# Patient Record
Sex: Female | Born: 1945 | Race: White | Hispanic: No | Marital: Single | Smoking: Former smoker
Health system: Southern US, Community
[De-identification: ages and names within clinical notes are randomized; demographics above are authoritative.]

## PROBLEM LIST (undated history)

## (undated) DIAGNOSIS — K5730 Diverticulosis of large intestine without perforation or abscess without bleeding: Secondary | ICD-10-CM

## (undated) DIAGNOSIS — R42 Dizziness and giddiness: Secondary | ICD-10-CM

## (undated) DIAGNOSIS — L820 Inflamed seborrheic keratosis: Secondary | ICD-10-CM

## (undated) DIAGNOSIS — I10 Essential (primary) hypertension: Secondary | ICD-10-CM

## (undated) DIAGNOSIS — Z86010 Personal history of colonic polyps: Secondary | ICD-10-CM

## (undated) DIAGNOSIS — H3530 Unspecified macular degeneration: Secondary | ICD-10-CM

## (undated) DIAGNOSIS — F329 Major depressive disorder, single episode, unspecified: Secondary | ICD-10-CM

## (undated) DIAGNOSIS — H9190 Unspecified hearing loss, unspecified ear: Secondary | ICD-10-CM

## (undated) DIAGNOSIS — R197 Diarrhea, unspecified: Secondary | ICD-10-CM

## (undated) DIAGNOSIS — J45909 Unspecified asthma, uncomplicated: Secondary | ICD-10-CM

## (undated) DIAGNOSIS — E785 Hyperlipidemia, unspecified: Secondary | ICD-10-CM

## (undated) DIAGNOSIS — E119 Type 2 diabetes mellitus without complications: Secondary | ICD-10-CM

## (undated) DIAGNOSIS — J984 Other disorders of lung: Secondary | ICD-10-CM

## (undated) DIAGNOSIS — M5137 Other intervertebral disc degeneration, lumbosacral region: Secondary | ICD-10-CM

## (undated) DIAGNOSIS — D759 Disease of blood and blood-forming organs, unspecified: Secondary | ICD-10-CM

## (undated) HISTORY — DX: Unspecified hearing loss, unspecified ear: H91.90

## (undated) HISTORY — DX: Dizziness and giddiness: R42

## (undated) HISTORY — DX: Essential (primary) hypertension: I10

## (undated) HISTORY — DX: Personal history of colonic polyps: Z86.010

## (undated) HISTORY — DX: Type 2 diabetes mellitus without complications: E11.9

## (undated) HISTORY — DX: Unspecified asthma, uncomplicated: J45.909

## (undated) HISTORY — DX: Unspecified macular degeneration: H35.30

## (undated) HISTORY — DX: Hyperlipidemia, unspecified: E78.5

## (undated) HISTORY — DX: Diarrhea, unspecified: R19.7

## (undated) HISTORY — DX: Other intervertebral disc degeneration, lumbosacral region: M51.37

## (undated) HISTORY — DX: Major depressive disorder, single episode, unspecified: F32.9

## (undated) HISTORY — DX: Inflamed seborrheic keratosis: L82.0

## (undated) HISTORY — DX: Diverticulosis of large intestine without perforation or abscess without bleeding: K57.30

---
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
## 1980-11-06 HISTORY — PX: CHOLECYSTECTOMY: SHX55

## 1993-11-06 HISTORY — PX: KNEE ARTHROSCOPY: SUR90

## 1994-11-06 HISTORY — PX: SPINE SURGERY: SHX786

## 1996-11-06 HISTORY — PX: JOINT REPLACEMENT: SHX530

## 1998-04-20 ENCOUNTER — Encounter: Admission: RE | Admit: 1998-04-20 | Discharge: 1998-07-19 | Payer: Self-pay | Admitting: Anesthesiology

## 1998-06-22 ENCOUNTER — Ambulatory Visit (HOSPITAL_COMMUNITY): Admission: RE | Admit: 1998-06-22 | Discharge: 1998-06-22 | Payer: Self-pay | Admitting: *Deleted

## 1999-04-15 ENCOUNTER — Other Ambulatory Visit: Admission: RE | Admit: 1999-04-15 | Discharge: 1999-04-15 | Payer: Self-pay | Admitting: *Deleted

## 2000-06-21 ENCOUNTER — Encounter: Admission: RE | Admit: 2000-06-21 | Discharge: 2000-08-16 | Payer: Self-pay | Admitting: Podiatry

## 2000-07-19 ENCOUNTER — Other Ambulatory Visit: Admission: RE | Admit: 2000-07-19 | Discharge: 2000-07-19 | Payer: Self-pay | Admitting: *Deleted

## 2001-01-11 ENCOUNTER — Other Ambulatory Visit: Admission: RE | Admit: 2001-01-11 | Discharge: 2001-01-11 | Payer: Self-pay | Admitting: *Deleted

## 2001-04-18 ENCOUNTER — Other Ambulatory Visit: Admission: RE | Admit: 2001-04-18 | Discharge: 2001-04-18 | Payer: Self-pay | Admitting: Internal Medicine

## 2001-04-18 ENCOUNTER — Encounter (INDEPENDENT_AMBULATORY_CARE_PROVIDER_SITE_OTHER): Payer: Self-pay | Admitting: Specialist

## 2001-07-25 ENCOUNTER — Other Ambulatory Visit: Admission: RE | Admit: 2001-07-25 | Discharge: 2001-07-25 | Payer: Self-pay | Admitting: *Deleted

## 2002-07-22 ENCOUNTER — Other Ambulatory Visit: Admission: RE | Admit: 2002-07-22 | Discharge: 2002-07-22 | Payer: Self-pay | Admitting: *Deleted

## 2002-07-26 ENCOUNTER — Emergency Department (HOSPITAL_COMMUNITY): Admission: EM | Admit: 2002-07-26 | Discharge: 2002-07-26 | Payer: Self-pay

## 2002-07-26 ENCOUNTER — Encounter: Payer: Self-pay | Admitting: Emergency Medicine

## 2002-09-02 ENCOUNTER — Encounter: Payer: Self-pay | Admitting: Neurosurgery

## 2002-09-02 ENCOUNTER — Ambulatory Visit (HOSPITAL_COMMUNITY): Admission: RE | Admit: 2002-09-02 | Discharge: 2002-09-02 | Payer: Self-pay | Admitting: Neurosurgery

## 2003-08-07 ENCOUNTER — Other Ambulatory Visit: Admission: RE | Admit: 2003-08-07 | Discharge: 2003-08-07 | Payer: Self-pay | Admitting: *Deleted

## 2003-11-07 ENCOUNTER — Encounter: Payer: Self-pay | Admitting: Family Medicine

## 2004-04-12 ENCOUNTER — Encounter: Payer: Self-pay | Admitting: Internal Medicine

## 2004-04-25 ENCOUNTER — Encounter: Admission: RE | Admit: 2004-04-25 | Discharge: 2004-04-25 | Payer: Self-pay | Admitting: Family Medicine

## 2005-04-05 ENCOUNTER — Ambulatory Visit: Payer: Self-pay | Admitting: Family Medicine

## 2005-04-25 ENCOUNTER — Ambulatory Visit: Payer: Self-pay | Admitting: Family Medicine

## 2005-05-16 ENCOUNTER — Encounter: Admission: RE | Admit: 2005-05-16 | Discharge: 2005-08-14 | Payer: Self-pay | Admitting: Family Medicine

## 2005-05-22 ENCOUNTER — Ambulatory Visit: Payer: Self-pay | Admitting: Family Medicine

## 2005-07-12 ENCOUNTER — Ambulatory Visit: Payer: Self-pay | Admitting: Family Medicine

## 2005-07-21 ENCOUNTER — Ambulatory Visit: Payer: Self-pay | Admitting: Family Medicine

## 2005-07-27 ENCOUNTER — Ambulatory Visit: Payer: Self-pay | Admitting: Family Medicine

## 2006-04-16 ENCOUNTER — Ambulatory Visit: Payer: Self-pay | Admitting: Family Medicine

## 2006-05-04 ENCOUNTER — Ambulatory Visit: Payer: Self-pay | Admitting: Family Medicine

## 2006-05-16 ENCOUNTER — Ambulatory Visit: Payer: Self-pay | Admitting: Family Medicine

## 2006-08-30 ENCOUNTER — Ambulatory Visit: Payer: Self-pay | Admitting: Family Medicine

## 2006-10-02 ENCOUNTER — Ambulatory Visit: Payer: Self-pay | Admitting: Family Medicine

## 2006-10-18 ENCOUNTER — Ambulatory Visit: Payer: Self-pay | Admitting: Family Medicine

## 2006-12-25 ENCOUNTER — Ambulatory Visit: Payer: Self-pay | Admitting: Family Medicine

## 2007-01-10 ENCOUNTER — Emergency Department (HOSPITAL_COMMUNITY): Admission: EM | Admit: 2007-01-10 | Discharge: 2007-01-10 | Payer: Self-pay | Admitting: Emergency Medicine

## 2007-01-11 ENCOUNTER — Ambulatory Visit: Payer: Self-pay | Admitting: Family Medicine

## 2007-01-11 LAB — CONVERTED CEMR LAB
BUN: 14 mg/dL (ref 6–23)
CO2: 34 meq/L — ABNORMAL HIGH (ref 19–32)
GFR calc Af Amer: 82 mL/min
GFR calc non Af Amer: 68 mL/min
Glucose, Bld: 137 mg/dL — ABNORMAL HIGH (ref 70–99)
Potassium: 5 meq/L (ref 3.5–5.1)
Sodium: 137 meq/L (ref 135–145)

## 2007-01-14 ENCOUNTER — Ambulatory Visit: Payer: Self-pay | Admitting: Cardiology

## 2007-01-18 ENCOUNTER — Ambulatory Visit (HOSPITAL_COMMUNITY): Admission: RE | Admit: 2007-01-18 | Discharge: 2007-01-18 | Payer: Self-pay | Admitting: Family Medicine

## 2007-01-19 ENCOUNTER — Emergency Department (HOSPITAL_COMMUNITY): Admission: EM | Admit: 2007-01-19 | Discharge: 2007-01-19 | Payer: Self-pay | Admitting: *Deleted

## 2007-01-22 ENCOUNTER — Ambulatory Visit: Payer: Self-pay | Admitting: Thoracic Surgery

## 2007-03-14 ENCOUNTER — Encounter: Payer: Self-pay | Admitting: Family Medicine

## 2007-03-14 DIAGNOSIS — I10 Essential (primary) hypertension: Secondary | ICD-10-CM

## 2007-03-14 DIAGNOSIS — J4489 Other specified chronic obstructive pulmonary disease: Secondary | ICD-10-CM | POA: Insufficient documentation

## 2007-03-14 DIAGNOSIS — F325 Major depressive disorder, single episode, in full remission: Secondary | ICD-10-CM

## 2007-03-14 DIAGNOSIS — F329 Major depressive disorder, single episode, unspecified: Secondary | ICD-10-CM

## 2007-03-14 DIAGNOSIS — E1159 Type 2 diabetes mellitus with other circulatory complications: Secondary | ICD-10-CM

## 2007-03-14 DIAGNOSIS — E119 Type 2 diabetes mellitus without complications: Secondary | ICD-10-CM | POA: Insufficient documentation

## 2007-03-14 DIAGNOSIS — F321 Major depressive disorder, single episode, moderate: Secondary | ICD-10-CM | POA: Insufficient documentation

## 2007-03-14 DIAGNOSIS — J45909 Unspecified asthma, uncomplicated: Secondary | ICD-10-CM

## 2007-03-14 DIAGNOSIS — I152 Hypertension secondary to endocrine disorders: Secondary | ICD-10-CM | POA: Insufficient documentation

## 2007-03-14 DIAGNOSIS — F3289 Other specified depressive episodes: Secondary | ICD-10-CM

## 2007-03-14 DIAGNOSIS — J4520 Mild intermittent asthma, uncomplicated: Secondary | ICD-10-CM | POA: Insufficient documentation

## 2007-03-14 DIAGNOSIS — H3530 Unspecified macular degeneration: Secondary | ICD-10-CM

## 2007-03-14 DIAGNOSIS — J449 Chronic obstructive pulmonary disease, unspecified: Secondary | ICD-10-CM | POA: Insufficient documentation

## 2007-03-14 HISTORY — DX: Essential (primary) hypertension: I10

## 2007-03-14 HISTORY — DX: Type 2 diabetes mellitus without complications: E11.9

## 2007-03-14 HISTORY — DX: Major depressive disorder, single episode, unspecified: F32.9

## 2007-03-14 HISTORY — DX: Other specified depressive episodes: F32.89

## 2007-03-14 HISTORY — DX: Unspecified asthma, uncomplicated: J45.909

## 2007-03-14 HISTORY — DX: Unspecified macular degeneration: H35.30

## 2007-03-27 ENCOUNTER — Ambulatory Visit: Payer: Self-pay | Admitting: Family Medicine

## 2007-03-27 LAB — CONVERTED CEMR LAB
Cholesterol: 188 mg/dL (ref 0–200)
Creatinine, Ser: 0.7 mg/dL (ref 0.4–1.2)
Creatinine,U: 181.6 mg/dL
Eosinophils Relative: 2 % (ref 0.0–5.0)
Glucose, Bld: 143 mg/dL — ABNORMAL HIGH (ref 70–99)
HCT: 43 % (ref 36.0–46.0)
HDL: 56.2 mg/dL (ref >39.0)
LDL Cholesterol: 107 mg/dL — ABNORMAL HIGH (ref 0–99)
Lymphocytes Relative: 26.9 % (ref 12.0–46.0)
Neutro Abs: 4.1 10*3/uL (ref 1.4–7.7)
Neutrophils Relative %: 62.1 % (ref 43.0–77.0)
Platelets: 206 10*3/uL (ref 150–400)
Sodium: 140 meq/L (ref 135–145)
TSH: 2.7 microintl units/mL (ref 0.35–5.50)
Triglycerides: 124 mg/dL (ref 0–149)
VLDL: 25 mg/dL (ref 0–40)
WBC: 6.5 10*3/uL (ref 4.5–10.5)

## 2007-03-28 ENCOUNTER — Ambulatory Visit: Payer: Self-pay | Admitting: Family Medicine

## 2007-04-03 ENCOUNTER — Encounter: Admission: RE | Admit: 2007-04-03 | Discharge: 2007-04-03 | Payer: Self-pay | Admitting: Thoracic Surgery

## 2007-04-03 ENCOUNTER — Ambulatory Visit: Payer: Self-pay | Admitting: Thoracic Surgery

## 2007-05-03 ENCOUNTER — Ambulatory Visit: Payer: Self-pay | Admitting: Infectious Diseases

## 2007-05-03 ENCOUNTER — Inpatient Hospital Stay (HOSPITAL_COMMUNITY): Admission: RE | Admit: 2007-05-03 | Discharge: 2007-05-08 | Payer: Self-pay | Admitting: Orthopedic Surgery

## 2007-05-09 ENCOUNTER — Telehealth: Payer: Self-pay | Admitting: Family Medicine

## 2007-05-31 ENCOUNTER — Ambulatory Visit: Payer: Self-pay | Admitting: Internal Medicine

## 2007-05-31 ENCOUNTER — Telehealth: Payer: Self-pay | Admitting: Internal Medicine

## 2007-05-31 DIAGNOSIS — R197 Diarrhea, unspecified: Secondary | ICD-10-CM | POA: Insufficient documentation

## 2007-05-31 HISTORY — DX: Diarrhea, unspecified: R19.7

## 2007-05-31 LAB — CONVERTED CEMR LAB
ALT: 13 units/L (ref 0–35)
Albumin: 3.8 g/dL (ref 3.5–5.2)
Alkaline Phosphatase: 133 units/L — ABNORMAL HIGH (ref 39–117)
BUN: 13 mg/dL (ref 6–23)
CO2: 27 meq/L (ref 19–32)
Chloride: 96 meq/L (ref 96–112)
Creatinine, Ser: 0.95 mg/dL (ref 0.40–1.20)
HCT: 39.9 % (ref 36.0–46.0)
Hemoglobin: 13.1 g/dL (ref 12.0–15.0)
MCHC: 32.7 g/dL (ref 30.0–36.0)
MCV: 86.7 fL (ref 78.0–100.0)
Platelets: 210 10*3/uL (ref 150–400)
Potassium: 4.2 meq/L (ref 3.5–5.3)
RBC: 4.61 M/uL (ref 3.87–5.11)
Total Bilirubin: 0.6 mg/dL (ref 0.3–1.2)
Total Protein: 6.1 g/dL (ref 6.0–8.3)

## 2007-06-02 ENCOUNTER — Inpatient Hospital Stay (HOSPITAL_COMMUNITY): Admission: AD | Admit: 2007-06-02 | Discharge: 2007-06-06 | Payer: Self-pay | Admitting: Internal Medicine

## 2007-06-02 ENCOUNTER — Ambulatory Visit: Payer: Self-pay | Admitting: Infectious Disease

## 2007-06-04 ENCOUNTER — Ambulatory Visit: Payer: Self-pay | Admitting: Internal Medicine

## 2007-06-18 ENCOUNTER — Ambulatory Visit: Payer: Self-pay | Admitting: Family Medicine

## 2007-07-22 ENCOUNTER — Inpatient Hospital Stay (HOSPITAL_COMMUNITY): Admission: RE | Admit: 2007-07-22 | Discharge: 2007-07-25 | Payer: Self-pay | Admitting: Orthopedic Surgery

## 2007-07-22 ENCOUNTER — Ambulatory Visit: Payer: Self-pay | Admitting: Internal Medicine

## 2007-07-25 ENCOUNTER — Ambulatory Visit: Payer: Self-pay | Admitting: Vascular Surgery

## 2007-07-25 ENCOUNTER — Encounter (INDEPENDENT_AMBULATORY_CARE_PROVIDER_SITE_OTHER): Payer: Self-pay | Admitting: Orthopedic Surgery

## 2007-10-08 ENCOUNTER — Telehealth: Payer: Self-pay | Admitting: Family Medicine

## 2007-10-23 ENCOUNTER — Ambulatory Visit: Payer: Self-pay | Admitting: Family Medicine

## 2007-10-30 ENCOUNTER — Encounter: Admission: RE | Admit: 2007-10-30 | Discharge: 2007-10-30 | Payer: Self-pay | Admitting: Thoracic Surgery

## 2007-10-30 ENCOUNTER — Ambulatory Visit: Payer: Self-pay | Admitting: Thoracic Surgery

## 2007-11-05 ENCOUNTER — Encounter: Payer: Self-pay | Admitting: Family Medicine

## 2007-12-11 ENCOUNTER — Telehealth: Payer: Self-pay | Admitting: Family Medicine

## 2007-12-12 ENCOUNTER — Telehealth: Payer: Self-pay | Admitting: Family Medicine

## 2008-03-12 ENCOUNTER — Ambulatory Visit: Payer: Self-pay | Admitting: Family Medicine

## 2008-03-12 LAB — CONVERTED CEMR LAB
AST: 18 units/L (ref 0–37)
Basophils Relative: 0.5 % (ref 0.0–1.0)
Bilirubin Urine: NEGATIVE
Bilirubin, Direct: 0.1 mg/dL (ref 0.0–0.3)
CO2: 31 meq/L (ref 19–32)
Calcium: 9.9 mg/dL (ref 8.4–10.5)
Chloride: 102 meq/L (ref 96–112)
Eosinophils Absolute: 0.2 10*3/uL (ref 0.0–0.7)
Eosinophils Relative: 3.2 % (ref 0.0–5.0)
GFR calc Af Amer: 82 mL/min
LDL Cholesterol: 84 mg/dL (ref 0–99)
Lymphocytes Relative: 30.9 % (ref 12.0–46.0)
MCHC: 33 g/dL (ref 30.0–36.0)
Microalb Creat Ratio: 17.3 mg/g (ref 0.0–30.0)
Microalb, Ur: 2.7 mg/dL — ABNORMAL HIGH (ref 0.0–1.9)
Monocytes Absolute: 0.6 10*3/uL (ref 0.1–1.0)
Monocytes Relative: 9.2 % (ref 3.0–12.0)
Neutrophils Relative %: 56.2 % (ref 43.0–77.0)
Potassium: 4.4 meq/L (ref 3.5–5.1)
Protein, U semiquant: NEGATIVE
RDW: 13.9 % (ref 11.5–14.6)
Sodium: 141 meq/L (ref 135–145)
Total Bilirubin: 0.9 mg/dL (ref 0.3–1.2)
Total CHOL/HDL Ratio: 3
Total Protein: 6.7 g/dL (ref 6.0–8.3)

## 2008-03-18 ENCOUNTER — Ambulatory Visit: Payer: Self-pay | Admitting: Family Medicine

## 2008-03-23 ENCOUNTER — Telehealth: Payer: Self-pay | Admitting: *Deleted

## 2008-03-26 ENCOUNTER — Telehealth: Payer: Self-pay | Admitting: Family Medicine

## 2008-03-27 ENCOUNTER — Telehealth (INDEPENDENT_AMBULATORY_CARE_PROVIDER_SITE_OTHER): Payer: Self-pay | Admitting: *Deleted

## 2008-03-27 ENCOUNTER — Telehealth: Payer: Self-pay | Admitting: *Deleted

## 2008-04-09 ENCOUNTER — Telehealth: Payer: Self-pay | Admitting: *Deleted

## 2008-04-29 ENCOUNTER — Encounter: Admission: RE | Admit: 2008-04-29 | Discharge: 2008-04-29 | Payer: Self-pay | Admitting: Thoracic Surgery

## 2008-04-30 ENCOUNTER — Ambulatory Visit: Payer: Self-pay | Admitting: Thoracic Surgery

## 2008-05-12 ENCOUNTER — Telehealth: Payer: Self-pay | Admitting: Family Medicine

## 2008-06-19 IMAGING — CT NM PET TUM IMG SKULL BASE T - THIGH
6 series · 25 of 25 positions shown · IV contrast (OM)
Comparison: Chest CT 01/14/2007

CLINICAL DATA: Left upper lobe nodule

PELVIS CT WITHOUT CONTRAST
TECHNIQUE: Multidetector CT imaging of the pelvis was performed following the
standard protocol without IV contrast.  Oral contrast was administered.

[Series 1: pet ac · axial · 3.3mm · 4.69mm/px · z∈[-914,-44]mm · 5 of 267 slices shown]
[im 1/267]
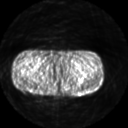
[im 67/267]
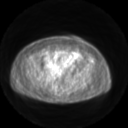
[im 134/267]
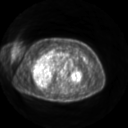
[im 200/267]
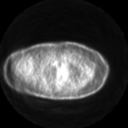
[im 267/267]
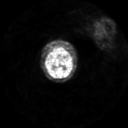

[Series 2: ct images · axial · 3.8mm · 0.98mm/px · z∈[-914,-44]mm · 6 of 266 slices shown]
[im 1/266]
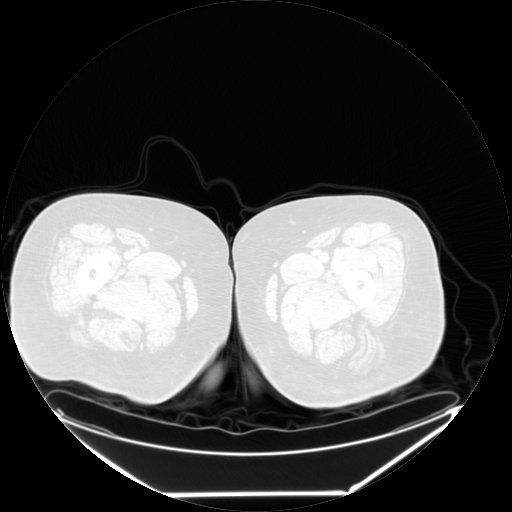
[im 54/266]
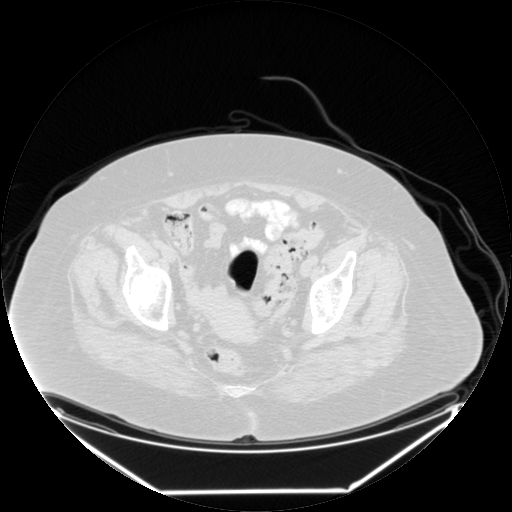
[im 107/266]
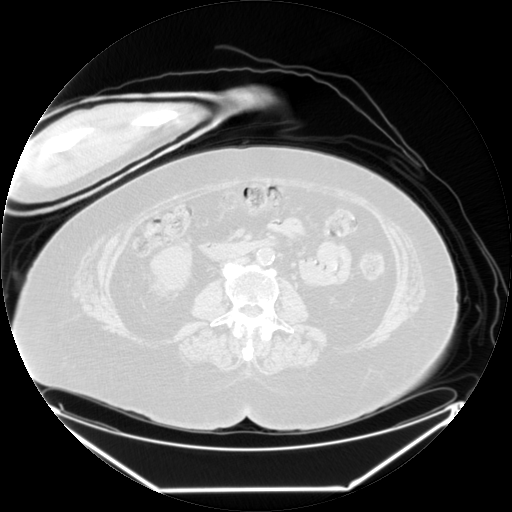
[im 160/266]
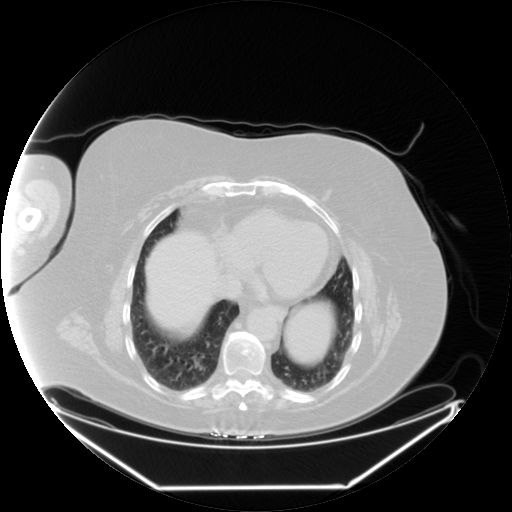
[im 213/266]
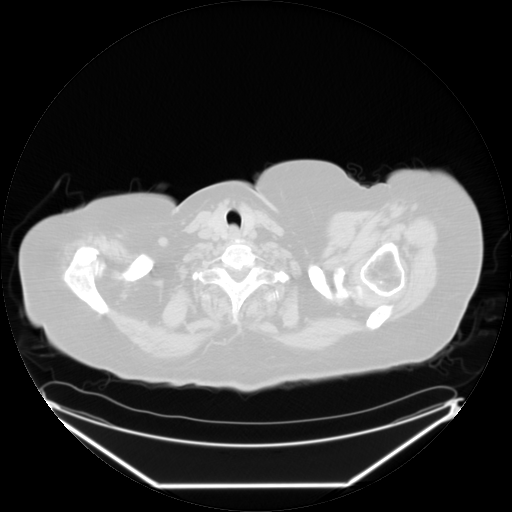
[im 266/266  brain]
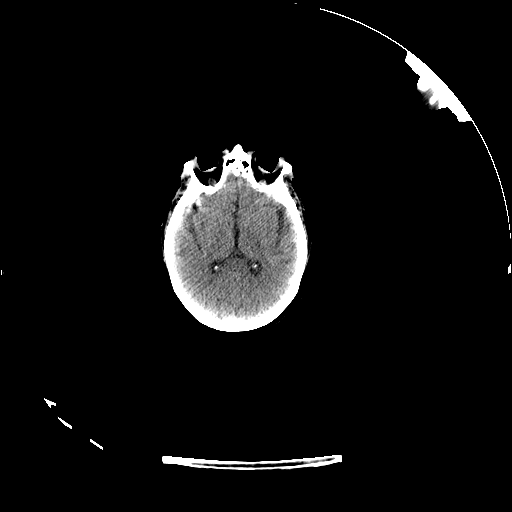

[Series 2: pet nac · axial · 3.3mm · 4.69mm/px · z∈[-914,-44]mm · 6 of 267 slices shown]
[im 1/267]
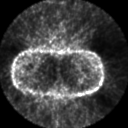
[im 54/267]
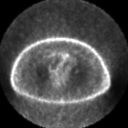
[im 107/267]
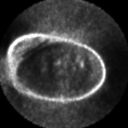
[im 160/267]
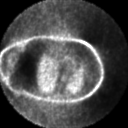
[im 213/267]
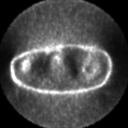
[im 267/267]
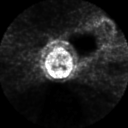

[Series 123: mip · coronal · 3.3mm · 4.69mm/px · 1 of 30 slices shown]
[im 1/30]
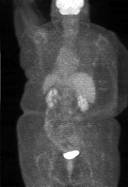

[Series 151: reformatted · axial · 3.3mm · 3.91mm/px · z∈[-904,-54]mm · 6 of 259 slices shown (1 of 2)]
[im 1/259]
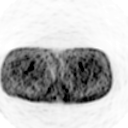
[im 52/259]
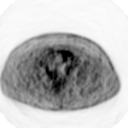
[im 104/259]
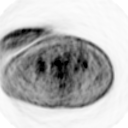
[im 155/259]
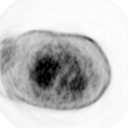
[im 207/259]
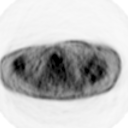
[im 259/259]
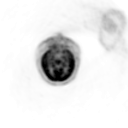

[Series 153: reformatted · coronal · 4.7mm · 6.98mm/px · 1 of 65 slices shown (2 of 2)]
[im 1/65]
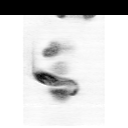

[25 of 25 positions shown; findings below may reference images not displayed]

FINDINGS: Again seen is the 1.2 cm posterior inferior left upper lobe nodule,
along the major fissure. This shows no significant abnormal FDG accumulation,
with a maximum SUV measuring 0.8, equivalent to background level. No abnormal
metabolic activity seen within the neck, chest, abdomen, or pelvis.

IMPRESSION

No significant increased metabolic activity with in the left upper lobe nodule.
Although this is encouraging, given its small size, I would recommend followup
with repeat chest CT in 6-12 months to assure stability in size.

## 2008-07-20 ENCOUNTER — Ambulatory Visit: Payer: Self-pay | Admitting: Family Medicine

## 2008-07-20 LAB — CONVERTED CEMR LAB
GFR calc Af Amer: 93 mL/min
Hgb A1c MFr Bld: 7.4 % — ABNORMAL HIGH (ref 4.6–6.0)

## 2008-07-24 ENCOUNTER — Ambulatory Visit: Payer: Self-pay | Admitting: Family Medicine

## 2008-08-10 ENCOUNTER — Telehealth (INDEPENDENT_AMBULATORY_CARE_PROVIDER_SITE_OTHER): Payer: Self-pay | Admitting: *Deleted

## 2008-09-01 ENCOUNTER — Telehealth: Payer: Self-pay | Admitting: Family Medicine

## 2008-09-21 ENCOUNTER — Telehealth: Payer: Self-pay | Admitting: Family Medicine

## 2008-09-29 ENCOUNTER — Telehealth: Payer: Self-pay | Admitting: *Deleted

## 2008-10-16 ENCOUNTER — Ambulatory Visit: Payer: Self-pay | Admitting: Family Medicine

## 2008-10-16 LAB — CONVERTED CEMR LAB
CO2: 30 meq/L (ref 19–32)
Calcium: 9.6 mg/dL (ref 8.4–10.5)
Chloride: 102 meq/L (ref 96–112)
Creatinine, Ser: 0.8 mg/dL (ref 0.4–1.2)
GFR calc Af Amer: 93 mL/min
GFR calc non Af Amer: 77 mL/min
Hgb A1c MFr Bld: 6.8 % — ABNORMAL HIGH (ref 4.6–6.0)

## 2008-10-21 ENCOUNTER — Ambulatory Visit: Payer: Self-pay | Admitting: Thoracic Surgery

## 2008-10-21 ENCOUNTER — Encounter: Admission: RE | Admit: 2008-10-21 | Discharge: 2008-10-21 | Payer: Self-pay | Admitting: Thoracic Surgery

## 2008-10-23 ENCOUNTER — Ambulatory Visit: Payer: Self-pay | Admitting: Family Medicine

## 2008-11-16 ENCOUNTER — Encounter: Payer: Self-pay | Admitting: Family Medicine

## 2009-01-11 DIAGNOSIS — K5730 Diverticulosis of large intestine without perforation or abscess without bleeding: Secondary | ICD-10-CM

## 2009-01-11 DIAGNOSIS — Z860100 Personal history of colon polyps, unspecified: Secondary | ICD-10-CM | POA: Insufficient documentation

## 2009-01-11 HISTORY — DX: Personal history of colon polyps, unspecified: Z86.0100

## 2009-01-11 HISTORY — DX: Diverticulosis of large intestine without perforation or abscess without bleeding: K57.30

## 2009-01-11 HISTORY — DX: Personal history of colonic polyps: Z86.010

## 2009-01-13 ENCOUNTER — Ambulatory Visit: Payer: Self-pay | Admitting: Internal Medicine

## 2009-02-19 ENCOUNTER — Telehealth: Payer: Self-pay | Admitting: Family Medicine

## 2009-02-26 ENCOUNTER — Ambulatory Visit: Payer: Self-pay | Admitting: Internal Medicine

## 2009-02-26 ENCOUNTER — Encounter: Payer: Self-pay | Admitting: Internal Medicine

## 2009-03-01 ENCOUNTER — Encounter: Payer: Self-pay | Admitting: Internal Medicine

## 2009-03-02 ENCOUNTER — Ambulatory Visit: Payer: Self-pay | Admitting: Family Medicine

## 2009-03-02 LAB — CONVERTED CEMR LAB
AST: 15 units/L (ref 0–37)
Alkaline Phosphatase: 105 units/L (ref 39–117)
Basophils Relative: 1.4 % (ref 0.0–3.0)
Bilirubin Urine: NEGATIVE
Calcium: 9.6 mg/dL (ref 8.4–10.5)
Cholesterol: 168 mg/dL (ref 0–200)
Creatinine, Ser: 0.8 mg/dL (ref 0.4–1.2)
Eosinophils Absolute: 0.2 10*3/uL (ref 0.0–0.7)
GFR calc non Af Amer: 77.06 mL/min (ref >60)
Glucose, Bld: 137 mg/dL — ABNORMAL HIGH (ref 70–99)
HCT: 41.8 % (ref 36.0–46.0)
Hemoglobin: 14.2 g/dL (ref 12.0–15.0)
Hgb A1c MFr Bld: 6.9 % — ABNORMAL HIGH (ref 4.6–6.5)
Lymphocytes Relative: 31.8 % (ref 12.0–46.0)
Lymphs Abs: 1.9 10*3/uL (ref 0.7–4.0)
MCHC: 34 g/dL (ref 30.0–36.0)
MCV: 90.3 fL (ref 78.0–100.0)
Neutro Abs: 3.1 10*3/uL (ref 1.4–7.7)
Potassium: 4.2 meq/L (ref 3.5–5.1)
RBC: 4.63 M/uL (ref 3.87–5.11)
RDW: 13 % (ref 11.5–14.6)
Sodium: 145 meq/L (ref 135–145)
Specific Gravity, Urine: 1.02 (ref 1.000–1.030)
Total Bilirubin: 0.9 mg/dL (ref 0.3–1.2)
Total Protein, Urine: NEGATIVE mg/dL
Total Protein: 6.5 g/dL (ref 6.0–8.3)
Triglycerides: 111 mg/dL (ref 0.0–149.0)
Urobilinogen, UA: 0.2 (ref 0.0–1.0)
WBC: 5.9 10*3/uL (ref 4.5–10.5)

## 2009-03-04 ENCOUNTER — Telehealth: Payer: Self-pay | Admitting: Internal Medicine

## 2009-03-08 ENCOUNTER — Encounter: Payer: Self-pay | Admitting: Family Medicine

## 2009-03-09 ENCOUNTER — Ambulatory Visit: Payer: Self-pay | Admitting: Family Medicine

## 2009-03-09 LAB — CONVERTED CEMR LAB: Microalb Creat Ratio: 11.2 mg/g (ref 0.0–30.0)

## 2009-03-15 ENCOUNTER — Telehealth: Payer: Self-pay | Admitting: Family Medicine

## 2009-05-25 ENCOUNTER — Ambulatory Visit: Payer: Self-pay | Admitting: Family Medicine

## 2009-05-25 DIAGNOSIS — M51379 Other intervertebral disc degeneration, lumbosacral region without mention of lumbar back pain or lower extremity pain: Secondary | ICD-10-CM

## 2009-05-25 DIAGNOSIS — M5137 Other intervertebral disc degeneration, lumbosacral region: Secondary | ICD-10-CM | POA: Insufficient documentation

## 2009-05-25 HISTORY — DX: Other intervertebral disc degeneration, lumbosacral region without mention of lumbar back pain or lower extremity pain: M51.379

## 2009-05-25 HISTORY — DX: Other intervertebral disc degeneration, lumbosacral region: M51.37

## 2009-06-30 ENCOUNTER — Ambulatory Visit: Payer: Self-pay | Admitting: Thoracic Surgery

## 2009-06-30 ENCOUNTER — Encounter: Admission: RE | Admit: 2009-06-30 | Discharge: 2009-06-30 | Payer: Self-pay | Admitting: Thoracic Surgery

## 2009-08-11 ENCOUNTER — Ambulatory Visit: Payer: Self-pay | Admitting: Family Medicine

## 2009-09-14 ENCOUNTER — Telehealth: Payer: Self-pay | Admitting: Family Medicine

## 2009-10-26 ENCOUNTER — Encounter: Payer: Self-pay | Admitting: *Deleted

## 2009-11-17 LAB — HM MAMMOGRAPHY: HM Mammogram: NORMAL

## 2009-11-25 ENCOUNTER — Encounter: Payer: Self-pay | Admitting: Family Medicine

## 2009-11-25 ENCOUNTER — Telehealth: Payer: Self-pay | Admitting: *Deleted

## 2009-11-30 ENCOUNTER — Telehealth: Payer: Self-pay | Admitting: Family Medicine

## 2009-12-02 ENCOUNTER — Telehealth: Payer: Self-pay | Admitting: Family Medicine

## 2009-12-03 ENCOUNTER — Ambulatory Visit: Payer: Self-pay | Admitting: Family Medicine

## 2009-12-04 ENCOUNTER — Encounter: Payer: Self-pay | Admitting: Family Medicine

## 2010-01-13 ENCOUNTER — Ambulatory Visit: Payer: Self-pay | Admitting: Family Medicine

## 2010-01-13 DIAGNOSIS — H9190 Unspecified hearing loss, unspecified ear: Secondary | ICD-10-CM

## 2010-01-13 HISTORY — DX: Unspecified hearing loss, unspecified ear: H91.90

## 2010-01-14 ENCOUNTER — Telehealth: Payer: Self-pay | Admitting: Family Medicine

## 2010-03-07 ENCOUNTER — Ambulatory Visit: Payer: Self-pay | Admitting: Family Medicine

## 2010-03-07 LAB — CONVERTED CEMR LAB
ALT: 17 units/L (ref 0–35)
Albumin: 3.7 g/dL (ref 3.5–5.2)
Basophils Absolute: 0 10*3/uL (ref 0.0–0.1)
Bilirubin, Direct: 0.2 mg/dL (ref 0.0–0.3)
Calcium: 9.6 mg/dL (ref 8.4–10.5)
Cholesterol: 174 mg/dL (ref 0–200)
Eosinophils Absolute: 0.1 10*3/uL (ref 0.0–0.7)
GFR calc non Af Amer: 67.05 mL/min (ref >60)
HCT: 38.3 % (ref 36.0–46.0)
HDL: 53.1 mg/dL (ref >39.00)
Hemoglobin: 13.4 g/dL (ref 12.0–15.0)
Ketones, ur: NEGATIVE mg/dL
LDL Cholesterol: 93 mg/dL (ref 0–99)
Lymphs Abs: 1.7 10*3/uL (ref 0.7–4.0)
Monocytes Absolute: 0.4 10*3/uL (ref 0.1–1.0)
Monocytes Relative: 8.5 % (ref 3.0–12.0)
Neutro Abs: 2.8 10*3/uL (ref 1.4–7.7)
Neutrophils Relative %: 54.8 % (ref 43.0–77.0)
Nitrite: NEGATIVE
Platelets: 140 10*3/uL — ABNORMAL LOW (ref 150.0–400.0)
RDW: 15.2 % — ABNORMAL HIGH (ref 11.5–14.6)
TSH: 2.28 microintl units/mL (ref 0.35–5.50)
Total Bilirubin: 0.9 mg/dL (ref 0.3–1.2)
Total Protein: 5.5 g/dL — ABNORMAL LOW (ref 6.0–8.3)
Triglycerides: 142 mg/dL (ref 0.0–149.0)
Urobilinogen, UA: 0.2 (ref 0.0–1.0)
VLDL: 28.4 mg/dL (ref 0.0–40.0)
pH: 5 (ref 5.0–8.0)

## 2010-03-14 ENCOUNTER — Ambulatory Visit: Payer: Self-pay | Admitting: Family Medicine

## 2010-03-17 LAB — HM DIABETES FOOT EXAM

## 2010-03-25 ENCOUNTER — Telehealth: Payer: Self-pay | Admitting: Family Medicine

## 2010-03-26 ENCOUNTER — Emergency Department (HOSPITAL_COMMUNITY): Admission: EM | Admit: 2010-03-26 | Discharge: 2010-03-26 | Payer: Self-pay | Admitting: Family Medicine

## 2010-04-06 ENCOUNTER — Ambulatory Visit: Payer: Self-pay | Admitting: Thoracic Surgery

## 2010-04-06 ENCOUNTER — Encounter: Admission: RE | Admit: 2010-04-06 | Discharge: 2010-04-06 | Payer: Self-pay | Admitting: Thoracic Surgery

## 2010-05-06 ENCOUNTER — Encounter: Admission: RE | Admit: 2010-05-06 | Discharge: 2010-05-06 | Payer: Self-pay | Admitting: Otolaryngology

## 2010-06-14 ENCOUNTER — Ambulatory Visit: Payer: Self-pay | Admitting: Family Medicine

## 2010-06-14 LAB — CONVERTED CEMR LAB
CO2: 29 meq/L (ref 19–32)
Calcium: 9.9 mg/dL (ref 8.4–10.5)
Creatinine, Ser: 0.9 mg/dL (ref 0.4–1.2)
GFR calc non Af Amer: 69.66 mL/min (ref >60)

## 2010-06-21 ENCOUNTER — Ambulatory Visit: Payer: Self-pay | Admitting: Family Medicine

## 2010-06-21 DIAGNOSIS — E785 Hyperlipidemia, unspecified: Secondary | ICD-10-CM

## 2010-06-21 DIAGNOSIS — E1169 Type 2 diabetes mellitus with other specified complication: Secondary | ICD-10-CM | POA: Insufficient documentation

## 2010-06-21 HISTORY — DX: Hyperlipidemia, unspecified: E78.5

## 2010-09-16 ENCOUNTER — Telehealth: Payer: Self-pay | Admitting: Family Medicine

## 2010-10-10 ENCOUNTER — Telehealth: Payer: Self-pay | Admitting: Family Medicine

## 2010-11-03 ENCOUNTER — Telehealth: Payer: Self-pay | Admitting: Family Medicine

## 2010-11-06 HISTORY — PX: OTHER SURGICAL HISTORY: SHX169

## 2010-11-10 ENCOUNTER — Ambulatory Visit
Admission: RE | Admit: 2010-11-10 | Discharge: 2010-11-10 | Payer: Self-pay | Source: Home / Self Care | Attending: Family Medicine | Admitting: Family Medicine

## 2010-11-10 DIAGNOSIS — R42 Dizziness and giddiness: Secondary | ICD-10-CM | POA: Insufficient documentation

## 2010-11-10 HISTORY — DX: Dizziness and giddiness: R42

## 2010-11-17 LAB — HM DIABETES EYE EXAM

## 2010-12-02 ENCOUNTER — Ambulatory Visit
Admission: RE | Admit: 2010-12-02 | Discharge: 2010-12-02 | Payer: Self-pay | Source: Home / Self Care | Attending: Family Medicine | Admitting: Family Medicine

## 2010-12-02 ENCOUNTER — Other Ambulatory Visit: Payer: Self-pay | Admitting: Family Medicine

## 2010-12-02 DIAGNOSIS — L820 Inflamed seborrheic keratosis: Secondary | ICD-10-CM

## 2010-12-02 HISTORY — DX: Inflamed seborrheic keratosis: L82.0

## 2010-12-05 ENCOUNTER — Encounter: Payer: Self-pay | Admitting: Family Medicine

## 2010-12-06 NOTE — Miscellaneous (Signed)
Summary: mammogram  Clinical Lists Changes  Observations: Added new observation of MAMMOGRAM: normal (11/22/2009 15:10)      Preventive Care Screening  Mammogram:    Date:  11/22/2009    Results:  normal

## 2010-12-06 NOTE — Progress Notes (Signed)
Summary: note for jury duty  Phone Note Call from Patient   Caller: Patient Call For: [name] MD Summary of Call: Pt would like Dr. Tawanna Cooler to write a letter for this pt dismissing her from jury duty......Marland KitchenMarland KitchenShe states she cannot sit for long periods due to her legs and back.  Please let her know if we can do this, and if she can pick it up? [phone] Initial call taken by: Lynann Beaver CMA,  November 25, 2009 10:22 AM  Follow-up for Phone Call        ok Follow-up by: [name] MD,  November 25, 2009 10:29 AM

## 2010-12-06 NOTE — Letter (Signed)
Summary: Generic Letter  [place] [facility]  [address] [place], [place] [address]   Phone: [phone]  Fax: [phone]    11/25/2009  [facility] [address] [place], [place]  [address]  To Whom it May Concern,  Due to a medical condition, our patient has difficulties sitting for long periods.  If possible we would like for Ms Bove to be excused from jury duty.  If you have any questions or concerns please feel free to call our office.          Sincerely,   Kelle Darting, MD

## 2010-12-06 NOTE — Assessment & Plan Note (Signed)
Summary: ear trouble/njr   Vital Signs:  Patient profile:   65 year old female Temp:     98.7 degrees F oral BP sitting:   134 / 86  (left arm) Cuff size:   large  Vitals Entered By: Alfred Levins, CMA (January 13, 2010 4:13 PM) CC: rt ear clogged   CC:  rt ear clogged.  History of Present Illness: Lynn [name] is a 65 year old female, single, retired Engineer, civil (consulting), who comes in today for evaluation of hearing loss in her right ear for two weeks.  He states that two weeks ago she began having some decreased hearing in her right ear.  She thought it might be related to allergy.  Therefore, she tried Zyrtec, didn't help.  She's also had a roaring sensation in her right ear.  It comes and goes, but no vertigo.  No history of ear trauma, noise trauma, etc. pain.  Neurologic review of systems negative  Allergies: 1)  ! * Generic Lisinopril 2)  * Generic Ambien  Past History:  Past medical, surgical, family and social histories (including risk factors) reviewed, and no changes noted (except as noted below).  Past Medical History: Reviewed history from 01/11/2009 and no changes required. 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 Cholecystectomy Tonsillectomy CERVICAL SPINE FUSION-RUPTURED DISC Right knee arthroscopy Right knee replacement cervical laser x 2  Family History: Reviewed history from 01/11/2009 and no changes required. Family History of CAD Female 1st degree relative <50 Family History Diabetes 1st degree relative:  Grandmother Family History High cholesterol Family History Hypertension No FH of Colon Cancer: Maternal Aunt, Maternal cousin  Social History: Reviewed history from 10/23/2007 and no changes required. Occupation: Charity fundraiser Single Former Smoker Alcohol use-yes Drug use-no Regular exercise-yes  Review of Systems      See HPI  Physical Exam  General:  Well-developed,well-nourished,in no acute distress; alert,appropriate and cooperative throughout examination Head:  Normocephalic and atraumatic without obvious abnormalities. No apparent alopecia or balding. Eyes:  No corneal or conjunctival inflammation noted. EOMI. Perrla. Funduscopic exam benign, without hemorrhages, exudates or papilledema. Vision grossly normal. Ears:  External ear exam shows no significant lesions or deformities.  Otoscopic examination reveals clear canals, tympanic membranes are intact bilaterally without bulging, retraction, inflammation or discharge. Hearing is grossly normal bilaterally. Nose:  External nasal examination shows no deformity or inflammation. Nasal mucosa are pink and moist without lesions or exudates. Mouth:  Oral mucosa and oropharynx without lesions or exudates.  Teeth in good repair.   Impression & Recommendations:  Problem # 1:  UNSPECIFIED HEARING LOSS (ICD-389.9) Assessment New  Complete Medication List: 1)  Citalopram Hydrobromide 20 Mg Tabs (Citalopram hydrobromide) .... Take 1 tablet by mouth once a day 2)  Lipitor 40 Mg Tabs (Atorvastatin calcium) .... Take 1  tablet by mouth every morning 3)  Lisinopril-hydrochlorothiazide 20-25 Mg Tabs (Lisinopril-hydrochlorothiazide) .... Take 1  tablet by mouth once a day 4)  Bayer Low Strength 81 Mg Tbec (Aspirin) .... One tablet daily 5)  Grapeseed Extract 500-50 Mg Caps (Nutritional supplements) .... Once daily 6)  Onetouch Ultra Test Strp (Glucose blood) .... Two times a day 7)  Glucophage 500 Mg Tabs (Metformin hcl) .Marland KitchenMarland KitchenMarland Kitchen  One tab once daily 8)   Vitamin D 1000 Unit Tabs (Cholecalciferol) .... 2 by mouth once daily 9)  Docusate Sodium 100 Mg Caps (Docusate sodium) .... As needed 10)  Senna 187 Mg Tabs (Senna) .... 1/2 as needed 11)  Ocuvite Preservision Tabs (Multiple vitamins-minerals) .... Once daily 12)  Ambien 5 Mg Tabs (Zolpidem tartrate) .... Take one tab at bedtime 13)  Prednisone 20 Mg Tabs (Prednisone) .... Uad 14)  Vicodin Es 7.5-750 Mg Tabs (Hydrocodone-acetaminophen) .... Take 1 tablet by mouth three times a day 15)  Flexeril 10 Mg Tabs (Cyclobenzaprine hcl) .... Take 1 tablet by mouth three times a day 16)  Metronidazole 500 Mg Tabs (Metronidazole) .... Take 1 tablet by mouth three times a day  Patient Instructions: 1)  call Dr. Narda Bonds, ENT, for consult tomorrow

## 2010-12-06 NOTE — Progress Notes (Signed)
Summary: diarrhea  Phone Note Call from Patient   Summary of Call: Pt was on Clindamycin in December and has had diarrhea since December after a dental procedure.  Immodium has not helped, and wants Lomotil. [facility] [phone]  Has diarrhea after every meal.  No fever or illness. Initial call taken by: Lynann Beaver CMA,  November 30, 2009 1:25 PM  Follow-up for Phone Call        Fleet Contras please call [name] she could have Clostridium difficile colitis from the antibiotics.   Follow-up by: [name] MD,  November 30, 2009 1:37 PM     Appended Document: Orders Update    Clinical Lists Changes  Orders: Added new Test order of T- * Misc. Laboratory test [phone]) - Signed Added new Test order of T- * Misc. Laboratory test [phone]) - Signed

## 2010-12-06 NOTE — Progress Notes (Signed)
Summary: refill Valium  Phone Note Call from Patient   Caller: Patient Call For: [name] MD Summary of Call: Pt had a steroid injection in shoulder last week and BS are elevated up to 158       Taking Metformin 500mg  in the am and 250 mg.  Running around 149  Has torn rotator cuff, bursitits, and getting a MRI.  Also has a bone spur that [place] causing the tear.  Surgery in January.  Since Friday, she has been a little manic as usual and is using 2 mg of Valium to calm down.  Is asking for more Valium 2 mg. Val[facility].....[facility] Initial call taken by: [facility] CMA AAMA,  October 10, 2010 8:49 AM  Follow-up for Phone Call        Valium 2 mg, dispense 60 tabs directions one p.o. b.i.d. p.r.n. refills x 2 Follow-up by: [name] MD,  October 10, 2010 10:37 AM    New/Updated Medications: VALIUM 2 MG TABS (DIAZEPAM) one by mouth two times a day as needed Prescriptions: VALIUM 2 MG TABS (DIAZEPAM) one by mouth two times a day as needed  #60 x 2   Entered by:   Lynann Beaver CMA AAMA   Authorized by:   [name] MD   Signed by:   Lynann Beaver CMA AAMA on 10/10/2010   Method used:   Telephoned to ...       [id number] ([facility]       [address]       [place], [place]  [address]       Ph: [phone] or [phone]       Fax: [phone]   RxID:   [id number]

## 2010-12-06 NOTE — Progress Notes (Signed)
Summary: triam cream rx  Phone Note Call from Patient   Summary of Call: patient is calling for a rx for triam cream  Initial call taken by: Kern Reap CMA Duncan Dull),  Mar 25, 2010 1:45 PM    New/Updated Medications: TRIAMCINOLONE ACETONIDE 0.1 % CREA (TRIAMCINOLONE ACETONIDE) apply to area two times a day Prescriptions: TRIAMCINOLONE ACETONIDE 0.1 % CREA (TRIAMCINOLONE ACETONIDE) apply to area two times a day  #1 pound jar x 3   Entered by:   Kern Reap CMA (AAMA)   Authorized by:   [name] MD   Signed by:   Kern Reap CMA (AAMA) on 03/25/2010   Method used:   Electronically to        [facility]* (mail-order)             ,          Ph: [phone]       Fax: [phone]   RxID:   [id number]

## 2010-12-06 NOTE — Assessment & Plan Note (Signed)
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  Around December, the 14th she went to see her dentist for an infected tooth.  They started her on Cleocin t.i.d. for two weeks.  After 3 or 4 days of the medication.  She began having diarrhea.  She continued the medicine.  She went back to see her dentist however, he was gone, and she saw another dentist.  He re-prescribe more Cleocin.  She neglected to tell him that she was having diarrhea from the first prescription.  Three days after starting his second round of Cleocin.  She began having diarrhea again.  She then stopped the medication.  The diarrhea seemed to abate the last 48 hours.  His come back.  She has gas, but no fever, vomiting, or abdominal pain.  Allergies: 1)  ! * Generic Lisinopril 2)  * Generic Ambien  Past History:  Past medical, surgical, family and social histories (including risk factors) reviewed, and no changes noted (except as noted below).  Past Medical History: Reviewed history from 01/11/2009 and no changes required. 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 Cholecystectomy Tonsillectomy CERVICAL SPINE FUSION-RUPTURED DISC Right knee arthroscopy Right knee  replacement cervical laser x 2  Family History: Reviewed history from 01/11/2009 and no changes required. Family History of CAD Female 1st degree relative <50 Family History Diabetes 1st degree relative: Grandmother Family History High cholesterol Family History Hypertension No FH of Colon Cancer: Maternal Aunt, Maternal cousin  Social History: Reviewed history from 10/23/2007 and no changes required. Occupation: Charity fundraiser Single Former Smoker Alcohol use-yes Drug use-no Regular exercise-yes  Review of Systems      See HPI  Physical Exam  General:  Well-developed,well-nourished,in no acute distress; alert,appropriate and cooperative throughout examination Abdomen:  Bowel sounds positive,abdomen soft and non-tender without masses, organomegaly or hernias noted.   Impression & Recommendations:  Problem # 1:  DIARRHEA (ICD-787.91) Assessment Deteriorated  Orders: Prescription Created Electronically [phone])  Complete Medication List: 1)  Citalopram Hydrobromide 20 Mg Tabs (Citalopram hydrobromide) .... Take 1 tablet by mouth once a day 2)  Lipitor 40 Mg Tabs (Atorvastatin calcium) .... Take 1  tablet by mouth every morning 3)  Lisinopril-hydrochlorothiazide 20-25 Mg Tabs (Lisinopril-hydrochlorothiazide) .... Take 1  tablet by mouth once a day 4)  Bayer Low Strength 81 Mg Tbec (Aspirin) .... One tablet daily 5)  Grapeseed Extract 500-50 Mg Caps (Nutritional supplements) .... Once daily 6)  Onetouch Ultra Test Strp (Glucose blood) .... Two times a day 7)  Glucophage 500 Mg Tabs (Metformin hcl) .... One tab once daily 8)  Vitamin D 1000 Unit Tabs (Cholecalciferol) .... 2 by mouth once daily 9)  Docusate Sodium 100 Mg Caps (Docusate sodium) .... As needed 10)  Senna 187 Mg Tabs (Senna) .... 1/2 as needed 11)  Ocuvite Preservision Tabs (  Multiple vitamins-minerals) .... Once daily 12)  Ambien 5 Mg Tabs (Zolpidem tartrate) .... Take one tab at bedtime 13)  Prednisone 20 Mg Tabs  (Prednisone) .... Uad 14)  Vicodin Es 7.5-750 Mg Tabs (Hydrocodone-acetaminophen) .... Take 1 tablet by mouth three times a day 15)  Flexeril 10 Mg Tabs (Cyclobenzaprine hcl) .... Take 1 tablet by mouth three times a day 16)  Metronidazole 500 Mg Tabs (Metronidazole) .... Take 1 tablet by mouth three times a day  Patient Instructions: 1)  avoid all caffeine, and fatty foods. 2)  Begin Flagyl 500 mg 3 times a day.  I will call you when I get  her culture report back Prescriptions: METRONIDAZOLE 500 MG TABS (METRONIDAZOLE) Take 1 tablet by mouth three times a day  #50 x 1   Entered and Authorized by:   [name] MD   Signed by:   [name] MD on 12/03/2009   Method used:   Electronically to        [id number]       [address]       [place], [place]  [address]       Ph: [phone] or [phone]       Fax: [phone]   RxID:   [id number]

## 2010-12-06 NOTE — Progress Notes (Signed)
Summary: 90 day rx  Phone Note Refill Request Message from:  Patient  Refills Requested: Medication #1:  AMBIEN 5 MG TABS take one tab at bedtime pt needs 90 day supply call into [facility] [phone]  Initial call taken by: Heron Sabins,  September 16, 2010 10:26 AM    Prescriptions: AMBIEN 5 MG TABS (ZOLPIDEM TARTRATE) take one tab at bedtime  #90 x 3   Entered by:   Kern Reap CMA (AAMA)   Authorized by:   [name] MD   Signed by:   Kern Reap CMA (AAMA) on 09/16/2010   Method used:   Printed then faxed to ...       [facility] [phone]* (retail)       [address] [place], [place]  [address]       Ph: [phone]       Fax: [phone]   RxID:   [id number]

## 2010-12-06 NOTE — Assessment & Plan Note (Signed)
Summary: 3 month rov/njr   Vital Signs:  Patient profile:   65 year old female Weight:      208 pounds Temp:     98 degrees F BP sitting:   116 / 74  (left arm) Cuff size:   regular  Vitals Entered By: Kern Reap CMA Duncan Dull) (June 21, 2010 9:12 AM) CC: follow-up visit   CC:  follow-up visit.  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  She takes Glucophage 500 mg q.a.m. fasting blood sugar 130 hemoglobin A1c6 .8%.  [facility] and does water aerobics 3 times per week and walks the days.  She does not swim.  Her weight has dropped from 235, down to 208.  She was on simvastatin 80 nightly.  We discussed therapeutic alternatives.  Will go to Lipitor 40.  The simvastatin did not get her lipids to go.  She recently fell, had a contusion to her right lower extremity and left arm.  No fractures.  Allergies: 1)  ! * Generic Lisinopril 2)  * Generic Ambien  Past History:  Past medical, surgical, family and social histories (including risk factors) reviewed for relevance to current acute and chronic problems.  Past Medical History: Reviewed history from 01/11/2009 and no changes required. 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 Cholecystectomy Tonsillectomy CERVICAL SPINE FUSION-RUPTURED DISC Right knee arthroscopy Right knee replacement cervical laser x 2  Family History: Reviewed history from 01/11/2009  and no changes required. Family History of CAD Female 1st degree relative <50 Family History Diabetes 1st degree relative: Grandmother Family History High cholesterol Family History Hypertension No FH of Colon Cancer: Maternal Aunt, Maternal cousin  Social History: Reviewed history from 10/23/2007 and no changes required. Occupation: Charity fundraiser Single Former Smoker Alcohol use-yes Drug use-no Regular exercise-yes  Review of Systems      See HPI  Physical Exam  General:  Well-developed,well-nourished,in no acute distress; alert,appropriate and cooperative throughout examination   Problems:  Medical Problems Added: 1)  Dx of Hyperlipidemia  (ICD-272.4)  Impression & Recommendations:  Problem # 1:  DIABETES MELLITUS, TYPE II (ICD-250.00) Assessment Improved  Her updated medication list for this problem includes:    Lisinopril-hydrochlorothiazide 20-25 Mg Tabs (Lisinopril-hydrochlorothiazide) .Marland Kitchen... Take 1  tablet by mouth once a day    Bayer Low Strength 81 Mg Tbec (Aspirin) ..... One tablet daily    Glucophage 500 Mg Tabs (Metformin hcl) ..... One tab once daily  Problem # 2:  HYPERLIPIDEMIA (ICD-272.4) Assessment: Deteriorated  The following medications were removed from the medication list:    Simvastatin 80 Mg Tabs (Simvastatin) .Marland Kitchen... 1 tab @ bedtime Her updated medication list for this problem includes:    Lipitor 40 Mg Tabs (Atorvastatin calcium) .Marland Kitchen... 1 tab @ bedtime  Complete Medication List: 1)  Citalopram Hydrobromide 20 Mg Tabs (Citalopram hydrobromide) .... Take 1 tablet by mouth once a day 2)  Lisinopril-hydrochlorothiazide 20-25 Mg Tabs (Lisinopril-hydrochlorothiazide) .... Take 1  tablet by mouth once a day 3)  Bayer Low Strength 81 Mg Tbec (Aspirin) .... One tablet daily 4)  Onetouch Ultra Test Strp (Glucose blood) .... Two times a day 5)  Glucophage 500 Mg Tabs (Metformin hcl) .... One tab once daily 6)  Vitamin D 1000 Unit Tabs (Cholecalciferol) .... 2 by  mouth once daily 7)  Docusate Sodium 100 Mg Caps (Docusate sodium) .... As needed 8)  Senna 187 Mg Tabs (Senna) .... 1/2 as needed 9)  Ocuvite Preservision Tabs (Multiple vitamins-minerals) .... Once daily 10)  Ambien 5 Mg Tabs (Zolpidem tartrate) .... Take one tab at bedtime 11)  Triamcinolone Acetonide 0.1 % Crea (Triamcinolone acetonide) .... Apply to area two times a day 12)  Lipitor 40 Mg Tabs (Atorvastatin calcium) .Marland Kitchen.. 1 tab @ bedtime 13)  Vicodin Es 7.5-750 Mg Tabs (Hydrocodone-acetaminophen) .... Take 1 tablet by mouth three times a day as needed pain  Other Orders: Tdap => 83yrs IM (51025) Admin 1st Vaccine (85277)  Patient Instructions: 1)  continue your diabetic treatment program. 2)  Stop the simvastatin 80 mg and change to Lipitor 40 3)  Please schedule a follow-up appointment in 6 months.Marland Kitchen..250.00 4)  BMP prior to visit, ICD-9: 5)  HbgA1C prior to visit, ICD-9: Prescriptions: VICODIN ES 7.5-750 MG TABS (HYDROCODONE-ACETAMINOPHEN) Take 1 tablet by mouth three times a day as needed pain  #50 x 1   Entered and Authorized by:   [name] MD   Signed by:   [name] MD on 06/21/2010   Method used:   Print then Give to Patient   RxID:   [id number] LIPITOR 40 MG TABS (ATORVASTATIN CALCIUM) 1 tab @ bedtime  #100 x 3   Entered and Authorized by:   [name] MD   Signed by:   [name] MD on 06/21/2010   Method used:   Print then Give to Patient   RxID:   [id number]    Immunizations Administered:  Tetanus Vaccine:    Vaccine Type: Tdap    Site: right deltoid    Mfr: Gl[employer]oSmithK[employer]    Dose: 0.5 ml    Route: IM    Given by: Kern Reap CMA (AAMA)    Exp. Date: 08/25/2012    Lot #: [id number]    Physician counseled: yes

## 2010-12-06 NOTE — Progress Notes (Signed)
Summary: ? about ENT appt?  Phone Note Call from Patient   Caller: Patient Call For: [name] MD Summary of Call: Pt made appt with Dr. Ezzard Standing on 01/25/2010, and wants to know if Dr. Tawanna Cooler thinks this is soon enough.? [phone] Initial call taken by: Lynann Beaver CMA,  January 14, 2010 11:29 AM  Follow-up for Phone Call        Fleet Contras, please call and see if you can get her in sooner Follow-up by: [name] MD,  January 14, 2010 12:13 PM  Additional Follow-up for Phone Call Additional follow up Details #1::        dr Ezzard Standing will be out of town.  patient is aware. Additional Follow-up by: Kern Reap CMA Duncan Dull),  January 17, 2010 2:18 PM

## 2010-12-06 NOTE — Assessment & Plan Note (Signed)
Summary: CPX // RS   Vital Signs:  Patient profile:   65 year old female Temp:     98.6 degrees F oral BP sitting:   120 / 80  (left arm) Cuff size:   regular  Vitals Entered By: Kern Reap CMA Duncan Dull) (Mar 14, 2010 2:02 PM) CC: cpx   CC:  cpx.  History of Present Illness: Lynn [name] is a 65 year old single female, retired Engineer, civil (consulting), who comes in today for evaluation of multiple issues.  She takes Celexa 20 mg nightly for mild depression.  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  That caused hearing loss for same with the steroids, and the antiviral medication.  Hearing is back to normal.  She takes lisinopril 20 -25 q. a.m. for hypertension, BP 2020 over 80.  She takes Ambien 5 mg nightly for sleep.  We will switch her from Lipitor 40 to Zocor 80 for hyperlipidemia.  Tetanus 2004, seasonal flu 2010, Pneumovax 2006, shingles 2009.  She gets routine eye care.  Dental care.  Mammography.  She does not do BSE monthly.  Paps by GYN because she said dysplasia.  Colonoscopy normal  Allergies: 1)  ! * Generic Lisinopril 2)  * Generic Ambien  Past History:  Past medical, surgical, family and social histories (including risk factors) reviewed, and no changes noted (except as noted below).  Past Medical History: Reviewed history from 01/11/2009 and no changes required. 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 Cholecystectomy Tonsillectomy CERVICAL SPINE FUSION-RUPTURED DISC Right knee arthroscopy Right knee replacement cervical laser x 2  Family History: Reviewed history from 01/11/2009 and no changes required. Family History of CAD Female 1st degree relative <50 Family History Diabetes 1st degree relative: Grandmother Family History High cholesterol Family History Hypertension No FH of Colon Cancer: Maternal Aunt, Maternal cousin  Social History: Reviewed history from 10/23/2007 and no changes required. Occupation: Charity fundraiser Single Former Smoker Alcohol use-yes Drug use-no Regular exercise-yes  Review of Systems      See HPI  Physical Exam  General:  Well-developed,well-nourished,in no acute distress; alert,appropriate and cooperative throughout examination Head:  Normocephalic and atraumatic without obvious abnormalities. No apparent alopecia or balding. Eyes:  No corneal or conjunctival inflammation noted. EOMI. Perrla. Funduscopic exam benign, without hemorrhages, exudates or papilledema. Vision grossly normal. Ears:  External ear exam shows no significant lesions or deformities.  Otoscopic examination reveals clear canals, tympanic membranes are intact bilaterally without bulging, retraction, inflammation or discharge. Hearing is grossly normal bilaterally. Nose:  External nasal examination shows no deformity or inflammation. Nasal mucosa are pink and moist without lesions or exudates. Mouth:  Oral mucosa and oropharynx without lesions or exudates.  Teeth in good repair. Neck:  No deformities, masses, or tenderness noted. Chest Wall:  No deformities, masses, or tenderness noted. Breasts:  No mass, nodules, thickening, tenderness, bulging, retraction, inflamation, nipple discharge or skin changes noted.   Lungs:  Normal respiratory effort, chest expands symmetrically. Lungs are clear to auscultation, no crackles or wheezes. Heart:  Normal rate and regular rhythm. S1 and S2  normal without gallop,  murmur, click, rub or other extra sounds. Msk:  No deformity or scoliosis noted of thoracic or lumbar spine.   Pulses:  R and L carotid,radial,femoral,dorsalis pedis and posterior tibial pulses are full and equal bilaterally Extremities:  No clubbing, cyanosis, edema, or deformity noted with normal full range of motion of all joints.   Neurologic:  No cranial nerve deficits noted. Station and gait are normal. Plantar reflexes are down-going bilaterally. DTRs are symmetrical throughout. Sensory, motor and coordinative functions appear intact. Skin:  Intact without suspicious lesions or rashes Cervical Nodes:  No lymphadenopathy noted Axillary Nodes:  No palpable lymphadenopathy Inguinal Nodes:  No significant adenopathy Psych:  Cognition and judgment appear intact. Alert and cooperative with normal attention span and concentration. No apparent delusions, illusions, hallucinations  Diabetes Management Exam:    Foot Exam (with socks and/or shoes not present):       Sensory-Pinprick/Light touch:          Left medial foot (L-4): normal          Left dorsal foot (L-5): normal          Left lateral foot (S-1): normal          Right medial foot (L-4): normal          Right dorsal foot (L-5): normal          Right lateral foot (S-1): normal       Sensory-Monofilament:          Left foot: normal          Right foot: normal       Inspection:          Left foot: normal          Right foot: normal       Nails:          Left foot: normal          Right foot: normal    Eye Exam:       Eye Exam done elsewhere          Date: 02/18/2010          Results: normal          Done by: opth   Impression & Recommendations:  Problem # 1:  HYPERTENSION (ICD-401.9) Assessment Improved  Her updated medication list for this problem includes:    Lisinopril-hydrochlorothiazide 20-25 Mg Tabs (Lisinopril-hydrochlorothiazide) .Marland Kitchen... Take 1  tablet by mouth once a  day  Orders: Prescription Created Electronically [phone]) EKG w/ Interpretation (93000)  Problem # 2:  DIABETES MELLITUS, TYPE II (ICD-250.00) Assessment: Deteriorated  Her updated medication list for this problem includes:    Lisinopril-hydrochlorothiazide 20-25 Mg Tabs (Lisinopril-hydrochlorothiazide) .Marland Kitchen... Take 1  tablet by mouth once a day    Bayer Low Strength 81 Mg Tbec (Aspirin) ..... One tablet daily    Glucophage 500 Mg Tabs (Metformin hcl) ..... One tab once daily  Orders: Prescription Created Electronically [phone]) EKG w/ Interpretation (93000)  Problem # 3:  DEPRESSION (ICD-311) Assessment: Improved  Her updated medication list for this problem includes:    Citalopram Hydrobromide 20 Mg Tabs (Citalopram hydrobromide) .Marland Kitchen... Take 1 tablet by mouth once a day  Orders: Prescription Created Electronically [phone])  Problem # 4:  COPD (ICD-496) Assessment: Unchanged  Orders: Prescription Created Electronically [phone]) EKG w/ Interpretation (93000)  Complete Medication List: 1)  Citalopram Hydrobromide 20 Mg Tabs (Citalopram hydrobromide) .... Take 1 tablet by mouth once a day 2)  Lisinopril-hydrochlorothiazide 20-25 Mg Tabs (  Lisinopril-hydrochlorothiazide) .... Take 1  tablet by mouth once a day 3)  Bayer Low Strength 81 Mg Tbec (Aspirin) .... One tablet daily 4)  Onetouch Ultra Test Strp (Glucose blood) .... Two times a day 5)  Glucophage 500 Mg Tabs (Metformin hcl) .... One tab once daily 6)  Vitamin D 1000 Unit Tabs (Cholecalciferol) .... 2 by mouth once daily 7)  Docusate Sodium 100 Mg Caps (Docusate sodium) .... As needed 8)  Senna 187 Mg Tabs (Senna) .... 1/2 as needed 9)  Ocuvite Preservision Tabs (Multiple vitamins-minerals) .... Once daily 10)  Ambien 5 Mg Tabs (Zolpidem tartrate) .... Take one tab at bedtime 11)  Simvastatin 80 Mg Tabs (Simvastatin) .Marland Kitchen.. 1 tab @ bedtime  Patient Instructions: 1)  Please schedule a follow-up appointment in 3 months. 2)   Schedule your mammogram. 3)  Schedule a colonoscopy/sigmoidoscopy to help detect colon cancer. 4)  Take calcium +Vitamin D daily. 5)  Take an Aspirin every day. 6)  Check your blood sugars regularly. If your readings are usually above : or below 70 you should contact our office. 7)  It is important that your Diabetic A1c level is checked every 3 months. 8)  See your eye doctor yearly to check for diabetic eye damage. 9)  Check your feet each night for sore areas, calluses or signs of infection. 10)  Check your Blood Pressure regularly. If it is above: you should make an appointment. 11)  BMP prior to visit, ICD-9:.....250.00 12)  HbgA1C prior to visit, ICD-9: Prescriptions: AMBIEN 5 MG TABS (ZOLPIDEM TARTRATE) take one tab at bedtime  #100 x 3   Entered and Authorized by:   [name] MD   Signed by:   [name] MD on 03/14/2010   Method used:   Print then Give to Patient   RxID:   [id number] GLUCOPHAGE 500 MG TABS (METFORMIN HCL) one tab once daily  #100 x 3   Entered and Authorized by:   [name] MD   Signed by:   [name] MD on 03/14/2010   Method used:   Electronically to        [facility]* (mail-order)             ,          Ph: [phone]       Fax: [phone]   RxID:   [id number] ONETOUCH ULTRA TEST   STRP (GLUCOSE BLOOD) two times a day  #100 x 3   Entered and Authorized by:   [name] MD   Signed by:   [name] MD on 03/14/2010   Method used:   Electronically to        [facility]* (mail-order)             ,          Ph: [phone]       Fax: [phone]   RxID:   [id number] LISINOPRIL-HYDROCHLOROTHIAZIDE 20-25 MG TABS (LISINOPRIL-HYDROCHLOROTHIAZIDE) Take 1  tablet by mouth once a day  #100 x 3   Entered and Authorized by:   [name] MD   Signed by:   [name] MD on 03/14/2010   Method used:   Electronically to        [facility]* (mail-order)             ,          Ph: [phone]  Fax: [phone]   RxID:   [id number] SIMVASTATIN 80 MG TABS (SIMVASTATIN) 1 tab @ bedtime  #100 x 3   Entered and Authorized by:   [name] MD   Signed by:   [name] MD on 03/14/2010   Method used:   Electronically to        [facility]* (mail-order)             ,          Ph: [phone]       Fax: [phone]   RxID:   [id number] CITALOPRAM HYDROBROMIDE 20 MG TABS (CITALOPRAM HYDROBROMIDE) Take 1 tablet by mouth once a day  #100 x 3   Entered and Authorized by:   [name] MD   Signed by:   [name] MD on 03/14/2010   Method used:   Electronically to        [facility]* (mail-order)             ,          Ph: [phone]       Fax: [phone]   RxID:   [id number]

## 2010-12-06 NOTE — Progress Notes (Signed)
Summary: FYI------  Phone Note Call from Patient Call back at Home Phone [phone]   Caller: Patient-live call Summary of Call: pt has no diarrhea now. She is regular now. Initial call taken by: Warnell Forester,  December 02, 2009 1:08 PM  Follow-up for Phone Call        Provider Notified Follow-up by: [name] MD,  December 02, 2009 1:41 PM

## 2010-12-08 NOTE — Assessment & Plan Note (Signed)
Summary: ?inner ear inf/disscuss/cjr/pt rsc/cjr   Vital Signs:  Patient profile:   65 year old female Temp:     98.4 degrees F oral BP sitting:   110 / 80  (left arm) Cuff size:   regular  Vitals Entered By: Kern Reap CMA (AAMA) (November 10, 2010 12:00 PM) CC: vertigo   CC:  vertigo.  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  She states over the holiday.  She had the sudden onset of vertigo had to go home lie down.  Despite that had some nausea and vomiting.  Neurologic review of systems negative.  The vertigo finally abated after a couple hours.  This is her second episode.  The first episode only lasted for a few minutes and went away.  Allergies: 1)  ! * Generic Lisinopril 2)  * Generic Ambien  Past History:  Past medical, surgical, family and social histories (including risk factors) reviewed for relevance to current acute and chronic problems.  Past Medical History: Reviewed history from 01/11/2009 and no changes required. 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 Cholecystectomy Tonsillectomy CERVICAL SPINE FUSION-RUPTURED DISC Right knee arthroscopy Right knee replacement cervical laser x 2  Family History: Reviewed history from 01/11/2009 and no changes required. Family History of CAD Female 1st degree relative <50 Family History Diabetes 1st degree relative: Grandmother Family History High cholesterol Family History Hypertension No  FH of Colon Cancer: Maternal Aunt, Maternal cousin  Social History: Reviewed history from 10/23/2007 and no changes required. Occupation: Charity fundraiser Single Former Smoker Alcohol use-yes Drug use-no Regular exercise-yes  Review of Systems      See HPI  Physical Exam  General:  Well-developed,well-nourished,in no acute distress; alert,appropriate and cooperative throughout examination Head:  Normocephalic and atraumatic without obvious abnormalities. No apparent alopecia or balding. Eyes:  No corneal or conjunctival inflammation noted. EOMI. Perrla. Funduscopic exam benign, without hemorrhages, exudates or papilledema. Vision grossly normal. Ears:  External ear exam shows no significant lesions or deformities.  Otoscopic examination reveals clear canals, tympanic membranes are intact bilaterally without bulging, retraction, inflammation or discharge. Hearing is grossly normal bilaterally. Nose:  External nasal examination shows no deformity or inflammation. Nasal mucosa are pink and moist without lesions or exudates. Mouth:  Oral mucosa and oropharynx without lesions or exudates.  Teeth in good repair. Neurologic:  No cranial nerve deficits noted. Station and gait are normal. Plantar reflexes are down-going bilaterally. DTRs are symmetrical throughout. Sensory, motor and coordinative functions appear intact.   Problems:  Medical Problems Added: 1)  Dx of Vertigo  (ICD-780.4)  Impression & Recommendations:  Problem # 1:  VERTIGO (ICD-780.4) Assessment New  Complete Medication List: 1)  Citalopram Hydrobromide 20 Mg Tabs (Citalopram hydrobromide) .... Take 1 tablet by mouth once a day 2)  Lisinopril-hydrochlorothiazide 20-25 Mg Tabs (Lisinopril-hydrochlorothiazide) .... Take 1  tablet by mouth once a day 3)  Bayer Low Strength 81 Mg Tbec (Aspirin) .... One tablet daily 4)  Onetouch Ultra Test Strp (Glucose blood) .... Two times a day 5)  Glucophage 500 Mg Tabs (Metformin hcl) .... One tab  once daily  6)  Vitamin D 1000 Unit Tabs (Cholecalciferol) .... 2 by mouth once daily 7)  Docusate Sodium 100 Mg Caps (Docusate sodium) .... As needed 8)  Senna 187 Mg Tabs (Senna) .... 1/2 as needed 9)  Ocuvite Preservision Tabs (Multiple vitamins-minerals) .... Once daily 10)  Ambien 5 Mg Tabs (Zolpidem tartrate) .... Take one tab at bedtime 11)  Triamcinolone Acetonide 0.1 % Crea (Triamcinolone acetonide) .... Apply to area two times a day 12)  Lipitor 40 Mg Tabs (Atorvastatin calcium) .Marland Kitchen.. 1 tab @ bedtime 13)  Vicodin Es 7.5-750 Mg Tabs (Hydrocodone-acetaminophen) .... Take 1 tablet by mouth three times a day as needed pain 14)  Valium 2 Mg Tabs (Diazepam) .... One by mouth two times a day as needed  Patient Instructions: 1)  Please schedule a follow-up appointment in 3 months. 2)  BMP prior to visit, ICD-9: 3)  HbgA1C prior to visit, ICD-9:......250.00   Orders Added: 1)  Est. Patient Level III [81191]

## 2010-12-08 NOTE — Progress Notes (Signed)
Summary: Pts symptoms went away. Still coming in for ov  Phone Note Call from Patient   Caller: Patient Reason for Call: Talk to Nurse, Talk to Doctor Summary of Call: patient is calling because she is lightheaded and vominting. Initial call taken by: Kern Reap CMA Duncan Dull),  November 03, 2010 9:04 AM  Follow-up for Phone Call        Pt called and said that symptoms have gone away, but pt is still going to come in for her ov with Dr. Tawanna Cooler on 11/10/10.  Follow-up by: Lucy Antigua,  November 03, 2010 10:58 AM

## 2010-12-14 NOTE — Assessment & Plan Note (Signed)
Summary: check forehead for ?skin cancer in hair line/cjr   Vital Signs:  Patient profile:   65 year old female BP sitting:   120 / 74  (left arm) Cuff size:   regular  Vitals Entered By: Kern Reap CMA Duncan Dull) (December 02, 2010 11:53 AM)  Procedure Note Last Tetanus: Tdap (06/21/2010)  Mole Biopsy/Removal: Indication: inflamed lesion Consent signed: yes  Procedure # 1: elliptical incision with 2 mm margin    Size (in cm): 1.0 x 1.0    Region: lateral    Location: frontal-right    Instrument used: #15 blade    Anesthesia: 1% lidocaine w/epinephrine    Closure: cautery  Cleaned and prepped with: alcohol Wound dressing: neosporin and pressure dressing  CC: lession on side of head   CC:  lession on side of head.  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 * Generic Lisinopril 2)  * Generic Ambien   Complete Medication List: 1)  Citalopram Hydrobromide 20 Mg Tabs (Citalopram hydrobromide) .... Take 1 tablet by mouth once a day 2)  Lisinopril-hydrochlorothiazide 20-25 Mg Tabs (Lisinopril-hydrochlorothiazide) .... Take 1  tablet by mouth once a day 3)  Bayer Low Strength 81 Mg Tbec (Aspirin) .... One tablet daily 4)  Onetouch Ultra Test Strp (Glucose blood) .... Two times a day 5)  Glucophage 500 Mg Tabs (Metformin hcl) .... One tab once daily 6)  Vitamin D 1000 Unit Tabs (Cholecalciferol) .... 2 by mouth once daily 7)  Docusate Sodium 100 Mg Caps (Docusate sodium) .... As needed 8)  Senna 187 Mg Tabs (Senna) .... 1/2 as needed 9)  Ocuvite Preservision Tabs (Multiple vitamins-minerals) .... Once daily 10)  Ambien 5 Mg Tabs (Zolpidem tartrate) .... Take one tab at bedtime 11)  Triamcinolone Acetonide 0.1 % Crea (Triamcinolone acetonide) .... Apply to area two times a day 12)  Lipitor 40 Mg Tabs (Atorvastatin  calcium) .Marland Kitchen.. 1 tab @ bedtime 13)  Vicodin Es 7.5-750 Mg Tabs (Hydrocodone-acetaminophen) .... Take 1 tablet by mouth three times a day as needed pain 14)  Valium 2 Mg Tabs (Diazepam) .... One by mouth two times a day as needed  Other Orders: Shave Skin Lesion 0.6-1.0cm face/ears/eyelids/nose/lips/mm (11311)   Orders Added: 1)  Shave Skin Lesion 0.6-1.0cm face/ears/eyelids/nose/lips/mm [16109]

## 2010-12-14 NOTE — Miscellaneous (Signed)
Summary: eye exam  Clinical Lists Changes  Observations: Added new observation of EYES COMMENT: 12/2011 (12/05/2010 17:28) Added new observation of EYE EXAM BY: dunn (11/22/2010 17:28) Added new observation of DMEYEEXMRES: normal (11/22/2010 17:28) Added new observation of DIAB EYE EX: normal (11/22/2010 17:28)      Diabetes Management History:      She says that she is exercising.    Diabetes Management Exam:    Eye Exam:       Eye Exam done elsewhere          Date: 11/22/2010          Results: normal          Done by: Lionel December Exam  macular degen, OU Kern Reap CMA Duncan Dull)  December 05, 2010 5:29 PM

## 2011-02-20 ENCOUNTER — Ambulatory Visit (INDEPENDENT_AMBULATORY_CARE_PROVIDER_SITE_OTHER): Payer: Medicare Other | Admitting: Family Medicine

## 2011-02-20 ENCOUNTER — Encounter: Payer: Self-pay | Admitting: Family Medicine

## 2011-02-20 DIAGNOSIS — J45909 Unspecified asthma, uncomplicated: Secondary | ICD-10-CM

## 2011-02-20 DIAGNOSIS — E119 Type 2 diabetes mellitus without complications: Secondary | ICD-10-CM

## 2011-02-20 LAB — HEPATIC FUNCTION PANEL
Alkaline Phosphatase: 102 U/L (ref 39–117)
Bilirubin, Direct: 0.1 mg/dL (ref 0.0–0.3)
Total Bilirubin: 0.6 mg/dL (ref 0.3–1.2)

## 2011-02-20 LAB — BASIC METABOLIC PANEL
CO2: 31 mEq/L (ref 19–32)
Calcium: 10.4 mg/dL (ref 8.4–10.5)
Creatinine, Ser: 1 mg/dL (ref 0.4–1.2)
GFR: 58.52 mL/min — ABNORMAL LOW (ref >60.00)
Sodium: 140 mEq/L (ref 135–145)

## 2011-02-20 LAB — LIPID PANEL
HDL: 66.7 mg/dL (ref >39.00)
LDL Cholesterol: 93 mg/dL (ref 0–99)
Total CHOL/HDL Ratio: 3
VLDL: 24.4 mg/dL (ref 0.0–40.0)

## 2011-02-20 LAB — CBC WITH DIFFERENTIAL/PLATELET
Basophils Absolute: 0 10*3/uL (ref 0.0–0.1)
Basophils Relative: 0.4 % (ref 0.0–3.0)
Eosinophils Absolute: 0.2 10*3/uL (ref 0.0–0.7)
Lymphocytes Relative: 25.7 % (ref 12.0–46.0)
MCHC: 33.8 g/dL (ref 30.0–36.0)
Neutrophils Relative %: 63.6 % (ref 43.0–77.0)
RBC: 4.83 Mil/uL (ref 3.87–5.11)
RDW: 13.8 % (ref 11.5–14.6)

## 2011-02-20 LAB — POCT URINALYSIS DIPSTICK
Leukocytes, UA: NEGATIVE
Nitrite, UA: NEGATIVE
Protein, UA: NEGATIVE
pH, UA: 5

## 2011-02-20 LAB — HEMOGLOBIN A1C: Hgb A1c MFr Bld: 6.7 % — ABNORMAL HIGH (ref 4.6–6.5)

## 2011-02-20 MED ORDER — PREDNISONE 20 MG PO TABS: ORAL_TABLET | ORAL | Status: DC

## 2011-02-20 NOTE — Patient Instructions (Signed)
Begin prednisone as directed.  I will call you the report of your A1c

## 2011-02-20 NOTE — Progress Notes (Signed)
  Subjective:    Patient ID: Lynn [name], female    DOB: 1946/05/08, 65 y.o.   MRN: [id number]  HPI Lynn [name] is a 65 year old single female, who comes in today for evaluation of a cough.  She states about 6 days ago.  Her allergies flared up.  She's been taking plain Zyrtec nightly at bedtime for about 6 weeks.  Now the cough is getting worse.  She feels like she might be wheezing.  She's had a history of asthma in the past.  Her last blood sugar was 121, fasting she's due for an A1c   Review of Systems General metabolic and pulmonary review of systems otherwise negative    Objective:   Physical Exam    Well-developed well-nourished, female, in no acute distress.  HEENT negative.  Neck supple.  No adenopathy.  Lungs are clear except for late expiratory wheezing bilaterally    Assessment & Plan:  ,Allergic rhinitis with asthma,,,,,,,,,,,,, prednisone burst and taper.  Diabetes type II,,,,,,,,,, check A1c

## 2011-03-04 ENCOUNTER — Emergency Department (HOSPITAL_COMMUNITY)
Admission: EM | Admit: 2011-03-04 | Discharge: 2011-03-05 | Disposition: A | Discharge status: Home / Self Care | Payer: Medicare Other | Attending: Emergency Medicine | Admitting: Emergency Medicine

## 2011-03-04 DIAGNOSIS — E119 Type 2 diabetes mellitus without complications: Secondary | ICD-10-CM | POA: Insufficient documentation

## 2011-03-04 DIAGNOSIS — R42 Dizziness and giddiness: Secondary | ICD-10-CM | POA: Insufficient documentation

## 2011-03-04 DIAGNOSIS — I10 Essential (primary) hypertension: Secondary | ICD-10-CM | POA: Insufficient documentation

## 2011-03-04 DIAGNOSIS — E7800 Pure hypercholesterolemia, unspecified: Secondary | ICD-10-CM | POA: Insufficient documentation

## 2011-03-04 DIAGNOSIS — R112 Nausea with vomiting, unspecified: Secondary | ICD-10-CM | POA: Insufficient documentation

## 2011-03-05 LAB — COMPREHENSIVE METABOLIC PANEL
ALT: 20 U/L (ref 0–35)
Calcium: 9.9 mg/dL (ref 8.4–10.5)
Glucose, Bld: 172 mg/dL — ABNORMAL HIGH (ref 70–99)
Sodium: 138 mEq/L (ref 135–145)
Total Protein: 6.5 g/dL (ref 6.0–8.3)

## 2011-03-05 LAB — CBC
MCH: 29.7 pg (ref 26.0–34.0)
Platelets: 195 10*3/uL (ref 150–400)
RBC: 5.01 MIL/uL (ref 3.87–5.11)
WBC: 11.4 10*3/uL — ABNORMAL HIGH (ref 4.0–10.5)

## 2011-03-05 LAB — DIFFERENTIAL
Basophils Absolute: 0 10*3/uL (ref 0.0–0.1)
Basophils Relative: 0 % (ref 0–1)
Eosinophils Absolute: 0.1 10*3/uL (ref 0.0–0.7)
Neutrophils Relative %: 65 % (ref 43–77)

## 2011-03-05 LAB — URINALYSIS, ROUTINE W REFLEX MICROSCOPIC
Nitrite: NEGATIVE
Specific Gravity, Urine: 1.021 (ref 1.005–1.030)
Urobilinogen, UA: 0.2 mg/dL (ref 0.0–1.0)

## 2011-03-05 LAB — GLUCOSE, CAPILLARY: Glucose-Capillary: 172 mg/dL — ABNORMAL HIGH (ref 70–99)

## 2011-03-09 ENCOUNTER — Other Ambulatory Visit: Payer: Self-pay

## 2011-03-09 ENCOUNTER — Other Ambulatory Visit: Payer: Self-pay | Admitting: Family Medicine

## 2011-03-09 DIAGNOSIS — Z0000 Encounter for general adult medical examination without abnormal findings: Secondary | ICD-10-CM

## 2011-03-16 ENCOUNTER — Ambulatory Visit: Payer: Self-pay | Admitting: Family Medicine

## 2011-03-16 ENCOUNTER — Encounter: Payer: Self-pay | Admitting: Family Medicine

## 2011-03-16 ENCOUNTER — Ambulatory Visit (INDEPENDENT_AMBULATORY_CARE_PROVIDER_SITE_OTHER): Payer: Medicare Other | Admitting: Family Medicine

## 2011-03-16 VITALS — BP 110/78

## 2011-03-16 DIAGNOSIS — L259 Unspecified contact dermatitis, unspecified cause: Secondary | ICD-10-CM

## 2011-03-16 DIAGNOSIS — L309 Dermatitis, unspecified: Secondary | ICD-10-CM

## 2011-03-16 DIAGNOSIS — E119 Type 2 diabetes mellitus without complications: Secondary | ICD-10-CM

## 2011-03-16 DIAGNOSIS — R42 Dizziness and giddiness: Secondary | ICD-10-CM

## 2011-03-16 DIAGNOSIS — E785 Hyperlipidemia, unspecified: Secondary | ICD-10-CM

## 2011-03-16 DIAGNOSIS — I10 Essential (primary) hypertension: Secondary | ICD-10-CM

## 2011-03-16 DIAGNOSIS — F3289 Other specified depressive episodes: Secondary | ICD-10-CM

## 2011-03-16 DIAGNOSIS — F329 Major depressive disorder, single episode, unspecified: Secondary | ICD-10-CM

## 2011-03-16 DIAGNOSIS — M25569 Pain in unspecified knee: Secondary | ICD-10-CM

## 2011-03-16 MED ORDER — HYDROCODONE-ACETAMINOPHEN 7.5-750 MG PO TABS: 1.0000 | ORAL_TABLET | Freq: Four times a day (QID) | ORAL | Status: DC | PRN

## 2011-03-16 MED ORDER — ZOLPIDEM TARTRATE 5 MG PO TABS: 5.0000 mg | ORAL_TABLET | Freq: Every evening | ORAL | Status: DC | PRN

## 2011-03-16 MED ORDER — METFORMIN HCL 500 MG PO TABS: 500.0000 mg | ORAL_TABLET | Freq: Every day | ORAL | Status: DC

## 2011-03-16 MED ORDER — CITALOPRAM HYDROBROMIDE 20 MG PO TABS: 20.0000 mg | ORAL_TABLET | Freq: Every day | ORAL | Status: DC

## 2011-03-16 MED ORDER — LISINOPRIL-HYDROCHLOROTHIAZIDE 20-25 MG PO TABS: 1.0000 | ORAL_TABLET | Freq: Every day | ORAL | Status: DC

## 2011-03-16 MED ORDER — TRIAMCINOLONE ACETONIDE 0.1 % EX CREA: TOPICAL_CREAM | Freq: Two times a day (BID) | CUTANEOUS | Status: DC

## 2011-03-16 MED ORDER — ATORVASTATIN CALCIUM 40 MG PO TABS: 40.0000 mg | ORAL_TABLET | Freq: Every day | ORAL | Status: DC

## 2011-03-16 NOTE — Progress Notes (Signed)
Addended by: Kern Reap on: 03/16/2011 04:01 PM   Modules accepted: Orders

## 2011-03-16 NOTE — Patient Instructions (Signed)
Continue your current medications.  Re- consult with Dr. Ezzard Standing about the vertigo.  Follow-up in 6 months with BMP and A1c1 week prior.  Remember to walk everyday  You might want to keep her head elevated and do not lie flat at night.  That may, be what's triggering the vertigo

## 2011-03-16 NOTE — Progress Notes (Signed)
  Subjective:    Patient ID: Lynn [name], female    DOB: [birth year], 65 y.o.   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  She has history of hyperlipidemia, for which he takes Lipitor 40 mg nightly  She has a history of mild depression, for which he takes Celexa 20 nightly  She is a history of hypertension, for which he takes Zestoretic 20 to 25 daily.  BP 120/76.  She has a history of diabetes, for which he takes metformin 500 mg daily.  She is a history of sleep dysfunction for which she takes Ambien p.r.n.  She has a history of a right total knee replacement.  However, she still has pain in that me and takes an occasional Vicodin.  She had another episode about 10 days ago of the profound vertigo with nausea and vomiting.  After IV fluids and Zofran in the emergency room the symptoms abated.  This is her second episode.  She's been to see Dr. Ezzard Standing about this in the past.  They did an MRI which showed no evidence of tumor, et Karie Soda.  Routine eye care,, dental care, BSE monthly, annual mammography, colonoscopy, normal, tetanus, 2004, shingles 2009, Pneumovax, x 2,      Review of Systems  Constitutional: Negative.   HENT: Negative.   Eyes: Negative.   Respiratory: Negative.   Cardiovascular: Negative.   Gastrointestinal: Negative.   Genitourinary: Negative.   Musculoskeletal: Negative.   Neurological: Positive for dizziness.  Hematological: Negative.   Psychiatric/Behavioral: Negative.        Objective:   Physical Exam  Constitutional: She appears well-developed and well-nourished.  HENT:  Head: Normocephalic and atraumatic.  Right Ear: External ear normal.  Left Ear: External ear normal.  Nose: Nose normal.  Mouth/Throat: Oropharynx is clear and moist.  Eyes: EOM are normal. Pupils are equal, round, and reactive to light.  Neck: Normal range of motion. Neck supple. No  thyromegaly present.  Cardiovascular: Normal rate, regular rhythm, normal heart sounds and intact distal pulses.  Exam reveals no gallop and no friction rub.   No murmur heard. Pulmonary/Chest: Effort normal and breath sounds normal.  Abdominal: Soft. Bowel sounds are normal. She exhibits no distension and no mass. There is no tenderness. There is no rebound.  Genitourinary:        Bilateral breast exam normal  Musculoskeletal: Normal range of motion.  Lymphadenopathy:    She has no cervical adenopathy.  Neurological: She is alert. She has normal reflexes. No cranial nerve deficit. She exhibits normal muscle tone. Coordination normal.  Skin: Skin is warm and dry.  Psychiatric: She has a normal mood and affect. Her behavior is normal. Judgment and thought content normal.          Assessment & Plan:  Hyperlipidemia continue Lipitor 40 mg daily, and the 81 mg, baby aspirin.  Depression continue Celexa 20 nightly  Degenerative joint disease, status post right total knee replacement.  Vicodin p.r.n.  Hypertension.  Continue Prinzide one daily.  Diabetes continue metformin 500 daily.  Sleep dysfunction.  Continue Ambien 5 nightly p.r.n.  Vertigo.  Follow-up with Dr. Ezzard Standing

## 2011-03-20 ENCOUNTER — Other Ambulatory Visit: Payer: Self-pay | Admitting: *Deleted

## 2011-03-20 ENCOUNTER — Other Ambulatory Visit: Payer: Self-pay | Admitting: Family Medicine

## 2011-03-20 DIAGNOSIS — M25569 Pain in unspecified knee: Secondary | ICD-10-CM

## 2011-03-20 DIAGNOSIS — F329 Major depressive disorder, single episode, unspecified: Secondary | ICD-10-CM

## 2011-03-20 DIAGNOSIS — F3289 Other specified depressive episodes: Secondary | ICD-10-CM

## 2011-03-20 MED ORDER — HYDROCODONE-ACETAMINOPHEN 7.5-750 MG PO TABS: 1.0000 | ORAL_TABLET | Freq: Four times a day (QID) | ORAL | Status: DC | PRN

## 2011-03-20 MED ORDER — ZOLPIDEM TARTRATE 5 MG PO TABS: 5.0000 mg | ORAL_TABLET | Freq: Every evening | ORAL | Status: DC | PRN
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
## 2011-03-21 NOTE — Discharge Summary (Signed)
Lynn [name], Lynn [name]            ACCOUNT NO.:  [id number]   MEDICAL RECORD NO.:  [id number]          PATIENT TYPE:  INP   LOCATION:  1609                         FACILITY:  [facility]   PHYSICIAN:  John L. Rendall, M.D.  DATE OF BIRTH:  1946/03/12   DATE OF ADMISSION:  07/22/2007  DATE OF DISCHARGE:  07/25/2007                               DISCHARGE SUMMARY   ADMISSION DIAGNOSIS:  Infected right total knee arthroplasty.   DISCHARGE DIAGNOSES:  1. Infected right total knee arthroplasty.  2. Post-hemorrhagic anemia.  3. Hypertension.  4. Hypercholesterolemia.  5. Obesity.  6. Diabetes mellitus.   PROCEDURE:  Stage 2 removal of spacer and reimplantation of a TC3 Sigma  total knee arthroplasty.   HISTORY:  Lynn [name] is a 65 year old white female who had sustained a  total knee arthroplasty, earlier.  She developed a infection in the  total knee.  She underwent a removal of all of the implants, and a  antibiotic spacer was placed.  She had marked improvement in her  infectious state.  She was then cleared by infectious disease for  revision total knee arthroplasty, was admitted at this time for revision  total knee arthroplasty.   HOSPITAL COURSE:  A 65 year old white female admitted July 22, 2007, after appropriate laboratory studies were obtained.  She was taken  to the operating room, where she underwent a revision of her right total  knee arthroplasty.  She tolerated the procedure well.  She was continued  on Ancef 1 gram IV q.6 h. for six doses.  The patient as then placed on  Ancef 1 gram IV q.6 h., until the patient was discharged.  Begun on  Arixtra 2.5 mg subcu daily, beginning 11:00 p.m. on the day of her  surgery, and then 9:00 p.m. daily.  Celebrex 400 mg at PACU and 200 mg  b.i.d. was begun also.  Hemovac was planned for 4 hours.  A Dilaudid PCA  pump at a reduced dose was started.  Consults for PT, OT and care  management were made.  She may be weightbearing as  tolerated.  Sliding  scale was ordered.  Sliding scale was discontinued on the 16th.  Accu-  Checks were continued at a.c. and h.s.  Begun on Pepcid AC 30 mg q.a.m.,  OxyContin 10 mg p.o. q.12 h.  Arixtra instructions were made.  OxyContin  could be in increased to 20 mg, if 10 mg is not effective during the  day.  She was taken then to a full dose PCA on the 16th.  On the 17th, a  chest x-ray was ordered for follow-up choking.  Apparently, she had  either an anxiety attack or possibly some aspiration postoperatively.  She was given 2 units of packed cells on the 17th.  Lasix 10 mg IV after  each unit.  On the 18th, she had a stat Doppler of the right leg, which  was negative.  She was then discharged on the 18th, with home health  care with [facility].  She was discharged in improved condition.   LABORATORY DATA:  Admitted with hemoglobin 13.2, hematocrit  39.8%, white  count 7,800, platelets 241,000.  Discharge hemoglobin 10.7, hematocrit  32.3, WBC 6.8, platelet count was 169,000.  Pro-time pre-op 13.1, INR  1.0, and PTT was 28.  Pre-op chemistries:  Sodium 137, potassium 4.1,  chloride 100, CO2 29, glucose 125, BUN 9, creatinine 0.80, GFR 60, total  protein 6.6, albumin 3.6, AST 18, ALT 50, ALP 125, and total bilirubin  0.7.  Discharge sodium 141, potassium 4.0, chloride 107, CO2 28, glucose  139, BUN 5, creatinine 0.66, GFR greater than 60.  Glycosylated  hemoglobin was 8.0.  Urinalysis benign for voided urine, blood type O+,  antibody screen negative.  Given 2 units of packed cells.  Urine culture  pre-op shows 50,000 colonies per mL, multiple bacteria morphotypes.  Bone sent during surgery for culture revealed no growth after 2 days and  no anaerobes isolated.   RADIOGRAPHIC STUDIES:  September 17.  Chest x-ray revealed no evidence  of focal infiltrate.  Coarse bronchitic changes stable from prior.   DISCHARGE INSTRUCTIONS:  No restrictions in her diet.  She though needs  to follow  a diabetic diet.  Increase activity slowly.  May shower or  bathe.  No lifting or driving for 6 weeks.  See the blue total knee  sheet and follow this.  Prescriptions for Arixtra 2.5 mg, inject 2.5  subcutaneously at 8:00 p.m., last dose September 21.  On September 22,  resume daily baby aspirin.  OxyContin 10 mg, one tablet every 12 hours.  Percocet 5/325, one or two tabs every 4 hours as needed for pain.  Celebrex 200 mg, take 1 tablet b.i.d. __________  one tablet daily.  Robaxin 500 mg, one to two tabs every 6 hours as needed for spasms.  Use  CPM was ordered.  Follow up with Dr. Priscille Kluver on Tuesday, September 30,  for follow-up.   Discharged her in improved condition.      Oris Drone Petrarca, P.A.-C.      Carlisle Beers. Rendall, M.D.  Electronically Signed    BDP/MEDQ  D:  08/12/2007  T:  08/12/2007  Job:  [id number]

## 2011-03-21 NOTE — Assessment & Plan Note (Signed)
OFFICE VISIT   Lynn [name], Lynn [name]  DOB:  04-Jul-1946                                        October 30, 2007  CHART #:  [id number]   Blood pressure was 134/80.  Pulse 100.  Respirations 18.  Sats were 97%.   Patient finally got her knee and is ambulating well.  She has had a  tough summer, had her prosthesis removed, and then developed an infected  prosthesis and required six weeks of strep and had a septic reaction to  a contaminated Rocephin bag.  Now is doing well.   Her CT scan showed no change in her left lower lobe nodule.  It is still  about 9 mm.  I do not have the final report but will check it and let  her know if there is any change.  Otherwise, I will see her back again  in six months with another CT scan.   Ines Bloomer, M.D.  Electronically Signed   DPB/MEDQ  D:  10/30/2007  T:  10/30/2007  Job:  [id number]

## 2011-03-21 NOTE — Op Note (Signed)
NAME:  RETAL, Lynn [name] NO.:  [id number]   MEDICAL RECORD NO.:  [id number]          PATIENT TYPE:  INP   LOCATION:  5001                         FACILITY:  [facility]   PHYSICIAN:  John [name]. Rendall, M.D.  DATE OF BIRTH:  1945/11/13   DATE OF PROCEDURE:  05/03/2007  DATE OF DISCHARGE:                               OPERATIVE REPORT   PREOPERATIVE DIAGNOSIS:  Loose right total knee.   POSTOPERATIVE DIAGNOSIS:  Infected loose right total knee.   PROCEDURE:  Removal of total knee in preparation for full revision, but  ultimately with insertion of methacrylate vancomycin spacers following  determination of infection in bone cyst.   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   SURGEON:  John [name]. Rendall, M.D.   ASSISTANT:  Legrand Pitts. Duffy, P.A.   ANESTHESIA:  General.   PATHOLOGY:  The patient has had several aspirations of this knee that  have been negative.  Her Gram stain and so forth were negative but x-ray  and bone scan were consistent with loosening of the total knee, both on  femur and tibia.  At the time of surgery joint fluid in the knee was  clear, but there was found to be a cyst in the proximal tibia laterally  that was full of well contained cyst capsule and pusy glue-looking fluid  that was positive for gram positive cocci in pairs and clusters   PROCEDURE:  Under general anesthesia, the right leg was prepared with  DuraPrep, draped as a sterile field, wrapped out with an Esmarch and a  sterile tourniquet is used at 350 mm.  Previous surgical scar is  excised.  The midline incision is reopened.  The deep incision is  reopened going through the line of old Tycron sutures.  The knee is  found to have a clearly fibrous capsule full of clear fluid and  relatively little foreign body debris.  The knee is flexed and  synovectomy is carried out.  The femoral component is removed after  removal of the  tibial spacer.  The tibial spacer post is osteotomized  and the spacer is then pulled out and removed.  Femoral component is  felt to be undermined laterally and lesser medially.  It is then  undermined further  with osteotome and skinny saw blades and removed.  The tibial tray is then better exposed and found to be grossly loose.  It is elevated and removed.  Upon removal of a thin layer of  methacrylate, a cyst was found in the anterolateral tibia and it had  heavy cyst wall and different appearance then the synovial fluid in the  rest of the knee.  Separate Gram stain C&S was sent on this and this  ultimately turned out to be positive for gram positive cocci in pairs  and clusters.  In the meantime, however, preparation proceeded for total  knee revision and the intramedullary stem preparation was made in the  tibia going to a size 14 and then the femur going to size a 18.  Clean-  up cuts on  the proximal tibia and distal femur were made.  The tibia was  sized to a #3, the femur was sized to a #3 and we were in preparation to  do final cuts on the femur.  The second Gram stain came back as full of  gram positive cocci.  At this point, vancomycin methacrylate spacer  blocks were made, two packs for the femur, one pack for the tibia.  Before insertion of these, the bone was further washed out with 3 liters  of saline through the pressure sprayer and further debridement of #1  Tycron was done.  The spacer blocks were then shaped in the manner of a  prosthetic femur using two packs of bone cement on the femur and one  pack for a tibial tray.  Once the cement hardened, the tourniquet was  let down at 1 hour and 35 minutes.  The wound was then closed after  routine cauterization with 2-0 Vicryl and skin clips.   OPERATIVE TIME:  Just under 2 hours.   The patient tolerated the procedure well and returned to recovery in  satisfactory condition.      John [name]. Rendall, M.D.  Electronically  Signed     JLR/MEDQ  D:  05/03/2007  T:  05/03/2007  Job:  [id number]

## 2011-03-21 NOTE — Letter (Signed)
Apr 03, 2007   Dr. Erasmo Leventhal   Re:  Lynn [name], SEBRING                  DOB:  05-22-1946   Dear Jonny Ruiz:   I saw Lynn [name] in the office today.The two left lower lobe lesions  are stable so these again point more toward a benign process.  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  From the standpoint of the chest nodules, I think they are  stable.  I still want to follow them and suggest getting another CT scan  without contrast in six months.  Her blood pressure was 147/83, pulse  83, respirations 18, and SATs were 98%.  I will see her back again in  six months with a CT scan.   Lynn [name], M.D.  Electronically Signed   DPB/MEDQ  D:  04/03/2007  T:  04/03/2007  Job:  [id number]   cc:   Tinnie Gens A. Tawanna Cooler, MD

## 2011-03-21 NOTE — Op Note (Signed)
NAME:  Lynn [name], Lynn [name] NO.:  [id number]   MEDICAL RECORD NO.:  [id number]          PATIENT TYPE:  INP   LOCATION:  0011                         FACILITY:  [facility]   PHYSICIAN:  John L. Rendall, M.D.  DATE OF BIRTH:  Mar 07, 1946   DATE OF PROCEDURE:  07/22/2007  DATE OF DISCHARGE:                               OPERATIVE REPORT   PREOPERATIVE DIAGNOSIS:  Infected right total knee.   SURGICAL PROCEDURES:  Stage 2 removal of spacer and reimplantation of  TC3 segment total knee replacement with fluted femoral and tibial stems,  both cemented in.   POSTOPERATIVE DIAGNOSIS:  Stage 2 removal of spacer and reimplantation  of TC3 segment total knee replacement with fluted femoral and tibial  stems, both cemented in.   SURGEON:  John L. Rendall, M.D.   ASSISTANTArlys John D. Petrarca, P.A.-C.   ANESTHESIA:  General plus femoral nerve block.   FINDINGS:  The methacrylate spacers had scarred in place nicely and took  some dissection to free them up.  No evidence of active infection.   PROCEDURE:  Under general anesthesia, the right knee was prepared with  DuraPrep and draped as a sterile field with sterile proximal thigh  tourniquet.  Legs wrapped out with the Esmarch and the tourniquet was  used at 350 mm.  Midline incision was excised, removing the skin scar.  Dissection was then carried down through the medial parapatellar fold  incision, elevating flaps of skin medially and laterally for about an  inch.  Care was taken then to expose the proximal tibia through  dissection anteromedially and beneath the patellar tendon.  A protective  pin was placed into the takeoff of the patellar tendon from the tibial  tubercle to help prevent avulsion when the knee flexed.  Blunt  dissection around the flare of the distal femur was then done to free  adhesions there to mobilize the quadriceps.  The scar tissue in the  gutters required excision and freeing with Cobb elevator.  Once  this was  sufficiently done, the methacrylate blocks were broken with osteotome  and mallet and removed from the femur and then the tibia.  Once these  were out of the way and the posterior aspect of the femur was freed from  adhesions by dissection with a Cobb on the back of the femoral condyle,  keeping the tip on the bone, then the femur was re-reamed with hand  reamers up to size 16.  The femur was re-cut for the TC3 segments  femoral component.  Once this was completed for a #3 femur and once this  was done, augments were required, 8 mm distally, 8 mm and 4 mm  posteriorly with 8 mm lateral and 4 medially.  Once this had been put  together, attention was turned to the tibia.  It was exposed with a  McGill posteriorly, a Holman laterally.  It was not possible to evert  the patella, but it was mobilized out of the way, avoiding a quadriceps  snip.  The patella was nothing but cortical shell, and it was felt not  appropriate to  re-implant a button at this point.  The tibia was  exposed, and central reaming was done up to a 14.  Because bone loss and  the abscess in the tibia, it was necessary to use a sleeve.  MBT sleeve  was then done 29 mm and the size 3 revision tray, the 75 x 14 fluted  stem, and after several trials, it was determined that the 12.5  polyethylene spacer was the right size.  Permanent components were then  obtained.  They were cemented in one at that time with care taken to  pack the bone in the old abscess cavity of the anterior metaphysis of  the tibia, and then the deficient lateral femoral condyle area before  inserting the stems and the permanent components.  Once the cement  hardened on the tibia, cement was prepared, and the femur was done.  Both components required 2 packs of cement, and because her original  germ had been a strep, 2 grams of Kefzol used in each 2 packs of cement.  The patient's tourniquet was then let down once the cement hardened at 2   hours to 5 minutes.  A geniculate posterior laterally was cauterized  that bled, and there was no other significant bleeding.  Care was taken  to remove all excess fluid.  The knee was then closed in layers with #1  Tycron, #1 Vicryl, 2-0 Vicryl and skin clips.  Total time approximately  2 hours 20 minutes.  The patient tolerated the procedure well and  returned to recovery in good condition.      John L. Rendall, M.D.  Electronically Signed     JLR/MEDQ  D:  07/22/2007  T:  07/22/2007  Job:  [id number]

## 2011-03-21 NOTE — Assessment & Plan Note (Signed)
OFFICE VISIT   Lynn [name], Lynn [name]  DOB:  1946-04-20                                        April 30, 2008  CHART #:  [id number]   The patient came today for followup of her CT scan and a lesion in the  anterior fissure of the left lobe.  It is still 10.5 mm, which was  unchanged and the left upper lobe nodule was 4 mm, which was unchanged.  We will see her back again with another CT scan.  Her blood pressure was  110/68, pulse 81, respirations 18, and sats were 98%.  She is doing well  overall since she has had Dr. Priscille Kluver worked on her hip.   Ines Bloomer, M.D.  Electronically Signed   DPB/MEDQ  D:  04/30/2008  T:  04/30/2008  Job:  [id number]   cc:   Jonny Ruiz L. Rendall, M.D.  Jeffrey A. Tawanna Cooler, MD

## 2011-03-21 NOTE — Letter (Signed)
June 30, 2009   Tinnie Gens A. Tawanna Cooler, MD  [address] [place], [place] [address]   Re:  Lynn [name], KINN                  DOB:  12/03/1945   Dear Tinnie Gens,   I saw the patient back today for follow up of her left upper lobe nodule  that is 9 x 11 mm and has not changed.  I am going to follow it up one  more time in 9 months with another CT scan.  This is probably as I  mentioned a benign nodule, but it is of fairly significant size.  If it  remains stable then I hope, we can refer her back to you for long-term  follow up.  Her blood pressure was 122/72, pulse 81, respirations 18,  sats were 98%.   Sincerely,   Ines Bloomer, M.D.  Electronically Signed   DPB/MEDQ  D:  06/30/2009  T:  06/30/2009  Job:  [id number]

## 2011-03-21 NOTE — Assessment & Plan Note (Signed)
OFFICE VISIT   BEADIE, MATSUNAGA  DOB:  06/25/1946                                        October 21, 2008  CHART #:  [id number]   The patient comes today and she is doing well.  She saw Dr. Priscille Kluver who  will see her again in a year.  Her CT scan today shows that the left  upper lobe nodule is stable and has been stable for approximately 18  months.  We will see her again in 9 months now with a CT scan and if it  is stable, then hopefully, we will release her back to her medical  doctor.  Her blood pressure is 131/81, pulse 85, respirations 18, and  sats are 98%.   Ines Bloomer, M.D.  Electronically Signed   DPB/MEDQ  D:  10/21/2008  T:  10/21/2008  Job:  [id number]

## 2011-03-21 NOTE — Discharge Summary (Signed)
NAME:  Lynn [name], Lynn [name] NO.:  [id number]   MEDICAL RECORD NO.:  [id number]          PATIENT TYPE:  INP   LOCATION:  5020                         FACILITY:  [facility]   PHYSICIAN:  Valerie A. Felicity Coyer, MDDATE OF BIRTH:  11/23/1945   DATE OF ADMISSION:  06/02/2007  DATE OF DISCHARGE:  06/06/2007                               DISCHARGE SUMMARY   DISCHARGE DIAGNOSES:  1. Fever of unclear etiology.  2. Right knee infection status post removal of hardware 06/27 with      antibiotic spacer  3. Dyslipidemia.  4. Diet-controlled diabetes type 2.   HISTORY OF PRESENT ILLNESS:  Ms. Siglin is a 65 year old female who was  admitted on 06/02/2007 with a 3-day history of fever.  She had a recent  removal of right total knee repair on 05/03/2007 per Dr. Brynda Greathouse  secondary to infection.  She is noted to have gram-positive cocci on  aspirate, blood culture is negative.  Antibiotic spacer was placed.  She  has been on home IV Rocephin via a right upper extremity PICC line for  presumed strep infection.  She was doing well after discharge 05/08/2007  until 05/29/2007 when she developed shaking chills and fever as well as  diarrhea.  She called the [facility] and was placed on empiric Flagyl for  questionable C diff colitis.  She visited the [facility] on 05/31/2007  for lab work.  She spiked a fever of 103 on the evening prior to  admission and called infectious disease.  She was referred for inpatient  evaluation to rule out sepsis.   PAST MEDICAL HISTORY:  1. Removal of right total knee replacement 06/27  2. Hypertension.  3. Dyslipidemia  4. Diet-controlled diabetes type 2  5. COPD  6. Macular degeneration.  7. Depression.  8. 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   COURSE OF HOSPITALIZATION:  1. Fever, rule out sepsis.  The patient was admitted and was seen in      consultation by infectious disease.  She was placed on IV      vancomycin after blood cultures were drawn.   She was noted to have      some mild hypotension which was asymptomatic on admission.  Culture      data at time of discharge is as follows:  Urine culture 06/02/2007      negative, blood culture 06/02/2007 no growth to date x2.  Chest X-      Ray performed on admission showed no active lung disease.  It did      note again a left lung nodule.  The patient was seen initially      during this admission by Dr. Cliffton Asters and followed up by Dr.      Paulette Blanch Dam.  He felt that the patient's diarrhea was most      likely due to an acute viral gastroenteritis.  He also felt that it      was possible she could have had a low pathogenicity bug such as      coag-negative staph line infection.  The patient's PICC line was  removed upon admission due to some mild erythema at the entry site      and question of line infection and was replaced with a peripheral      IV.  She has been continued on IV vancomycin and per ID      recommendations, he recommended an additional dose of vancomycin      this morning prior to discharge and then discharge from the      hospital with outpatient lab checks and a joint reimplantation as      per Dr. Mayford Knife original plan.   MEDICATIONS AT TIME OF DISCHARGE:  1. Lisinopril HCT 20/25 1 tablet p.o. daily.  2. Lipitor 40 mg p.o. daily.  3. Ocuvite 1 tablet p.o. daily.  4. Aspirin 81 mg p.o. daily  5. Percocet 5/325 1-2 tablets every 4 hours as needed.  6. Ambien 5 mg p.o. daily in the evening.   DISPOSITION:  The patient will be discharged to home.  She is instructed  to follow up with Dr. Tawanna Cooler 1-2 weeks and contact the office for an  appointment.  She is instructed to follow up with Dr. Priscille Kluver next week  as scheduled for labs check.  Tentative plans at this time are for the  knee surgery on August 18 per Dr. Priscille Kluver.  In addition the patient is  instructed follow up with Dr. Darlina Sicilian in one to two weeks and contact  the office for appointment.  She  is instructed to call Dr. Maurice March should  she develop recurrent diarrhea, worsening knee swelling, redness or  increased pain or if she develops fever over 100.      Sandford Craze, NP      Raenette Rover. Felicity Coyer, MD  Electronically Signed    MO/MEDQ  D:  06/06/2007  T:  06/06/2007  Job:  [id number]   cc:   Tinnie Gens A. Tawanna Cooler, MD  Carlisle Beers. Rendall, M.D.  Fransisco Hertz, M.D.

## 2011-03-21 NOTE — Letter (Signed)
April 06, 2010   Tinnie Gens A. Tawanna Cooler, MD  [address] [place], [place] [address]   Re:  KARRYN, KOSINSKI                  DOB:  18-Nov-1945   Dear Trey Paula:   I saw the patient back today.  She continues to have problems with  abscesses and antibiotics and she has had C. difficile, but right now  her knee is stabilized.  Her blood pressure was 113/66, pulse 94,  respirations 18, and sats were 97%.  We have been following for over 2  years for her left lung nodules and they remained stable.  Although,  this is on the preliminary report, I will wait for the final report.  Because there has been no change in over 2 years, I will refer her back  to you for long-term followup.  I would suggest she get a chest x-ray at  least every year or every other year.  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   Sincerely,   Ines Bloomer, M.D.  Electronically Signed   DPB/MEDQ  D:  04/06/2010  T:  04/06/2010  Job:  [id number]

## 2011-03-21 NOTE — Discharge Summary (Signed)
NAME:  DOLL, FRAZEE NO.:  [id number]   MEDICAL RECORD NO.:  [id number]          PATIENT TYPE:  INP   LOCATION:  5001                         FACILITY:  [facility]   PHYSICIAN:  John L. Rendall, M.D.  DATE OF BIRTH:  07-16-1946   DATE OF ADMISSION:  05/03/2007  DATE OF DISCHARGE:  05/08/2007                               DISCHARGE SUMMARY   ADDENDUM:   DISCHARGE MEDICATIONS:  This was actually left off the original  Discharge Summary:  1. Arixtra 2.5 mg subcutaneously at 8 a.m. with the last dose to be on      May 09, 2007.  2. On May 10, 2007, she is to start 1 baby aspirin a day by mouth for      DVT prophylaxis.      Legrand Pitts Duffy, P.A.      John L. Rendall, M.D.  Electronically Signed    KED/MEDQ  D:  05/08/2007  T:  05/08/2007  Job:  [id number]

## 2011-03-21 NOTE — Discharge Summary (Signed)
NAME:  Lynn [name], LOUIS NO.:  [id number]   MEDICAL RECORD NO.:  [id number]          PATIENT TYPE:  INP   LOCATION:  5001                         FACILITY:  [facility]   PHYSICIAN:  John L. Rendall, M.D.  DATE OF BIRTH:  [birth year]   DATE OF ADMISSION:  05/03/2007  DATE OF DISCHARGE:                               DISCHARGE SUMMARY   ADMISSION DIAGNOSES:  1. 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  2. Hypertension.  3. Hypercholesterolemia.  4. Macular degeneration.  5. Depression.  6. Type 2 diabetes mellitus, diet controlled.  7. Left subclavian artery blockage.  8. Possible benign positional vertigo.  9. Obesity.   DISCHARGE DIAGNOSES:  1. Loose right total knee arthroplasty secondary to infection with      streptococcus.  2. Acute blood loss anemia secondary to surgery.  3. Hypotension, now resolved.  4. Constipation, now resolved.  5. Hypertension.  6. Hypercholesterolemia.  7. Macular degeneration.  8. Depression.  9. Type 2 diabetes mellitus.  10.Left subclavian artery blockage.  11.Benign positional vertigo.  12.Obesity.   SURGICAL PROCEDURE:  On May 03, 2007, Ms. Lynn [name] underwent an excision  arthroplasty of her chronically-infected right total knee with placement  of an antibiotic spacer by Dr. Jonny Ruiz L. Rendall, assisted by Arnoldo Morale,  PA-C.   COMPLICATIONS:  None.   CONSULTS:  1. Infectious disease consult by Dr. Darlina Sicilian on May 03, 2007.  2. Physical therapy consult May 04, 2007.  3. Social work consult May 06, 2007.   HISTORY OF PRESENT ILLNESS:  This 65 year old white female patient  presented to Dr. Priscille Kluver with a history of a right knee replacement done  by him in 1998.  Her postoperative course was complicated with  arthrofibrosis requiring closed manipulation.  She continued to have  pain since surgery but it came on gradually and got progressively worse.  She had multiple workups for infection which were subsequently  negative.   At this point, the pain is constant, worse with weightbearing.  It is an  ache to sharp sensation over the medial and lateral joint line without  radiation.  It increases with standing, walking or range of motion;  decreases with rest and elevation.  The knee gives way.  It swells,  keeps her up at night, but there is no popping, catching, grinding, or  locking.  She has failed conservative treatment, and because of this she  is presenting for a revision of her right total knee arthroplasty.   HOSPITAL COURSE:  Ms. Lynn [name] tolerated her surgical procedure well, but  during the surgery a Gram stain from a cyst found underneath the tibia  showed gram-positive cocci in pairs and clusters.  Because of this, the  total revision of the knee was halted and an antibiotic spacer was  placed due to the infection.  Infectious disease was consulted  immediately after surgery and they followed her the rest of her  hospitalization.  She tolerated surgery without difficulty and was  transferred to 5000.  Due to the infection, Dr. Maurice March did switch her from  IV Ancef to vancomycin.   On  postoperative day #1, T-max is 98.5, pulse 70, BP 85/45.  Hemoglobin  10.5, hematocrit 31.6.  Leg was neurovascularly intact.  She was a bit  anxious, so she was started on some Ativan for agitation.  She was  started on therapy per protocol.   Postoperative day #2, she was feeling much better.  Still had some  episodes of blood pressure dropping.  Hemoglobin was 9.3, hematocrit  27.7.  Right knee wound was well approximated with moderate swelling.  So far, cultures had not grown anything so she was continued on  vancomycin.  She was having some difficulty with constipation that was  treated with a laxative.   Postoperative day #3, continues to do well but was having some dizziness  with standing.  Again was noted to have low blood pressure so she was  subsequently transfused with 1 unit of packed red blood  cells.  Her  hemoglobin was 9.1, hematocrit 26.9.  The anaerobic culture did show  rare gram-positive cocci in pairs.  At that point during the day, Dr.  Maurice March saw the patient and looked at the culture himself.  He felt this  was a strep organism and because of that, he felt discontinuing the  vancomycin and switching her to 2 g of IV ceftriaxone q.24h. for 4 weeks  would be the appropriate treatment.  That was started at that time.   She continued to make good progress over the next several days.  Her  blood pressure improved after the transfusion and she stopped having the  dizziness when out of bed.  A medication line was able to be placed for  her long-term IV antibiotic therapy on July 1.  It is felt now on July 2  she is ready for transfer to the skilled facility.  Her T-max is 99.2,  vital signs are stable, leg is neurovascularly intact.  She is  tolerating transfers with that leg and minimal ambulation, and it is  felt she is ready for transfer today.   DISCHARGE INSTRUCTIONS:  Diet:  She can continue a regular diet.  She is  a diabetic but uses a regular diet at home and just adjusts what she  eats.   Activity:  She can be out of bed, weightbearing as tolerated on that  right leg with the use of the walker.  She is probably not going to  tolerate much weight, so ambulation may be limited.  She does not need  any CPM or any active range of motion with that knee.  Therapy needs to  work just on her transfers, mobility, and minimal ambulation around the  house.  No total knee protocol at this time due to the antibiotic spacer  in place.   Medications:  1. Rocephin 2 g IV q.24h. for a total of 4 weeks of therapy.  This was      started on May 06, 2007.  2. Lisinopril/hydrochlorothiazide 20/25 mg one tablet p.o. q.a.m.  3. Lipitor 40 mg one tablet p.o. q.h.s.  4. Citalopram 20 mg one tablet p.o. q.h.s.  5. Ambien 5 mg p.o. q.h.s. p.r.n. insomnia.  6. Fish oil 1000 mg p.o.  q.a.m.  7. Ocuvite one tablet p.o. b.i.d.  8. Colace 100 mg p.o. b.i.d.  9. Senokot one tablet p.o. b.i.d. a.c.  10.She was on sliding scale insulin while at this facility.  You may      use your own sliding scale but she was getting for a CBG of 101-  150, 2 units of regular insulin; 151-200, 3 units; 201-250, 5      units; 251-300, 7 units; 301-350, 9 units; greater than 350, 11      units; greater than 400, STAT labs and call MD.  She was just      getting this a.c.  11.Celebrex 200 mg p.o. b.i.d.  She needs to continue this for one      more week and then drop it down to one p.o. q.a.m.  12.Protonix 40 mg p.o. q.a.m.  13.Tylenol one to two tablets p.o. q.4h. p.r.n. temperature greater      than 101.5.  14.Robaxin 500 mg one to two tablets p.o. q.6h. p.r.n. for spasms.  15.Percocet one to two tablets p.o. q.4h. p.r.n. for pain.  16.Ativan 0.5 mg to 1 mg p.o. q.8h. p.r.n. anxiety.  17.Enema of choice/laxative of choice p.r.n. constipation.  18.Please flush medication line in the right arm per indwelling IV      catheter protocol.   Wound care:  Please clean the right knee incision with Betadine daily.  It can remain without a dressing as long as there is no drainage from  it.  She may shower after no drainage from the wound in 2 days.  Please  notify Dr. Priscille Kluver of temperature greater than 101.5, chills, pain  unrelieved by pain medications, or foul-smelling drainage from the  wound.   Followup:  She needs to follow up with one of the doctors in our office,  Dr. Madelon Lips, on May 16, 2007, and you need to call [phone] for that  appointment.  She will then follow up with Dr. Priscille Kluver in our office 2  weeks after that visit.  Dr. Priscille Kluver will be out of town on July 10,  which is why she will follow up with Dr. Madelon Lips.  She needs to follow  up with infectious disease per their office.   LABORATORY DATA:  Hemoglobin and hematocrit ranged from 15 and 44.7 on  June 23, to 9.3 and  27.7 on June 29, to 9.1 and 26.9 on June 30.  White  count ranged from 9.7 on June 23 to 6.9 on June 30.  Platelet count  remained within normal limits.  Sodium dropped to a low of 134 on June  28 with a potassium of 3.4.  It then remained within normal limits.  Glucose ranged from 147 on June 23 to 138 on June 29.  All other  laboratory studies were within normal limits.      Legrand Pitts Duffy, P.A.      John L. Rendall, M.D.  Electronically Signed    KED/MEDQ  D:  05/08/2007  T:  05/08/2007  Job:  [id number]   cc:   Tinnie Gens A. Tawanna Cooler, MD

## 2011-03-21 NOTE — H&P (Signed)
NAME:  Lynn [name], Lynn NO.:  [id number]   MEDICAL RECORD NO.:  [id number]          PATIENT TYPE:  INP   LOCATION:  NA                           FACILITY:  [facility]   PHYSICIAN:  John L. Rendall, M.D.  DATE OF BIRTH:  June 12, 1946   DATE OF ADMISSION:  05/03/2007  DATE OF DISCHARGE:                              HISTORY & PHYSICAL   CHIEF COMPLAINT:  Right knee pain for the last 10 years, worse over the  last four.   HISTORY OF PRESENT ILLNESS:  A 65 year old white female patient  presented to Dr. Priscille [name] with a history of a right knee replacement done  by him in 1998.  Her postoperative course was complicated with  arthrofibrosis which required a closed manipulation.  She reports she  always had pain after the initial surgery, but it kind of came on  gradually and then got progressively worse over the last 4 years without  any history of injury.   At this point, the pain is pretty much constant, but seems to be worse  with weightbearing.  Described as an ache to sharp sensation over the  medial and lateral joint line without radiation.  Pain increases with  standing, walking or range of motion, then decreases with rest,  elevation and meds.  She is currently taking Vicodin Extra Strength,  probably 2 to 4 a day for pain.  The knee does give way.  It swells.  It  keeps her up at night, but there is no popping, catching, grinding or  locking.  She has been ambulating with a cane for the last 3 months, and  her knee did give way on her and caused her to break her right wrist.  She has had a workup for infection that was negative.   ALLERGIES:  DILAUDID CAUSED SEDATION.   CURRENT MEDICATIONS:  1. Lisinopril/HCTZ 20/25 mg 1 tablet p.o. q.a.m.  2. Aspirin 81 mg 1 tablet p.o. q.a.m., last dose June 17.  3. Fish oil 1,000 mg 1 tablet p.o. q.a.m.  4. Natures pearls 1 tablet p.o. q.a.m.  5. Lipitor 40 mg 1 tablet p.o. q.h.s.  6. Lexapro 20 mg 1 tablet p.o. q.h.s.  7.  Ocuvite 1 tablet p.o. b.i.d.  8. Colace 100 mg 1 tablet p.o. b.i.d.  9. Vicodin ES one p.o. q.4 h p.r.n. for pain.  10.Ambien 5 mg 1 tablet p.o. q.h.s.  11.Darvocet N 100 1-2 tablets p.o. q. 4 to 6 hours p.r.n. for pain.   PAST MEDICAL HISTORY:  1. Hypertension.  2. Hypercholesterolemia.  3. Macular degeneration.  4. Depression.  5. Type 2 diabetes treated only with diet and exercise.  She tried low      dose oral meds, and that dropped her sugar too low.  6. She has a left subclavian artery blockage, so she is to have no      blood pressures on her left arm.  7. Possible benign positional vertigo.   PAST SURGICAL HISTORY:  1. Tonsillectomy and adenoidectomy 1953.  2. Cholecystectomy 1985.  3. Cervical fusion 1996.  4. Right knee arthroscopy 1994.  5. Right  total knee arthroplasty by Dr. Jonny [name] L.  Rendall 1998.  6. Laser surgery to the cervix 1993.  7. Laser surgery to the cervix 2001.   The only complication she reports from surgery is she did have sepsis  after her cholecystectomy.   She has a 68 pack-year history of cigarette smoking, which she quit 8  years ago.  She does drink alcohol probably 4 times a year.  She does  not use any drugs.  She is single and lives by herself in a one-story  house.  She has no children.  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  Her medical doctor is Dr. Alonza [name] with [facility].   FAMILY HISTORY:  Mother died at age of 84 with heart disease,  hypertension, strokes and breast cancer.  Father died at the age of 67  with heart disease, heart attack and high blood pressure.  Grandparents  had a history of heart disease, hypertension, diabetes and kidney  disease.   REVIEW OF SYSTEMS:  She does get bronchitis fairly frequently, the last  being last winter.  She has the hypertension and diabetes which we  discussed previously.  She did have asthma when she smoked, but that is  resolved since she quit smoking.  All other systems  were negative and  noncontributory.   PHYSICAL EXAM:  GENERAL:  Well-developed, well-nourished, mildly  overweight white female in no acute distress.  Talks easily with  examiner.  Does walk with an antalgic gait and a limp and the use of a  cane.  Mood and affect are appropriate.  Height 5 feet 8 inches, weight  226 pounds, BMI is 33.  VITAL SIGNS:  Temperature 99.3 degrees Fahrenheit, pulse 96,  respirations 14 and BP 108/58.  HEENT:  Normocephalic, atraumatic without frontal or maxillary sinus  tenderness to palpation.  Conjunctiva pink.  Sclerae anicteric.  PERLA.  EOMs intact.  No visible external ear deformities.  Hearing grossly  intact.  Tympanic membranes pearly gray bilaterally with good light  reflex.  Nose and nasal septum midline.  Nasal mucosa pink and moist  without exudates or polyps noted.  Buccal mucosa pink and moist.  Dentition in good repair.  Pharynx without erythema or exudates.  Tongue  and uvula midline.  Tongue without fasciculations, and uvula rises  equally with phonation.  NECK:  She has a well-healed low left neck incision line.  No erythema  or ecchymosis.  She has full range of motion and nontender to palpation  along her cervical spine.  She has no palpable lymphadenopathy nor  thyromegaly.  Carotids +2 bilaterally with a bruit on the left side.  CARDIOVASCULAR:  Heart rate and rhythm regular.  S1-S2 present without  rubs, clicks or murmurs noted.  RESPIRATORY:  Respirations even and unlabored.  Breath sounds clear to  auscultation bilaterally without rales or wheezes noted.  ABDOMEN:  Rounded abdominal contour.  Bowel sounds present x4 quadrants.  Soft, nontender to palpation without hepatosplenomegaly nor CVA  tenderness.  Femoral pulses +2 bilaterally.  Nontender to palpation  along the vertebral column.  BREAST/GU/RECTAL/PELVIC:  These exams deferred at this time.  MUSCULOSKELETAL:  No obvious deformities bilateral upper extremities with full  range of motion of these extremities without pain.  Radial  pulse +2 bilaterally.  Full range of motion of the hips, ankles and toes  bilaterally.  DP and PT pulses +2.  No calf pain with palpation.  Negative Homans' sign bilaterally.  LEFT KNEE:  Has full extension and flexion to 105 degrees without  crepitus.  There is no pain with palpation along the joint line.  No  effusion.  Stable to varus and valgus stress.  Negative anterior drawer.  Right knee has a well-healed midline incision.  No erythema or  ecchymosis.  She has full extension but flexion right now only to 60  degrees with pain.  She is diffusely tender with palpation about the  joint line.  Maybe a small effusion.  Stable to varus and valgus stress.  Negative anterior drawer.  NEUROLOGIC:  Alert and oriented x3.  Cranial nerves II-XII are grossly  intact.  Strength 5/5 bilateral upper and lower extremities.  Rapid  alternating movements intact.  Deep tendon reflexes 2+ bilateral upper  and lower extremities.  Sensation intact to light touch.   RADIOLOGIC FINDINGS:  X-rays taken of her right knee on Mar 26, 2007  show tibial tray has sunk into the tibia and appears to be very loose.  There is a very little cement mantle.  There also some cysts noted under  the femoral component.   IMPRESSION:  1. 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  2. Hypertension.  3. Hypercholesterolemia.  4. Macular degeneration.  5. Depression.  6. Type 2 diabetes mellitus.  7. Left subclavian artery blockage.  8. Possible benign positional vertigo.  9. Obesity.   PLAN:  Ms. Livingston will be admitted to [facility] on May 03, 2007, where she will undergo a revision of her right total knee  arthroplasty.  She will undergo all the routine preoperative laboratory  tests and studies prior to this procedure.  If we have any medical  issues while she is hospitalized, we will consult Dr. Tawanna Cooler.      Legrand Pitts Duffy,  P.A.      John L. Rendall, M.D.  Electronically Signed    KED/MEDQ  D:  04/29/2007  T:  04/29/2007  Job:  [id number]

## 2011-03-21 NOTE — H&P (Signed)
NAME:  Lynn [name], Lynn [name] NO.:  [id number]   MEDICAL RECORD NO.:  [id number]          PATIENT TYPE:  INP   LOCATION:  NA                           FACILITY:  [facility]   PHYSICIAN:  John L. Rendall, M.D.  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   HISTORY OF PRESENT ILLNESS:  This 65 year old white female patient  presented to Dr. Priscille Kluver with a history of a right knee replacement done  by him in November of 1998.  She had had continued pain of the knee and  in June of this year was scheduled for a revision right total knee.  The  revision was done all the way to the point of just about putting in the  final components when a cyst had been found and sent for culture that  showed bacteria.  At that point an antibiotic spacer was placed and the  knee was closed.  The cyst did grow out group A strep and she was  treated with IV Rocephin for a total of 6 weeks.  She then unfortunately  got admitted with sepsis from a possible infected PICC line but that has  been treated effectively with vancomycin and since then her sed rate and  C-reactive protein have been dropping.  We feel she is stable for now  removal of the antibiotic spacer and placement of a revision total knee  arthroplasty.   The antibiotic spacer at this time has minimal movement.  She has been  in a knee immobilizer weightbearing as tolerated. The knee has a fair  amount of pain with any range of motion.   No known drug allergies.   CURRENT MEDICATIONS:  1. Lisinopril/hydrochlorothiazide 20/25 mg half a tablet p.o. q.a.m.  2. Aspirin 81 mg 1 tablet p.o. q.a.m. last dose July 11, 2007.  3. Fish oil 1000 mg 1 tablet p.o. q.a.m. last dose August 29.  4. Nature's Pearls 1 tablet p.o. q.a.m.  5. Lipitor 40 mg 1 tablet p.o. q.h.s.  6. Lexapro 20 mg 1  tablet p.o. q.h.s.  7. Ocuvite 1 tablet p.o. b.i.d.  8. Colace 100 mg 1 tablet p.o. b.i.d.  9. Percocet 5/325 mg 1 tablet p.o. t.i.d.  10.Ambien 5 mg 1 tablet p.o. q.h.s.  11.Pepcid AC 30 mg 1 tablet p.o. q.a.m.   PAST MEDICAL HISTORY:  1. Hypertension.  2. Hypercholesterolemia.  3. Macular degeneration.  4. Depression.  5. Type 2 diabetes treated with diet and exercise.  6. Left subclavian artery blockage so no blood pressures or sticks on      her left arm.  7. Benign positional vertigo.   PAST SURGICAL HISTORY:  1. Tonsillectomy and adenoidectomy, 1953.  2. Cholecystectomy, 1985.  3. Cervical fusion, 1996.  4. Right knee arthroscopy, 1994.  5. Right total knee arthroplasty by Dr. Jonny Ruiz L.  Rendall, 1998.  6. Laser surgery to the cervix, 1993.  7. Laser surgery to the cervix, 2001.  8. Excision arthroplasty and  placement antibiotic spacer of right knee      by Dr. Jonny Ruiz L. Rendall, May 03, 2007.   The only complication from surgery is sepsis after her cholecystectomy.   SOCIAL HISTORY:  She has a 68 pack-year history of cigarette smoking  which she quit 8 years ago.  She drinks alcohol maybe four times a year  and does not use any drugs.  She is single and lives by herself in a one-  story house.  She has no children.  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  Her medical doctor is Dr. Alonza Smoker with [facility].   FAMILY HISTORY:  Mother died at the age of 36 with heart disease,  hypertension, stroke and breast cancer.  Father died at the age of 81  with heart disease, heart attack and high blood pressure.  Grandparents  had a history of heart disease, hypertension, diabetes and kidney  disease.   REVIEW OF SYSTEMS:  She does not have a history of bronchitis frequently  but the last time being in the winter.  She has hypertension and  diabetes which is currently well controlled.  Her blood pressure meds  have been dropped recently cause her BP has been low  and she may be able  to hold them altogether.  She does have a history of whooping cough.  She has asthma when she smokes.  Does have a history of kidney  infections in the past, nothing recent.  She was hospitalized with  sepsis from July 27 to August 1 of this year and they felt that was due  to her PICC line and was treated with IV vancomycin.  All other systems  are negative and noncontributory.   PHYSICAL EXAM:  A well-developed, well-nourished, mildly overweight  white female in no acute distress. Talks easily with examiner.  Mood and  affect are appropriate. She is resting in a wheelchair with the right  leg in a knee immobilizer.  Height 5 feet 8 inches, weight 208 pounds,  BMI is 30.  VITAL SIGNS:  Temperature 99.3 degrees Fahrenheit, pulse 80,  respirations 14 and BP 82/60.  HEENT:  Normocephalic, atraumatic without frontal or maxillary sinus  tenderness to palpation.  Conjunctiva pink.  Sclerae anicteric.  PERLA.  EOMs intact.  No visible external ear deformities.  Hearing grossly  intact.  Nose and nasal septum midline.  Nasal mucosa pink and moist  without exudates or polyps noted.  Buccal mucosa pink and moist.  Dentition in good repair.  Pharynx without erythema or exudates.  Tongue  and uvula midline.  Tongue without fasciculations and uvula rises  equally with phonation.  NECK:  She has a well-healed low left neck incision line.  No erythema  or ecchymosis.  Full range of motion of the cervical spine without pain.  No palpable lymphadenopathy nor thyromegaly.  Carotids +2 bilaterally  with debris noted on the left side.  CARDIOVASCULAR:  Heart rate and rhythm regular.  S1 and S2 present  without rubs, clicks or murmurs noted.  RESPIRATORY:  Respirations even and unlabored.  Breath sounds clear to  auscultation bilaterally without rales or wheezes noted.  ABDOMEN:  Rounded abdominal contour.  Bowel sounds present x4 quadrants.  Soft, nontender to palpation without  hepatosplenomegaly nor CVA  tenderness.  Femoral pulses +2 bilaterally.  Nontender to palpation  along the vertebral column.  BREAST/GU/RECTAL/PELVIC:  These exams deferred at this time.  MUSCULOSKELETAL:  No obvious deformities bilateral upper  extremities  with full range of motion these extremities without pain.  Radial pulses  +2 bilaterally.  She has full range of motion of her hips, ankles and  toes bilaterally.  DP and PT pulses +2, no calf pain with palpation.  Negative Homans' sign bilaterally.  The left knee has full extension and flexion to about 100-105 degrees  without crepitus.  There is no pain with palpation along the joint line.  No effusion.  Stable to varus and valgus stress.  Negative anterior  drawer.  The right knee has a well-healed midline incision.  The knee is  slightly warm.  She is lacking maybe 5-10 degrees of full extension and  has flexion only about 20-30 degrees.  Did not check varus or valgus  stress with the spacer in place.  SKIN:  Otherwise intact without erythema or ecchymosis, nicely healed  incision.  NEUROLOGIC:  Alert and oriented x3.  Cranial nerves II-XII are grossly  intact.  Strength 5/5 bilateral upper and lower extremities.  Rapid  alternating movements intact.  Deep tendon reflexes 2+ bilateral upper  and lower extremities.  Sensation intact to light touch.   LABORATORY DATA:  Sed rate and C-reactive protein done on August 21 show  a sed rate of 28 and a C-reactive protein of 2.2 which had been  decreasing from her previous lab work.  She had new labs drawn on  September 4.   IMPRESSION:  1. 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  2. Hypertension.  3. Hypercholesterolemia.  4. Macular degeneration.  5. Depression.  6. Type 2 diabetes mellitus.  7. Left subclavian artery blockage.  8. Benign positional vertigo.  9. Obesity.  10.Mild gastroesophageal reflux disease.    PLAN:  Ms. Midkiff will be admitted to [facility] on July 22, 2007 where she will undergo removal of her antibiotic spacer and a  revision right total knee arthroplasty by Dr. Jonny Ruiz L.  Rendall.  She  will undergo all the routine laboratory tests and studies prior to this  procedure.  If we have any medical issues while she is hospitalized we  will consult [place] hospitalists.      Legrand Pitts Duffy, P.A.      John L. Rendall, M.D.  Electronically Signed    KED/MEDQ  D:  07/11/2007  T:  07/11/2007  Job:  [id number]

## 2011-03-24 NOTE — Assessment & Plan Note (Signed)
[facility]                                   ON-CALL NOTE   NAME:Lynn [name], Lynn [name]                     MRN:          [id number]  DATE:08/25/2006                            DOB:          August 11, 1946    PRIMARY:  Dr. Tawanna Cooler.   PHONE NUMBER:  [phone]   SUBJECTIVE:  Dog bite, the patient had tetanus 3 years ago but is wondering  if that is okay.   ASSESSMENT AND PLAN:  The patient informed that it was okay and that she  would likely be covered, but the wound needs to be irrigated and she may  consider going to an urgent care for irrigation and possible repair.       Kerby Nora, MD      AB/MedQ  DD:  08/26/2006  DT:  08/27/2006  Job #:  [id number]   cc:   Tinnie Gens A. Tawanna Cooler, MD

## 2011-03-27 ENCOUNTER — Telehealth: Payer: Self-pay | Admitting: *Deleted

## 2011-03-27 MED ORDER — GLUCOSE BLOOD VI STRP: 1.0000 | ORAL_STRIP | Freq: Four times a day (QID) | Status: DC

## 2011-03-27 NOTE — Telephone Encounter (Signed)
[facility] Triage Call Report Triage Record Num: [id number] Operator: Arline Asp Loftin Patient Name: Lynn [name] Call Date & Time: 03/25/2011 1:44:37PM Patient Phone: [phone] PCP: Eugenio Hoes. Todd Patient Gender: Female PCP Fax : [phone] Patient DOB: 29-Dec-1945 Practice Name: [facility] Reason for Call: Pt calling for refill of One Touch Test Strips, [facility] them [facility] at [phone]. States she "tests 2-4 times per day." Per SO, authorized refill of "One Touch Test Strips, as directed, disp #1 box of 100, NR." Evelena Peat MD on call. Pt to follow up with office for new Rx. Protocol(s) Used: Medication Question Calls, No Triage (Adults) Recommended Outcome per Protocol: Provide Information or Advice Only Reason for Outcome: Caller requesting a refill, no triage required and triager able to refill per unit policy Care Advice: ~ 03/25/2011 1:53:07PM Page 1 of 1 [email or web address]

## 2011-04-24 ENCOUNTER — Telehealth: Payer: Self-pay | Admitting: *Deleted

## 2011-04-24 DIAGNOSIS — E785 Hyperlipidemia, unspecified: Secondary | ICD-10-CM

## 2011-04-24 NOTE — Telephone Encounter (Signed)
Please fax lipitor rx to [facility] and not [facility].

## 2011-04-25 MED ORDER — ATORVASTATIN CALCIUM 40 MG PO TABS: 40.0000 mg | ORAL_TABLET | Freq: Every day | ORAL | Status: DC

## 2011-05-22 ENCOUNTER — Telehealth: Payer: Self-pay | Admitting: Family Medicine

## 2011-05-22 DIAGNOSIS — E119 Type 2 diabetes mellitus without complications: Secondary | ICD-10-CM

## 2011-05-22 DIAGNOSIS — I10 Essential (primary) hypertension: Secondary | ICD-10-CM

## 2011-05-22 DIAGNOSIS — L309 Dermatitis, unspecified: Secondary | ICD-10-CM

## 2011-05-22 DIAGNOSIS — F329 Major depressive disorder, single episode, unspecified: Secondary | ICD-10-CM

## 2011-05-22 DIAGNOSIS — F3289 Other specified depressive episodes: Secondary | ICD-10-CM

## 2011-05-22 MED ORDER — METFORMIN HCL 500 MG PO TABS: 500.0000 mg | ORAL_TABLET | Freq: Every day | ORAL | Status: DC

## 2011-05-22 MED ORDER — CITALOPRAM HYDROBROMIDE 20 MG PO TABS: 20.0000 mg | ORAL_TABLET | Freq: Every day | ORAL | Status: DC

## 2011-05-22 MED ORDER — TRIAMCINOLONE ACETONIDE 0.1 % EX CREA: TOPICAL_CREAM | Freq: Two times a day (BID) | CUTANEOUS | Status: DC

## 2011-05-22 MED ORDER — LISINOPRIL-HYDROCHLOROTHIAZIDE 20-25 MG PO TABS: 1.0000 | ORAL_TABLET | Freq: Every day | ORAL | Status: DC

## 2011-05-22 NOTE — Telephone Encounter (Signed)
Left message on machine for patient  That rx was sent to [facility].

## 2011-05-22 NOTE — Telephone Encounter (Signed)
Patients prescription for lisinopeil, Metformin and Citalopram(patient was not sure if there was more) were [facility] in error should have been sent to [facility]. Patient canceled the Prescriptions that were [facility] to [facility] but need them faxed to [facility] today.

## 2011-08-14 ENCOUNTER — Ambulatory Visit: Payer: Medicare Other | Attending: Orthopedic Surgery

## 2011-08-14 DIAGNOSIS — IMO0001 Reserved for inherently not codable concepts without codable children: Secondary | ICD-10-CM | POA: Insufficient documentation

## 2011-08-14 DIAGNOSIS — M25519 Pain in unspecified shoulder: Secondary | ICD-10-CM | POA: Insufficient documentation

## 2011-08-14 DIAGNOSIS — M25619 Stiffness of unspecified shoulder, not elsewhere classified: Secondary | ICD-10-CM | POA: Insufficient documentation

## 2011-08-17 LAB — CBC
HCT: 24.7 — ABNORMAL LOW
HCT: 28.2 — ABNORMAL LOW
HCT: 32.3 — ABNORMAL LOW
Hemoglobin: 10.7 — ABNORMAL LOW
Hemoglobin: 8.3 — ABNORMAL LOW
MCHC: 33.5
MCV: 84.3
MCV: 84.4
MCV: 85.7
Platelets: 169
Platelets: 177
RBC: 2.93 — ABNORMAL LOW
RBC: 3.42 — ABNORMAL LOW
RDW: 15 — ABNORMAL HIGH
WBC: 5.1
WBC: 6.8
WBC: 7.4
WBC: 9.3

## 2011-08-17 LAB — BASIC METABOLIC PANEL
BUN: 5 — ABNORMAL LOW
BUN: 6
CO2: 29
Calcium: 8.7
Chloride: 103
Chloride: 107
GFR calc Af Amer: >60
GFR calc non Af Amer: >60
GFR calc non Af Amer: >60
Glucose, Bld: 139 — ABNORMAL HIGH
Potassium: 4
Potassium: 4
Potassium: 4.6
Sodium: 135
Sodium: 141

## 2011-08-17 LAB — CROSSMATCH

## 2011-08-17 LAB — TISSUE CULTURE: Gram Stain: NONE SEEN

## 2011-08-17 LAB — ANAEROBIC CULTURE: Gram Stain: NONE SEEN

## 2011-08-17 LAB — HEMOGLOBIN A1C
Hgb A1c MFr Bld: 8 — ABNORMAL HIGH
Mean Plasma Glucose: 208

## 2011-08-18 LAB — CBC
HCT: 39.8
Hemoglobin: 13.2
MCHC: 33.1
Platelets: 241
RDW: 15.4 — ABNORMAL HIGH

## 2011-08-18 LAB — URINALYSIS, ROUTINE W REFLEX MICROSCOPIC
Bilirubin Urine: NEGATIVE
Hgb urine dipstick: NEGATIVE
Nitrite: NEGATIVE
Protein, ur: NEGATIVE
Specific Gravity, Urine: 1.018
Urobilinogen, UA: 0.2

## 2011-08-18 LAB — DIFFERENTIAL
Basophils Absolute: 0
Basophils Relative: 1
Eosinophils Absolute: 0.1
Neutro Abs: 5.3
Neutrophils Relative %: 67

## 2011-08-18 LAB — COMPREHENSIVE METABOLIC PANEL
ALT: 15
Alkaline Phosphatase: 125 — ABNORMAL HIGH
BUN: 9
CO2: 29
Chloride: 100
GFR calc non Af Amer: >60
Glucose, Bld: 125 — ABNORMAL HIGH
Potassium: 4.1
Total Bilirubin: 0.7

## 2011-08-18 LAB — ABO/RH: ABO/RH(D): O POS

## 2011-08-18 LAB — PROTIME-INR: INR: 1

## 2011-08-18 LAB — CROSSMATCH

## 2011-08-18 LAB — APTT: aPTT: 28

## 2011-08-18 LAB — URINE CULTURE: Colony Count: 50000

## 2011-08-21 LAB — CULTURE, BLOOD (ROUTINE X 2): Culture: NO GROWTH

## 2011-08-21 LAB — URINALYSIS, ROUTINE W REFLEX MICROSCOPIC
Bilirubin Urine: NEGATIVE
Glucose, UA: NEGATIVE
Hgb urine dipstick: NEGATIVE
Protein, ur: NEGATIVE
Urobilinogen, UA: 0.2

## 2011-08-21 LAB — BASIC METABOLIC PANEL
CO2: 27
Calcium: 8.9
Chloride: 101
Chloride: 106
GFR calc Af Amer: >60
GFR calc non Af Amer: >60
Glucose, Bld: 100 — ABNORMAL HIGH
Glucose, Bld: 142 — ABNORMAL HIGH
Potassium: 3.7
Potassium: 4.1
Sodium: 134 — ABNORMAL LOW
Sodium: 137

## 2011-08-21 LAB — URINE CULTURE
Colony Count: 30000
Special Requests: NEGATIVE

## 2011-08-21 LAB — CBC
HCT: 32.6 — ABNORMAL LOW
HCT: 33.5 — ABNORMAL LOW
Hemoglobin: 10.7 — ABNORMAL LOW
Hemoglobin: 10.9 — ABNORMAL LOW
MCHC: 32.7
MCV: 86
MCV: 86.3
RBC: 3.89
RDW: 15.1 — ABNORMAL HIGH
RDW: 15.4 — ABNORMAL HIGH
WBC: 6.3

## 2011-08-21 LAB — URINE MICROSCOPIC-ADD ON

## 2011-08-21 LAB — PROTIME-INR: INR: 1.1

## 2011-08-23 ENCOUNTER — Telehealth: Payer: Self-pay | Admitting: Family Medicine

## 2011-08-23 LAB — ABO/RH: ABO/RH(D): O POS

## 2011-08-23 LAB — APTT: aPTT: 27

## 2011-08-23 LAB — BASIC METABOLIC PANEL
BUN: 14
BUN: 16
Calcium: 8 — ABNORMAL LOW
Chloride: 99
Creatinine, Ser: 0.81
Creatinine, Ser: 0.85
GFR calc Af Amer: >60
GFR calc non Af Amer: >60
GFR calc non Af Amer: >60
Glucose, Bld: 133 — ABNORMAL HIGH
Potassium: 3.4 — ABNORMAL LOW

## 2011-08-23 LAB — CBC
HCT: 26.9 — ABNORMAL LOW
HCT: 31.6 — ABNORMAL LOW
Hemoglobin: 15
Hemoglobin: 9.1 — ABNORMAL LOW
MCHC: 33.5
MCV: 89
MCV: 89.4
MCV: 89.8
Platelets: 119 — ABNORMAL LOW
Platelets: 121 — ABNORMAL LOW
Platelets: 154
RBC: 3.01 — ABNORMAL LOW
RBC: 3.09 — ABNORMAL LOW
RBC: 5.02
RDW: 13.6
RDW: 13.9
WBC: 6.9
WBC: 8.8

## 2011-08-23 LAB — ANAEROBIC CULTURE

## 2011-08-23 LAB — CROSSMATCH
ABO/RH(D): O POS
Antibody Screen: NEGATIVE

## 2011-08-23 LAB — COMPREHENSIVE METABOLIC PANEL
BUN: 21
CO2: 26
Calcium: 10.3
Creatinine, Ser: 0.7
GFR calc non Af Amer: >60
Glucose, Bld: 147 — ABNORMAL HIGH

## 2011-08-23 LAB — DIFFERENTIAL
Eosinophils Absolute: 0.2
Lymphocytes Relative: 23
Lymphs Abs: 2.2
Neutro Abs: 6.5
Neutrophils Relative %: 67

## 2011-08-23 LAB — URINALYSIS, ROUTINE W REFLEX MICROSCOPIC
Hgb urine dipstick: NEGATIVE
Protein, ur: NEGATIVE
Urobilinogen, UA: 0.2

## 2011-08-23 LAB — PROTIME-INR
INR: 1
Prothrombin Time: 13.2

## 2011-08-23 LAB — TISSUE CULTURE

## 2011-08-23 LAB — HEMOGLOBIN A1C: Hgb A1c MFr Bld: 6.9 — ABNORMAL HIGH

## 2011-08-23 LAB — GRAM STAIN

## 2011-08-23 LAB — BODY FLUID CULTURE: Culture: NO GROWTH

## 2011-08-23 LAB — URINE CULTURE

## 2011-08-23 MED ORDER — VALACYCLOVIR HCL 1 G PO TABS: 1000.0000 mg | ORAL_TABLET | Freq: Two times a day (BID) | ORAL | Status: DC

## 2011-08-23 NOTE — Telephone Encounter (Signed)
Patient is calling because she is having some stinging around her mouth.  rx sent.  Would you like for her to come in for an office visit?  She has had the shingles vaccine and history of shingles.

## 2011-08-23 NOTE — Telephone Encounter (Signed)
Patient is concerned she is starting with shingles.  Patient has questions and would like for you to give her a call.

## 2011-08-24 NOTE — Telephone Encounter (Signed)
Left message on machine for patient

## 2011-08-24 NOTE — Telephone Encounter (Signed)
Lynn [name] please call this is probably not shingles

## 2011-09-12 ENCOUNTER — Other Ambulatory Visit (INDEPENDENT_AMBULATORY_CARE_PROVIDER_SITE_OTHER): Payer: Medicare Other

## 2011-09-12 DIAGNOSIS — E119 Type 2 diabetes mellitus without complications: Secondary | ICD-10-CM

## 2011-09-12 LAB — BASIC METABOLIC PANEL
BUN: 15 mg/dL (ref 6–23)
CO2: 30 mEq/L (ref 19–32)
Chloride: 104 mEq/L (ref 96–112)
Creatinine, Ser: 0.9 mg/dL (ref 0.4–1.2)
Glucose, Bld: 121 mg/dL — ABNORMAL HIGH (ref 70–99)

## 2011-09-18 ENCOUNTER — Encounter: Payer: Self-pay | Admitting: Family Medicine

## 2011-09-18 ENCOUNTER — Other Ambulatory Visit: Payer: Self-pay | Admitting: Family Medicine

## 2011-09-19 ENCOUNTER — Encounter: Payer: Self-pay | Admitting: Family Medicine

## 2011-09-19 ENCOUNTER — Ambulatory Visit (INDEPENDENT_AMBULATORY_CARE_PROVIDER_SITE_OTHER): Payer: Medicare Other | Admitting: Family Medicine

## 2011-09-19 DIAGNOSIS — F329 Major depressive disorder, single episode, unspecified: Secondary | ICD-10-CM

## 2011-09-19 DIAGNOSIS — I10 Essential (primary) hypertension: Secondary | ICD-10-CM

## 2011-09-19 DIAGNOSIS — F3289 Other specified depressive episodes: Secondary | ICD-10-CM

## 2011-09-19 DIAGNOSIS — E119 Type 2 diabetes mellitus without complications: Secondary | ICD-10-CM

## 2011-09-19 DIAGNOSIS — E785 Hyperlipidemia, unspecified: Secondary | ICD-10-CM

## 2011-09-19 MED ORDER — ZOLPIDEM TARTRATE 5 MG PO TABS: 5.0000 mg | ORAL_TABLET | Freq: Every evening | ORAL | Status: DC | PRN

## 2011-09-19 MED ORDER — METFORMIN HCL 500 MG PO TABS: 500.0000 mg | ORAL_TABLET | Freq: Every day | ORAL | Status: DC

## 2011-09-19 MED ORDER — ATORVASTATIN CALCIUM 40 MG PO TABS: 40.0000 mg | ORAL_TABLET | Freq: Every day | ORAL | Status: DC

## 2011-09-19 MED ORDER — CITALOPRAM HYDROBROMIDE 20 MG PO TABS: 20.0000 mg | ORAL_TABLET | Freq: Every day | ORAL | Status: DC

## 2011-09-19 MED ORDER — LISINOPRIL-HYDROCHLOROTHIAZIDE 20-25 MG PO TABS: 1.0000 | ORAL_TABLET | Freq: Every day | ORAL | Status: DC

## 2011-09-19 NOTE — Progress Notes (Signed)
  Subjective:    Patient ID: Lynn [name], female    DOB: 03-03-1946, 65 y.o.   MRN: [id number]  Lynn [name] is a 65 year-old, single female, nonsmoker retired Engineer, civil (consulting), who comes in today for follow-up of diabetes, type II.  She takes metformin 500 mg daily blood sugars are in the 110 to 130 range.  A1c6 .8%.  In the past two years.  She has decreased her weight from 238 to 192 via diet and exercise.  Physical examination due in May    Review of Systems    General and metabolic review of systems otherwise negative, except she gets hypoglycemic if she takes her medication and does not E..  She only eats one meal a day and at that noon. Objective:   Physical Exam  Well-developed well-nourished, female, in no acute distress      Assessment & Plan:  Diabetes type 2, plan keep current medications, but taken before her largest meal of the day, which is at lunch.  Follow-up in May for CPX

## 2011-09-19 NOTE — Patient Instructions (Signed)
Continue your current medications except remember to take the metformin before lunch.  Return in May for your annual exam.  Fasting labs one week prior

## 2011-09-20 ENCOUNTER — Ambulatory Visit: Payer: Medicare Other | Attending: Orthopedic Surgery | Admitting: Physical Therapy

## 2011-09-20 DIAGNOSIS — M25519 Pain in unspecified shoulder: Secondary | ICD-10-CM | POA: Insufficient documentation

## 2011-09-20 DIAGNOSIS — M25619 Stiffness of unspecified shoulder, not elsewhere classified: Secondary | ICD-10-CM | POA: Insufficient documentation

## 2011-09-20 DIAGNOSIS — IMO0001 Reserved for inherently not codable concepts without codable children: Secondary | ICD-10-CM | POA: Insufficient documentation

## 2011-09-25 ENCOUNTER — Encounter: Payer: Medicare Other | Admitting: Physical Therapy

## 2011-09-26 ENCOUNTER — Ambulatory Visit: Payer: Medicare Other | Admitting: Physical Therapy

## 2011-10-02 ENCOUNTER — Ambulatory Visit: Payer: Medicare Other | Admitting: Physical Therapy

## 2011-10-09 ENCOUNTER — Ambulatory Visit: Payer: Medicare Other | Attending: Orthopedic Surgery | Admitting: Physical Therapy

## 2011-10-09 DIAGNOSIS — IMO0001 Reserved for inherently not codable concepts without codable children: Secondary | ICD-10-CM | POA: Insufficient documentation

## 2011-10-09 DIAGNOSIS — M25619 Stiffness of unspecified shoulder, not elsewhere classified: Secondary | ICD-10-CM | POA: Insufficient documentation

## 2011-10-09 DIAGNOSIS — M25519 Pain in unspecified shoulder: Secondary | ICD-10-CM | POA: Insufficient documentation

## 2011-10-12 ENCOUNTER — Ambulatory Visit: Payer: Medicare Other | Admitting: Physical Therapy

## 2011-10-16 ENCOUNTER — Encounter: Payer: Medicare Other | Admitting: Physical Therapy

## 2011-10-18 ENCOUNTER — Encounter: Payer: Medicare Other | Admitting: Physical Therapy

## 2011-10-23 ENCOUNTER — Encounter: Payer: Medicare Other | Admitting: Physical Therapy

## 2011-10-24 ENCOUNTER — Ambulatory Visit: Payer: Medicare Other | Admitting: Physical Therapy

## 2011-10-26 ENCOUNTER — Encounter: Payer: Medicare Other | Admitting: Physical Therapy

## 2011-11-09 ENCOUNTER — Other Ambulatory Visit: Payer: Self-pay | Admitting: *Deleted

## 2011-11-09 DIAGNOSIS — F3289 Other specified depressive episodes: Secondary | ICD-10-CM

## 2011-11-09 DIAGNOSIS — M25569 Pain in unspecified knee: Secondary | ICD-10-CM

## 2011-11-09 DIAGNOSIS — F329 Major depressive disorder, single episode, unspecified: Secondary | ICD-10-CM

## 2011-11-09 MED ORDER — HYDROCODONE-ACETAMINOPHEN 7.5-750 MG PO TABS: 1.0000 | ORAL_TABLET | Freq: Four times a day (QID) | ORAL | Status: DC | PRN

## 2011-11-09 MED ORDER — ZOLPIDEM TARTRATE 5 MG PO TABS: 5.0000 mg | ORAL_TABLET | Freq: Every evening | ORAL | Status: DC | PRN

## 2011-11-15 ENCOUNTER — Encounter (HOSPITAL_COMMUNITY): Payer: Self-pay | Admitting: Pharmacy Technician

## 2011-11-22 ENCOUNTER — Other Ambulatory Visit (HOSPITAL_COMMUNITY): Payer: Medicare Other

## 2011-11-23 ENCOUNTER — Encounter (HOSPITAL_COMMUNITY)
Admission: RE | Admit: 2011-11-23 | Discharge: 2011-11-23 | Disposition: A | Discharge status: Home / Self Care | Payer: Medicare Other | Source: Ambulatory Visit | Attending: Anesthesiology | Admitting: Anesthesiology

## 2011-11-23 ENCOUNTER — Encounter (HOSPITAL_COMMUNITY)
Admission: RE | Admit: 2011-11-23 | Discharge: 2011-11-23 | Disposition: A | Discharge status: Home / Self Care | Payer: Medicare Other | Source: Ambulatory Visit | Attending: Orthopedic Surgery | Admitting: Orthopedic Surgery

## 2011-11-23 ENCOUNTER — Encounter (HOSPITAL_COMMUNITY): Payer: Self-pay

## 2011-11-23 HISTORY — DX: Disease of blood and blood-forming organs, unspecified: D75.9

## 2011-11-23 HISTORY — DX: Other disorders of lung: J98.4

## 2011-11-23 LAB — CBC
HCT: 44 % (ref 36.0–46.0)
Hemoglobin: 14.8 g/dL (ref 12.0–15.0)
MCH: 30.6 pg (ref 26.0–34.0)
MCHC: 33.6 g/dL (ref 30.0–36.0)
MCV: 90.9 fL (ref 78.0–100.0)

## 2011-11-23 LAB — BASIC METABOLIC PANEL
BUN: 18 mg/dL (ref 6–23)
Chloride: 100 mEq/L (ref 96–112)
GFR calc non Af Amer: 87 mL/min — ABNORMAL LOW (ref >90)
Glucose, Bld: 102 mg/dL — ABNORMAL HIGH (ref 70–99)
Potassium: 4.2 mEq/L (ref 3.5–5.1)

## 2011-11-23 NOTE — Pre-Procedure Instructions (Signed)
20 Lynn [name] [facility]  11/23/2011   Your procedure is scheduled on:   Thursday 11/30/11     Report to [facility] at 1100 AM.  Call this number if you have problems the morning of surgery: [phone]   Remember:   Do not eat food:After Midnight.  May have clear liquids: up to 4 Hours before arrival.  Clear liquids include soda, tea, black coffee, apple or grape juice, broth.  Take these medicines the morning of surgery with A SIP OF WATER:  CELEXA, VICODIN,   Do not wear jewelry, make-up or nail polish.  Do not wear lotions, powders, or perfumes. You may wear deodorant.  Do not shave 48 hours prior to surgery.  Do not bring valuables to the hospital.  Contacts, dentures or bridgework may not be worn into surgery.  Leave suitcase in the car. After surgery it may be brought to your room.  For patients admitted to the hospital, checkout time is 11:00 AM the day of discharge.   Patients discharged the day of surgery will not be allowed to drive home.  Name and phone number of your driver:   Special Instructions: CHG Shower Use Special Wash: 1/2 bottle night before surgery and 1/2 bottle morning of surgery.   Please read over the following fact sheets that you were given: Pain Booklet, MRSA Information and Surgical Site Infection Prevention

## 2011-11-24 ENCOUNTER — Inpatient Hospital Stay (HOSPITAL_COMMUNITY): Admission: RE | Admit: 2011-11-24 | Payer: Medicare Other | Source: Ambulatory Visit

## 2011-11-24 NOTE — Progress Notes (Signed)
CALLED DR GRAMIG'S OFFICE.left.M. ON VOICE MAIL FOR KAREN (SCHEDULER) REQUESTED ORDERS FOR Lynn [name] WHO IS SCHEDULED FOR SURGERY 11/30/2011

## 2011-11-27 NOTE — Progress Notes (Signed)
REREQUESTED SURGICAL ORDERS FROM DR GRAMIG'S OFFICE, MESSAGE LEFT WITH SHERRY.

## 2011-11-28 NOTE — Consult Note (Signed)
Anesthesia:  Patient is a 66 year old female scheduled for a left shoulder arthroscopy, rotator cuff repair on 11/30/11 by Dr. Amanda Pea.  History includes DM2, HLD, HTN, asthma, depression, lung nodules followed by Dr. 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  Her PCP is Dr. Kelle Darting with [facility].  Patient is a retired Engineer, civil (consulting).  She has a history of an infected right TKA that required a staged revision with two procedures in 2008.  I do not see that any Cardiac studies have been done.    CXR from 11/23/11 showed no change in a left lower lobe pulmonary nodule. No acute abnormality.  Labs reviewed.    EKG on 03/16/11 shows SR, possible anterior and inferior infarct.  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  No CV symptoms were reported at PAT.  I reviewed her EKG and history with Anesthesiologist Dr. Noreene Larsson.  Clinical correlation on the day of surgery, but if remains asymptomatic then anticipate that she can proceed.

## 2011-11-29 ENCOUNTER — Other Ambulatory Visit: Payer: Self-pay | Admitting: Orthopedic Surgery

## 2011-11-29 MED ORDER — CEFAZOLIN SODIUM-DEXTROSE 2-3 GM-% IV SOLR
2.0000 g | INTRAVENOUS | Status: AC
  Administered 2011-11-30: 2 g via INTRAVENOUS
  Filled 2011-11-29: qty 50

## 2011-11-30 ENCOUNTER — Encounter (HOSPITAL_COMMUNITY)
Admission: RE | Disposition: A | Discharge status: Home / Self Care | Payer: Self-pay | Source: Ambulatory Visit | Attending: Orthopedic Surgery

## 2011-11-30 ENCOUNTER — Encounter (HOSPITAL_COMMUNITY): Payer: Self-pay | Admitting: Vascular Surgery

## 2011-11-30 ENCOUNTER — Encounter (HOSPITAL_COMMUNITY): Payer: Self-pay

## 2011-11-30 ENCOUNTER — Ambulatory Visit (HOSPITAL_COMMUNITY): Payer: Medicare Other | Admitting: Vascular Surgery

## 2011-11-30 ENCOUNTER — Encounter (HOSPITAL_COMMUNITY): Payer: Self-pay | Admitting: *Deleted

## 2011-11-30 ENCOUNTER — Ambulatory Visit (HOSPITAL_COMMUNITY)
Admission: RE | Admit: 2011-11-30 | Discharge: 2011-12-03 | Disposition: A | Discharge status: Home / Self Care | Payer: Medicare Other | Source: Ambulatory Visit | Attending: Orthopedic Surgery | Admitting: Orthopedic Surgery

## 2011-11-30 DIAGNOSIS — Z87891 Personal history of nicotine dependence: Secondary | ICD-10-CM | POA: Insufficient documentation

## 2011-11-30 DIAGNOSIS — Z01812 Encounter for preprocedural laboratory examination: Secondary | ICD-10-CM | POA: Insufficient documentation

## 2011-11-30 DIAGNOSIS — E119 Type 2 diabetes mellitus without complications: Secondary | ICD-10-CM | POA: Insufficient documentation

## 2011-11-30 DIAGNOSIS — I10 Essential (primary) hypertension: Secondary | ICD-10-CM

## 2011-11-30 DIAGNOSIS — M67919 Unspecified disorder of synovium and tendon, unspecified shoulder: Secondary | ICD-10-CM | POA: Insufficient documentation

## 2011-11-30 DIAGNOSIS — M719 Bursopathy, unspecified: Secondary | ICD-10-CM | POA: Insufficient documentation

## 2011-11-30 DIAGNOSIS — M19019 Primary osteoarthritis, unspecified shoulder: Secondary | ICD-10-CM | POA: Insufficient documentation

## 2011-11-30 DIAGNOSIS — J449 Chronic obstructive pulmonary disease, unspecified: Secondary | ICD-10-CM | POA: Insufficient documentation

## 2011-11-30 DIAGNOSIS — E785 Hyperlipidemia, unspecified: Secondary | ICD-10-CM

## 2011-11-30 DIAGNOSIS — F3289 Other specified depressive episodes: Secondary | ICD-10-CM

## 2011-11-30 DIAGNOSIS — F329 Major depressive disorder, single episode, unspecified: Secondary | ICD-10-CM

## 2011-11-30 DIAGNOSIS — J4489 Other specified chronic obstructive pulmonary disease: Secondary | ICD-10-CM | POA: Insufficient documentation

## 2011-11-30 DIAGNOSIS — Z01818 Encounter for other preprocedural examination: Secondary | ICD-10-CM | POA: Insufficient documentation

## 2011-11-30 DIAGNOSIS — M25819 Other specified joint disorders, unspecified shoulder: Secondary | ICD-10-CM | POA: Insufficient documentation

## 2011-11-30 LAB — GLUCOSE, CAPILLARY
Glucose-Capillary: 142 mg/dL — ABNORMAL HIGH (ref 70–99)
Glucose-Capillary: 131 mg/dL — ABNORMAL HIGH (ref 70–99)
Glucose-Capillary: 157 mg/dL — ABNORMAL HIGH (ref 70–99)

## 2011-11-30 SURGERY — SHOULDER ARTHROSCOPY WITH ROTATOR CUFF REPAIR AND SUBACROMIAL DECOMPRESSION
Anesthesia: General | Site: Shoulder | Laterality: Left | Wound class: Clean

## 2011-11-30 MED ORDER — ARTIFICIAL TEARS OP OINT
TOPICAL_OINTMENT | OPHTHALMIC | Status: DC | PRN
  Administered 2011-11-30: 1 via OPHTHALMIC

## 2011-11-30 MED ORDER — ROSUVASTATIN CALCIUM 20 MG PO TABS
20.0000 mg | ORAL_TABLET | Freq: Every day | ORAL | Status: DC
  Administered 2011-11-30 – 2011-12-02 (×3): 20 mg via ORAL
  Filled 2011-11-30 (×4): qty 1

## 2011-11-30 MED ORDER — HYDROXYZINE HCL 25 MG PO TABS
25.0000 mg | ORAL_TABLET | Freq: Three times a day (TID) | ORAL | Status: DC | PRN
  Filled 2011-11-30: qty 1

## 2011-11-30 MED ORDER — LACTATED RINGERS IV SOLN
INTRAVENOUS | Status: DC | PRN
  Administered 2011-11-30 (×2): via INTRAVENOUS

## 2011-11-30 MED ORDER — ONDANSETRON HCL 4 MG/2ML IJ SOLN: 4.0000 mg | Freq: Once | INTRAMUSCULAR | Status: DC | PRN

## 2011-11-30 MED ORDER — OXYCODONE HCL 5 MG PO TABS
5.0000 mg | ORAL_TABLET | ORAL | Status: DC | PRN
  Administered 2011-11-30: 5 mg via ORAL
  Administered 2011-12-01 – 2011-12-02 (×7): 10 mg via ORAL
  Administered 2011-12-03 (×2): 5 mg via ORAL
  Filled 2011-11-30 (×10): qty 2
  Filled 2011-11-30 (×2): qty 1

## 2011-11-30 MED ORDER — FENTANYL CITRATE 0.05 MG/ML IJ SOLN
INTRAMUSCULAR | Status: AC
  Filled 2011-11-30: qty 2

## 2011-11-30 MED ORDER — HYDROMORPHONE HCL PF 1 MG/ML IJ SOLN: 0.2500 mg | INTRAMUSCULAR | Status: DC | PRN

## 2011-11-30 MED ORDER — ADULT MULTIVITAMIN W/MINERALS CH
1.0000 | ORAL_TABLET | Freq: Every day | ORAL | Status: DC
  Administered 2011-11-30 – 2011-12-02 (×3): 1 via ORAL
  Filled 2011-11-30 (×4): qty 1

## 2011-11-30 MED ORDER — ONDANSETRON HCL 4 MG PO TABS: 4.0000 mg | ORAL_TABLET | Freq: Four times a day (QID) | ORAL | Status: DC | PRN

## 2011-11-30 MED ORDER — NEOSTIGMINE METHYLSULFATE 1 MG/ML IJ SOLN
INTRAMUSCULAR | Status: DC | PRN
  Administered 2011-11-30: 2 mg via INTRAVENOUS

## 2011-11-30 MED ORDER — PHENYLEPHRINE HCL 10 MG/ML IJ SOLN
10.0000 mg | INTRAVENOUS | Status: DC | PRN
  Administered 2011-11-30: 10 ug/min via INTRAVENOUS

## 2011-11-30 MED ORDER — SENNA 8.6 MG PO TABS
1.0000 | ORAL_TABLET | Freq: Two times a day (BID) | ORAL | Status: DC
  Administered 2011-11-30 – 2011-12-02 (×5): 8.6 mg via ORAL
  Filled 2011-11-30 (×8): qty 1

## 2011-11-30 MED ORDER — LACTATED RINGERS IV SOLN
INTRAVENOUS | Status: DC
  Administered 2011-11-30: via INTRAVENOUS

## 2011-11-30 MED ORDER — CEFAZOLIN SODIUM 1-5 GM-% IV SOLN
1.0000 g | Freq: Three times a day (TID) | INTRAVENOUS | Status: DC
  Administered 2011-11-30 – 2011-12-03 (×8): 1 g via INTRAVENOUS
  Filled 2011-11-30 (×10): qty 50

## 2011-11-30 MED ORDER — 0.9 % SODIUM CHLORIDE (POUR BTL) OPTIME
TOPICAL | Status: DC | PRN
  Administered 2011-11-30: 1000 mL

## 2011-11-30 MED ORDER — LACTATED RINGERS IV SOLN
INTRAVENOUS | Status: DC
  Administered 2011-11-30: 13:00:00 via INTRAVENOUS

## 2011-11-30 MED ORDER — PHENYLEPHRINE HCL 10 MG/ML IJ SOLN
INTRAMUSCULAR | Status: DC | PRN
  Administered 2011-11-30 (×4): 80 ug via INTRAVENOUS
  Administered 2011-11-30 (×2): 40 ug via INTRAVENOUS

## 2011-11-30 MED ORDER — ASPIRIN 81 MG PO TABS: 81.0000 mg | ORAL_TABLET | Freq: Every day | ORAL | Status: DC

## 2011-11-30 MED ORDER — METHOCARBAMOL 500 MG PO TABS
500.0000 mg | ORAL_TABLET | Freq: Four times a day (QID) | ORAL | Status: DC | PRN
  Administered 2011-12-01 – 2011-12-03 (×5): 500 mg via ORAL
  Filled 2011-11-30 (×5): qty 1

## 2011-11-30 MED ORDER — HYDROMORPHONE HCL PF 1 MG/ML IJ SOLN
0.5000 mg | INTRAMUSCULAR | Status: DC | PRN
  Administered 2011-11-30 – 2011-12-01 (×4): 1 mg via INTRAVENOUS
  Administered 2011-12-01: 0.5 mg via INTRAVENOUS
  Administered 2011-12-01 – 2011-12-02 (×4): 1 mg via INTRAVENOUS
  Filled 2011-11-30 (×9): qty 1

## 2011-11-30 MED ORDER — CITALOPRAM HYDROBROMIDE 20 MG PO TABS
20.0000 mg | ORAL_TABLET | Freq: Every day | ORAL | Status: DC
  Administered 2011-11-30 – 2011-12-02 (×3): 20 mg via ORAL
  Filled 2011-11-30 (×4): qty 1

## 2011-11-30 MED ORDER — ONDANSETRON HCL 4 MG/2ML IJ SOLN
INTRAMUSCULAR | Status: DC | PRN
  Administered 2011-11-30: 4 mg via INTRAVENOUS

## 2011-11-30 MED ORDER — ASPIRIN EC 81 MG PO TBEC
81.0000 mg | DELAYED_RELEASE_TABLET | Freq: Every day | ORAL | Status: DC
  Administered 2011-11-30 – 2011-12-02 (×3): 81 mg via ORAL
  Filled 2011-11-30 (×4): qty 1

## 2011-11-30 MED ORDER — ALPRAZOLAM 0.5 MG PO TABS
0.5000 mg | ORAL_TABLET | Freq: Four times a day (QID) | ORAL | Status: DC | PRN
  Administered 2011-12-02: 0.5 mg via ORAL
  Filled 2011-11-30 (×2): qty 1

## 2011-11-30 MED ORDER — LISINOPRIL-HYDROCHLOROTHIAZIDE 20-25 MG PO TABS: 1.0000 | ORAL_TABLET | Freq: Every day | ORAL | Status: DC

## 2011-11-30 MED ORDER — FENTANYL CITRATE 0.05 MG/ML IJ SOLN: INTRAMUSCULAR | Status: DC | PRN

## 2011-11-30 MED ORDER — PROPOFOL 10 MG/ML IV EMUL
INTRAVENOUS | Status: DC | PRN
  Administered 2011-11-30: 70 mg via INTRAVENOUS
  Administered 2011-11-30: 130 mg via INTRAVENOUS

## 2011-11-30 MED ORDER — ONDANSETRON HCL 4 MG/2ML IJ SOLN: 4.0000 mg | Freq: Four times a day (QID) | INTRAMUSCULAR | Status: DC | PRN

## 2011-11-30 MED ORDER — FENTANYL CITRATE 0.05 MG/ML IJ SOLN
50.0000 ug | INTRAMUSCULAR | Status: DC | PRN
  Administered 2011-11-30: 100 ug via INTRAVENOUS

## 2011-11-30 MED ORDER — DEXTROSE 5 % IV SOLN
500.0000 mg | Freq: Four times a day (QID) | INTRAVENOUS | Status: DC | PRN
  Filled 2011-11-30: qty 5

## 2011-11-30 MED ORDER — LIDOCAINE HCL 4 % MT SOLN
OROMUCOSAL | Status: DC | PRN
  Administered 2011-11-30: 4 mL via TOPICAL

## 2011-11-30 MED ORDER — CHLORHEXIDINE GLUCONATE 4 % EX LIQD: 60.0000 mL | Freq: Once | CUTANEOUS | Status: DC

## 2011-11-30 MED ORDER — CEFAZOLIN SODIUM 1-5 GM-% IV SOLN: 1.0000 g | Freq: Three times a day (TID) | INTRAVENOUS | Status: DC

## 2011-11-30 MED ORDER — LIDOCAINE HCL (CARDIAC) 20 MG/ML IV SOLN
INTRAVENOUS | Status: DC | PRN
  Administered 2011-11-30: 40 mg via INTRAVENOUS

## 2011-11-30 MED ORDER — MIDAZOLAM HCL 5 MG/5ML IJ SOLN
INTRAMUSCULAR | Status: DC | PRN
  Administered 2011-11-30 (×2): 2 mg via INTRAVENOUS

## 2011-11-30 MED ORDER — GLYCOPYRROLATE 0.2 MG/ML IJ SOLN
INTRAMUSCULAR | Status: DC | PRN
  Administered 2011-11-30 (×2): 0.2 mg via INTRAVENOUS

## 2011-11-30 MED ORDER — SODIUM CHLORIDE 0.9 % IR SOLN
Status: DC | PRN
  Administered 2011-11-30: 6000 mL

## 2011-11-30 MED ORDER — LISINOPRIL 20 MG PO TABS
20.0000 mg | ORAL_TABLET | Freq: Every day | ORAL | Status: DC
  Administered 2011-12-01: 20 mg via ORAL
  Filled 2011-11-30 (×4): qty 1

## 2011-11-30 MED ORDER — HYDROCHLOROTHIAZIDE 25 MG PO TABS
25.0000 mg | ORAL_TABLET | Freq: Every day | ORAL | Status: DC
  Administered 2011-12-01 – 2011-12-02 (×2): 25 mg via ORAL
  Filled 2011-11-30 (×4): qty 1

## 2011-11-30 MED ORDER — ROCURONIUM BROMIDE 100 MG/10ML IV SOLN
INTRAVENOUS | Status: DC | PRN
  Administered 2011-11-30: 50 mg via INTRAVENOUS

## 2011-11-30 MED ORDER — METFORMIN HCL 500 MG PO TABS
500.0000 mg | ORAL_TABLET | Freq: Every day | ORAL | Status: DC
  Administered 2011-12-01 – 2011-12-03 (×3): 500 mg via ORAL
  Filled 2011-11-30 (×5): qty 1

## 2011-11-30 MED ORDER — FAMOTIDINE 20 MG PO TABS
20.0000 mg | ORAL_TABLET | Freq: Two times a day (BID) | ORAL | Status: DC | PRN
  Administered 2011-12-02: 20 mg via ORAL
  Filled 2011-11-30: qty 1

## 2011-11-30 MED ORDER — EPHEDRINE SULFATE 50 MG/ML IJ SOLN
INTRAMUSCULAR | Status: DC | PRN
  Administered 2011-11-30: 15 mg via INTRAVENOUS
  Administered 2011-11-30: 10 mg via INTRAVENOUS

## 2011-11-30 SURGICAL SUPPLY — 99 items
ADH SKN CLS APL DERMABOND .7 (GAUZE/BANDAGES/DRESSINGS) ×1
ANCH SUT 2 2.9 2 LD BLU (Anchor) ×1 IMPLANT
ANCH SUT PUSHLCK 24X4.5 STRL (Orthopedic Implant) ×2 IMPLANT
ANCHOR JUGGERKNOT 2.9 (Anchor) ×3 IMPLANT
APL SKNCLS STERI-STRIP NONHPOA (GAUZE/BANDAGES/DRESSINGS) ×1
BENZOIN TINCTURE PRP APPL 2/3 (GAUZE/BANDAGES/DRESSINGS) ×1 IMPLANT
BLADE AVERAGE 25X9 (BLADE) ×2 IMPLANT
BLADE CUDA 5.5 (BLADE) IMPLANT
BLADE CUTTER GATOR 3.5 (BLADE) IMPLANT
BLADE GREAT WHITE 4.2 (BLADE) ×1 IMPLANT
BLADE SURG 15 STRL LF DISP TIS (BLADE) IMPLANT
BLADE SURG 15 STRL SS (BLADE) ×4
BLADE SURG ROTATE 9660 (MISCELLANEOUS) IMPLANT
BUR 3.5 LG SPHERICAL (BURR) IMPLANT
BUR OVAL 4.0 (BURR) ×2 IMPLANT
BUR OVAL 6.0 (BURR) IMPLANT
BURR 3.5 LG SPHERICAL (BURR)
CANISTER OMNI JUG 16 LITER (MISCELLANEOUS) ×2 IMPLANT
CANISTER SUCTION 2500CC (MISCELLANEOUS) IMPLANT
CANNULA 5.75X71 LONG (CANNULA) ×2 IMPLANT
CANNULA TWIST IN 8.25X7CM (CANNULA) ×1 IMPLANT
CHLORAPREP W/TINT 26ML (MISCELLANEOUS) ×2 IMPLANT
CLOTH BEACON ORANGE TIMEOUT ST (SAFETY) ×2 IMPLANT
DECANTER SPIKE VIAL GLASS SM (MISCELLANEOUS) IMPLANT
DERMABOND ADVANCED (GAUZE/BANDAGES/DRESSINGS) ×1
DERMABOND ADVANCED .7 DNX12 (GAUZE/BANDAGES/DRESSINGS) IMPLANT
DRAPE INCISE IOBAN 66X45 STRL (DRAPES) ×4 IMPLANT
DRAPE STERI 35X30 U-POUCH (DRAPES) ×4 IMPLANT
DRAPE SURG 17X23 STRL (DRAPES) ×2 IMPLANT
DRAPE U-SHAPE 47X51 STRL (DRAPES) ×2 IMPLANT
DRAPE U-SHAPE 76X120 STRL (DRAPES) ×4 IMPLANT
DRSG MEPILEX BORDER 4X4 (GAUZE/BANDAGES/DRESSINGS) ×1 IMPLANT
DRSG MEPILEX BORDER 4X8 (GAUZE/BANDAGES/DRESSINGS) ×1 IMPLANT
DRSG PAD ABDOMINAL 8X10 ST (GAUZE/BANDAGES/DRESSINGS) ×2 IMPLANT
ELECT REM PT RETURN 9FT ADLT (ELECTROSURGICAL) ×2
ELECTRODE REM PT RTRN 9FT ADLT (ELECTROSURGICAL) ×1 IMPLANT
GAUZE SPONGE 4X4 16PLY XRAY LF (GAUZE/BANDAGES/DRESSINGS) ×1 IMPLANT
GAUZE XEROFORM 1X8 LF (GAUZE/BANDAGES/DRESSINGS) ×2 IMPLANT
GLOVE BIO SURGEON STRL SZ7.5 (GLOVE) ×4 IMPLANT
GLOVE BIOGEL PI IND STRL 6.5 (GLOVE) IMPLANT
GLOVE BIOGEL PI IND STRL 8 (GLOVE) ×2 IMPLANT
GLOVE BIOGEL PI INDICATOR 6.5 (GLOVE) ×1
GLOVE BIOGEL PI INDICATOR 8 (GLOVE) ×2
GLOVE ORTHOPEDIC STR SZ6.5 (GLOVE) ×1 IMPLANT
GLOVE SS BIOGEL STRL SZ 6.5 (GLOVE) IMPLANT
GLOVE SUPERSENSE BIOGEL SZ 6.5 (GLOVE) ×1
GOWN PREVENTION PLUS XLARGE (GOWN DISPOSABLE) ×2 IMPLANT
GOWN STRL NON-REIN LRG LVL3 (GOWN DISPOSABLE) ×2 IMPLANT
KIT BASIN OR (CUSTOM PROCEDURE TRAY) ×2 IMPLANT
KIT JUGGERKNOT DISP 2.9MM (KITS) ×1 IMPLANT
NDL 1/2 CIR CATGUT .05X1.09 (NEEDLE) IMPLANT
NDL SCORPION MULTI FIRE (NEEDLE) IMPLANT
NDL SUT 6 .5 CRC .975X.05 MAYO (NEEDLE) IMPLANT
NEEDLE 1/2 CIR CATGUT .05X1.09 (NEEDLE) IMPLANT
NEEDLE MAYO TAPER (NEEDLE)
NEEDLE SCORPION MULTI FIRE (NEEDLE) ×2 IMPLANT
NS IRRIG 1000ML POUR BTL (IV SOLUTION) ×1 IMPLANT
PACK ARTHROSCOPY DSU (CUSTOM PROCEDURE TRAY) ×2 IMPLANT
PENCIL BUTTON HOLSTER BLD 10FT (ELECTRODE) ×1 IMPLANT
PUSHLOCK PEEK 4.5X24 (Orthopedic Implant) ×2 IMPLANT
RESECTOR FULL RADIUS 4.2MM (BLADE) ×2 IMPLANT
SLING ARM FOAM STRAP LRG (SOFTGOODS) IMPLANT
SLING ARM FOAM STRAP MED (SOFTGOODS) ×2 IMPLANT
SLING ARM FOAM STRAP XLG (SOFTGOODS) IMPLANT
SLING ARM IMMOBILIZER MED (SOFTGOODS) IMPLANT
SPONGE GAUZE 4X4 12PLY (GAUZE/BANDAGES/DRESSINGS) ×2 IMPLANT
SPONGE LAP 18X18 X RAY DECT (DISPOSABLE) ×1 IMPLANT
SPONGE LAP 4X18 X RAY DECT (DISPOSABLE) IMPLANT
STRIP CLOSURE SKIN 1/2X4 (GAUZE/BANDAGES/DRESSINGS) IMPLANT
SUCTION FRAZIER TIP 10 FR DISP (SUCTIONS) ×1 IMPLANT
SUPPORT WRAP ARM LG (MISCELLANEOUS) IMPLANT
SUT BONE WAX W31G (SUTURE) ×1 IMPLANT
SUT ETHIBOND 2 OS 4 DA (SUTURE) IMPLANT
SUT ETHILON 3 0 PS 1 (SUTURE) ×2 IMPLANT
SUT ETHILON 4 0 PS 2 18 (SUTURE) IMPLANT
SUT FIBERWIRE #2 38 T-5 BLUE (SUTURE)
SUT FIBERWIRE 2-0 18 17.9 3/8 (SUTURE)
SUT MNCRL AB 3-0 PS2 18 (SUTURE) IMPLANT
SUT MNCRL AB 4-0 PS2 18 (SUTURE) IMPLANT
SUT PDS AB 0 CT 36 (SUTURE) IMPLANT
SUT PROLENE 3 0 PS 2 (SUTURE) ×2 IMPLANT
SUT PROLENE 4 0 PS 2 18 (SUTURE) ×2 IMPLANT
SUT VIC AB 0 CT1 18XCR BRD 8 (SUTURE) IMPLANT
SUT VIC AB 0 CT1 27 (SUTURE) ×4
SUT VIC AB 0 CT1 27XBRD ANBCTR (SUTURE) IMPLANT
SUT VIC AB 0 CT1 8-18 (SUTURE)
SUT VIC AB 2-0 SH 18 (SUTURE) IMPLANT
SUT VIC AB 3-0 X1 27 (SUTURE) ×4 IMPLANT
SUT VICRYL 4-0 PS2 18IN ABS (SUTURE) ×1 IMPLANT
SUTURE FIBERWR #2 38 T-5 BLUE (SUTURE) IMPLANT
SUTURE FIBERWR 2-0 18 17.9 3/8 (SUTURE) IMPLANT
SYR BULB 3OZ (MISCELLANEOUS) ×1 IMPLANT
TOWEL OR 17X24 6PK STRL BLUE (TOWEL DISPOSABLE) ×2 IMPLANT
TOWEL OR NON WOVEN STRL DISP B (DISPOSABLE) ×2 IMPLANT
TUBE CONNECTING 20X1/4 (TUBING) ×3 IMPLANT
TUBING ARTHROSCOPY IRRIG 16FT (MISCELLANEOUS) ×3 IMPLANT
WAND 90 DEG TURBOVAC W/CORD (SURGICAL WAND) ×3 IMPLANT
WATER STERILE IRR 1000ML POUR (IV SOLUTION) ×2 IMPLANT
YANKAUER SUCT BULB TIP NO VENT (SUCTIONS) ×2 IMPLANT

## 2011-11-30 NOTE — H&P (Signed)
Lynn [name] is an 66 y.o. female.   Chief Complaint: left shoulder pain and RCT HPI: Patient presents for left shoulder Rotator cuff repair and SAD DCR and repair as necessary.  Past Medical History  Diagnosis Date  . DIABETES MELLITUS, TYPE II 03/14/2007  . HYPERLIPIDEMIA 06/21/2010  . DEGENERATION, MACULAR NOS 03/14/2007  . Unspecified hearing loss 01/13/2010  . HYPERTENSION 03/14/2007  . DIVERTICULOSIS OF COLON 01/11/2009  . DISC DISEASE, LUMBAR 05/25/2009  . Diarrhea 05/31/2007  . 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 VERTIGO 11/10/2010  . SEBORRHEIC KERATOSIS, INFLAMED 12/02/2010  . ASTHMA 03/14/2007    NO PROBELEM IN 12 YRS  . DEPRESSION 03/14/2007  . No pertinent past medical history     MENEIRS SYNDROME   . Lung abnormality     LT LUNG WITH SPOT HAS BEEN FOLLOWED X 3 YRS DR Edwyna Shell  . Blood dyscrasia     VON [place]DE "FREE BLEEDER"    Past Surgical History  Procedure Date  . Cholecystectomy   . Tonsillectomy   . Spine surgery     fusion, ruptured disk  . Knee arthroscopy     right, TNR  . Joint replacement     1998 RT KNEE  +2009 REMOVED, REPLACED JOINT    Family History  Problem Relation Age of Onset  . Heart disease Other   . Diabetes Other     diabeties  . Hyperlipidemia Other   . Hypertension Other    Social History:  reports that she quit smoking about 12 years ago. Her smoking use included Cigarettes. She has a 74 pack-year smoking history. She does not have any smokeless tobacco history on file. She reports that she drinks alcohol. She reports that she does not use illicit drugs.  Allergies:  Allergies  Allergen Reactions  . Morphine And Related Itching    Medications Prior to Admission  Medication Dose Route Frequency Provider Last Rate Last Dose  . ceFAZolin (ANCEF) IVPB 2 g/50 mL premix  2 g Intravenous 60 min Pre-Op Sheran Lawless, PA      . chlorhexidine (HIBICLENS) 4 % liquid 4 application  60 mL Topical Once Sheran Lawless, PA        . fentaNYL (SUBLIMAZE) injection 50 mcg  50 mcg Intravenous Q1H PRN Rivka Barbara, MD   100 mcg at 11/30/11 1242  . lactated ringers infusion   Intravenous Continuous Rivka Barbara, MD 50 mL/hr at 11/30/11 1231     Medications Prior to Admission  Medication Sig Dispense Refill  . aspirin 81 MG tablet Take 81 mg by mouth daily.        Marland Kitchen atorvastatin (LIPITOR) 40 MG tablet Take 1 tablet (40 mg total) by mouth daily.  100 tablet  3  . citalopram (CELEXA) 20 MG tablet Take 1 tablet (20 mg total) by mouth daily.  100 tablet  3  . lisinopril-hydrochlorothiazide (PRINZIDE,ZESTORETIC) 20-25 MG per tablet Take 1 tablet by mouth daily.  100 tablet  3  . metFORMIN (GLUCOPHAGE) 500 MG tablet Take 1 tablet (500 mg total) by mouth daily with breakfast.  100 tablet  3  . Multiple Vitamins-Minerals (OCUVITE PO) Take 1 tablet by mouth daily.       Marland Kitchen glucose blood (ONE TOUCH ULTRA TEST) test strip 1 each by Other route 4 (four) times daily. Use as instructed  100 each  3    Results for orders placed during the hospital encounter of 11/30/11 (from the past 48  hour(s))  GLUCOSE, CAPILLARY     Status: Abnormal   Collection Time   11/30/11 11:01 AM      Component Value Range Comment   Glucose-Capillary 142 (*) 70 - 99 (mg/dL)    No results found.  ROS  Blood pressure 94/38, pulse 79, temperature 98.4 F (36.9 C), temperature source Oral, resp. rate 18, SpO2 100.00%. Physical Exam ..The patient is alert and oriented in no acute distress the patient complains of pain in the affected upper extremity. The patient is noted to have a normal HEENT exam. Lung fields show equal chest expansion and no shortness of breath abdomen exam is nontender without distention. Lower extremity examination does not show any fracture dislocation or blood clot symptoms. Pelvis is stable neck and back are stable and nontender Left shoulder has a RCT and impingement with pain and dysfunction Assessment/Plan .Marland KitchenWe are  planning surgery for your upper extremity. The risk and benefits of surgery include risk of bleeding infection anesthesia damage to normal structures and failure of the surgery to accomplish its intended goals of relieving symptoms and restoring function with this in mind we'll going to proceed. I have specifically discussed with the patient the pre-and postoperative regime and the does and don'ts and risk and benefits in great detail. Risk and benefits of surgery also include risk of dystrophy chronic nerve pain failure of the healing process to go onto completion and other inherent risks of surgery The relavent the pathophysiology of the disease/injury process, as well as the alternatives for treatment and postoperative course of action has been discussed in great detail with the patient who desires to proceed.  We will do everything in our power to help you (the patient) restore function to the upper extremity. Is a pleasure to see this patient today.  Plan rotator cuff left reconstruction and repair as necessary  Lynn [name],Lynn [name] 11/30/2011, 1:35 PM

## 2011-11-30 NOTE — Anesthesia Preprocedure Evaluation (Signed)
Anesthesia Evaluation  Patient identified by MRN, date of birth, ID band  Reviewed: Allergy & Precautions, H&P , NPO status , Patient's Chart, lab work & pertinent test results  History of Anesthesia Complications (+) AWARENESS UNDER ANESTHESIA  Airway Mallampati: II TM Distance: >3 FB Neck ROM: full    Dental  (+) Caps and Teeth Intact   Pulmonary asthma , COPDformer smoker         Cardiovascular hypertension, regular Normal    Neuro/Psych PSYCHIATRIC DISORDERS Negative Neurological ROS     GI/Hepatic negative GI ROS, Neg liver ROS,   Endo/Other  Diabetes mellitus-, Well Controlled, Type 2, Oral Hypoglycemic Agents  Renal/GU negative Renal ROS  Genitourinary negative   Musculoskeletal   Abdominal   Peds  Hematology negative hematology ROS (+)   Anesthesia Other Findings   Reproductive/Obstetrics                           Anesthesia Physical Anesthesia Plan  ASA: III  Anesthesia Plan: General ETT   Post-op Pain Management:    Induction: Intravenous  Airway Management Planned: Oral ETT  Additional Equipment:   Intra-op Plan:   Post-operative Plan:   Informed Consent: I have reviewed the patients History and Physical, chart, labs and discussed the procedure including the risks, benefits and alternatives for the proposed anesthesia with the patient or authorized representative who has indicated his/her understanding and acceptance.     Plan Discussed with: Anesthesiologist, CRNA and Surgeon  Anesthesia Plan Comments:         Anesthesia Quick Evaluation

## 2011-11-30 NOTE — Progress Notes (Signed)
Orthopedic Tech Progress Note Patient Details:  Giordana Weinheimer [facility] [birth year] [id number]  Other Ortho Devices Type of Ortho Device: Other (comment) (sling immobilizer) Ortho Device Location: left arm Ortho Device Interventions: Ordered Product is don joy; viewed order from rn order list  Nikki Dom 11/30/2011, 4:27 PM

## 2011-11-30 NOTE — Anesthesia Postprocedure Evaluation (Signed)
  Anesthesia Post-op Note  Patient: Lynn [name]  Procedure(s) Performed:  SHOULDER ARTHROSCOPY WITH ROTATOR CUFF REPAIR AND SUBACROMIAL DECOMPRESSION - Left Shoulder Arthroscopy Sub Acrominal Decompression Mini Open Distal Clavicle resection Rotator Cuff Repair, Biceps Tenotomy   Patient Location: PACU  Anesthesia Type: General and GA combined with regional for post-op pain  Level of Consciousness: awake and alert   Airway and Oxygen Therapy: Patient Spontanous Breathing  Post-op Pain: mild  Post-op Assessment: Post-op Vital signs reviewed, Patient's Cardiovascular Status Stable, Respiratory Function Stable, Patent Airway, No signs of Nausea or vomiting and Pain level controlled  Post-op Vital Signs: stable  Complications: No apparent anesthesia complications

## 2011-11-30 NOTE — Brief Op Note (Signed)
11/30/2011  4:23 PM  PATIENT:  Lynn [name]  66 y.o. female  PRE-OPERATIVE DIAGNOSIS: Left shoulder rotator cuff tear with early degenerative features in the glenohumeral joint. A.C. joint arthropathy, impingement syndrome, biceps degeneration  POST-OPERATIVE DIAGNOSIS: Same  PROCEDURE:  Procedure(s): Left shoulder evaluation under anesthesia  Left shoulder arthroscopy with glenohumeral and labral debridement  Orthoscopic biceps tenotomy  Distal clavicle resection open  Arthroscopic subacromial decompression and bursectomy  Mini open rotator cuff repair involving the infraspinatus and supraspinatous tendons  SURGEON:  Surgeon(s): Karen Chafe, MD  PHYSICIAN ASSISTANT: Karie Chimera PA-C   ASSISTANTS Karie Chimera PA-C  ANESTHESIA:   general  EBL:  Total I/O In: 1000 [I.V.:1000] Out: 200 [Blood:200]  BLOOD ADMINISTERED:none  DRAINS: none   LOCAL MEDICATIONS USED:  NONE  SPECIMEN:  No Specimen  DISPOSITION OF SPECIMEN:  N/A  COUNTS:  YES  TOURNIQUET:  * No tourniquets in log *  DICTATION: .Other Dictation: Dictation Number [id number]  PLAN OF CARE: Admit to inpatient   PATIENT DISPOSITION:  PACU - hemodynamically stable.   Delay start of Pharmacological VTE agent (>24hrs) due to surgical blood loss or risk of bleeding:  {YES/NO/NOT APPLICABLE:20182

## 2011-11-30 NOTE — Anesthesia Procedure Notes (Addendum)
Anesthesia Regional Block:  Interscalene brachial plexus block  Pre-Anesthetic Checklist: ,, timeout performed, Correct Patient, Correct Site, Correct Laterality, Correct Procedure, Correct Position, site marked, Risks and benefits discussed,  Surgical consent,  Pre-op evaluation,  At surgeon's request and post-op pain management  Laterality: Left  Prep: Maximum Sterile Barrier Precautions used and chloraprep       Needles:  Injection technique: Single-shot  Needle Type: Stimulator Needle - 40        Needle insertion depth: 5 cm   Additional Needles:  Procedures: nerve stimulator Interscalene brachial plexus block  Nerve Stimulator or Paresthesia:  Response: 0.5 mA, 0.1 ms, 4 cm  Additional Responses:   Narrative:  Start time: 11/30/2011 12:40 PM End time: 11/30/2011 12:45 PM Injection made incrementally with aspirations every 5 mL.  Performed by: Personally  Anesthesiologist: Maren Beach MD   Procedure Name: Intubation Date/Time: 11/30/2011 3:52 PM Performed by: Adria Dill Patient Re-evaluated:Patient Re-evaluated prior to inductionOxygen Delivery Method: Circle System Utilized Preoxygenation: Pre-oxygenation with 100% oxygen Intubation Type: IV induction Ventilation: Mask ventilation without difficulty Laryngoscope Size: Miller and 2 Grade View: Grade II Tube size: 7.5 mm Number of attempts: 1 Airway Equipment and Method: stylet Placement Confirmation: ETT inserted through vocal cords under direct vision,  positive ETCO2 and breath sounds checked- equal and bilateral Secured at: 21 cm Tube secured with: Tape Dental Injury: Teeth and Oropharynx as per pre-operative assessment

## 2011-11-30 NOTE — Preoperative (Signed)
Beta Blockers   Reason not to administer Beta Blockers:Not Applicable 

## 2011-11-30 NOTE — Transfer of Care (Signed)
Immediate Anesthesia Transfer of Care Note  Patient: Lynn [name] [facility]  Procedure(s) Performed:  SHOULDER ARTHROSCOPY WITH ROTATOR CUFF REPAIR AND SUBACROMIAL DECOMPRESSION - Left Shoulder Arthroscopy Sub Acrominal Decompression Mini Open Distal Clavicle resection Rotator Cuff Repair, Biceps Tenotomy   Patient Location: PACU  Anesthesia Type: GA combined with regional for post-op pain  Level of Consciousness: awake, alert , oriented and patient cooperative  Airway & Oxygen Therapy: Patient Spontanous Breathing and Patient connected to nasal cannula oxygen  Post-op Assessment: Report given to PACU RN, Post -op Vital signs reviewed and stable and Patient moving all extremities X 4  Post vital signs: Reviewed and stable  Complications: No apparent anesthesia complications

## 2011-12-01 LAB — GLUCOSE, CAPILLARY: Glucose-Capillary: 125 mg/dL — ABNORMAL HIGH (ref 70–99)

## 2011-12-01 NOTE — Progress Notes (Signed)
Subjective: 1 Day Post-Op Procedure(s) (LRB): SHOULDER ARTHROSCOPY WITH ROTATOR CUFF REPAIR AND SUBACROMIAL DECOMPRESSION (Left) The patient complains of significant pain in the shoulder and feels her sling may need readjusted. She is tolerating po's and voiding without difficulty. No BM. She denies nausea, fever, chills, sob, cp.     Objective: Vital signs in last 24 hours: Temp:  [98.2 F (36.8 C)-100.4 F (38 C)] 100.4 F (38 C) (01/25 2023) Pulse Rate:  [86-96] 86  (01/25 2023) Resp:  [16-20] 18  (01/25 2023) BP: (100-120)/(40-75) 100/40 mmHg (01/25 2023) SpO2:  [92 %-96 %] 96 % (01/25 2023) Intake/Output from previous day: 01/24 0701 - 01/25 0700 In: 1866.3 [P.O.:480; I.V.:1386.3] Out: 200 [Blood:200] Intake/Output this shift:    No results found for this basename: HGB:5 in the last 72 hours No results found for this basename: WBC:2,RBC:2,HCT:2,PLT:2 in the last 72 hours No results found for this basename: NA:2,K:2,CL:2,CO2:2,BUN:2,CREATININE:2,GLUCOSE:2,CALCIUM:2 in the last 72 hours No results found for this basename: LABPT:2,INR:2 in the last 72 hours  Pleasant, NAD, uncomfortable, A & O x3 Head: atraumatic, normocephalic Chest: respirations equal and non-labored Abdomen: NT LUE: sling readjusted with much improvement per the patient, Mild ecchymosis about trapezial/deltoid region, wound clean and dry, neurovascularly intact, hand and elbow rom intact  Assessment/Plan: 1 Day Post-Op Procedure(s) (LRB): SHOULDER ARTHROSCOPY WITH ROTATOR CUFF REPAIR AND SUBACROMIAL DECOMPRESSION (Left) We will continue pain management, encourage po meds, ambulate today and have therapy see her. All questions encouraged and answered. Lynn [name] L 12/01/2011, 9:16 PM

## 2011-12-01 NOTE — Progress Notes (Signed)
Occupational Therapy Evaluation Patient Details Name: Lynn [name] MRN: [id number] DOB: 02-Aug-1946 Today's Date: 12/01/2011  Problem List:  Patient Active Problem List  Diagnoses  . DIABETES MELLITUS, TYPE II  . HYPERLIPIDEMIA  . DEPRESSION  . DEGENERATION, MACULAR NOS  . UNSPECIFIED HEARING LOSS  . HYPERTENSION  . ASTHMA  . COPD  . DIVERTICULOSIS OF COLON  . DISC DISEASE, LUMBAR  . DIARRHEA  . COLONIC POLYPS, ADENOMATOUS, HX OF  . VERTIGO  . SEBORRHEIC KERATOSIS, INFLAMED    Past Medical History:  Past Medical History  Diagnosis Date  . DIABETES MELLITUS, TYPE II 03/14/2007  . HYPERLIPIDEMIA 06/21/2010  . DEGENERATION, MACULAR NOS 03/14/2007  . Unspecified hearing loss 01/13/2010  . HYPERTENSION 03/14/2007  . DIVERTICULOSIS OF COLON 01/11/2009  . DISC DISEASE, LUMBAR 05/25/2009  . Diarrhea 05/31/2007  . 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 VERTIGO 11/10/2010  . SEBORRHEIC KERATOSIS, INFLAMED 12/02/2010  . ASTHMA 03/14/2007    NO PROBELEM IN 12 YRS  . DEPRESSION 03/14/2007  . No pertinent past medical history     MENEIRS SYNDROME   . Lung abnormality     LT LUNG WITH SPOT HAS BEEN FOLLOWED X 3 YRS DR Edwyna Shell  . Blood dyscrasia     VON [place] "FREE BLEEDER"   Past Surgical History:  Past Surgical History  Procedure Date  . Cholecystectomy   . Tonsillectomy   . Spine surgery     fusion, ruptured disk  . Knee arthroscopy     right, TNR  . 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 Began education on hemi techniques for bathing and dressing. Spoke with Karie Chimera on phone to clarify movement with LUE. OK for L elbow/hand A/AAROM within pain tolerance and gentle pendulums L shoulder within pain tolerance. Pt will benefit from skilled OT  services to max indep with ADL with compensatory techniques to facilitate safe D/C home independently and reach below established goals. No DME needs. Pt will follow up with outpatient OT services as requested by MD. OT Recommendation/Assessment: Patient will need skilled OT in the acute care venue OT Problem List: Decreased strength;Decreased range of motion;Impaired UE functional use;Pain Barriers to Discharge: Decreased caregiver support OT Therapy Diagnosis : Generalized weakness;Acute pain OT Plan OT Frequency: Min 2X/week OT Treatment/Interventions: Self-care/ADL training;Therapeutic exercise;Therapeutic activities;Patient/family education OT Recommendation Follow Up Recommendations: Outpatient OT (as recommended by MD) Equipment Recommended: None recommended by OT Individuals Consulted Consulted and Agree with Results and Recommendations: Patient OT Goals Acute Rehab OT Goals OT Goal Formulation: With patient Time For Goal Achievement: 7 days ADL Goals Pt Will Perform Eating: with modified independence;Sitting, chair ADL Goal: Eating - Progress: Goal set today Pt Will Perform Grooming: with modified independence;Standing at sink ADL Goal: Grooming - Progress: Goal set today Pt Will Perform Upper Body Bathing: with modified independence;Sit to stand from chair ADL Goal: Upper Body Bathing - Progress: Goal set today Pt Will Perform Lower Body Bathing: with modified independence;Sit to stand from chair ADL Goal: Lower Body Bathing - Progress: Goal set today Pt Will Perform Upper Body Dressing: with modified independence;Sitting, chair ADL Goal: Upper Body Dressing - Progress: Goal set today Pt Will Perform Lower Body Dressing: with modified independence;Sit to stand from chair ADL Goal: Lower Body Dressing - Progress: Goal set today  Pt Will Transfer to Toilet: with modified independence;Comfort height toilet;Ambulation ADL Goal: Toilet Transfer - Progress: Goal set today Pt Will  Perform Toileting - Clothing Manipulation: with modified independence;Standing ADL Goal: Toileting - Clothing Manipulation - Progress: Goal set today Pt Will Perform Toileting - Hygiene: Independently ADL Goal: Toileting - Hygiene - Progress: Goal set today Arm Goals Additional Arm Goal #1: goal set today Arm Goal: Additional Goal #1 - Progress: Goal set today  OT Evaluation Precautions/Restrictions  Precautions Precautions: Shoulder Type of Shoulder Precautions: no active movement shoulder. NWB Precaution Booklet Issued: Yes (comment) Required Braces or Orthoses: Yes (sling) Restrictions Weight Bearing Restrictions: Yes (NWB) Prior Functioning Home Living Lives With: Alone Type of Home: House Home Layout: One level Home Access: Stairs to enter Secretary/administrator of Steps: 1 Bathroom Toilet: Handicapped height Bathroom Accessibility: Yes How Accessible: Accessible via walker Home Adaptive Equipment: Hand-held shower hose;Grab bars in shower;Built-in shower seat Prior Function Level of Independence: Independent with basic ADLs;Independent with homemaking with ambulation;Independent with gait;Independent with transfers Able to Take Stairs?: Yes Driving: Yes Vocation: Retired ADL ADL Eating/Feeding: Simulated;Set up Where Assessed - Eating/Feeding: Edge of bed Grooming: Performed;Wash/dry hands;Wash/dry face;Minimal assistance Where Assessed - Grooming: Sitting, bed;Unsupported Upper Body Bathing: Performed;Chest;Left arm;Abdomen;Minimal assistance Where Assessed - Upper Body Bathing: Sitting, bed;Unsupported Lower Body Bathing: Simulated;Minimal assistance Where Assessed - Lower Body Bathing: Sit to stand from bed Upper Body Dressing: Moderate assistance;Performed Upper Body Dressing Details (indicate cue type and reason): also educated on donning/doffing sling Where Assessed - Upper Body Dressing: Unsupported;Sitting, bed Lower Body Dressing: Simulated;Moderate  assistance Where Assessed - Lower Body Dressing: Sit to stand from bed Toilet Transfer: Simulated;Minimal assistance Toilet Transfer Method: Stand pivot Toilet Transfer Equipment: Comfort height toilet Toileting - Clothing Manipulation: Minimal assistance Where Assessed - Toileting Clothing Manipulation: Standing Toileting - Hygiene: Independent Where Assessed - Toileting Hygiene: Standing Tub/Shower Transfer: Not assessed Ambulation Related to ADLs: supervision ADL Comments: Overall Min/Mod A for UB ADL. Needs education on hemi dresssing/bathing techniques. Vision/Perception  Vision - History Baseline Vision: No visual deficits Patient Visual Report: No change from baseline Cognition Cognition Arousal/Alertness: Awake/alert Overall Cognitive Status: Appears within functional limits for tasks assessed Orientation Level: Oriented X4 Sensation/Coordination Sensation Light Touch: Appears Intact Coordination Gross Motor Movements are Fluid and Coordinated: No Fine Motor Movements are Fluid and Coordinated: No Coordination and Movement Description: due to immobilization from surgery/sling Extremity Assessment RUE Assessment RUE Assessment: Within Functional Limits LUE Assessment LUE Assessment: Exceptions to [facility] (Limitations in shoulder ROM. Elbow and hand WFL) Mobility  Bed Mobility Bed Mobility: Yes (Min A supine - sit/sit - supine) Transfers Transfers: Yes (supervision) Exercises Educated pt on A/AAROM L elbow/hand and gentle pendulum L shoulder. Given handout with written instructions.   End of Session OT - End of Session Equipment Utilized During Treatment: Gait belt;Other (comment) (sling) Activity Tolerance: Patient limited by pain Patient left: in bed;with call bell in reach Nurse Communication: Mobility status for transfers General Behavior During Session: [facility] for tasks performed Cognition: [facility] for tasks performed   [facility] 12/01/2011, 1:56 PM  [facility], OTR/L  [phone] 12/01/2011

## 2011-12-01 NOTE — Progress Notes (Signed)
Occupational Therapy Treatment Patient Details Name: Lynn [name] MRN: [id number] DOB: 1946/08/17 Today's Date: 12/01/2011  OT Assessment/Plan OT Assessment/Plan Comments on Treatment Session: Very appreciative of help. Motivated to achieve goals. OT Plan: Discharge plan remains appropriate OT Frequency: Min 2X/week Follow Up Recommendations: Outpatient OT Equipment Recommended: None recommended by OT OT Goals Acute Rehab OT Goals OT Goal Formulation: With patient Time For Goal Achievement: 7 days ADL Goals Pt Will Perform Eating: with modified independence;Sitting, chair ADL Goal: Eating - Progress: Progressing toward goals Pt Will Perform Grooming: with modified independence;Standing at sink ADL Goal: Grooming - Progress: Progressing toward goals Pt Will Perform Upper Body Bathing: with modified independence;Sit to stand from chair ADL Goal: Upper Body Bathing - Progress: Progressing toward goals Pt Will Perform Lower Body Bathing: with modified independence;Sit to stand from chair ADL Goal: Lower Body Bathing - Progress: Progressing toward goals Pt Will Perform Upper Body Dressing: with modified independence;Sitting, chair ADL Goal: Upper Body Dressing - Progress: Progressing toward goals Pt Will Perform Lower Body Dressing: with modified independence;Sit to stand from chair ADL Goal: Lower Body Dressing - Progress: Progressing toward goals Pt Will Transfer to Toilet: with modified independence;Comfort height toilet;Ambulation ADL Goal: Toilet Transfer - Progress: Progressing toward goals Pt Will Perform Toileting - Clothing Manipulation: with modified independence;Standing ADL Goal: Toileting - Clothing Manipulation - Progress: Progressing toward goals Pt Will Perform Toileting - Hygiene: Independently ADL Goal: Toileting - Hygiene - Progress: Progressing toward goals Arm Goals Additional Arm Goal #1: goal set today Arm Goal: Additional Goal #1 - Progress: Progressing  toward goals  OT Treatment Precautions/Restrictions  Precautions Precautions: Shoulder Type of Shoulder Precautions: no active movement shoulder. NWB Precaution Booklet Issued: Yes (comment) Required Braces or Orthoses: Yes (sling) Restrictions Weight Bearing Restrictions: Yes (NWB) Sensation Light Touch: Appears Intact Coordination Gross Motor Movements are Fluid and Coordinated: No Fine Motor Movements are Fluid and Coordinated: No Coordination and Movement Description: due to immobilization from surgery/sling ADL ADL Eating/Feeding: Simulated;Set up Where Assessed - Eating/Feeding: Edge of bed Grooming: Performed;Wash/dry hands;Wash/dry face;Minimal assistance Where Assessed - Grooming: Sitting, bed;Unsupported Upper Body Bathing: Performed;Chest;Left arm;Abdomen;Minimal assistance Where Assessed - Upper Body Bathing: Sitting, bed;Unsupported Lower Body Bathing: Simulated;Minimal assistance Where Assessed - Lower Body Bathing: Sit to stand from bed Upper Body Dressing: Moderate assistance;Performed Upper Body Dressing Details (indicate cue type and reason): also educated on donning/doffing sling Where Assessed - Upper Body Dressing: Unsupported;Sitting, bed Lower Body Dressing: Simulated;Moderate assistance Where Assessed - Lower Body Dressing: Sit to stand from bed Toilet Transfer: Simulated;Minimal assistance Toilet Transfer Method: Stand pivot Toilet Transfer Equipment: Comfort height toilet Toileting - Clothing Manipulation: Minimal assistance Where Assessed - Toileting Clothing Manipulation: Standing Toileting - Hygiene: Independent Where Assessed - Toileting Hygiene: Standing Tub/Shower Transfer: Not assessed Ambulation Related to ADLs: supervision ADL Comments: ADL retrining with focus on 1 handed techniques and using pendulum motion to donn/doff button up shirt and sling, in addition to cold pack Mobility  Bed Mobility Bed Mobility: Yes (Min A supine - sit/sit -  supine) Transfers Transfers: Yes (supervision) Exercises General Exercises - Upper Extremity Shoulder Horizontal ABduction:  (pendulum - gentle LUE) Elbow Flexion: AROM;AAROM Elbow Extension: AROM;AAROM Wrist Flexion: AROM Wrist Extension: AROM Digit Composite Flexion: AROM Composite Extension: AROM  End of Session OT - End of Session Equipment Utilized During Treatment: Gait belt Activity Tolerance: Patient limited by pain Patient left: in bed;with call bell in reach Nurse Communication: Mobility status for transfers General Behavior During Session: [facility] for tasks  performed Cognition: [facility] for tasks performed  Danaysia Rader,HILLARY  12/01/2011, 2:07 PM [facility], OTR/L  [phone] 12/01/2011

## 2011-12-01 NOTE — Op Note (Signed)
NAME:  Lynn [name], Lynn [name] NO.:  MEDICAL RECORD NO.:  [id number]  LOCATION:                                 FACILITY:  PHYSICIAN:  Dionne Ano. 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  POSTOPERATIVE DIAGNOSIS:  Left shoulder rotator cuff tear with impingement syndrome, biceps tendinosis, and acromioclavicular joint arthropathy.  PROCEDURES: 1. Evaluation under anesthesia, left shoulder. 2. Arthroscopy of left shoulder with glenohumeral debridement and     labral debridement, left shoulder. 3. Biceps tenotomy, arthroscopically performed, left shoulder. 4. Arthroscopic subacromial decompression and bursectomy, left     shoulder. 5. Open distal Mumford procedure (distal clavicle resection, open in     nature), left shoulder. 6. Mini open left rotator cuff repair involving the infraspinatus and     supraspinatus tendons.  SURGEON:  Dionne Ano. Amanda Pea, MD  ASSISTANT:  Karie Chimera, [facility]  COMPLICATIONS:  None.  ANESTHESIA:  General.  TOURNIQUET TIME:  0.  ESTIMATED BLOOD LOSS:  Minimal.  INDICATIONS FOR THE PROCEDURE:  This patient is a 66 year old female with a significant rotator cuff tear, early degenerative features, and loss of function.  I have counseled her in regard to risks, benefits, and surgery, and she desires to proceed with the above-mentioned operative intervention.  I have specifically discussed with her that our goal is to rid her of the rotator cuff tear so that hopefully degenerative changes will not continue to ensue.  Once the patients have a intact rotator cuff, they have a better chance of having good joint stability and less degenerative wear.  This is our hope for the patient. We are also going to  plan to address her spur process, AC joint arthropathy and inflammation.  She understands this and the risks and benefits.  OPERATIVE PROCEDURE IN DETAIL:  The patient was seen by myself and Anesthesia.  An infraclavicular block was induced in the holding area and this worked excellent for pain control.  Following this, she was taken to the operative suite, laid supine and fully padded, prepped and draped in the usual sterile fashion with Betadine scrub and paint, followed by DuraPrep.  She was placed in a beach chair position well padded, SCD hose were placed, and all body parts were checked and pulse was evaluated.  Once this was done, the patient then underwent evaluation under anesthesia.  There were no locking, popping, catching, or advanced adhesions noted; however, she had a slight loss of external rotation and this was noted with the arm in full abduction.  Once this was noted, I then performed a posterolateral portal.  This was made without difficulty.  Under localization, I 1st insufflated the joint with saline and following this, I went ahead and introduced the cannula and then the arthroscope.  Once this was done, anterior working portal was created and once this was performed, I performed arthroscopic evaluation.  She had glenohumeral degenerative changes.  She had some full-thickness wear centrally in the glenoid as well as  a small bit of full-thickness change in the humerus.  I noted this and documented this. The patient also had mild to moderate degree of significant labral degeneration.  This was cleaned up with a combination of shaver and arthroscopic ablator.  I made sure the ablator did not touch any cartilage surfaces.  The biceps was well worn in a poor condition and thus, we performed an arthroscopic biceps tenotomy, followed by debridement of the superior labrum.  She had a definitive rotator cuff tear identified and it was noted that this was fairly large.  Once  this was done, I checked her for stability and debrided some synovitis in the posterior and anterior regions.  She had no evidence of loose body. Inferior pouch was stable.  Once this was done, I then placed the arthroscope in the subacromial space, made a lateral working portal and performed a subacromial decompression and bursectomy with a combination of ablator, shaver, and bur.  The patient had a very nice subacromial decompression performed, the rotator cuff was once again verified.  Following this, I then noted the distal clavicle was rather robust and unyielding, I felt a mini open excision would be in her best interest. Thus, I made a small incision, dissected down, and resected the distal 1 cm of her distal clavicle, then placed bone wax against it and then closed the fascia with 0 Vicryl.  Subcu with 3-0 Vicryl and the skin edge with Prolene at the end of the case.  She had a very robust and hugely looking deformed distal clavicle.  Following this, I then noted plans for mini open rotator cuff repair.  I made a 2- to 2.5-inch incision just off the anterior leading edge of the acromion, dissection was carried down.  Interval was made between the anterior and middle deltoid raphe and then I placed a retractor into position.  Once this was done, I performed a bursectomy additionally as necessary and identified a very large rotator cuff tear.  I prepared the rotator cuff sharply with knife blade, mobilized it without difficulty, I should note, and then made a footprint for reinsertion.  The footprint was made for two 2.9 Jugger knot sutures placed without difficulty and there were 8 exiting sutures.  These sutures were placed with scorpion patch through the medial portion of the cuff and then tied down.  There were 4 knots and these were then placed back over the cuff for a double vested repair to allow it to lay down nicely.  This was done to my satisfaction without difficulty.   Once they were allowed to lay down nicely, we then fixed them with a PushLock.  A 4.5 PushLock was used to allow for excellent drape over position and I was quite pleased with this.  Once this was complete, we then irrigated and evaluated the area. She had excellent-looking repair, excellent decompression of the subacromial space and distal clavicle, and a closure of the deltoid with 0 Vicryl and subcu with 3-0 Vicryl followed by subcuticular Prolene was accomplished.  She was placed in a sterile dressing, and then taken to the recovery room.  Shoulder abduction pillow will be adhered to.  For rehabilitation purposes, we will plan for passive range of motion 0-5 weeks, active assisted range of motion 5-10, and active range of motion greater than 10 weeks.  I have discussed with her these notes, etc. Given the early arthritis, I would expect a little bit of motion loss but hopefully, she will have a  good sound rotator cuff that will not allow further propagation of her tear problems.  These notes have been discussed and all questions were encouraged and answered.    Dionne Ano. Amanda Pea, M.D.    [facility]  D:  11/30/2011  T:  12/01/2011  Job:  [id number]

## 2011-12-01 NOTE — Progress Notes (Signed)
PT Cancellation Note  PT orders received and chart reviewed. Eval cancelled today due to spoke with OT after eval, who reports pt is walking well with good balance and does not require PT at this time. Please re-order PT if needed. Thank you.   [address] [place], [place] [phone]  12/01/2011, 2:01 PM

## 2011-12-02 LAB — GLUCOSE, CAPILLARY
Glucose-Capillary: 155 mg/dL — ABNORMAL HIGH (ref 70–99)
Glucose-Capillary: 153 mg/dL — ABNORMAL HIGH (ref 70–99)

## 2011-12-02 NOTE — Progress Notes (Signed)
Subjective: 2 Days Post-Op Procedure(s) (LRB): SHOULDER ARTHROSCOPY WITH ROTATOR CUFF REPAIR AND SUBACROMIAL DECOMPRESSION (Left) Patient reports pain as moderate.    Objective: Vital signs in last 24 hours: Temp:  [98.2 F (36.8 C)-100.4 F (38 C)] 98.3 F (36.8 C) (01/26 0519) Pulse Rate:  [85-89] 85  (01/26 0519) Resp:  [16-18] 18  (01/26 0519) BP: (99-120)/(40-75) 99/48 mmHg (01/26 0519) SpO2:  [92 %-96 %] 92 % (01/26 0519)  Intake/Output from previous day: 01/25 0701 - 01/26 0700 In: 240 [P.O.:240] Out: -  Intake/Output this shift:    No results found for this basename: HGB:5 in the last 72 hours No results found for this basename: WBC:2,RBC:2,HCT:2,PLT:2 in the last 72 hours No results found for this basename: NA:2,K:2,CL:2,CO2:2,BUN:2,CREATININE:2,GLUCOSE:2,CALCIUM:2 in the last 72 hours No results found for this basename: LABPT:2,INR:2 in the last 72 hours  physical exam: Neurologically intact ABD soft Neurovascular intact Sensation intact distally Intact pulses distally Dorsiflexion/Plantar flexion intact No cellulitis present Compartment soft Overall she looks quite well without complicating features.    Assessment/Plan: 2 Days Post-Op Procedure(s) (LRB): SHOULDER ARTHROSCOPY WITH ROTATOR CUFF REPAIR AND SUBACROMIAL DECOMPRESSION (Left) Advance diet Up with therapy Plan for discharge tomorrow  Given the fact that she lives alone we want her to be very independent prior to discharge with good pain control as well as cognitive abilities.  All these and does have been discussed. Overall I think she looks very well at this juncture.  Karen Chafe 12/02/2011, 6:55 AM

## 2011-12-02 NOTE — Progress Notes (Signed)
Occupational Therapy Treatment Patient Details Name: Lynn [name] MRN: [id number] DOB: 08-22-1946 Today's Date: 12/02/2011  OT Assessment/Plan OT Assessment/Plan Comments on Treatment Session: pt did well.  Pt needed verbal cues to relax LUE for pendulum exercise.   OT Plan: Discharge plan remains appropriate OT Goals ADL Goals ADL Goal: Upper Body Dressing - Progress: Progressing toward goals Arm Goals Arm Goal: Additional Goal #1 - Progress: Progressing toward goals        ADL ADL Grooming: Performed;Wash/dry face;Supervision/safety Where Assessed - Grooming: Standing at sink Upper Body Dressing: Performed;Minimal assistance Upper Body Dressing Details (indicate cue type and reason): one arm dressing technique Where Assessed - Upper Body Dressing: Standing    Exercises Shoulder Exercises Pendulum Exercise: Standing;Other (comment) (pt performed pendulum exercise in standing.  pt  min A) Hand Exercises Wrist Flexion: AROM;Seated;10 reps;Left Wrist Extension: AROM;Left;10 reps;Seated Digit Composite Flexion: AROM;Left;Seated Composite Extension: AROM;Left;Seated  End of Session OT - End of Session Activity Tolerance: Patient tolerated treatment well Patient left: in bed;with call bell in reach General Behavior During Session: [facility] for tasks performed  [facility], [place]  12/02/2011, 2:06 PM

## 2011-12-03 MED ORDER — METHOCARBAMOL 500 MG PO TABS: 500.0000 mg | ORAL_TABLET | Freq: Four times a day (QID) | ORAL | Status: AC | PRN

## 2011-12-03 MED ORDER — OXYCODONE HCL 5 MG PO TABS: 5.0000 mg | ORAL_TABLET | ORAL | Status: AC | PRN

## 2011-12-03 NOTE — Discharge Summary (Signed)
Physician Discharge Summary  Patient ID: Lynn [name] MRN: [id number] DOB/AGE: [birth year] 66 y.o.  Admit date: 11/30/2011 Discharge date: 12/03/2011  Admission Diagnoses:left shoulder RCT-rotator cuff tear  Discharge Diagnoses: same Active Problems:  * No active hospital problems. *    Discharged Condition: good  Hospital Course: Patient did very well post op.  She did nicely re: left shoulder and resumption of independece. Marland Kitchen.The patient is alert and oriented in no acute distress the patient complains of pain in the affected upper extremity. The patient is noted to have a normal HEENT exam. Lung fields show equal chest expansion and no shortness of breath abdomen exam is nontender without distention. Lower extremity examination does not show any fracture dislocation or blood clot symptoms. Pelvis is stable neck and back are stable and nontender  Consults: None    Treatments: surgery: Rotator cuff repair  Discharge Exam: Blood pressure 115/52, pulse 76, temperature 99 F (37.2 C), temperature source Oral, resp. rate 18, height 5\' 8"  (1.727 m), weight 96.7 kg (213 lb 3 oz), SpO2 97.00%. General appearance: alert and cooperative .Marland KitchenThe patient is alert and oriented in no acute distress the patient complains of pain in the affected upper extremity. The patient is noted to have a normal HEENT exam. Lung fields show equal chest expansion and no shortness of breath abdomen exam is nontender without distention. Lower extremity examination does not show any fracture dislocation or blood clot symptoms. Pelvis is stable neck and back are stable and nontender Disposition: Home or Self Care  Discharge Orders    Future Appointments: Provider: Department: Dept Phone: Center:   03/12/2012 8:15 AM [facility] [phone] [facility]   03/19/2012 10:30 AM Evette Georges, MD [facility] [phone] [facility]     Medication List  As of 12/03/2011 10:06 AM   STOP taking these  medications         glucose blood test strip      HYDROcodone-acetaminophen 7.5-750 MG per tablet         TAKE these medications         aspirin 81 MG tablet   Take 81 mg by mouth daily.      atorvastatin 40 MG tablet   Commonly known as: LIPITOR   Take 1 tablet (40 mg total) by mouth daily.      citalopram 20 MG tablet   Commonly known as: CELEXA   Take 1 tablet (20 mg total) by mouth daily.      docusate sodium 100 MG capsule   Commonly known as: COLACE   Take 100 mg by mouth daily as needed. For stool softner      lisinopril-hydrochlorothiazide 20-25 MG per tablet   Commonly known as: PRINZIDE,ZESTORETIC   Take 1 tablet by mouth daily.      metFORMIN 500 MG tablet   Commonly known as: GLUCOPHAGE   Take 1 tablet (500 mg total) by mouth daily with breakfast.      methocarbamol 500 MG tablet   Commonly known as: ROBAXIN   Take 1 tablet (500 mg total) by mouth every 6 (six) hours as needed.      OCUVITE PO   Take 1 tablet by mouth daily.      oxyCODONE 5 MG immediate release tablet   Commonly known as: Oxy IR/ROXICODONE   Take 1-2 tablets (5-10 mg total) by mouth every 3 (three) hours as needed.      zolpidem 5 MG tablet   Commonly known as: AMBIEN   Take  1 tablet (5 mg total) by mouth at bedtime as needed for sleep.             SignedKaren Chafe 12/03/2011, 10:06 AM

## 2011-12-22 ENCOUNTER — Encounter: Payer: Self-pay | Admitting: Family

## 2011-12-22 ENCOUNTER — Ambulatory Visit (INDEPENDENT_AMBULATORY_CARE_PROVIDER_SITE_OTHER): Payer: Medicare Other | Admitting: Family

## 2011-12-22 DIAGNOSIS — M25519 Pain in unspecified shoulder: Secondary | ICD-10-CM

## 2011-12-22 DIAGNOSIS — J019 Acute sinusitis, unspecified: Secondary | ICD-10-CM

## 2011-12-22 DIAGNOSIS — M25512 Pain in left shoulder: Secondary | ICD-10-CM

## 2011-12-22 MED ORDER — CEFUROXIME AXETIL 250 MG PO TABS: 250.0000 mg | ORAL_TABLET | Freq: Two times a day (BID) | ORAL | Status: DC

## 2011-12-22 NOTE — Progress Notes (Signed)
  Subjective:    Patient ID: Lynn [name], female    DOB: Oct 03, 1946, 66 y.o.   MRN: [id number]  HPI 65y/o WF, nonsmoker, patient of Dr. Tawanna Cooler is in today with c/o fever (101), chills, nasal congestion, yellow-green sputum and sinus pressure x 3 days. OTC meds no relief. Denies lightheaded, dizziness, SOB, CP, palpitations, or edema. Positive exposure to illness. She has a pmhx of pneumonia. Shx: Left shoulder surgery 3 weeks ago.    Review of Systems  Constitutional: Positive for fever, chills and fatigue. Negative for appetite change.  HENT: Positive for congestion, rhinorrhea, sneezing, postnasal drip and sinus pressure.   Eyes: Negative.   Respiratory: Positive for cough. Negative for wheezing.   Cardiovascular: Negative.   Gastrointestinal: Negative.   Musculoskeletal:       Left shoulder pain  Neurological: Negative.   Hematological: Negative.   Psychiatric/Behavioral: Negative.        Objective:   Physical Exam  Constitutional: She is oriented to person, place, and time. She appears well-developed and well-nourished.  HENT:  Right Ear: External ear normal.  Left Ear: External ear normal.  Nose: Nose normal.  Mouth/Throat: Oropharynx is clear and moist.  Neck: Normal range of motion. Neck supple.  Cardiovascular: Normal rate, regular rhythm and normal heart sounds.   Pulmonary/Chest: Effort normal and breath sounds normal.  Abdominal: Soft. Bowel sounds are normal.  Neurological: She is alert and oriented to person, place, and time.  Skin: Skin is warm and dry.  Psychiatric: She has a normal mood and affect.          Assessment & Plan:  Assessment: Acute Sinusitis, Recent surgery-Left shoulder pain  Plan: Ceftin 250mg  BID x 7 days. Prednisone 40 mg daily x 5 days (patient says she has prednisone at home left from a previous illness not taken). Continue current pain meds. Covering with antibiotics bc of pmhx of pneumonia and recent joint surgery. Call the office  if symptoms worsen or persist. Recheck as scheduled and as needed.

## 2011-12-26 ENCOUNTER — Telehealth: Payer: Self-pay | Admitting: Family Medicine

## 2011-12-26 NOTE — Telephone Encounter (Signed)
Spoke with patient and went over directions for prednisone and she will call back if no improvement

## 2011-12-26 NOTE — Telephone Encounter (Signed)
Patient requests a call to explain her prednisone as she has forgot how to take it. Please assist.

## 2011-12-28 ENCOUNTER — Ambulatory Visit (INDEPENDENT_AMBULATORY_CARE_PROVIDER_SITE_OTHER): Payer: Medicare Other | Admitting: Family Medicine

## 2011-12-28 ENCOUNTER — Encounter: Payer: Self-pay | Admitting: Family Medicine

## 2011-12-28 VITALS — BP 120/70 | Temp 98.5°F

## 2011-12-28 DIAGNOSIS — J45909 Unspecified asthma, uncomplicated: Secondary | ICD-10-CM

## 2011-12-28 MED ORDER — PREDNISONE 20 MG PO TABS: ORAL_TABLET | ORAL | Status: DC

## 2011-12-28 MED ORDER — HYDROCODONE-HOMATROPINE 5-1.5 MG/5ML PO SYRP: 5.0000 mL | ORAL_SOLUTION | Freq: Three times a day (TID) | ORAL | Status: DC | PRN

## 2011-12-28 NOTE — Progress Notes (Signed)
  Subjective:    Patient ID: Edmonia James, female    DOB: 05/10/1946, 66 y.o.   MRN: [id number]  HPI Samara Deist is a 66 year old single female nonsmoker who comes in today for evaluation of asthma  Week ago she developed head congestion postnasal drip and nonproductive cough. She came in here and was given Ceftin 250 mg twice a day??????????? with no documentation of a bacterial infection. She went home and then developed asthma. She had some old prednisone and she took 20 mg a day for 3 days then 10 for 3 days however she still wheezing. No fever no sputum production   Review of Systems General and pulmonary review of systems otherwise negative    Objective:   Physical Exam  Well-developed well-nourished female in no acute distress HEENT negative neck was supple no adenopathy lungs showed symmetrical breath sounds mild to moderate late expiratory wheezing      Assessment & Plan:  Asthma plan prednisone burst and taper drink lots of liquids Hydromet when necessary for cough return Monday for followup

## 2011-12-28 NOTE — Patient Instructions (Signed)
Take 3 prednisone tablets when you get home now been take 3 tablets every morning for 3 days or until you feel a whole lot better then begin to taper  Rest at home  Drink lots of water  Vaporizer in her bedroom at night  Hydromet 1/2-1 teaspoon 3 times a day when necessary for cough  Return on Monday for followup

## 2012-01-01 ENCOUNTER — Encounter: Payer: Self-pay | Admitting: Family Medicine

## 2012-01-01 ENCOUNTER — Ambulatory Visit (INDEPENDENT_AMBULATORY_CARE_PROVIDER_SITE_OTHER): Payer: Medicare Other | Admitting: Family Medicine

## 2012-01-01 VITALS — BP 100/60

## 2012-01-01 DIAGNOSIS — J45909 Unspecified asthma, uncomplicated: Secondary | ICD-10-CM

## 2012-01-01 MED ORDER — DOXYCYCLINE HYCLATE 100 MG PO TABS: 100.0000 mg | ORAL_TABLET | Freq: Two times a day (BID) | ORAL | Status: AC

## 2012-01-01 MED ORDER — ALBUTEROL SULFATE (2.5 MG/3ML) 0.083% IN NEBU: 2.5000 mg | INHALATION_SOLUTION | Freq: Four times a day (QID) | RESPIRATORY_TRACT | Status: DC | PRN

## 2012-01-01 NOTE — Progress Notes (Signed)
  Subjective:    Patient ID: Lynn [name], female    DOB: 05-05-1946, 66 y.o.   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 She is on 40 mg of prednisone a day. She took 60 mg for 3 days. No fever or discolored sputum production per usual  Blood sugar normal  She needs the tubing and albuterol she does have a nebulizer machine at home.   Review of Systems    general and pulmonary review of systems otherwise negative she did sleep last night without the cough syrup Objective:   Physical Exam  Well-developed well-nourished female in no acute distress HEENT negative neck was supple no adenopathy lungs show symmetrical breath sounds mild to moderate inspiratory and expiratory wheezing      Assessment & Plan:  Asthma resolving slowly continue prednisone 40 mg daily and doxycycline 100 twice a day use nebulizer at home 4 times a day followup in one week

## 2012-01-01 NOTE — Patient Instructions (Signed)
Continue the prednisone 40 mg daily  Nebulizer with albuterol 4 times daily  Doxycycline 100 mg twice a day  Return in one week

## 2012-01-08 ENCOUNTER — Ambulatory Visit: Payer: Medicare Other | Admitting: Family Medicine

## 2012-01-08 ENCOUNTER — Encounter: Payer: Self-pay | Admitting: Family Medicine

## 2012-01-08 ENCOUNTER — Ambulatory Visit (INDEPENDENT_AMBULATORY_CARE_PROVIDER_SITE_OTHER): Payer: Medicare Other | Admitting: Family Medicine

## 2012-01-08 VITALS — BP 104/60

## 2012-01-08 DIAGNOSIS — J45909 Unspecified asthma, uncomplicated: Secondary | ICD-10-CM

## 2012-01-08 NOTE — Patient Instructions (Signed)
prednisone 20 mg x2 days, 10 mg x2 days, then 10 mg every other day for a 2 week taper  Stop the nebulizer  Return when necessary

## 2012-01-08 NOTE — Progress Notes (Signed)
  Subjective:    Patient ID: Lynn [name], female    DOB: 1946/03/28, 66 y.o.   MRN: [id number]  HPI Lynn [name] is a 66 year old female who comes in today for followup of asthma  We saw her last week with a flareup of her asthma triggered by viral infections. We put her on a nebulizer 4 times a day with albuterol, prednisone 40 mg daily, she comes back today for followup stating she feels fine she's wheezing free and no side effects from the medication except increase her blood sugar. She took a half of a 500 mg metformin and evening and blood sugars in the 80 range now.   Review of Systems    general pulmonary review of systems otherwise negative Objective:   Physical Exam  Well-developed well-nourished female in no acute distress lungs are clear no wheezing      Assessment & Plan:  Acute asthma resolved with medication plan Taper prednisone slowly return when necessary

## 2012-03-12 ENCOUNTER — Other Ambulatory Visit (INDEPENDENT_AMBULATORY_CARE_PROVIDER_SITE_OTHER): Payer: Medicare Other

## 2012-03-12 DIAGNOSIS — E119 Type 2 diabetes mellitus without complications: Secondary | ICD-10-CM

## 2012-03-12 DIAGNOSIS — IMO0001 Reserved for inherently not codable concepts without codable children: Secondary | ICD-10-CM

## 2012-03-12 LAB — POCT URINALYSIS DIPSTICK
Protein, UA: NEGATIVE
Spec Grav, UA: 1.02
Urobilinogen, UA: 1
pH, UA: 6

## 2012-03-12 LAB — MICROALBUMIN / CREATININE URINE RATIO
Creatinine,U: 189.4 mg/dL
Microalb Creat Ratio: 1 mg/g (ref 0.0–30.0)
Microalb, Ur: 1.9 mg/dL (ref 0.0–1.9)

## 2012-03-12 LAB — HEPATIC FUNCTION PANEL
AST: 17 U/L (ref 0–37)
Albumin: 3.9 g/dL (ref 3.5–5.2)
Total Bilirubin: 0.6 mg/dL (ref 0.3–1.2)

## 2012-03-12 LAB — BASIC METABOLIC PANEL
BUN: 19 mg/dL (ref 6–23)
Calcium: 9.4 mg/dL (ref 8.4–10.5)
Creatinine, Ser: 0.8 mg/dL (ref 0.4–1.2)

## 2012-03-12 LAB — LIPID PANEL
HDL: 64.2 mg/dL (ref >39.00)
LDL Cholesterol: 79 mg/dL (ref 0–99)
Total CHOL/HDL Ratio: 3
Triglycerides: 98 mg/dL (ref 0.0–149.0)
VLDL: 19.6 mg/dL (ref 0.0–40.0)

## 2012-03-12 LAB — CBC WITH DIFFERENTIAL/PLATELET
Basophils Relative: 0.5 % (ref 0.0–3.0)
Eosinophils Relative: 3.6 % (ref 0.0–5.0)
HCT: 43.5 % (ref 36.0–46.0)
Hemoglobin: 14 g/dL (ref 12.0–15.0)
Lymphs Abs: 2 10*3/uL (ref 0.7–4.0)
MCV: 92.1 fl (ref 78.0–100.0)
Monocytes Relative: 10.2 % (ref 3.0–12.0)
Platelets: 175 10*3/uL (ref 150.0–400.0)
RBC: 4.73 Mil/uL (ref 3.87–5.11)
WBC: 6.4 10*3/uL (ref 4.5–10.5)

## 2012-03-12 LAB — HEMOGLOBIN A1C: Hgb A1c MFr Bld: 6.7 % — ABNORMAL HIGH (ref 4.6–6.5)

## 2012-03-18 ENCOUNTER — Encounter: Payer: Medicare Other | Admitting: Family Medicine

## 2012-03-19 ENCOUNTER — Encounter: Payer: Medicare Other | Admitting: Family Medicine

## 2012-03-25 ENCOUNTER — Encounter: Payer: Self-pay | Admitting: Family Medicine

## 2012-03-25 ENCOUNTER — Ambulatory Visit (INDEPENDENT_AMBULATORY_CARE_PROVIDER_SITE_OTHER): Payer: Medicare Other | Admitting: Family Medicine

## 2012-03-25 VITALS — BP 110/68 | Temp 98.7°F

## 2012-03-25 DIAGNOSIS — F3289 Other specified depressive episodes: Secondary | ICD-10-CM

## 2012-03-25 DIAGNOSIS — J45909 Unspecified asthma, uncomplicated: Secondary | ICD-10-CM

## 2012-03-25 DIAGNOSIS — I10 Essential (primary) hypertension: Secondary | ICD-10-CM

## 2012-03-25 DIAGNOSIS — E119 Type 2 diabetes mellitus without complications: Secondary | ICD-10-CM

## 2012-03-25 DIAGNOSIS — F329 Major depressive disorder, single episode, unspecified: Secondary | ICD-10-CM

## 2012-03-25 DIAGNOSIS — E785 Hyperlipidemia, unspecified: Secondary | ICD-10-CM

## 2012-03-25 DIAGNOSIS — H9190 Unspecified hearing loss, unspecified ear: Secondary | ICD-10-CM

## 2012-03-25 MED ORDER — LISINOPRIL-HYDROCHLOROTHIAZIDE 20-25 MG PO TABS: 1.0000 | ORAL_TABLET | Freq: Every day | ORAL | Status: DC

## 2012-03-25 MED ORDER — ZOLPIDEM TARTRATE 5 MG PO TABS: 5.0000 mg | ORAL_TABLET | Freq: Every evening | ORAL | Status: DC | PRN

## 2012-03-25 MED ORDER — CITALOPRAM HYDROBROMIDE 20 MG PO TABS: 20.0000 mg | ORAL_TABLET | Freq: Every day | ORAL | Status: DC

## 2012-03-25 MED ORDER — METFORMIN HCL 500 MG PO TABS: 500.0000 mg | ORAL_TABLET | Freq: Every day | ORAL | Status: DC

## 2012-03-25 NOTE — Patient Instructions (Signed)
Continue your current medications  Followup in 1 year sooner if any problems 

## 2012-03-25 NOTE — Progress Notes (Signed)
  Subjective:    Patient ID: Lynn [name], female    DOB: February 09, 1946, 66 y.o.   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 She's been working hard with her diet and exercise and has lost 20 pounds over the past year.  She gets routine eye care, hearing loss total right ear, regular dental care, BSE monthly, and you mammography, colonoscopy and GI, vaccinations up-to-date  Cognitive function normal she walks on a regular basis home health safety reviewed no issues identified, no guns in the house, she does have a health care power of attorney and living will   Review of Systems  Constitutional: Negative.   HENT: Negative.   Eyes: Negative.   Respiratory: Negative.   Cardiovascular: Negative.   Gastrointestinal: Negative.   Genitourinary: Negative.   Musculoskeletal: Negative.   Neurological: Negative.   Hematological: Negative.   Psychiatric/Behavioral: Negative.        Objective:   Physical Exam  Constitutional: She appears well-developed and well-nourished.  HENT:  Head: Normocephalic and atraumatic.  Right Ear: External ear normal.  Left Ear: External ear normal.  Nose: Nose normal.  Mouth/Throat: Oropharynx is clear and moist.  Eyes: EOM are normal. Pupils are equal, round, and reactive to light.  Neck: Normal range of motion. Neck supple. No thyromegaly present.  Cardiovascular: Normal rate, regular rhythm, normal heart sounds and intact distal pulses.  Exam reveals no  gallop and no friction rub.   No murmur heard. Pulmonary/Chest: Effort normal and breath sounds normal.  Abdominal: Soft. Bowel sounds are normal. She exhibits no distension and no mass. There is no tenderness. There is no rebound.  Genitourinary:       Bilateral breast exam normal  Musculoskeletal: Normal range of motion.  Lymphadenopathy:    She has no cervical adenopathy.  Neurological: She is alert. She has normal reflexes. No cranial nerve deficit. She exhibits normal muscle tone. Coordination normal.  Skin: Skin is warm and dry.  Psychiatric: She has a normal mood and affect. Her behavior is normal. Judgment and thought content normal.          Assessment & Plan:  Healthy female  Hyperlipidemia continue Lipitor 40 mg daily and aspirin tablet  History of depression continue Celexa 20 mg daily  Hypertension continue Zestoretic one daily  Type 2 diabetes continue metformin 500 mg daily  Sleep dysfunction Ambien 5 mg one half tab each bedtime when necessary  Hearing loss right ear  Status post shoulder surgery left January 2013

## 2012-07-16 ENCOUNTER — Encounter: Payer: Self-pay | Admitting: Family Medicine

## 2012-07-16 ENCOUNTER — Ambulatory Visit (INDEPENDENT_AMBULATORY_CARE_PROVIDER_SITE_OTHER): Payer: Medicare Other | Admitting: Family Medicine

## 2012-07-16 VITALS — BP 110/70 | HR 86 | Temp 98.5°F

## 2012-07-16 DIAGNOSIS — J45909 Unspecified asthma, uncomplicated: Secondary | ICD-10-CM

## 2012-07-16 DIAGNOSIS — J4489 Other specified chronic obstructive pulmonary disease: Secondary | ICD-10-CM

## 2012-07-16 DIAGNOSIS — J449 Chronic obstructive pulmonary disease, unspecified: Secondary | ICD-10-CM

## 2012-07-16 MED ORDER — PREDNISONE 20 MG PO TABS: ORAL_TABLET | ORAL | Status: DC

## 2012-07-16 MED ORDER — HYDROCODONE-HOMATROPINE 5-1.5 MG/5ML PO SYRP: 5.0000 mL | ORAL_SOLUTION | Freq: Four times a day (QID) | ORAL | Status: AC | PRN

## 2012-07-16 NOTE — Progress Notes (Signed)
  Subjective:    Patient ID: Lynn [name], female    DOB: 1946-04-10, 66 y.o.   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 She's used her nebulizer 3 or 4 times a day but hasn't helped. She has no fever chills   Review of Systems    general and pulmonary review of systems otherwise negative Objective:   Physical Exam Well-developed well-nourished female slightly overweight HEENT were negative neck was supple no adenopathy lungs showed decreased breath sounds consistent with underlying COPD and bilateral symmetrical late expiratory mild wheezing       Assessment & Plan:  Asthma plan prednisone burst and taper return in

## 2012-07-16 NOTE — Patient Instructions (Signed)
Rest at home  Usual her nebulizer 4 times daily  Begin the prednisone as directed  Followup on Thursday  Hydromet 1/2-1 teaspoon 3 times a day  Remember to drink lots of water

## 2012-07-18 ENCOUNTER — Ambulatory Visit (INDEPENDENT_AMBULATORY_CARE_PROVIDER_SITE_OTHER): Payer: Medicare Other | Admitting: Family Medicine

## 2012-07-18 ENCOUNTER — Encounter: Payer: Self-pay | Admitting: Family Medicine

## 2012-07-18 VITALS — BP 108/64

## 2012-07-18 DIAGNOSIS — J45909 Unspecified asthma, uncomplicated: Secondary | ICD-10-CM

## 2012-07-18 NOTE — Progress Notes (Signed)
  Subjective:    Patient ID: Lynn [name], female    DOB: [birth year], 66 y.o.   MRN: [id number]  HPI Lynn [name] is a 66 year old female nonsmoker who comes in today for followup of asthma  We saw her the other day with a flare of her asthma start her on 60 mg of prednisone daily she comes in today saying she feels about 90% better. No side effects from the prednisone except some insomnia   Review of Systems General and pulmonary review of systems otherwise negative    Objective:   Physical Exam Well-developed well-nourished female no acute distress lungs showed good breath sounds bilaterally some very very mild late expiratory wheezing       Assessment & Plan:  Asthma resolving plan prednisone taper return when necessary

## 2012-07-18 NOTE — Patient Instructions (Signed)
Taper the prednisone by taking one tablet daily for 3 days, a half a tab for 3 days, then a half a tablet every other day for 2 weeks

## 2012-08-26 ENCOUNTER — Ambulatory Visit (INDEPENDENT_AMBULATORY_CARE_PROVIDER_SITE_OTHER): Payer: Medicare Other | Admitting: Family Medicine

## 2012-08-26 DIAGNOSIS — Z23 Encounter for immunization: Secondary | ICD-10-CM

## 2012-08-27 ENCOUNTER — Ambulatory Visit: Payer: Medicare Other | Admitting: Family Medicine

## 2012-10-14 ENCOUNTER — Telehealth: Payer: Self-pay | Admitting: Family Medicine

## 2012-10-14 DIAGNOSIS — F3289 Other specified depressive episodes: Secondary | ICD-10-CM

## 2012-10-14 DIAGNOSIS — F329 Major depressive disorder, single episode, unspecified: Secondary | ICD-10-CM

## 2012-10-14 MED ORDER — ZOLPIDEM TARTRATE 5 MG PO TABS: 5.0000 mg | ORAL_TABLET | Freq: Every evening | ORAL | Status: DC | PRN

## 2012-10-14 NOTE — Telephone Encounter (Signed)
Pt needs temporary supply (10-15 day) of generic Ambien 5 mg sent to [facility] on [address] [phone]. She has two pills left and need enough to get through this month before insurance changes.  Insurance will change Jan 1st, 2014 and pharmacy will change to [facility] on [address].  After change in insurance all meds will need to be renewed through [facility].

## 2012-10-14 NOTE — Telephone Encounter (Signed)
Rx called into [facility]. [facility] changed to pharmacy. Left message on machine.

## 2012-10-24 ENCOUNTER — Other Ambulatory Visit: Payer: Self-pay | Admitting: *Deleted

## 2012-10-24 DIAGNOSIS — F329 Major depressive disorder, single episode, unspecified: Secondary | ICD-10-CM

## 2012-10-24 DIAGNOSIS — I10 Essential (primary) hypertension: Secondary | ICD-10-CM

## 2012-10-24 DIAGNOSIS — F3289 Other specified depressive episodes: Secondary | ICD-10-CM

## 2012-10-24 DIAGNOSIS — E785 Hyperlipidemia, unspecified: Secondary | ICD-10-CM

## 2012-10-24 DIAGNOSIS — E119 Type 2 diabetes mellitus without complications: Secondary | ICD-10-CM

## 2012-10-24 MED ORDER — ATORVASTATIN CALCIUM 40 MG PO TABS: 40.0000 mg | ORAL_TABLET | Freq: Every day | ORAL | Status: DC

## 2012-10-24 MED ORDER — HYDROCODONE-ACETAMINOPHEN 7.5-500 MG PO TABS: 1.0000 | ORAL_TABLET | Freq: Four times a day (QID) | ORAL | Status: DC | PRN

## 2012-10-24 MED ORDER — METFORMIN HCL 500 MG PO TABS: 500.0000 mg | ORAL_TABLET | Freq: Every day | ORAL | Status: DC

## 2012-10-24 MED ORDER — CITALOPRAM HYDROBROMIDE 20 MG PO TABS: 20.0000 mg | ORAL_TABLET | Freq: Every day | ORAL | Status: DC

## 2012-10-24 MED ORDER — ZOLPIDEM TARTRATE 5 MG PO TABS: 5.0000 mg | ORAL_TABLET | Freq: Every evening | ORAL | Status: DC | PRN

## 2012-10-24 MED ORDER — LISINOPRIL-HYDROCHLOROTHIAZIDE 20-25 MG PO TABS: 1.0000 | ORAL_TABLET | Freq: Every day | ORAL | Status: DC

## 2012-11-01 ENCOUNTER — Telehealth: Payer: Self-pay | Admitting: Family Medicine

## 2012-11-01 MED ORDER — GLUCOSE BLOOD VI STRP: 1.0000 | ORAL_STRIP | Freq: Two times a day (BID) | Status: DC

## 2012-11-01 NOTE — Telephone Encounter (Signed)
New Rx sent.

## 2012-11-01 NOTE — Telephone Encounter (Signed)
Patient called stating that she need an rx for test strips for her one touch verio iq glucose meter used to test twice a day sent to [facility] on [address] for 90 day refill. Please assist.

## 2012-11-05 ENCOUNTER — Other Ambulatory Visit: Payer: Self-pay | Admitting: *Deleted

## 2012-11-07 ENCOUNTER — Telehealth: Payer: Self-pay | Admitting: Family Medicine

## 2012-11-07 NOTE — Telephone Encounter (Signed)
Spoke with pharmacist and both prescriptions and patient is aware.

## 2012-11-07 NOTE — Telephone Encounter (Signed)
Rx written 12/19 for HYDROcodone-acetaminophen (LORTAB 7.5) 7.5-500 MG per tablet. Drug no longer avail. Need alternative w/no more than 325mg  of acetaminophen. Also, pt was expecting rx for generic Ambien, but [facility] does not have. Please advise.

## 2013-01-31 ENCOUNTER — Telehealth: Payer: Self-pay | Admitting: Family Medicine

## 2013-01-31 NOTE — Telephone Encounter (Signed)
Pt called and stated that her insurance will not fill her zolpidem (AMBIEN) 5 MG tablet, without documentation stating that its necessary from Dr. Tawanna Cooler. They told her that it was not "safe" considering the guide lines that changed in 2014. She stated that her insurance company faxed the form over last night, and wanted to let you know that she is almost completely out of medication. Please assist.

## 2013-02-03 NOTE — Telephone Encounter (Signed)
noted 

## 2013-03-26 ENCOUNTER — Other Ambulatory Visit (INDEPENDENT_AMBULATORY_CARE_PROVIDER_SITE_OTHER): Payer: Medicare Other

## 2013-03-26 ENCOUNTER — Encounter: Payer: Self-pay | Admitting: Family Medicine

## 2013-03-26 DIAGNOSIS — E785 Hyperlipidemia, unspecified: Secondary | ICD-10-CM

## 2013-03-26 DIAGNOSIS — IMO0001 Reserved for inherently not codable concepts without codable children: Secondary | ICD-10-CM

## 2013-03-26 DIAGNOSIS — I10 Essential (primary) hypertension: Secondary | ICD-10-CM

## 2013-03-26 LAB — POCT URINALYSIS DIPSTICK
Bilirubin, UA: NEGATIVE
Glucose, UA: NEGATIVE
Ketones, UA: NEGATIVE

## 2013-03-26 LAB — CBC WITH DIFFERENTIAL/PLATELET
Basophils Absolute: 0.1 10*3/uL (ref 0.0–0.1)
Basophils Relative: 0.8 % (ref 0.0–3.0)
Eosinophils Absolute: 0.3 10*3/uL (ref 0.0–0.7)
Lymphocytes Relative: 34.7 % (ref 12.0–46.0)
MCHC: 34 g/dL (ref 30.0–36.0)
MCV: 88.4 fl (ref 78.0–100.0)
Monocytes Absolute: 0.8 10*3/uL (ref 0.1–1.0)
Neutrophils Relative %: 49.9 % (ref 43.0–77.0)
Platelets: 183 10*3/uL (ref 150.0–400.0)
RBC: 5.03 Mil/uL (ref 3.87–5.11)
WBC: 7.6 10*3/uL (ref 4.5–10.5)

## 2013-03-26 LAB — HEPATIC FUNCTION PANEL
ALT: 13 U/L (ref 0–35)
AST: 16 U/L (ref 0–37)
Albumin: 3.9 g/dL (ref 3.5–5.2)
Alkaline Phosphatase: 83 U/L (ref 39–117)
Bilirubin, Direct: 0.1 mg/dL (ref 0.0–0.3)
Total Bilirubin: 1 mg/dL (ref 0.3–1.2)
Total Protein: 6.5 g/dL (ref 6.0–8.3)

## 2013-03-26 LAB — BASIC METABOLIC PANEL WITH GFR
BUN: 27 mg/dL — ABNORMAL HIGH (ref 6–23)
CO2: 28 meq/L (ref 19–32)
Calcium: 9.5 mg/dL (ref 8.4–10.5)
Chloride: 101 meq/L (ref 96–112)
Creatinine, Ser: 1.1 mg/dL (ref 0.4–1.2)
GFR: 52.69 mL/min — ABNORMAL LOW
Glucose, Bld: 104 mg/dL — ABNORMAL HIGH (ref 70–99)
Potassium: 4.2 meq/L (ref 3.5–5.1)
Sodium: 137 meq/L (ref 135–145)

## 2013-03-26 LAB — MICROALBUMIN / CREATININE URINE RATIO
Microalb Creat Ratio: 1.7 mg/g (ref 0.0–30.0)
Microalb, Ur: 1.4 mg/dL (ref 0.0–1.9)

## 2013-03-26 LAB — LIPID PANEL
Cholesterol: 171 mg/dL (ref 0–200)
LDL Cholesterol: 87 mg/dL (ref 0–99)

## 2013-04-02 ENCOUNTER — Ambulatory Visit (INDEPENDENT_AMBULATORY_CARE_PROVIDER_SITE_OTHER): Payer: Medicare Other | Admitting: Family Medicine

## 2013-04-02 ENCOUNTER — Ambulatory Visit (INDEPENDENT_AMBULATORY_CARE_PROVIDER_SITE_OTHER)
Admission: RE | Admit: 2013-04-02 | Discharge: 2013-04-02 | Disposition: A | Discharge status: Home / Self Care | Payer: Medicare Other | Source: Ambulatory Visit | Attending: Family Medicine | Admitting: Family Medicine

## 2013-04-02 ENCOUNTER — Encounter: Payer: Self-pay | Admitting: Family Medicine

## 2013-04-02 ENCOUNTER — Encounter: Payer: Medicare Other | Admitting: Family Medicine

## 2013-04-02 VITALS — BP 110/80 | Temp 98.6°F | Ht 68.0 in | Wt 210.0 lb

## 2013-04-02 DIAGNOSIS — G8929 Other chronic pain: Secondary | ICD-10-CM

## 2013-04-02 DIAGNOSIS — M25569 Pain in unspecified knee: Secondary | ICD-10-CM

## 2013-04-02 DIAGNOSIS — J45909 Unspecified asthma, uncomplicated: Secondary | ICD-10-CM

## 2013-04-02 DIAGNOSIS — Z860100 Personal history of colon polyps, unspecified: Secondary | ICD-10-CM

## 2013-04-02 DIAGNOSIS — I10 Essential (primary) hypertension: Secondary | ICD-10-CM

## 2013-04-02 DIAGNOSIS — R911 Solitary pulmonary nodule: Secondary | ICD-10-CM

## 2013-04-02 DIAGNOSIS — E119 Type 2 diabetes mellitus without complications: Secondary | ICD-10-CM

## 2013-04-02 DIAGNOSIS — E785 Hyperlipidemia, unspecified: Secondary | ICD-10-CM

## 2013-04-02 DIAGNOSIS — F3289 Other specified depressive episodes: Secondary | ICD-10-CM

## 2013-04-02 DIAGNOSIS — F329 Major depressive disorder, single episode, unspecified: Secondary | ICD-10-CM

## 2013-04-02 DIAGNOSIS — H3530 Unspecified macular degeneration: Secondary | ICD-10-CM

## 2013-04-02 DIAGNOSIS — M51379 Other intervertebral disc degeneration, lumbosacral region without mention of lumbar back pain or lower extremity pain: Secondary | ICD-10-CM

## 2013-04-02 DIAGNOSIS — M5137 Other intervertebral disc degeneration, lumbosacral region: Secondary | ICD-10-CM

## 2013-04-02 DIAGNOSIS — J4489 Other specified chronic obstructive pulmonary disease: Secondary | ICD-10-CM

## 2013-04-02 DIAGNOSIS — J449 Chronic obstructive pulmonary disease, unspecified: Secondary | ICD-10-CM

## 2013-04-02 DIAGNOSIS — Z23 Encounter for immunization: Secondary | ICD-10-CM

## 2013-04-02 MED ORDER — ZOLPIDEM TARTRATE 5 MG PO TABS: 5.0000 mg | ORAL_TABLET | Freq: Every evening | ORAL | Status: DC | PRN

## 2013-04-02 MED ORDER — CITALOPRAM HYDROBROMIDE 20 MG PO TABS: 20.0000 mg | ORAL_TABLET | Freq: Every day | ORAL | Status: DC

## 2013-04-02 MED ORDER — HYDROCODONE-ACETAMINOPHEN 7.5-325 MG PO TABS: 1.0000 | ORAL_TABLET | Freq: Four times a day (QID) | ORAL | Status: DC | PRN

## 2013-04-02 MED ORDER — ATORVASTATIN CALCIUM 40 MG PO TABS: 40.0000 mg | ORAL_TABLET | Freq: Every day | ORAL | Status: DC

## 2013-04-02 MED ORDER — TRAMADOL HCL 50 MG PO TABS: 50.0000 mg | ORAL_TABLET | Freq: Three times a day (TID) | ORAL | Status: DC | PRN

## 2013-04-02 MED ORDER — METFORMIN HCL 500 MG PO TABS: 500.0000 mg | ORAL_TABLET | Freq: Every day | ORAL | Status: DC

## 2013-04-02 MED ORDER — LISINOPRIL-HYDROCHLOROTHIAZIDE 20-25 MG PO TABS: 1.0000 | ORAL_TABLET | Freq: Every day | ORAL | Status: DC

## 2013-04-02 NOTE — Patient Instructions (Signed)
Continue your current medications  Remember to do a thorough breast exam monthly  If you start experiencing episodes of lightheadedness when you stand up cut your Zestoretic in half  Return in 6 months for followup  Nonfasting labs one week prior  Followup chest x-ray as outlined

## 2013-04-02 NOTE — Progress Notes (Signed)
Subjective:    Patient ID: Lynn [name], female    DOB: 10-19-1946, 67 y.o.   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 She takes Ambien when necessary for sleep  She gets routine eye care, dental care, gets annual mammography but does not do breast exam monthly. Her mother had breast cancer in her late 46s. Advised to do BSE monthly. Colonoscopy 5 years normal she is followed every 5 years because she has a history of colon polyps. She's due for a followup chest x-ray because she had an lesion that Dr. Cyndra Numbers followed for many years that did not increase in size.. 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  Vaccinations up-to-date except she needs a Pneumovax.  Cognitive function normal she goes to walk almost every day for water aerobics, home health safety reviewed no issues identified, no guns in the house, she does have a health care power of attorney and living will   Review of Systems  Constitutional: Negative.   HENT: Negative.   Eyes: Negative.   Respiratory: Negative.   Cardiovascular: Negative.   Gastrointestinal: Negative.   Genitourinary: Negative.   Musculoskeletal: Negative.   Neurological: Negative.   Psychiatric/Behavioral: Negative.        Objective:   Physical Exam  Constitutional: She appears well-developed and well-nourished.  HENT:  Head: Normocephalic and atraumatic.  Right Ear: External ear normal.  Left Ear: External ear normal.   Nose: Nose normal.  Mouth/Throat: Oropharynx is clear and moist.  Eyes: EOM are normal. Pupils are equal, round, and reactive to light.  Neck: Normal range of motion. Neck supple. No thyromegaly present.  Cardiovascular: Normal rate, regular rhythm, normal heart sounds and intact distal pulses.  Exam reveals no gallop and no friction rub.   No murmur heard. No carotid or aortic bruits peripheral pulses normal  Pulmonary/Chest: Effort normal and breath sounds normal.  Abdominal: Soft. Bowel sounds are normal. She exhibits no distension and no mass. There is no tenderness. There is no rebound.  Genitourinary:  Bilateral breast exam normal BSE was again thought  Musculoskeletal: Normal range of motion.  None neuropathy  Lymphadenopathy:    She has no cervical adenopathy.  Neurological: She is alert. She has normal reflexes. No cranial nerve deficit. She exhibits normal muscle tone. Coordination normal.  Skin: Skin is warm and dry.  Total body skin exam normal except for scar right knee from previous knee replacement  Psychiatric: She has a normal mood and affect. Her behavior is normal. Judgment and thought content normal.          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

## 2013-04-19 ENCOUNTER — Other Ambulatory Visit: Payer: Self-pay | Admitting: Family Medicine

## 2013-04-21 ENCOUNTER — Other Ambulatory Visit: Payer: Self-pay | Admitting: Family Medicine

## 2013-07-08 ENCOUNTER — Other Ambulatory Visit: Payer: Self-pay | Admitting: Family Medicine

## 2013-07-29 ENCOUNTER — Ambulatory Visit (INDEPENDENT_AMBULATORY_CARE_PROVIDER_SITE_OTHER): Payer: Medicare Other

## 2013-07-29 DIAGNOSIS — Z23 Encounter for immunization: Secondary | ICD-10-CM

## 2013-09-26 ENCOUNTER — Encounter: Payer: Self-pay | Admitting: Family Medicine

## 2013-09-29 ENCOUNTER — Other Ambulatory Visit (INDEPENDENT_AMBULATORY_CARE_PROVIDER_SITE_OTHER): Payer: Medicare Other

## 2013-09-29 ENCOUNTER — Other Ambulatory Visit: Payer: Self-pay | Admitting: *Deleted

## 2013-09-29 DIAGNOSIS — E119 Type 2 diabetes mellitus without complications: Secondary | ICD-10-CM

## 2013-09-29 DIAGNOSIS — G8929 Other chronic pain: Secondary | ICD-10-CM

## 2013-09-29 LAB — BASIC METABOLIC PANEL
Calcium: 9.8 mg/dL (ref 8.4–10.5)
GFR: 71.81 mL/min (ref >60.00)
Glucose, Bld: 114 mg/dL — ABNORMAL HIGH (ref 70–99)
Sodium: 137 mEq/L (ref 135–145)

## 2013-09-29 MED ORDER — TRAMADOL HCL 50 MG PO TABS: 50.0000 mg | ORAL_TABLET | Freq: Three times a day (TID) | ORAL | Status: DC | PRN

## 2013-10-06 ENCOUNTER — Ambulatory Visit: Payer: Medicare Other | Admitting: Family Medicine

## 2013-10-09 ENCOUNTER — Ambulatory Visit: Payer: Medicare Other | Admitting: Family Medicine

## 2013-10-13 ENCOUNTER — Ambulatory Visit (INDEPENDENT_AMBULATORY_CARE_PROVIDER_SITE_OTHER): Payer: Medicare Other | Admitting: Family Medicine

## 2013-10-13 ENCOUNTER — Ambulatory Visit: Payer: Medicare Other | Admitting: Family Medicine

## 2013-10-13 ENCOUNTER — Encounter: Payer: Self-pay | Admitting: Family Medicine

## 2013-10-13 VITALS — BP 120/88 | Temp 98.1°F | Wt 211.0 lb

## 2013-10-13 DIAGNOSIS — J309 Allergic rhinitis, unspecified: Secondary | ICD-10-CM

## 2013-10-13 DIAGNOSIS — E785 Hyperlipidemia, unspecified: Secondary | ICD-10-CM

## 2013-10-13 DIAGNOSIS — E119 Type 2 diabetes mellitus without complications: Secondary | ICD-10-CM

## 2013-10-13 DIAGNOSIS — I10 Essential (primary) hypertension: Secondary | ICD-10-CM

## 2013-10-13 MED ORDER — FLUTICASONE PROPIONATE 50 MCG/ACT NA SUSP: NASAL | Status: DC

## 2013-10-13 NOTE — Progress Notes (Signed)
Pre visit review using our clinic review tool, if applicable. No additional management support is needed unless otherwise documented below in the visit note. 

## 2013-10-13 NOTE — Progress Notes (Signed)
   Subjective:    Patient ID: Lynn [name], female    DOB: 07-19-1946, 67 y.o.   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 It was 104 but [facility].  We decreased her antihypertensive medicines Zestoretic 20-25 to one half tab daily because her blood pressure was too low BP now 120/88  She also has allergic rhinitis and wants to know what she should take  Review of Systems    review of systems negative Objective:   Physical Exam  Well-developed well-nourished female no acute distress vital signs stable she is afebrile      Assessment & Plan:  Diabetes type 2 at goal continue current therapy  Allergic rhinitis plain Zyrtec each bedtime along with steroid nasal spray

## 2013-10-13 NOTE — Patient Instructions (Signed)
Zyrtec plain 10 mg,,,,,,,,,,,, one tablet at bedtime  Steroid nasal spray........Marland Kitchen 1 spray up each nostril at bedtime   continue your current medications

## 2013-10-22 ENCOUNTER — Ambulatory Visit (INDEPENDENT_AMBULATORY_CARE_PROVIDER_SITE_OTHER): Payer: Medicare Other | Admitting: Podiatry

## 2013-10-22 ENCOUNTER — Encounter: Payer: Self-pay | Admitting: Podiatry

## 2013-10-22 ENCOUNTER — Encounter: Payer: Self-pay | Admitting: Family Medicine

## 2013-10-22 VITALS — BP 128/69 | HR 86 | Resp 12

## 2013-10-22 DIAGNOSIS — L84 Corns and callosities: Secondary | ICD-10-CM

## 2013-10-23 NOTE — Progress Notes (Signed)
Subjective:     Patient ID: Lynn [name], female   DOB: 09-06-1946, 67 y.o.   MRN: [id number]  HPI patient presents with plantar calluses fifth metatarsal both feet which are increasingly sore   Review of Systems     Objective:   Physical Exam Neurovascular status intact with significant keratotic lesion sub-fifth metatarsal heads left over right    Assessment:     Chronic lesions which are getting worse as time goes on    Plan:     Discuss possibility for fifth metatarsal head resection and today debrided lesions on both feet with discussion in January of out surgery

## 2013-10-27 ENCOUNTER — Other Ambulatory Visit: Payer: Self-pay | Admitting: *Deleted

## 2013-10-27 DIAGNOSIS — E119 Type 2 diabetes mellitus without complications: Secondary | ICD-10-CM

## 2013-10-27 DIAGNOSIS — E785 Hyperlipidemia, unspecified: Secondary | ICD-10-CM

## 2013-10-27 DIAGNOSIS — F329 Major depressive disorder, single episode, unspecified: Secondary | ICD-10-CM

## 2013-10-27 DIAGNOSIS — G8929 Other chronic pain: Secondary | ICD-10-CM

## 2013-10-27 DIAGNOSIS — F3289 Other specified depressive episodes: Secondary | ICD-10-CM

## 2013-10-27 DIAGNOSIS — J309 Allergic rhinitis, unspecified: Secondary | ICD-10-CM

## 2013-10-27 MED ORDER — ATORVASTATIN CALCIUM 40 MG PO TABS: 40.0000 mg | ORAL_TABLET | Freq: Every day | ORAL | Status: DC

## 2013-10-27 MED ORDER — LISINOPRIL-HYDROCHLOROTHIAZIDE 20-25 MG PO TABS: ORAL_TABLET | ORAL | Status: DC

## 2013-10-27 MED ORDER — ZOLPIDEM TARTRATE 5 MG PO TABS: ORAL_TABLET | ORAL | Status: DC

## 2013-10-27 MED ORDER — HYDROCODONE-ACETAMINOPHEN 7.5-325 MG PO TABS: 1.0000 | ORAL_TABLET | Freq: Four times a day (QID) | ORAL | Status: DC | PRN

## 2013-10-27 MED ORDER — METFORMIN HCL 500 MG PO TABS: 500.0000 mg | ORAL_TABLET | Freq: Every day | ORAL | Status: DC

## 2013-10-27 MED ORDER — FLUTICASONE PROPIONATE 50 MCG/ACT NA SUSP: NASAL | Status: DC

## 2013-10-27 MED ORDER — CITALOPRAM HYDROBROMIDE 20 MG PO TABS: 20.0000 mg | ORAL_TABLET | Freq: Every day | ORAL | Status: DC

## 2013-10-27 MED ORDER — TRAMADOL HCL 50 MG PO TABS: 50.0000 mg | ORAL_TABLET | Freq: Three times a day (TID) | ORAL | Status: DC | PRN

## 2013-10-31 ENCOUNTER — Ambulatory Visit: Payer: Medicare Other

## 2013-10-31 ENCOUNTER — Ambulatory Visit (INDEPENDENT_AMBULATORY_CARE_PROVIDER_SITE_OTHER): Payer: Medicare Other | Admitting: Internal Medicine

## 2013-10-31 VITALS — BP 118/72 | HR 74 | Temp 98.7°F | Resp 16 | Ht 68.5 in | Wt 218.8 lb

## 2013-10-31 DIAGNOSIS — J45909 Unspecified asthma, uncomplicated: Secondary | ICD-10-CM

## 2013-10-31 DIAGNOSIS — R509 Fever, unspecified: Secondary | ICD-10-CM

## 2013-10-31 DIAGNOSIS — R0602 Shortness of breath: Secondary | ICD-10-CM

## 2013-10-31 MED ORDER — DOXYCYCLINE HYCLATE 100 MG PO TABS: 100.0000 mg | ORAL_TABLET | Freq: Two times a day (BID) | ORAL | Status: DC

## 2013-10-31 NOTE — Patient Instructions (Signed)
Continue prednisone 40 today, then 30 for 3 d, 20 for 3 d, and 10 for 3 days Doxycycline albuterol

## 2013-10-31 NOTE — Progress Notes (Signed)
Subjective:    Patient ID: Lynn [name], female    DOB: 1946/07/16, 67 y.o.   MRN: [id number]  HPIST/chest tightness 1 week--feels sob-doe Nasal d/c better--ST gone Took 40 pred 40mg  daily starting tues-3 d ago and only a little better Very tight but not much cough-nonprod Had to sleep sitting up Chills/?low grd fever   Recent ck Dr Tawanna Cooler DM--started nasal steroids Smoked 37 yrs Past pneum 90s Hx rec bronchitis Dx w/ asthma in past   Patient Active Problem List   Diagnosis Date Noted  . Allergic rhinitis 10/13/2013  . SEBORRHEIC KERATOSIS, INFLAMED 12/02/2010  . HYPERLIPIDEMIA 06/21/2010  . UNSPECIFIED HEARING LOSS 01/13/2010  . DISC DISEASE, LUMBAR 05/25/2009  . DIVERTICULOSIS OF COLON 01/11/2009  . 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 DIARRHEA 05/31/2007  . DIABETES MELLITUS, TYPE II 03/14/2007  . DEPRESSION 03/14/2007  . DEGENERATION, MACULAR NOS 03/14/2007  . HYPERTENSION 03/14/2007  . ASTHMA 03/14/2007  . COPD 03/14/2007  Current outpatient prescriptions:aspirin 81 MG tablet, Take 81 mg by mouth daily.  , Disp: , Rfl: ;  atorvastatin (LIPITOR) 40 MG tablet, Take 1 tablet (40 mg total) by mouth daily., Disp: 100 tablet, Rfl: 3;  citalopram (CELEXA) 20 MG tablet, Take 5 mg by mouth daily., Disp: , Rfl: ;  docusate sodium (COLACE) 100 MG capsule, Take 100 mg by mouth daily as needed. For stool softner, Disp: , Rfl:  glucose blood test strip, 1 each by Other route 2 (two) times daily. One touch verio Dx 250.00, Disp: 200 each, Rfl: 3;  lisinopril-hydrochlorothiazide (PRINZIDE,ZESTORETIC) 20-25 MG per tablet, 1 tablet. Half tab daily, Disp: , Rfl: ;  metFORMIN (GLUCOPHAGE) 500 MG tablet, Take 1 tablet (500 mg total) by mouth daily with breakfast., Disp: 100 tablet, Rfl: 3;  Multiple Vitamins-Minerals (OCUVITE PO), Take 1 tablet by mouth daily. , Disp: , Rfl:  traMADol (ULTRAM) 50 MG tablet, Take 1 tablet (50 mg total) by mouth every 8 (eight) hours as needed.,  Disp: 30 tablet, Rfl: 3;  zolpidem (AMBIEN) 5 MG tablet, TAKE ONE TABLET BY MOUTH AT BEDTIME AS NEEDED, Disp: 90 tablet, Rfl: 1;  albuterol (PROVENTIL) (2.5 MG/3ML) 0.083% nebulizer solution, Take 3 mLs (2.5 mg total) by nebulization every 6 (six) hours as needed for wheezing., Disp: 3 mL, Rfl: 11 doxycycline (VIBRA-TABS) 100 MG tablet, Take 1 tablet (100 mg total) by mouth 2 (two) times daily., Disp: 20 tablet, Rfl: 0;  fluticasone (FLONASE) 50 MCG/ACT nasal spray, 1 spray up each nostril  at bedtime, Disp: 16 g, Rfl: 6;  HYDROcodone-acetaminophen (NORCO) 7.5-325 MG per tablet, Take 1 tablet by mouth every 6 (six) hours as needed., Disp: 60 tablet, Rfl: 0  Last A1C 6.6 09/2013 Review of Systems No GI sxtoms   no GU symptoms Objective:   Physical Exam BP 118/72  Pulse 74  Temp(Src) 98.7 F (37.1 C) (Oral)  Resp 16  Ht 5' 8.5" (1.74 m)  Wt 218 lb 12.8 oz (99.247 kg)  BMI 32.78 kg/m2  SpO2 98% Looks miserable Conjunctiva clear Nares boggy Throat clear No cervical adenopathy Lungs with decreased breath sounds over both bases No wheezing or rales//bronchitis bilaterally that clears with coughing Heart regular without murmur  [facility] reading (PRIMARY) by  Dr. Josephina Gip consolidation/stable L sided pulm nodule(CT 2011)/?R hilar node with post obstructive streaking versus underpenetration      Assessment & Plan:  Fever, unspecified - Plan: DG Chest 2 View  SOB (shortness of breath) - Plan: DG Chest 2 View  RAD (reactive airway disease)  History of frequent bronchitis which usually responds to doxycycline  Meds ordered this encounter  Medications  . doxycycline (VIBRA-TABS) 100 MG tablet    Sig: Take 1 tablet (100 mg total) by mouth 2 (two) times daily.    Dispense:  20 tablet    Refill:  0  Continue other med Continue prednisone with declining dose - another 7 days

## 2014-01-02 ENCOUNTER — Encounter: Payer: Self-pay | Admitting: *Deleted

## 2014-01-16 ENCOUNTER — Encounter: Payer: Self-pay | Admitting: Internal Medicine

## 2014-03-19 ENCOUNTER — Ambulatory Visit (INDEPENDENT_AMBULATORY_CARE_PROVIDER_SITE_OTHER): Payer: Medicare Other | Admitting: Family Medicine

## 2014-03-19 VITALS — BP 142/74 | HR 94 | Temp 99.0°F | Resp 18 | Ht 68.5 in | Wt 215.0 lb

## 2014-03-19 DIAGNOSIS — H00033 Abscess of eyelid right eye, unspecified eyelid: Secondary | ICD-10-CM

## 2014-03-19 DIAGNOSIS — H00039 Abscess of eyelid unspecified eye, unspecified eyelid: Secondary | ICD-10-CM

## 2014-03-19 DIAGNOSIS — H00019 Hordeolum externum unspecified eye, unspecified eyelid: Secondary | ICD-10-CM

## 2014-03-19 MED ORDER — AMOXICILLIN-POT CLAVULANATE 875-125 MG PO TABS: 1.0000 | ORAL_TABLET | Freq: Two times a day (BID) | ORAL | Status: DC

## 2014-03-19 MED ORDER — ERYTHROMYCIN 5 MG/GM OP OINT: 1.0000 "application " | TOPICAL_OINTMENT | Freq: Four times a day (QID) | OPHTHALMIC | Status: DC

## 2014-03-19 NOTE — Patient Instructions (Signed)
Use the amoxicillin as directed for your eye; twice a day for 10 days.  Also, use the erythromycin ointment 3 or 4 times a day.    Let me know if you do not get better in the next few days- Sooner if worse.   You can go back to your contacts once your symptoms are resolved.

## 2014-03-19 NOTE — Progress Notes (Signed)
[facility] [address] [place] [address] [phone]  Date:  03/19/2014   Name:  Lynn [name]   DOB:  January 29, 1946   MRN:  [id number]  PCP:  Joycelyn Man, MD    Chief Complaint: Stye   History of Present Illness:  Lynn [name] is a 68 y.o. very pleasant female patient who presents with the following:  She is here today with a likely stye on her right lower lid; persistent for about one week. She used hot compresses, and some neosporin in a qtip. The area has not drained yet.  She is trying to wear her left contact only and her glasses.    She has been outdoors recently working, and may have gotten some dirt in her eye. Wonders if this precipitated the stye She has called her eye doctor but cannot be seen there until Monday.   Generally she wears contacts but does have glasses to wear as needed.  Her vision seems to be ok; she does not feel it is different than usual She is tender in the lower lid, and has tried to squeeze the area.  She has not noted a FB sensation, no pain in the globe  Patient Active Problem List   Diagnosis Date Noted  . Allergic rhinitis 10/13/2013  . SEBORRHEIC KERATOSIS, INFLAMED 12/02/2010  . HYPERLIPIDEMIA 06/21/2010  . UNSPECIFIED HEARING LOSS 01/13/2010  . [place] DISEASE, LUMBAR 05/25/2009  . DIVERTICULOSIS OF COLON 01/11/2009  . 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 DIARRHEA 05/31/2007  . DIABETES MELLITUS, TYPE II 03/14/2007  . DEPRESSION 03/14/2007  . DEGENERATION, MACULAR NOS 03/14/2007  . HYPERTENSION 03/14/2007  . ASTHMA 03/14/2007  . COPD 03/14/2007    Past Medical History  Diagnosis Date  . DIABETES MELLITUS, TYPE II 03/14/2007  . HYPERLIPIDEMIA 06/21/2010  . DEGENERATION, MACULAR NOS 03/14/2007  . Unspecified hearing loss 01/13/2010  . HYPERTENSION 03/14/2007  . DIVERTICULOSIS OF COLON 01/11/2009  . [place] DISEASE, LUMBAR 05/25/2009  . Diarrhea 05/31/2007  . COLONIC POLYPS, ADENOMATOUS, HX  OF 01/11/2009  . VERTIGO 11/10/2010  . SEBORRHEIC KERATOSIS, INFLAMED 12/02/2010  . ASTHMA 03/14/2007    NO PROBELEM IN 12 YRS  . DEPRESSION 03/14/2007  . Lung abnormality     LT LUNG WITH SPOT HAS BEEN FOLLOWED X 3 YRS DR Arlyce Dice  . Blood dyscrasia     VON WILLIBRANDE "FREE BLEEDER"    Past Surgical History  Procedure Laterality Date  . Cholecystectomy    . Tonsillectomy    . Spine surgery      fusion, ruptured disk  . Knee arthroscopy      right, TNR  . Joint replacement      1998 RT KNEE  +2009 REMOVED, REPLACED JOINT  . Left shoulder      History  Substance Use Topics  . Smoking status: Former Smoker -- 2.00 packs/day for 37 years    Types: Cigarettes    Quit date: 09/19/1999  . Smokeless tobacco: Not on file  . Alcohol Use: Yes    Family History  Problem Relation Age of Onset  . Heart disease Other   . Diabetes Other     diabeties  . Hyperlipidemia Other   . Hypertension Other   . Cancer Mother   . Diabetes Mother   . Heart disease Mother   . Hyperlipidemia Mother   . Hypertension Mother   . Hyperlipidemia Father   . Hypertension Father     Allergies  Allergen Reactions  . Morphine And Related Itching    Medication list has been reviewed and updated.  Current Outpatient Prescriptions on File Prior to Visit  Medication Sig Dispense Refill  . aspirin 81 MG tablet Take 81 mg by mouth daily.        Marland Kitchen atorvastatin (LIPITOR) 40 MG tablet Take 1 tablet (40 mg total) by mouth daily.  100 tablet  3  . citalopram (CELEXA) 20 MG tablet Take 5 mg by mouth daily.      Marland Kitchen docusate sodium (COLACE) 100 MG capsule Take 100 mg by mouth daily as needed. For stool softner      . fluticasone (FLONASE) 50 MCG/ACT nasal spray 1 spray up each nostril  at bedtime  16 g  6  . glucose blood test strip 1 each by Other route 2 (two) times daily. One touch verio Dx 250.00  200 each  3  . HYDROcodone-acetaminophen (NORCO) 7.5-325 MG per tablet Take 1 tablet by mouth every 6 (six) hours  as needed.  60 tablet  0  . lisinopril-hydrochlorothiazide (PRINZIDE,ZESTORETIC) 20-25 MG per tablet 1 tablet. Half tab daily      . metFORMIN (GLUCOPHAGE) 500 MG tablet Take 1 tablet (500 mg total) by mouth daily with breakfast.  100 tablet  3  . Multiple Vitamins-Minerals (OCUVITE PO) Take 1 tablet by mouth daily.       Marland Kitchen zolpidem (AMBIEN) 5 MG tablet TAKE ONE TABLET BY MOUTH AT BEDTIME AS NEEDED  90 tablet  1  . albuterol (PROVENTIL) (2.5 MG/3ML) 0.083% nebulizer solution Take 3 mLs (2.5 mg total) by nebulization every 6 (six) hours as needed for wheezing.  3 mL  11   No current facility-administered medications on file prior to visit.    Review of Systems:  As per HPI- otherwise negative.   Physical Examination: Filed Vitals:   03/19/14 1210  BP: 142/74  Pulse: 94  Temp: 99 F (37.2 C)  Resp: 18   Filed Vitals:   03/19/14 1210  Height: 5' 8.5" (1.74 m)  Weight: 215 lb (97.523 kg)   Body mass index is 32.21 kg/(m^2). Ideal Body Weight: Weight in (lb) to have BMI = 25: 166.5  GEN: WDWN, NAD, Non-toxic, A & O x 3, obese, looks well HEENT: Atraumatic, Normocephalic. Neck supple. No masses, No LAD.  Bilateral TM wnl, oropharynx normal.  PEERL,EOMI.   Ears and Nose: No external deformity. CV: RRR, No M/G/R. No JVD. No thrill. No extra heart sounds. PULM: CTA B, no wheezes, crackles, rhonchi. No retractions. No resp. distress. No accessory muscle use. ABD: S, NT, ND, +BS. No rebound. No HSM. EXTR: No c/c/e NEURO Normal gait.  PSYCH: Normally interactive. Conversant. Not depressed or anxious appearing.  Calm demeanor.  Eyes:  PEERL, EOMI.  She has a small nodule consistent with a stye on the right lower lid. There is mild redness of the skin underneath the eye as well.   Negative fluorescin stain  Assessment and Plan: Hordeolum externum (stye) - Plan: erythromycin ophthalmic ointment  Cellulitis of right eyelid - Plan: amoxicillin-clavulanate (AUGMENTIN) 875-125 MG per  tablet  Treat for stye and some mild cellulitis around the eye with erythromycin opthalmic ointment and augmentin.    Signed Lamar Blinks, MD

## 2014-03-23 ENCOUNTER — Other Ambulatory Visit: Payer: Medicare Other

## 2014-03-24 ENCOUNTER — Other Ambulatory Visit (INDEPENDENT_AMBULATORY_CARE_PROVIDER_SITE_OTHER): Payer: Medicare Other

## 2014-03-24 DIAGNOSIS — E785 Hyperlipidemia, unspecified: Secondary | ICD-10-CM

## 2014-03-24 DIAGNOSIS — E119 Type 2 diabetes mellitus without complications: Secondary | ICD-10-CM

## 2014-03-24 DIAGNOSIS — I10 Essential (primary) hypertension: Secondary | ICD-10-CM

## 2014-03-24 LAB — MICROALBUMIN / CREATININE URINE RATIO
Creatinine,U: 53.9 mg/dL
MICROALB/CREAT RATIO: 0.7 mg/g (ref 0.0–30.0)
Microalb, Ur: 0.4 mg/dL (ref 0.0–1.9)

## 2014-03-24 LAB — CBC WITH DIFFERENTIAL/PLATELET
Basophils Absolute: 0 10*3/uL (ref 0.0–0.1)
Basophils Relative: 0.6 % (ref 0.0–3.0)
Eosinophils Absolute: 0.2 10*3/uL (ref 0.0–0.7)
Eosinophils Relative: 3.4 % (ref 0.0–5.0)
HCT: 45 % (ref 36.0–46.0)
HEMOGLOBIN: 14.9 g/dL (ref 12.0–15.0)
LYMPHS ABS: 2.2 10*3/uL (ref 0.7–4.0)
Lymphocytes Relative: 33.9 % (ref 12.0–46.0)
MCHC: 33.2 g/dL (ref 30.0–36.0)
MCV: 91.2 fl (ref 78.0–100.0)
MONOS PCT: 7.6 % (ref 3.0–12.0)
Monocytes Absolute: 0.5 10*3/uL (ref 0.1–1.0)
NEUTROS ABS: 3.6 10*3/uL (ref 1.4–7.7)
Neutrophils Relative %: 54.5 % (ref 43.0–77.0)
Platelets: 192 10*3/uL (ref 150.0–400.0)
RBC: 4.93 Mil/uL (ref 3.87–5.11)
RDW: 14 % (ref 11.5–15.5)
WBC: 6.6 10*3/uL (ref 4.0–10.5)

## 2014-03-24 LAB — POCT URINALYSIS DIPSTICK
BILIRUBIN UA: NEGATIVE
Glucose, UA: NEGATIVE
Ketones, UA: NEGATIVE
Leukocytes, UA: NEGATIVE
Nitrite, UA: NEGATIVE
Protein, UA: NEGATIVE
RBC UA: NEGATIVE
Spec Grav, UA: 1.01
Urobilinogen, UA: 0.2
pH, UA: 5.5

## 2014-03-24 LAB — HEPATIC FUNCTION PANEL
ALBUMIN: 3.9 g/dL (ref 3.5–5.2)
ALT: 11 U/L (ref 0–35)
AST: 16 U/L (ref 0–37)
Alkaline Phosphatase: 91 U/L (ref 39–117)
Bilirubin, Direct: 0 mg/dL (ref 0.0–0.3)
Total Bilirubin: 0.8 mg/dL (ref 0.2–1.2)
Total Protein: 6.5 g/dL (ref 6.0–8.3)

## 2014-03-24 LAB — LIPID PANEL
Cholesterol: 171 mg/dL (ref 0–200)
HDL: 63.4 mg/dL (ref >39.00)
LDL Cholesterol: 87 mg/dL (ref 0–99)
TRIGLYCERIDES: 105 mg/dL (ref 0.0–149.0)
Total CHOL/HDL Ratio: 3
VLDL: 21 mg/dL (ref 0.0–40.0)

## 2014-03-24 LAB — TSH: TSH: 1.04 u[IU]/mL (ref 0.35–4.50)

## 2014-03-24 LAB — BASIC METABOLIC PANEL
BUN: 15 mg/dL (ref 6–23)
CO2: 30 mEq/L (ref 19–32)
CREATININE: 0.8 mg/dL (ref 0.4–1.2)
Calcium: 9.8 mg/dL (ref 8.4–10.5)
Chloride: 104 mEq/L (ref 96–112)
GFR: 73.73 mL/min (ref >60.00)
GLUCOSE: 118 mg/dL — AB (ref 70–99)
POTASSIUM: 4.8 meq/L (ref 3.5–5.1)
Sodium: 140 mEq/L (ref 135–145)

## 2014-03-24 LAB — HEMOGLOBIN A1C: Hgb A1c MFr Bld: 7 % — ABNORMAL HIGH (ref 4.6–6.5)

## 2014-04-06 ENCOUNTER — Ambulatory Visit (INDEPENDENT_AMBULATORY_CARE_PROVIDER_SITE_OTHER): Payer: Medicare Other | Admitting: Family Medicine

## 2014-04-06 ENCOUNTER — Encounter: Payer: Self-pay | Admitting: Family Medicine

## 2014-04-06 VITALS — BP 120/70 | HR 68 | Temp 98.9°F | Wt 210.0 lb

## 2014-04-06 DIAGNOSIS — F3289 Other specified depressive episodes: Secondary | ICD-10-CM

## 2014-04-06 DIAGNOSIS — H3530 Unspecified macular degeneration: Secondary | ICD-10-CM

## 2014-04-06 DIAGNOSIS — E785 Hyperlipidemia, unspecified: Secondary | ICD-10-CM

## 2014-04-06 DIAGNOSIS — M25569 Pain in unspecified knee: Secondary | ICD-10-CM

## 2014-04-06 DIAGNOSIS — Z23 Encounter for immunization: Secondary | ICD-10-CM

## 2014-04-06 DIAGNOSIS — H9190 Unspecified hearing loss, unspecified ear: Secondary | ICD-10-CM

## 2014-04-06 DIAGNOSIS — J45909 Unspecified asthma, uncomplicated: Secondary | ICD-10-CM

## 2014-04-06 DIAGNOSIS — I10 Essential (primary) hypertension: Secondary | ICD-10-CM

## 2014-04-06 DIAGNOSIS — J449 Chronic obstructive pulmonary disease, unspecified: Secondary | ICD-10-CM

## 2014-04-06 DIAGNOSIS — E119 Type 2 diabetes mellitus without complications: Secondary | ICD-10-CM

## 2014-04-06 DIAGNOSIS — G4700 Insomnia, unspecified: Secondary | ICD-10-CM

## 2014-04-06 DIAGNOSIS — J4489 Other specified chronic obstructive pulmonary disease: Secondary | ICD-10-CM

## 2014-04-06 DIAGNOSIS — F329 Major depressive disorder, single episode, unspecified: Secondary | ICD-10-CM

## 2014-04-06 MED ORDER — ZOLPIDEM TARTRATE 5 MG PO TABS: ORAL_TABLET | ORAL | Status: DC

## 2014-04-06 MED ORDER — METFORMIN HCL 500 MG PO TABS: 500.0000 mg | ORAL_TABLET | Freq: Every day | ORAL | Status: DC

## 2014-04-06 MED ORDER — TRAMADOL HCL 50 MG PO TABS: ORAL_TABLET | ORAL | Status: DC

## 2014-04-06 MED ORDER — ATORVASTATIN CALCIUM 40 MG PO TABS: 40.0000 mg | ORAL_TABLET | Freq: Every day | ORAL | Status: DC

## 2014-04-06 MED ORDER — LISINOPRIL-HYDROCHLOROTHIAZIDE 20-25 MG PO TABS: ORAL_TABLET | ORAL | Status: DC

## 2014-04-06 MED ORDER — CITALOPRAM HYDROBROMIDE 20 MG PO TABS: ORAL_TABLET | ORAL | Status: DC

## 2014-04-06 NOTE — Progress Notes (Signed)
   Subjective:    Patient ID: Lynn [name], female    DOB: [birth year], 68 y.o.   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 Wilburn Cornelia  She gets routine eye care, dental care, BSE monthly, and you mammography, colonoscopy or in the near future because of a history of colon polyps  Medication reviewed the been no changes. We talked about her pain. She has chronic foot pain from a previous fracture also has a bone spur in that foot. She's been injected by Dr. Jennell Corner. She would like to try the tramadol as opposed to the Vicodin.  She's had a right knee replaced she does water aerobics 3 times weekly  Vaccinations updated by Apolonio Schneiders  Cognitive function normal she does water aerobics home health safety reviewed no issues identified, no guns in the house, she does have a health care power of attorney and living will   Review of Systems  Constitutional: Negative.   HENT: Negative.   Eyes: Negative.   Respiratory: Negative.   Cardiovascular: Negative.   Gastrointestinal: Negative.   Genitourinary: Negative.   Musculoskeletal: Negative.   Neurological: Negative.   Psychiatric/Behavioral: Negative.        Objective:   Physical Exam  Nursing note and vitals reviewed. Constitutional: She appears well-developed and well-nourished. No distress.  HENT:  Head: Normocephalic and atraumatic.  Right Ear: External ear normal.  Left Ear: External ear normal.  Nose: Nose normal.  Mouth/Throat: Oropharynx is clear and moist. No oropharyngeal exudate.  Eyes: EOM are normal. Pupils are equal, round, and reactive to light. Left eye exhibits no discharge.  Neck: Normal range of motion. Neck supple. No JVD present. No tracheal  deviation present. No thyromegaly present.  Cardiovascular: Normal rate, regular rhythm, normal heart sounds and intact distal pulses.  Exam reveals no gallop and no friction rub.   No murmur heard. Pulmonary/Chest: Effort normal and breath sounds normal. No stridor. No respiratory distress. She has no wheezes. She has no rales. She exhibits no tenderness.  Decreased breath sounds but symmetrical  Abdominal: Soft. Bowel sounds are normal. She exhibits no distension and no mass. There is no tenderness. There is no rebound and no guarding.  Genitourinary: Vagina normal and uterus normal. Guaiac negative stool. No vaginal discharge found.  Musculoskeletal: Normal range of motion. She exhibits no edema and no tenderness.  Slight deformity of the right foot scar in right knee from previous total knee replacement  Lymphadenopathy:    She has no cervical adenopathy.  Neurological: She is alert. She has normal reflexes. No cranial nerve deficit. She exhibits normal muscle tone. Coordination normal.  Skin: Skin is warm and dry. She is not diaphoretic.  Psychiatric: She has a normal mood and affect. Her behavior is normal. Judgment and thought content normal.          Assessment & Plan:  Hypertension at goal continue current therapy diabetes type 2 at goal continue current therapy A1c now 7.0%  COPD asymptomatic  Status post right total knee replacement  Pain in right foot from previous fracture and now bone spurs followed by Dr. Jennell Corner  Hyperlipidemia continue Lipitor and aspirin  Sleep dysfunction continue Ambien

## 2014-04-06 NOTE — Progress Notes (Signed)
Pre visit review using our clinic review tool, if applicable. No additional management support is needed unless otherwise documented below in the visit note. 

## 2014-04-06 NOTE — Patient Instructions (Signed)
Tramadol 50 mg........ one half to one tablet twice daily when necessary for pain  Continue your other medications  Return in one year for general physical examination sooner if any problems  Dental checkup by Dr. Gloriann Loan

## 2014-04-07 ENCOUNTER — Telehealth: Payer: Self-pay | Admitting: Family Medicine

## 2014-04-07 NOTE — Telephone Encounter (Signed)
Relevant patient education assigned to patient using Emmi. ° °

## 2014-04-08 ENCOUNTER — Ambulatory Visit (AMBULATORY_SURGERY_CENTER): Payer: Medicare Other | Admitting: *Deleted

## 2014-04-08 VITALS — Ht 68.0 in | Wt 212.2 lb

## 2014-04-08 DIAGNOSIS — Z860100 Personal history of colon polyps, unspecified: Secondary | ICD-10-CM

## 2014-04-08 MED ORDER — MOVIPREP 100 G PO SOLR: ORAL | Status: DC

## 2014-04-08 NOTE — Progress Notes (Signed)
No allergies to eggs or soy. No problems with anesthesia.  No oxygen use  No diet drug use  

## 2014-04-22 ENCOUNTER — Encounter: Payer: Self-pay | Admitting: Internal Medicine

## 2014-04-22 ENCOUNTER — Ambulatory Visit (AMBULATORY_SURGERY_CENTER): Payer: Medicare Other | Admitting: Internal Medicine

## 2014-04-22 VITALS — BP 116/56 | HR 84 | Temp 97.2°F | Resp 19 | Ht 68.0 in | Wt 212.0 lb

## 2014-04-22 DIAGNOSIS — D126 Benign neoplasm of colon, unspecified: Secondary | ICD-10-CM | POA: Diagnosis not present

## 2014-04-22 DIAGNOSIS — Z86010 Personal history of colonic polyps: Secondary | ICD-10-CM

## 2014-04-22 LAB — GLUCOSE, CAPILLARY
GLUCOSE-CAPILLARY: 126 mg/dL — AB (ref 70–99)
Glucose-Capillary: 128 mg/dL — ABNORMAL HIGH (ref 70–99)

## 2014-04-22 MED ORDER — SODIUM CHLORIDE 0.9 % IV SOLN: 500.0000 mL | INTRAVENOUS | Status: DC

## 2014-04-22 NOTE — Patient Instructions (Signed)
YOU HAD AN ENDOSCOPIC PROCEDURE TODAY AT THE [facility]: Refer to the procedure report that was given to you for any specific questions about what was found during the examination.  If the procedure report does not answer your questions, please call your gastroenterologist to clarify.  If you requested that your care partner not be given the details of your procedure findings, then the procedure report has been included in a sealed envelope for you to review at your convenience later.  YOU SHOULD EXPECT: Some feelings of bloating in the abdomen. Passage of more gas than usual.  Walking can help get rid of the air that was put into your GI tract during the procedure and reduce the bloating. If you had a lower endoscopy (such as a colonoscopy or flexible sigmoidoscopy) you may notice spotting of blood in your stool or on the toilet paper. If you underwent a bowel prep for your procedure, then you may not have a normal bowel movement for a few days.  DIET: Your first meal following the procedure should be a light meal and then it is ok to progress to your normal diet.  A half-sandwich or bowl of soup is an example of a good first meal.  Heavy or fried foods are harder to digest and may make you feel nauseous or bloated.  Likewise meals heavy in dairy and vegetables can cause extra gas to form and this can also increase the bloating.  Drink plenty of fluids but you should avoid alcoholic beverages for 24 hours.  ACTIVITY: Your care partner should take you home directly after the procedure.  You should plan to take it easy, moving slowly for the rest of the day.  You can resume normal activity the day after the procedure however you should NOT DRIVE or use heavy machinery for 24 hours (because of the sedation medicines used during the test).    SYMPTOMS TO REPORT IMMEDIATELY: A gastroenterologist can be reached at any hour.  During normal business hours, 8:30 AM to 5:00 PM Monday through Friday,  call [phone].  After hours and on weekends, please call the GI answering service at [phone] who will take a message and have the physician on call contact you.   Following lower endoscopy (colonoscopy or flexible sigmoidoscopy):  Excessive amounts of blood in the stool  Significant tenderness or worsening of abdominal pains  Swelling of the abdomen that is new, acute  Fever of 100F or higher   FOLLOW UP: If any biopsies were taken you will be contacted by phone or by letter within the next 1-3 weeks.  Call your gastroenterologist if you have not heard about the biopsies in 3 weeks.  Our staff will call the home number listed on your records the next business day following your procedure to check on you and address any questions or concerns that you may have at that time regarding the information given to you following your procedure. This is a courtesy call and so if there is no answer at the home number and we have not heard from you through the emergency physician on call, we will assume that you have returned to your regular daily activities without incident.  SIGNATURES/CONFIDENTIALITY: You and/or your care partner have signed paperwork which will be entered into your electronic medical record.  These signatures attest to the fact that that the information above on your After Visit Summary has been reviewed and is understood.  Full responsibility of the confidentiality of   this discharge information lies with you and/or your care-partner.   Polyp, diverticulosis, and high fiber diet information given.  No aspirin, anti-inflammatory medications for 2 weeks.  Next colonoscopy 5 years-2020.

## 2014-04-22 NOTE — Op Note (Signed)
[place]  [address]. [place], [id number]   COLONOSCOPY PROCEDURE REPORT  PATIENT: [name], [name].  MR#: [id number] BIRTHDATE: 03-17-1946 , 67  yrs. old GENDER: Female ENDOSCOPIST: Lafayette Dragon, MD REFERRED NA:[id number] Delora Fuel, M.D. PROCEDURE DATE:  04/22/2014 PROCEDURE:   Colonoscopy, surveillance First Screening Colonoscopy - Avg.  risk and is 50 yrs.  old or older - No.  Prior Negative Screening - Now for repeat screening. N/A  History of Adenoma - Now for follow-up colonoscopy & has been > or = to 3 yrs.  Yes hx of adenoma.  Has been 3 or more years since last colonoscopy.  Polyps Removed Today? Yes. ASA CLASS:   Class II INDICATIONS:Adenomatous polyps removed on colonoscopy in 1999 and 2002.  Hyperplastic polyp removed in 2005.  Tubular adenoma removed in April 2010 MEDICATIONS: MAC sedation, administered by CRNA and Propofol (Diprivan) 320 mg IV  DESCRIPTION OF PROCEDURE:   After the risks benefits and alternatives of the procedure were thoroughly explained, informed consent was obtained.  A digital rectal exam revealed no abnormalities of the rectum.   The LB [id number]-[id number]  endoscope was introduced through the anus and advanced to the cecum, which was identified by both the appendix and ileocecal valve. No adverse events experienced.   The quality of the prep was good, using MoviPrep  The instrument was then slowly withdrawn as the colon was fully examined.      COLON FINDINGS: Three  polyps were found.,2 in the descending colon up next to each other. The smaller one measured to 6 mm and was sessile and was removed with cold snare. A larger polyp measuring 20 mm was semi-pedunculated and was removed with hot snare. A third polyp was a diminutive polyp at 15 cm and was removed with cold biopsies  A polypectomy was performed with cold forceps,   The resection was complete and the polyp tissue was completely retrieved.   There was moderate  diverticulosis noted in the sigmoid colon with associated angulation, tortuosity, colonic narrowing and colonic spasm.  Retroflexed views revealed no abnormalities. The time to cecum=9 minutes 29 seconds.  Withdrawal time=10 minutes 09 seconds.  The scope was withdrawn and the procedure completed. COMPLICATIONS: There were no complications.  ENDOSCOPIC IMPRESSION: 1.   Three semi-pedunculated polyps were found; polypectomy was performed with cold forceps, with a cold snare and using snare cautery 2.   There was moderate diverticulosis noted in the sigmoid colon  RECOMMENDATIONS: 1.  Await pathology results 2.  no aspirin or anti-inflammatory agents for 2 weeks Recall colonoscopy in 5 years   eSigned:  Lafayette Dragon, MD 04/22/2014 9:09 AM   cc:   PATIENT NAME:  Lynn [name]. MR#: [id number]

## 2014-04-22 NOTE — Progress Notes (Signed)
Report to PACU, RN, vss, BBS= Clear.  

## 2014-04-22 NOTE — Progress Notes (Signed)
Called to room to assist during endoscopic procedure.  Patient ID and intended procedure confirmed with present staff. Received instructions for my participation in the procedure from the performing physician.  

## 2014-04-23 ENCOUNTER — Telehealth: Payer: Self-pay | Admitting: *Deleted

## 2014-04-23 NOTE — Telephone Encounter (Signed)
  Follow up Call-  Call back number 04/22/2014  Post procedure Call Back phone  # [phone]  Permission to leave phone message Yes     No answer at # given.  Left message on voicemail.

## 2014-04-28 LAB — HM DIABETES EYE EXAM

## 2014-04-29 ENCOUNTER — Encounter: Payer: Self-pay | Admitting: Internal Medicine

## 2014-04-30 ENCOUNTER — Encounter: Payer: Self-pay | Admitting: Family Medicine

## 2014-05-21 ENCOUNTER — Other Ambulatory Visit: Payer: Self-pay | Admitting: *Deleted

## 2014-05-21 NOTE — Telephone Encounter (Signed)
Per Dr Sherren Mocha okay to take ATB. Left message on machine for patient for directions.

## 2014-05-22 MED ORDER — AMOXICILLIN 500 MG PO CAPS: ORAL_CAPSULE | ORAL | Status: DC

## 2014-05-22 NOTE — Telephone Encounter (Signed)
Spoke with patient and rx sent 

## 2014-07-06 ENCOUNTER — Ambulatory Visit (INDEPENDENT_AMBULATORY_CARE_PROVIDER_SITE_OTHER): Payer: Medicare Other | Admitting: Family Medicine

## 2014-07-06 ENCOUNTER — Encounter: Payer: Self-pay | Admitting: Family Medicine

## 2014-07-06 VITALS — BP 130/90

## 2014-07-06 DIAGNOSIS — G8929 Other chronic pain: Secondary | ICD-10-CM | POA: Insufficient documentation

## 2014-07-06 DIAGNOSIS — Z23 Encounter for immunization: Secondary | ICD-10-CM

## 2014-07-06 DIAGNOSIS — M79609 Pain in unspecified limb: Secondary | ICD-10-CM

## 2014-07-06 DIAGNOSIS — M79671 Pain in right foot: Principal | ICD-10-CM

## 2014-07-06 DIAGNOSIS — E119 Type 2 diabetes mellitus without complications: Secondary | ICD-10-CM

## 2014-07-06 MED ORDER — PREDNISONE 20 MG PO TABS: ORAL_TABLET | ORAL | Status: DC

## 2014-07-06 NOTE — Patient Instructions (Signed)
Prednisone 20 mg one tablet x5 days, a half a tab x5 days, then a half a tablet Monday Wednesday Friday for 3 weeks  Elevation and ice 30 minutes 4 times daily  Blute with crutches  Return to Dr. Jennell Corner when necessary

## 2014-07-06 NOTE — Progress Notes (Signed)
   Subjective:    Patient ID: Marlana Salvage, female    DOB: 1946-02-14, 68 y.o.   MRN: [id number]  HPI [name] is safe 68 year old single female nonsmoker......Marland Kitchen retired Marine scientist....... who comes in today for evaluation of pain in her right foot  She tells me last February she broke of 3 metatarsals in her right foot. She subsequently was referred to Dr. Wylene Simmer. They did x-rays and MRIs........ which she states she still pain for...... and had it injected under fluoroscopy. After that her pain was 100% gone. About a month later the pain returned. She went back to Dr. Doran Durand and was started on Mobic. She had GI side effects therefore stopped it.  She points to the lateral portion of her right foot where there is a bump consistent with a bone spur.   Review of Systems Review of systems otherwise negative    Objective:   Physical Exam   Well-developed and nourished female no acute distress vital signs stable she is afebrile examination of the right foot shows the skin to be normal pulses 1+ and symmetrical tenderness the right fourth metatarsal,,,,,,, bone spur     Assessment & Plan:  Bone spur right fourth metatarsal secondary to previous fracture....... trial of oral prednisone elevation ice and boot........Marland Kitchen

## 2014-07-10 LAB — HM DEXA SCAN

## 2014-07-17 ENCOUNTER — Telehealth: Payer: Self-pay | Admitting: *Deleted

## 2014-07-17 NOTE — Telephone Encounter (Signed)
Left message on machine for patient to return our call to schedule a nurse visit for a BP check Diabetic bundle

## 2014-08-13 ENCOUNTER — Ambulatory Visit (INDEPENDENT_AMBULATORY_CARE_PROVIDER_SITE_OTHER): Payer: Medicare Other | Admitting: Podiatry

## 2014-08-13 ENCOUNTER — Ambulatory Visit (INDEPENDENT_AMBULATORY_CARE_PROVIDER_SITE_OTHER): Payer: Medicare Other

## 2014-08-13 VITALS — BP 132/78

## 2014-08-13 DIAGNOSIS — S93601A Unspecified sprain of right foot, initial encounter: Secondary | ICD-10-CM | POA: Diagnosis not present

## 2014-08-13 DIAGNOSIS — R52 Pain, unspecified: Secondary | ICD-10-CM

## 2014-08-13 DIAGNOSIS — M779 Enthesopathy, unspecified: Secondary | ICD-10-CM

## 2014-08-13 MED ORDER — TRIAMCINOLONE ACETONIDE 10 MG/ML IJ SUSP
10.0000 mg | Freq: Once | INTRAMUSCULAR | Status: AC
  Administered 2014-08-13: 10 mg

## 2014-08-13 NOTE — Progress Notes (Signed)
   Subjective:    Patient ID: Lynn [name], female    DOB: [birth year], 68 y.o.   MRN: [id number]  HPI Pt presents with right foot pain, says she has a h/o of fracture and bone spur, states that she felt a snap a few days ago and the pain is worsening. Noted swelling, has been using ice and elevation   Review of Systems  All other systems reviewed and are negative.      Objective:   Physical Exam        Assessment & Plan:

## 2014-08-14 NOTE — Progress Notes (Signed)
Subjective:     Patient ID: Lynn [name], female   DOB: 02/13/1946, 68 y.o.   MRN: [id number]  HPI patient points to her right foot feeling like she may have broken something and she is seeing another physician who put her into a boot but he continues to hurt on the outside of her right foot   Review of Systems     Objective:   Physical Exam Neurovascular status intact with inflammation and pain on the lateral side of the right midfoot with the dorsal of the midfoot doing better after previous cortisone injection by another physician    Assessment:     Probable tendinitis with possible fracture lateral side foot    Plan:     X-rays taken and reviewed and today I did a careful lateral injection 3 mg Kenalog 5 mg Xylocaine and advised on continued boot usage and if symptoms persist she will reappoint

## 2014-10-08 ENCOUNTER — Other Ambulatory Visit (INDEPENDENT_AMBULATORY_CARE_PROVIDER_SITE_OTHER): Payer: Medicare Other

## 2014-10-08 ENCOUNTER — Encounter: Payer: Self-pay | Admitting: Family Medicine

## 2014-10-08 DIAGNOSIS — E119 Type 2 diabetes mellitus without complications: Secondary | ICD-10-CM

## 2014-10-08 LAB — BASIC METABOLIC PANEL
BUN: 14 mg/dL (ref 6–23)
CHLORIDE: 102 meq/L (ref 96–112)
CO2: 28 mEq/L (ref 19–32)
Calcium: 9.6 mg/dL (ref 8.4–10.5)
Creatinine, Ser: 0.9 mg/dL (ref 0.4–1.2)
GFR: 69.67 mL/min (ref >60.00)
Glucose, Bld: 148 mg/dL — ABNORMAL HIGH (ref 70–99)
POTASSIUM: 3.9 meq/L (ref 3.5–5.1)
Sodium: 139 mEq/L (ref 135–145)

## 2014-10-08 LAB — HEMOGLOBIN A1C: HEMOGLOBIN A1C: 7.4 % — AB (ref 4.6–6.5)

## 2014-10-15 ENCOUNTER — Encounter: Payer: Self-pay | Admitting: Family Medicine

## 2014-10-15 ENCOUNTER — Ambulatory Visit (INDEPENDENT_AMBULATORY_CARE_PROVIDER_SITE_OTHER): Payer: Medicare Other | Admitting: Family Medicine

## 2014-10-15 VITALS — BP 110/70 | Temp 98.4°F

## 2014-10-15 DIAGNOSIS — G4700 Insomnia, unspecified: Secondary | ICD-10-CM

## 2014-10-15 DIAGNOSIS — J4531 Mild persistent asthma with (acute) exacerbation: Secondary | ICD-10-CM

## 2014-10-15 DIAGNOSIS — E139 Other specified diabetes mellitus without complications: Secondary | ICD-10-CM

## 2014-10-15 MED ORDER — ZOLPIDEM TARTRATE 5 MG PO TABS: ORAL_TABLET | ORAL | Status: DC

## 2014-10-15 MED ORDER — MONTELUKAST SODIUM 10 MG PO TABS: 10.0000 mg | ORAL_TABLET | Freq: Every day | ORAL | Status: DC

## 2014-10-15 NOTE — Progress Notes (Signed)
Pre visit review using our clinic review tool, if applicable. No additional management support is needed unless otherwise documented below in the visit note. Lab Results  Component Value Date   HGBA1C 7.4* 10/08/2014   HGBA1C 7.0* 03/24/2014   HGBA1C 6.9* 09/29/2013   Lab Results  Component Value Date   MICROALBUR 0.4 03/24/2014   LDLCALC 87 03/24/2014   CREATININE 0.9 10/08/2014

## 2014-10-15 NOTE — Progress Notes (Signed)
   Subjective:    Patient ID: Lynn [name], female    DOB: 03-07-1946, 68 y.o.   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 [facility] and does water aerobics. Recent blood sugar 148 A1c 7.4. She's had to take a couple rounds of steroids his fall. 1 for asthma another one for severe tendinitis in her foot. She had an injection by her orthopedist.  She says she feels well except on Saturday she began wheezing. On Tuesday she started taking 20 mg of prednisone daily. After 3 days of 20 mg she feels somewhat better. She has recurrences of her asthma 3-4 times a year. It she always worse in the winter. She does not recall any specific trigger   Review of Systems Review of systems otherwise negative    Objective:   Physical Exam Well-developed well-nourished female no acute distress vital signs stable she's afebrile HEENT were negative neck was supple no adenopathy lungs are clear except for some mild symmetrical late expiratory wheezing on forced expiration       Assessment & Plan:  Diabetes type 2.......... continue current therapy follow-up A1c in 3 months  Asthma......... Prednisone...... 20 mg daily till clear then taper as outlined also restart in nebulizer  Add Singulair.

## 2014-10-15 NOTE — Patient Instructions (Signed)
Prednisone 20 mg............. one tablet daily until clear then taper as follows.....Marland Kitchen one half tab for 5 days then one half tab Monday Wednesday Friday for a two-week taper  Return in 3 months for follow-up on your diabetes  Nonfasting labs one week prior  Start Singulair 10 mg 1 every night at bedtime

## 2014-11-30 DIAGNOSIS — Z01419 Encounter for gynecological examination (general) (routine) without abnormal findings: Secondary | ICD-10-CM | POA: Diagnosis not present

## 2014-12-08 ENCOUNTER — Encounter (HOSPITAL_COMMUNITY): Payer: Self-pay

## 2014-12-08 ENCOUNTER — Emergency Department (HOSPITAL_COMMUNITY)
Admission: EM | Admit: 2014-12-08 | Discharge: 2014-12-08 | Disposition: A | Discharge status: Home / Self Care | Payer: Medicare Other | Attending: Emergency Medicine | Admitting: Emergency Medicine

## 2014-12-08 DIAGNOSIS — E785 Hyperlipidemia, unspecified: Secondary | ICD-10-CM | POA: Insufficient documentation

## 2014-12-08 DIAGNOSIS — I10 Essential (primary) hypertension: Secondary | ICD-10-CM | POA: Diagnosis not present

## 2014-12-08 DIAGNOSIS — Z86010 Personal history of colonic polyps: Secondary | ICD-10-CM | POA: Insufficient documentation

## 2014-12-08 DIAGNOSIS — Z79899 Other long term (current) drug therapy: Secondary | ICD-10-CM | POA: Diagnosis not present

## 2014-12-08 DIAGNOSIS — J45909 Unspecified asthma, uncomplicated: Secondary | ICD-10-CM | POA: Insufficient documentation

## 2014-12-08 DIAGNOSIS — Z8739 Personal history of other diseases of the musculoskeletal system and connective tissue: Secondary | ICD-10-CM | POA: Insufficient documentation

## 2014-12-08 DIAGNOSIS — R1912 Hyperactive bowel sounds: Secondary | ICD-10-CM | POA: Insufficient documentation

## 2014-12-08 DIAGNOSIS — Z7982 Long term (current) use of aspirin: Secondary | ICD-10-CM | POA: Insufficient documentation

## 2014-12-08 DIAGNOSIS — E119 Type 2 diabetes mellitus without complications: Secondary | ICD-10-CM | POA: Diagnosis not present

## 2014-12-08 DIAGNOSIS — H9190 Unspecified hearing loss, unspecified ear: Secondary | ICD-10-CM | POA: Insufficient documentation

## 2014-12-08 DIAGNOSIS — R197 Diarrhea, unspecified: Secondary | ICD-10-CM | POA: Insufficient documentation

## 2014-12-08 DIAGNOSIS — R112 Nausea with vomiting, unspecified: Secondary | ICD-10-CM | POA: Insufficient documentation

## 2014-12-08 DIAGNOSIS — Z872 Personal history of diseases of the skin and subcutaneous tissue: Secondary | ICD-10-CM | POA: Diagnosis not present

## 2014-12-08 DIAGNOSIS — F329 Major depressive disorder, single episode, unspecified: Secondary | ICD-10-CM | POA: Insufficient documentation

## 2014-12-08 DIAGNOSIS — Z87891 Personal history of nicotine dependence: Secondary | ICD-10-CM | POA: Insufficient documentation

## 2014-12-08 DIAGNOSIS — E860 Dehydration: Secondary | ICD-10-CM | POA: Diagnosis not present

## 2014-12-08 LAB — CBC WITH DIFFERENTIAL/PLATELET
Basophils Absolute: 0 10*3/uL (ref 0.0–0.1)
Basophils Relative: 0 % (ref 0–1)
EOS ABS: 0.2 10*3/uL (ref 0.0–0.7)
Eosinophils Relative: 2 % (ref 0–5)
HCT: 45.7 % (ref 36.0–46.0)
Hemoglobin: 14.9 g/dL (ref 12.0–15.0)
Lymphocytes Relative: 12 % (ref 12–46)
Lymphs Abs: 1.1 10*3/uL (ref 0.7–4.0)
MCH: 29.7 pg (ref 26.0–34.0)
MCHC: 32.6 g/dL (ref 30.0–36.0)
MCV: 91.2 fL (ref 78.0–100.0)
MONOS PCT: 11 % (ref 3–12)
Monocytes Absolute: 1 10*3/uL (ref 0.1–1.0)
NEUTROS ABS: 6.8 10*3/uL (ref 1.7–7.7)
NEUTROS PCT: 75 % (ref 43–77)
Platelets: 184 10*3/uL (ref 150–400)
RBC: 5.01 MIL/uL (ref 3.87–5.11)
RDW: 13.7 % (ref 11.5–15.5)
WBC: 9.1 10*3/uL (ref 4.0–10.5)

## 2014-12-08 LAB — COMPREHENSIVE METABOLIC PANEL
ALK PHOS: 107 U/L (ref 39–117)
ALT: 14 U/L (ref 0–35)
AST: 20 U/L (ref 0–37)
Albumin: 3.9 g/dL (ref 3.5–5.2)
Anion gap: 9 (ref 5–15)
BILIRUBIN TOTAL: 0.8 mg/dL (ref 0.3–1.2)
BUN: 20 mg/dL (ref 6–23)
CO2: 25 mmol/L (ref 19–32)
CREATININE: 0.84 mg/dL (ref 0.50–1.10)
Calcium: 9.1 mg/dL (ref 8.4–10.5)
Chloride: 103 mmol/L (ref 96–112)
GFR calc Af Amer: 81 mL/min — ABNORMAL LOW (ref >90)
GFR, EST NON AFRICAN AMERICAN: 70 mL/min — AB (ref >90)
Glucose, Bld: 190 mg/dL — ABNORMAL HIGH (ref 70–99)
Potassium: 4.2 mmol/L (ref 3.5–5.1)
SODIUM: 137 mmol/L (ref 135–145)
Total Protein: 6.2 g/dL (ref 6.0–8.3)

## 2014-12-08 LAB — I-STAT CG4 LACTIC ACID, ED: Lactic Acid, Venous: 1.66 mmol/L (ref 0.5–2.0)

## 2014-12-08 MED ORDER — ONDANSETRON 8 MG PO TBDP: 8.0000 mg | ORAL_TABLET | Freq: Three times a day (TID) | ORAL | Status: DC | PRN

## 2014-12-08 MED ORDER — SODIUM CHLORIDE 0.9 % IV BOLUS (SEPSIS)
1000.0000 mL | Freq: Once | INTRAVENOUS | Status: AC
  Administered 2014-12-08: 1000 mL via INTRAVENOUS

## 2014-12-08 MED ORDER — ONDANSETRON HCL 4 MG/2ML IJ SOLN
4.0000 mg | Freq: Once | INTRAMUSCULAR | Status: AC
  Administered 2014-12-08: 4 mg via INTRAVENOUS
  Filled 2014-12-08: qty 2

## 2014-12-08 NOTE — ED Notes (Signed)
Pt denied questions or concerns, ambulatory at dc

## 2014-12-08 NOTE — ED Notes (Signed)
Pt unable to give urine sample, M.D. Aware. Pt ok to DC home

## 2014-12-08 NOTE — ED Notes (Signed)
Pt states she cannot give urine sample at this time.

## 2014-12-08 NOTE — Discharge Instructions (Signed)
Diarrhea Diarrhea is frequent loose and watery bowel movements. It can cause you to feel weak and dehydrated. Dehydration can cause you to become tired and thirsty, have a dry mouth, and have decreased urination that often is dark yellow. Diarrhea is a sign of another problem, most often an infection that will not last long. In most cases, diarrhea typically lasts 2-3 days. However, it can last longer if it is a sign of something more serious. It is important to treat your diarrhea as directed by your caregiver to lessen or prevent future episodes of diarrhea. CAUSES  Some common causes include:  Gastrointestinal infections caused by viruses, bacteria, or parasites.  Food poisoning or food allergies.  Certain medicines, such as antibiotics, chemotherapy, and laxatives.  Artificial sweeteners and fructose.  Digestive disorders. HOME CARE INSTRUCTIONS  Ensure adequate fluid intake (hydration): Have 1 cup (8 oz) of fluid for each diarrhea episode. Avoid fluids that contain simple sugars or sports drinks, fruit juices, whole milk products, and sodas. Your urine should be clear or pale yellow if you are drinking enough fluids. Hydrate with an oral rehydration solution that you can purchase at pharmacies, retail stores, and online. You can prepare an oral rehydration solution at home by mixing the following ingredients together:   - tsp table salt.   tsp baking soda.   tsp salt substitute containing potassium chloride.  1  tablespoons sugar.  1 L (34 oz) of water.  Certain foods and beverages may increase the speed at which food moves through the gastrointestinal (GI) tract. These foods and beverages should be avoided and include:  Caffeinated and alcoholic beverages.  High-fiber foods, such as raw fruits and vegetables, nuts, seeds, and whole grain breads and cereals.  Foods and beverages sweetened with sugar alcohols, such as xylitol, sorbitol, and mannitol.  Some foods may be well  tolerated and may help thicken stool including:  Starchy foods, such as rice, toast, pasta, low-sugar cereal, oatmeal, grits, baked potatoes, crackers, and bagels.  Bananas.  Applesauce.  Add probiotic-rich foods to help increase healthy bacteria in the GI tract, such as yogurt and fermented milk products.  Wash your hands well after each diarrhea episode.  Only take over-the-counter or prescription medicines as directed by your caregiver.  Take a warm bath to relieve any burning or pain from frequent diarrhea episodes. SEEK IMMEDIATE MEDICAL CARE IF:   You are unable to keep fluids down.  You have persistent vomiting.  You have blood in your stool, or your stools are black and tarry.  You do not urinate in 6-8 hours, or there is only a small amount of very dark urine.  You have abdominal pain that increases or localizes.  You have weakness, dizziness, confusion, or light-headedness.  You have a severe headache.  Your diarrhea gets worse or does not get better.  You have a fever or persistent symptoms for more than 2-3 days.  You have a fever and your symptoms suddenly get worse. MAKE SURE YOU:   Understand these instructions.  Will watch your condition.  Will get help right away if you are not doing well or get worse. Document Released: 10/13/2002 Document Revised: 03/09/2014 Document Reviewed: 06/30/2012 [facility] Patient Information 2015 [place], [place]. This information is not intended to replace advice given to you by your health care provider. Make sure you discuss any questions you have with your health care provider.  Food Choices to Help Relieve Diarrhea When you have diarrhea, the foods you eat and your  eating habits are very important. Choosing the right foods and drinks can help relieve diarrhea. Also, because diarrhea can last up to 7 days, you need to replace lost fluids and electrolytes (such as sodium, potassium, and chloride) in order to help prevent  dehydration.  WHAT GENERAL GUIDELINES DO I NEED TO FOLLOW?  Slowly drink 1 cup (8 oz) of fluid for each episode of diarrhea. If you are getting enough fluid, your urine will be clear or pale yellow.  Eat starchy foods. Some good choices include white rice, white toast, pasta, low-fiber cereal, baked potatoes (without the skin), saltine crackers, and bagels.  Avoid large servings of any cooked vegetables.  Limit fruit to two servings per day. A serving is  cup or 1 small piece.  Choose foods with less than 2 g of fiber per serving.  Limit fats to less than 8 tsp (38 g) per day.  Avoid fried foods.  Eat foods that have probiotics in them. Probiotics can be found in certain dairy products.  Avoid foods and beverages that may increase the speed at which food moves through the stomach and intestines (gastrointestinal tract). Things to avoid include:  High-fiber foods, such as dried fruit, raw fruits and vegetables, nuts, seeds, and whole grain foods.  Spicy foods and high-fat foods.  Foods and beverages sweetened with high-fructose corn syrup, honey, or sugar alcohols such as xylitol, sorbitol, and mannitol. WHAT FOODS ARE RECOMMENDED? Grains White rice. White, [place], or pita breads (fresh or toasted), including plain rolls, buns, or bagels. White pasta. Saltine, soda, or graham crackers. Pretzels. Low-fiber cereal. Cooked cereals made with water (such as cornmeal, farina, or cream cereals). Plain muffins. Matzo. Melba toast. Zwieback.  Vegetables Potatoes (without the skin). Strained tomato and vegetable juices. Most well-cooked and canned vegetables without seeds. Tender lettuce. Fruits Cooked or canned applesauce, apricots, cherries, fruit cocktail, grapefruit, peaches, pears, or plums. Fresh bananas, apples without skin, cherries, grapes, cantaloupe, grapefruit, peaches, oranges, or plums.  Meat and Other Protein Products Baked or boiled chicken. Eggs. Tofu. Fish. Seafood. Smooth  peanut butter. Ground or well-cooked tender beef, ham, veal, lamb, pork, or poultry.  Dairy Plain yogurt, kefir, and unsweetened liquid yogurt. Lactose-free milk, buttermilk, or soy milk. Plain hard cheese. Beverages Sport drinks. Clear broths. Diluted fruit juices (except prune). Regular, caffeine-free sodas such as ginger ale. Water. Decaffeinated teas. Oral rehydration solutions. Sugar-free beverages not sweetened with sugar alcohols. Other Bouillon, broth, or soups made from recommended foods.  The items listed above may not be a complete list of recommended foods or beverages. Contact your dietitian for more options. WHAT FOODS ARE NOT RECOMMENDED? Grains Whole grain, whole wheat, bran, or rye breads, rolls, pastas, crackers, and cereals. Wild or brown rice. Cereals that contain more than 2 g of fiber per serving. Corn tortillas or taco shells. Cooked or dry oatmeal. Granola. Popcorn. Vegetables Raw vegetables. Cabbage, broccoli, Brussels sprouts, artichokes, baked beans, beet greens, corn, kale, legumes, peas, sweet potatoes, and yams. Potato skins. Cooked spinach and cabbage. Fruits Dried fruit, including raisins and dates. Raw fruits. Stewed or dried prunes. Fresh apples with skin, apricots, mangoes, pears, raspberries, and strawberries.  Meat and Other Protein Products Chunky peanut butter. Nuts and seeds. Beans and lentils. Berniece Salines.  Dairy High-fat cheeses. Milk, chocolate milk, and beverages made with milk, such as milk shakes. Cream. Ice cream. Sweets and Desserts Sweet rolls, doughnuts, and sweet breads. Pancakes and waffles. Fats and Oils Butter. Cream sauces. Margarine. Salad oils. Plain salad dressings. Olives.  Avocados.  Beverages Caffeinated beverages (such as coffee, tea, soda, or energy drinks). Alcoholic beverages. Fruit juices with pulp. Prune juice. Soft drinks sweetened with high-fructose corn syrup or sugar alcohols. Other Coconut. Hot sauce. Chili powder. Mayonnaise.  Gravy. Cream-based or milk-based soups.  The items listed above may not be a complete list of foods and beverages to avoid. Contact your dietitian for more information. WHAT SHOULD I DO IF I BECOME DEHYDRATED? Diarrhea can sometimes lead to dehydration. Signs of dehydration include dark urine and dry mouth and skin. If you think you are dehydrated, you should rehydrate with an oral rehydration solution. These solutions can be purchased at pharmacies, retail stores, or online.  Drink -1 cup (120-240 mL) of oral rehydration solution each time you have an episode of diarrhea. If drinking this amount makes your diarrhea worse, try drinking smaller amounts more often. For example, drink 1-3 tsp (5-15 mL) every 5-10 minutes.  A general rule for staying hydrated is to drink 1-2 L of fluid per day. Talk to your health care provider about the specific amount you should be drinking each day. Drink enough fluids to keep your urine clear or pale yellow. Document Released: 01/13/2004 Document Revised: 10/28/2013 Document Reviewed: 09/15/2013 [facility] Patient Information 2015 [place], [place]. This information is not intended to replace advice given to you by your health care provider. Make sure you discuss any questions you have with your health care provider.  Nausea and Vomiting Nausea is a sick feeling that often comes before throwing up (vomiting). Vomiting is a reflex where stomach contents come out of your mouth. Vomiting can cause severe loss of body fluids (dehydration). Children and elderly adults can become dehydrated quickly, especially if they also have diarrhea. Nausea and vomiting are symptoms of a condition or disease. It is important to find the cause of your symptoms. CAUSES   Direct irritation of the stomach lining. This irritation can result from increased acid production (gastroesophageal reflux disease), infection, food poisoning, taking certain medicines (such as nonsteroidal anti-inflammatory  drugs), alcohol use, or tobacco use.  Signals from the brain.These signals could be caused by a headache, heat exposure, an inner ear disturbance, increased pressure in the brain from injury, infection, a tumor, or a concussion, pain, emotional stimulus, or metabolic problems.  An obstruction in the gastrointestinal tract (bowel obstruction).  Illnesses such as diabetes, hepatitis, gallbladder problems, appendicitis, kidney problems, cancer, sepsis, atypical symptoms of a heart attack, or eating disorders.  Medical treatments such as chemotherapy and radiation.  Receiving medicine that makes you sleep (general anesthetic) during surgery. DIAGNOSIS Your caregiver may ask for tests to be done if the problems do not improve after a few days. Tests may also be done if symptoms are severe or if the reason for the nausea and vomiting is not clear. Tests may include:  Urine tests.  Blood tests.  Stool tests.  Cultures (to look for evidence of infection).  X-rays or other imaging studies. Test results can help your caregiver make decisions about treatment or the need for additional tests. TREATMENT You need to stay well hydrated. Drink frequently but in small amounts.You may wish to drink water, sports drinks, clear broth, or eat frozen ice pops or gelatin dessert to help stay hydrated.When you eat, eating slowly may help prevent nausea.There are also some antinausea medicines that may help prevent nausea. HOME CARE INSTRUCTIONS   Take all medicine as directed by your caregiver.  If you do not have an appetite, do not force  yourself to eat. However, you must continue to drink fluids.  If you have an appetite, eat a normal diet unless your caregiver tells you differently.  Eat a variety of complex carbohydrates (rice, wheat, potatoes, bread), lean meats, yogurt, fruits, and vegetables.  Avoid high-fat foods because they are more difficult to digest.  Drink enough water and fluids to  keep your urine clear or pale yellow.  If you are dehydrated, ask your caregiver for specific rehydration instructions. Signs of dehydration may include:  Severe thirst.  Dry lips and mouth.  Dizziness.  Dark urine.  Decreasing urine frequency and amount.  Confusion.  Rapid breathing or pulse. SEEK IMMEDIATE MEDICAL CARE IF:   You have blood or brown flecks (like coffee grounds) in your vomit.  You have black or bloody stools.  You have a severe headache or stiff neck.  You are confused.  You have severe abdominal pain.  You have chest pain or trouble breathing.  You do not urinate at least once every 8 hours.  You develop cold or clammy skin.  You continue to vomit for longer than 24 to 48 hours.  You have a fever. MAKE SURE YOU:   Understand these instructions.  Will watch your condition.  Will get help right away if you are not doing well or get worse. Document Released: 10/23/2005 Document Revised: 01/15/2012 Document Reviewed: 03/22/2011 [facility] Patient Information 2015 [place], [place]. This information is not intended to replace advice given to you by your health care provider. Make sure you discuss any questions you have with your health care provider.

## 2014-12-08 NOTE — ED Notes (Signed)
Pt made aware of urine specimen needed

## 2014-12-08 NOTE — ED Notes (Signed)
Patient reports having sudden onset N/V/D at home with fever.  Afebrile upon arrival to ED.

## 2014-12-08 NOTE — ED Provider Notes (Signed)
0800 - care from Dr. Ricki Miller. Patient here with nausea, vomiting, diarrhea. Labs okay. Awaiting urine specimen. Plan is to hopefully discharge home when she tolerates by mouth. Patient is becoming very upset psychiatric patient is next to her. Patient in the next room is yelling and screaming and Mrs. Grenfell would like to leave. She has still not provided [place] a urine sample. Patient would like to go home. She is ambulatory and stable for discharge.  Evelina Bucy, MD 12/08/14 1130

## 2014-12-08 NOTE — ED Notes (Signed)
Pt talked with EDP and will give urine after fluids.

## 2014-12-08 NOTE — ED Provider Notes (Signed)
CSN: [id number]     Arrival date & time 12/08/14  0528 History   First MD Initiated Contact with Patient 12/08/14 [phone]     Chief Complaint  Patient presents with  . Diarrhea  . Emesis     (Consider location/radiation/quality/duration/timing/severity/associated sxs/prior Treatment) HPI 69 year old female presents to the emergency department from home with complaint of nausea, vomiting and diarrhea.  She reports onset yesterday after eating lunch at a [employer].  Patient reports fever at home of 102.  No further fevers.  She denies any abdominal pain.  No urinary symptoms.  No sick contacts.  Patient has history of diabetes for which she is on Glucophage.  Patient was concerned about becoming dehydrated.  Patient is a prior Designer, television/film set. Past Medical History  Diagnosis Date  . DIABETES MELLITUS, TYPE II 03/14/2007  . HYPERLIPIDEMIA 06/21/2010  . DEGENERATION, MACULAR NOS 03/14/2007  . Unspecified hearing loss 01/13/2010  . HYPERTENSION 03/14/2007  . DIVERTICULOSIS OF COLON 01/11/2009  . [place] DISEASE, LUMBAR 05/25/2009  . Diarrhea 05/31/2007  . 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 VERTIGO 11/10/2010  . SEBORRHEIC KERATOSIS, INFLAMED 12/02/2010  . ASTHMA 03/14/2007    NO PROBELEM IN 12 YRS  . DEPRESSION 03/14/2007  . Lung abnormality     LT LUNG WITH SPOT HAS BEEN FOLLOWED X 3 YRS DR Arlyce Dice  . Blood dyscrasia     VON WILLIBRANDE "FREE BLEEDER"   Past Surgical History  Procedure Laterality Date  . Cholecystectomy  1982    urosespsis post op  . Tonsillectomy  1952  . Spine surgery  1996    fusion, ruptured disk  . Knee arthroscopy Right 1995    right, TNR  . Joint replacement  [place], REPLACED JOINT  . Left shoulder Left 2012   Family History  Problem Relation Age of Onset  . Heart disease Other   . Diabetes Other     diabeties  . Hyperlipidemia Other   . Hypertension Other   . Cancer Mother   . Diabetes Mother   . Heart disease Mother   .  Hyperlipidemia Mother   . Hypertension Mother   . Hyperlipidemia Father   . Hypertension Father   . Colon cancer Maternal Aunt 103  . Colon cancer Maternal Uncle 82  . Colon cancer Maternal Uncle 80   History  Substance Use Topics  . Smoking status: Former Smoker -- 2.00 packs/day for 37 years    Types: Cigarettes    Quit date: 09/19/1999  . Smokeless tobacco: Never Used  . Alcohol Use: Yes     Comment: rare   OB History    No data available     Review of Systems   See History of Present Illness; otherwise all other systems are reviewed and negative  Allergies  Morphine and related  Home Medications   Prior to Admission medications   Medication Sig Start Date End Date Taking? Authorizing Provider  acetaminophen (TYLENOL) 325 MG tablet Take 650 mg by mouth every 6 (six) hours as needed for moderate pain.   Yes Historical Provider, MD  aspirin 81 MG tablet Take 81 mg by mouth daily.     Yes Historical Provider, MD  atorvastatin (LIPITOR) 40 MG tablet Take 1 tablet (40 mg total) by mouth daily. 04/06/14  Yes Dorena Cookey, MD  citalopram (CELEXA) 20 MG tablet 1 by mouth each bedtime Patient taking differently: Take 20 mg by mouth daily.  04/06/14  Yes Dorena Cookey, MD  fluticasone (FLONASE) 50 MCG/ACT nasal spray 1 spray up each nostril  at bedtime Patient taking differently: Place 1 spray into both nostrils daily as needed. For seasonal allergies 10/27/13  Yes Dorena Cookey, MD  HYDROcodone-acetaminophen [facility]) 7.5-325 MG per tablet Take 1 tablet by mouth every 6 (six) hours as needed. Patient taking differently: Take 1 tablet by mouth every 6 (six) hours as needed.  10/27/13  Yes Dorena Cookey, MD  lisinopril-hydrochlorothiazide [facility]) 20-25 MG per tablet Half tab daily 04/06/14  Yes Dorena Cookey, MD  metFORMIN (GLUCOPHAGE) 500 MG tablet Take 1 tablet (500 mg total) by mouth daily with breakfast. 04/06/14  Yes Dorena Cookey, MD  montelukast (SINGULAIR) 10 MG  tablet Take 1 tablet (10 mg total) by mouth at bedtime. 10/15/14  Yes Dorena Cookey, MD  Multiple Vitamins-Minerals (OCUVITE PO) Take 1 tablet by mouth daily.    Yes Historical Provider, MD  albuterol (PROVENTIL) (2.5 MG/3ML) 0.083% nebulizer solution Take 3 mLs (2.5 mg total) by nebulization every 6 (six) hours as needed for wheezing. 01/01/12 12/31/12  Dorena Cookey, MD  docusate sodium (COLACE) 100 MG capsule Take 100 mg by mouth daily as needed. For stool softner    Historical Provider, MD  glucose blood test strip 1 each by Other route 2 (two) times daily. One touch verio Dx 250.00 11/01/12   Dorena Cookey, MD  predniSONE (DELTASONE) 20 MG tablet 2 tabs x 3 days, 1 tab x 3 days, 1/2 tab x 3 days, 1/2 tab M,W,F x 2 weeks Patient not taking: Reported on 12/08/2014 07/06/14   Dorena Cookey, MD  traMADol (ULTRAM) 50 MG tablet One half to one tablet twice a day when necessary for pain Patient taking differently: Take 50 mg by mouth every 12 (twelve) hours as needed for moderate pain.  04/06/14   Dorena Cookey, MD  zolpidem (AMBIEN) 5 MG tablet TAKE ONE TABLET BY MOUTH AT BEDTIME AS NEEDED Patient taking differently: Take 5 mg by mouth at bedtime as needed for sleep.  10/15/14   Dorena Cookey, MD   BP 112/79 mmHg  Pulse 63  Temp(Src) 98 F (36.7 C) (Oral)  Resp 18  SpO2 98% Physical Exam  Constitutional: She is oriented to person, place, and time. She appears well-developed and well-nourished.  HENT:  Head: Normocephalic and atraumatic.  Nose: Nose normal.  Dry mucous membranes  Eyes: Conjunctivae and EOM are normal. Pupils are equal, round, and reactive to light.  Neck: Normal range of motion. Neck supple. No JVD present. No tracheal deviation present. No thyromegaly present.  Cardiovascular: Normal rate, regular rhythm, normal heart sounds and intact distal pulses.  Exam reveals no gallop and no friction rub.   No murmur heard. Pulmonary/Chest: Effort normal and breath sounds normal. No  stridor. No respiratory distress. She has no wheezes. She has no rales. She exhibits no tenderness.  Abdominal: Soft. She exhibits no distension and no mass. There is no tenderness. There is no rebound and no guarding.  Hyperactive bowel sounds  Musculoskeletal: Normal range of motion. She exhibits no edema or tenderness.  Lymphadenopathy:    She has no cervical adenopathy.  Neurological: She is alert and oriented to person, place, and time. She displays normal reflexes. She exhibits normal muscle tone. Coordination normal.  Skin: Skin is warm and dry. No rash noted. No erythema. No pallor.  Psychiatric: She has a normal mood and affect. Her  behavior is normal. Judgment and thought content normal.  Nursing note and vitals reviewed.   ED Course  Procedures (including critical care time) Labs Review Labs Reviewed  COMPREHENSIVE METABOLIC PANEL - Abnormal; Notable for the following:    Glucose, Bld 190 (*)    GFR calc non Af Amer 70 (*)    GFR calc Af Amer 81 (*)    All other components within normal limits  CBC WITH DIFFERENTIAL/PLATELET  URINALYSIS, ROUTINE W REFLEX MICROSCOPIC  I-STAT CG4 LACTIC ACID, ED    Imaging Review No results found.   EKG Interpretation None      MDM   Final diagnoses:  Nausea vomiting and diarrhea    69 year old female with one day of nausea, vomiting and diarrhea.  She reports fever 1.  Patient hemodynamically stable, abdomen is non-tender.  Plan for labs, fluids.  By mouth challenge.  8:10 AM Pt still unable to urinate.  To have further IV fluids.  Care passed to Dr Mingo Amber awaiting ua, PO challenge    Kalman Drape, MD 12/08/14 [phone]

## 2014-12-08 NOTE — ED Notes (Signed)
Pt. Is unable to use the restroom at this time, but she is aware that we need a urine specimen.

## 2014-12-10 DIAGNOSIS — Z1231 Encounter for screening mammogram for malignant neoplasm of breast: Secondary | ICD-10-CM | POA: Diagnosis not present

## 2014-12-10 DIAGNOSIS — Z803 Family history of malignant neoplasm of breast: Secondary | ICD-10-CM | POA: Diagnosis not present

## 2014-12-10 LAB — HM MAMMOGRAPHY: HM Mammogram: NEGATIVE

## 2014-12-16 ENCOUNTER — Encounter: Payer: Self-pay | Admitting: Family Medicine

## 2014-12-16 ENCOUNTER — Other Ambulatory Visit: Payer: Self-pay | Admitting: *Deleted

## 2015-01-04 ENCOUNTER — Telehealth: Payer: Self-pay | Admitting: *Deleted

## 2015-01-04 DIAGNOSIS — I10 Essential (primary) hypertension: Secondary | ICD-10-CM

## 2015-01-04 DIAGNOSIS — F330 Major depressive disorder, recurrent, mild: Secondary | ICD-10-CM

## 2015-01-04 DIAGNOSIS — E119 Type 2 diabetes mellitus without complications: Secondary | ICD-10-CM

## 2015-01-04 MED ORDER — CITALOPRAM HYDROBROMIDE 20 MG PO TABS
ORAL_TABLET | ORAL | Status: DC
Start: 2015-01-04 — End: 2015-04-14

## 2015-01-04 MED ORDER — METFORMIN HCL 500 MG PO TABS: 500.0000 mg | ORAL_TABLET | Freq: Every day | ORAL | Status: DC

## 2015-01-04 MED ORDER — LISINOPRIL-HYDROCHLOROTHIAZIDE 20-25 MG PO TABS: ORAL_TABLET | ORAL | Status: DC

## 2015-01-04 NOTE — Telephone Encounter (Signed)
Pt called stating she needs a refill to [facility] patient pharmacy [facility], pt is requesting metformin 500mg  and citalopram 20mg  and htcz 20-25.

## 2015-01-07 ENCOUNTER — Other Ambulatory Visit (INDEPENDENT_AMBULATORY_CARE_PROVIDER_SITE_OTHER): Payer: Medicare Other

## 2015-01-07 DIAGNOSIS — E139 Other specified diabetes mellitus without complications: Secondary | ICD-10-CM

## 2015-01-07 LAB — BASIC METABOLIC PANEL
BUN: 17 mg/dL (ref 6–23)
CO2: 32 mEq/L (ref 19–32)
Calcium: 9.9 mg/dL (ref 8.4–10.5)
Chloride: 102 mEq/L (ref 96–112)
Creatinine, Ser: 0.91 mg/dL (ref 0.40–1.20)
GFR: 65.22 mL/min (ref >60.00)
Glucose, Bld: 175 mg/dL — ABNORMAL HIGH (ref 70–99)
Potassium: 4.1 mEq/L (ref 3.5–5.1)
Sodium: 139 mEq/L (ref 135–145)

## 2015-01-07 LAB — HEMOGLOBIN A1C: HEMOGLOBIN A1C: 7.6 % — AB (ref 4.6–6.5)

## 2015-01-14 ENCOUNTER — Encounter: Payer: Self-pay | Admitting: Family Medicine

## 2015-01-14 ENCOUNTER — Ambulatory Visit (INDEPENDENT_AMBULATORY_CARE_PROVIDER_SITE_OTHER): Payer: Medicare Other | Admitting: Family Medicine

## 2015-01-14 VITALS — BP 120/80 | Temp 98.5°F

## 2015-01-14 DIAGNOSIS — G4700 Insomnia, unspecified: Secondary | ICD-10-CM | POA: Diagnosis not present

## 2015-01-14 DIAGNOSIS — E139 Other specified diabetes mellitus without complications: Secondary | ICD-10-CM | POA: Diagnosis not present

## 2015-01-14 DIAGNOSIS — E119 Type 2 diabetes mellitus without complications: Secondary | ICD-10-CM

## 2015-01-14 MED ORDER — GLUCOSE BLOOD VI STRP: 1.0000 | ORAL_STRIP | Freq: Every day | Status: DC

## 2015-01-14 MED ORDER — METFORMIN HCL 500 MG PO TABS: ORAL_TABLET | ORAL | Status: DC

## 2015-01-14 MED ORDER — LORAZEPAM 0.5 MG PO TABS: ORAL_TABLET | ORAL | Status: DC

## 2015-01-14 NOTE — Progress Notes (Signed)
   Subjective:    Patient ID: Lynn [name], female    DOB: 08/02/1946, 69 y.o.   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 Her blood sugar nonfasting is 175. She's not checking her blood sugar at home. She's been off her diet and exercise program.  She's also been on Ambien 5 mg at bedtime many years for sleep dysfunction and doesn't feel like it's working. She'll he sleeps for about 4 hours. She wants to discuss alternatives   Review of Systems Review of systems otherwise negative    Objective:   Physical Exam  Well-developed well-nourished female no acute distress vital signs stable she's afebrile BP 120/80      Assessment & Plan:  Diabetes type 2 not at goal increase metformin to 500 mg twice a day fasting blood sugar Monday Wednesday Friday resume exercise program and diet recheck A1c in May  Sleep dysfunction building up a tolerance to the Ambien........ stop the Ambien....... lorazepam 0.5 daily at bedtime

## 2015-01-14 NOTE — Progress Notes (Signed)
Pre visit review using our clinic review tool, if applicable. No additional management support is needed unless otherwise documented below in the visit note. 

## 2015-01-14 NOTE — Patient Instructions (Signed)
Increase the metformin 500 mg before breakfast and 500 mg before your evening meal  Ambien discontinue  Lorazepam 0.5...Marland KitchenMarland KitchenMarland Kitchen 1 at bedtime  Return in May for follow-up  Labs one week prior

## 2015-03-04 DIAGNOSIS — M65332 Trigger finger, left middle finger: Secondary | ICD-10-CM | POA: Diagnosis not present

## 2015-03-17 ENCOUNTER — Ambulatory Visit (INDEPENDENT_AMBULATORY_CARE_PROVIDER_SITE_OTHER): Payer: Medicare Other

## 2015-03-17 ENCOUNTER — Encounter: Payer: Self-pay | Admitting: Podiatry

## 2015-03-17 ENCOUNTER — Ambulatory Visit (INDEPENDENT_AMBULATORY_CARE_PROVIDER_SITE_OTHER): Payer: Medicare Other | Admitting: Podiatry

## 2015-03-17 VITALS — BP 121/74 | HR 75 | Resp 11 | Ht 69.5 in | Wt 199.0 lb

## 2015-03-17 DIAGNOSIS — M79671 Pain in right foot: Secondary | ICD-10-CM

## 2015-03-17 DIAGNOSIS — M779 Enthesopathy, unspecified: Secondary | ICD-10-CM | POA: Diagnosis not present

## 2015-03-17 MED ORDER — TRIAMCINOLONE ACETONIDE 10 MG/ML IJ SUSP
10.0000 mg | Freq: Once | INTRAMUSCULAR | Status: AC
  Administered 2015-03-17: 10 mg

## 2015-03-17 NOTE — Progress Notes (Signed)
   Subjective:    Patient ID: Lynn [name], female    DOB: 1946-06-07, 69 y.o.   MRN: [id number]  HPI    Review of Systems  All other systems reviewed and are negative.      Objective:   Physical Exam        Assessment & Plan:

## 2015-03-19 NOTE — Progress Notes (Signed)
Subjective:     Patient ID: Lynn [name], female   DOB: 06-23-1946, 69 y.o.   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 Patient to be seen back in 3 weeks and given instructions on wearing wedge shoe on the left

## 2015-03-22 ENCOUNTER — Other Ambulatory Visit (INDEPENDENT_AMBULATORY_CARE_PROVIDER_SITE_OTHER): Payer: Medicare Other

## 2015-03-22 DIAGNOSIS — E139 Other specified diabetes mellitus without complications: Secondary | ICD-10-CM

## 2015-03-22 LAB — BASIC METABOLIC PANEL
BUN: 22 mg/dL (ref 6–23)
CALCIUM: 9.7 mg/dL (ref 8.4–10.5)
CO2: 29 mEq/L (ref 19–32)
CREATININE: 0.85 mg/dL (ref 0.40–1.20)
Chloride: 102 mEq/L (ref 96–112)
GFR: 70.52 mL/min (ref >60.00)
Glucose, Bld: 94 mg/dL (ref 70–99)
Potassium: 4.2 mEq/L (ref 3.5–5.1)
Sodium: 137 mEq/L (ref 135–145)

## 2015-03-22 LAB — HEMOGLOBIN A1C: Hgb A1c MFr Bld: 6.6 % — ABNORMAL HIGH (ref 4.6–6.5)

## 2015-03-29 ENCOUNTER — Ambulatory Visit (INDEPENDENT_AMBULATORY_CARE_PROVIDER_SITE_OTHER): Payer: Medicare Other | Admitting: Family Medicine

## 2015-03-29 DIAGNOSIS — G4700 Insomnia, unspecified: Secondary | ICD-10-CM

## 2015-03-29 DIAGNOSIS — E139 Other specified diabetes mellitus without complications: Secondary | ICD-10-CM | POA: Diagnosis not present

## 2015-03-29 DIAGNOSIS — I10 Essential (primary) hypertension: Secondary | ICD-10-CM

## 2015-03-29 MED ORDER — LORAZEPAM 1 MG PO TABS: ORAL_TABLET | ORAL | Status: DC

## 2015-03-29 MED ORDER — LISINOPRIL-HYDROCHLOROTHIAZIDE 10-12.5 MG PO TABS: ORAL_TABLET | ORAL | Status: DC

## 2015-03-29 MED ORDER — ZOLPIDEM TARTRATE 5 MG PO TABS
5.0000 mg | ORAL_TABLET | Freq: Every evening | ORAL | Status: DC | PRN
Start: 2015-03-29 — End: 2015-04-14

## 2015-03-29 NOTE — Progress Notes (Signed)
Pre visit review using our clinic review tool, if applicable. No additional management support is needed unless otherwise documented below in the visit note. 

## 2015-03-29 NOTE — Progress Notes (Signed)
   Subjective:    Patient ID: Lynn [name], female    DOB: 02/08/1946, 69 y.o.   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 She was in the 220 range. On metformin 500 mg daily her blood sugars in the 98 range A1c down to 6.6%  On Zestoretic 20-25 her BP is 110/70. We'll cut her affective dose down to 5 mg a day since her blood pressure drops and she's lost weight.  She continues to have sleep dysfunction. The 0.5 Ativan didn't help. We discussed other options. She's artery on the Celexa some disinclined add trazodone. She did fairly well on Ambien and we'll try that or the Ativan again.  Review of Systems    review of systems otherwise negative Objective:   Physical Exam Well-developed well-nourished female no acute distress vital signs stable she's afebrile       Assessment & Plan:  Diabetes type 2 at goal.......... continue current therapy follow-up in 3 months  Hypertension........ decrease aced to 5 mg daily  Insomnia..........Marland Kitchen Ativan 1 mg......... if that doesn't work go back on the Ambien 5 mg

## 2015-03-29 NOTE — Patient Instructions (Addendum)
Zestoretic 10-12 0.5......... one half tab daily in the morning  Ativan 1 mg........... one tablet daily at bedtime.......... if after 4-6 weeks she don't see much improvement....... then go back and try the Ambien 5 mg  Continue diet exercise program  Follow-up in 3 months  Nonfasting labs one week prior

## 2015-04-06 ENCOUNTER — Other Ambulatory Visit: Payer: Self-pay | Admitting: Family Medicine

## 2015-04-06 DIAGNOSIS — E785 Hyperlipidemia, unspecified: Secondary | ICD-10-CM

## 2015-04-06 DIAGNOSIS — E139 Other specified diabetes mellitus without complications: Secondary | ICD-10-CM

## 2015-04-07 ENCOUNTER — Encounter: Payer: Self-pay | Admitting: Podiatry

## 2015-04-07 ENCOUNTER — Other Ambulatory Visit (INDEPENDENT_AMBULATORY_CARE_PROVIDER_SITE_OTHER): Payer: Medicare Other

## 2015-04-07 ENCOUNTER — Ambulatory Visit (INDEPENDENT_AMBULATORY_CARE_PROVIDER_SITE_OTHER): Payer: Medicare Other | Admitting: Podiatry

## 2015-04-07 VITALS — BP 123/69 | HR 73 | Resp 12

## 2015-04-07 DIAGNOSIS — E785 Hyperlipidemia, unspecified: Secondary | ICD-10-CM | POA: Diagnosis not present

## 2015-04-07 DIAGNOSIS — E139 Other specified diabetes mellitus without complications: Secondary | ICD-10-CM | POA: Diagnosis not present

## 2015-04-07 DIAGNOSIS — M779 Enthesopathy, unspecified: Secondary | ICD-10-CM

## 2015-04-07 LAB — CBC WITH DIFFERENTIAL/PLATELET
Basophils Absolute: 0 10*3/uL (ref 0.0–0.1)
Basophils Relative: 0.5 % (ref 0.0–3.0)
Eosinophils Absolute: 0.3 10*3/uL (ref 0.0–0.7)
Eosinophils Relative: 3.1 % (ref 0.0–5.0)
HCT: 46.6 % — ABNORMAL HIGH (ref 36.0–46.0)
Hemoglobin: 15.1 g/dL — ABNORMAL HIGH (ref 12.0–15.0)
Lymphocytes Relative: 24.5 % (ref 12.0–46.0)
Lymphs Abs: 2.2 10*3/uL (ref 0.7–4.0)
MCHC: 32.4 g/dL (ref 30.0–36.0)
MCV: 90.2 fl (ref 78.0–100.0)
Monocytes Absolute: 0.6 10*3/uL (ref 0.1–1.0)
Monocytes Relative: 6.7 % (ref 3.0–12.0)
Neutro Abs: 5.8 10*3/uL (ref 1.4–7.7)
Neutrophils Relative %: 65.2 % (ref 43.0–77.0)
Platelets: 193 10*3/uL (ref 150.0–400.0)
RBC: 5.17 Mil/uL — ABNORMAL HIGH (ref 3.87–5.11)
RDW: 14.5 % (ref 11.5–15.5)
WBC: 8.9 10*3/uL (ref 4.0–10.5)

## 2015-04-07 LAB — POCT URINALYSIS DIPSTICK
Bilirubin, UA: NEGATIVE
Blood, UA: NEGATIVE
Glucose, UA: NEGATIVE
Ketones, UA: NEGATIVE
Leukocytes, UA: NEGATIVE
Nitrite, UA: NEGATIVE
Protein, UA: NEGATIVE
Spec Grav, UA: 1.01
Urobilinogen, UA: 0.2
pH, UA: 6

## 2015-04-07 LAB — BASIC METABOLIC PANEL
BUN: 21 mg/dL (ref 6–23)
CO2: 29 mEq/L (ref 19–32)
CREATININE: 0.94 mg/dL (ref 0.40–1.20)
Calcium: 10 mg/dL (ref 8.4–10.5)
Chloride: 99 mEq/L (ref 96–112)
GFR: 62.78 mL/min (ref >60.00)
Glucose, Bld: 100 mg/dL — ABNORMAL HIGH (ref 70–99)
POTASSIUM: 4 meq/L (ref 3.5–5.1)
SODIUM: 135 meq/L (ref 135–145)

## 2015-04-07 LAB — HEPATIC FUNCTION PANEL
ALT: 13 U/L (ref 0–35)
AST: 20 U/L (ref 0–37)
Albumin: 4.3 g/dL (ref 3.5–5.2)
Alkaline Phosphatase: 90 U/L (ref 39–117)
Bilirubin, Direct: 0.1 mg/dL (ref 0.0–0.3)
Total Bilirubin: 0.6 mg/dL (ref 0.2–1.2)
Total Protein: 6.6 g/dL (ref 6.0–8.3)

## 2015-04-07 LAB — LIPID PANEL
Cholesterol: 147 mg/dL (ref 0–200)
HDL: 60.5 mg/dL (ref >39.00)
LDL Cholesterol: 68 mg/dL (ref 0–99)
NonHDL: 86.5
Total CHOL/HDL Ratio: 2
Triglycerides: 94 mg/dL (ref 0.0–149.0)
VLDL: 18.8 mg/dL (ref 0.0–40.0)

## 2015-04-07 LAB — TSH: TSH: 2.16 u[IU]/mL (ref 0.35–4.50)

## 2015-04-07 LAB — HEMOGLOBIN A1C: Hgb A1c MFr Bld: 6.3 % (ref 4.6–6.5)

## 2015-04-07 NOTE — Progress Notes (Signed)
   Subjective:    Patient ID: Lynn [name], female    DOB: 21-Aug-1946, 69 y.o.   MRN: [id number]  HPI  Pain has improved since last visit. Still has discomfort in original areas (R/Lateral)   Review of Systems     Objective:   Physical Exam        Assessment & Plan:

## 2015-04-07 NOTE — Progress Notes (Signed)
Subjective:     Patient ID: Lynn [name], female   DOB: 09-20-1946, 69 y.o.   MRN: [id number]  HPI patient presents with pain in the outside of the right foot that has moderated quite a bit   Review of Systems     Objective:   Physical Exam Neurovascular status intact muscle strength adequate with significant diminishment of discomfort lateral side right foot with diminished inflammation and fluid buildup noted    Assessment:     Improving tendinitis right    Plan:     Instructed on physical therapy and inflammatory and continue supportive shoe gear usage. Reappoint to recheck

## 2015-04-13 DIAGNOSIS — H5203 Hypermetropia, bilateral: Secondary | ICD-10-CM | POA: Diagnosis not present

## 2015-04-13 DIAGNOSIS — H524 Presbyopia: Secondary | ICD-10-CM | POA: Diagnosis not present

## 2015-04-13 DIAGNOSIS — H2513 Age-related nuclear cataract, bilateral: Secondary | ICD-10-CM | POA: Diagnosis not present

## 2015-04-13 DIAGNOSIS — H3531 Nonexudative age-related macular degeneration: Secondary | ICD-10-CM | POA: Diagnosis not present

## 2015-04-13 LAB — HM DIABETES EYE EXAM

## 2015-04-14 ENCOUNTER — Encounter: Payer: Self-pay | Admitting: Adult Health

## 2015-04-14 ENCOUNTER — Ambulatory Visit (INDEPENDENT_AMBULATORY_CARE_PROVIDER_SITE_OTHER): Payer: Medicare Other | Admitting: Adult Health

## 2015-04-14 VITALS — BP 120/78 | Temp 98.8°F | Ht 68.25 in | Wt 195.5 lb

## 2015-04-14 DIAGNOSIS — F330 Major depressive disorder, recurrent, mild: Secondary | ICD-10-CM | POA: Diagnosis not present

## 2015-04-14 DIAGNOSIS — I10 Essential (primary) hypertension: Secondary | ICD-10-CM | POA: Diagnosis not present

## 2015-04-14 DIAGNOSIS — J309 Allergic rhinitis, unspecified: Secondary | ICD-10-CM | POA: Diagnosis not present

## 2015-04-14 DIAGNOSIS — E785 Hyperlipidemia, unspecified: Secondary | ICD-10-CM

## 2015-04-14 DIAGNOSIS — Z0000 Encounter for general adult medical examination without abnormal findings: Secondary | ICD-10-CM | POA: Insufficient documentation

## 2015-04-14 MED ORDER — LISINOPRIL-HYDROCHLOROTHIAZIDE 20-25 MG PO TABS
ORAL_TABLET | ORAL | Status: DC
Start: 2015-04-14 — End: 2015-07-20

## 2015-04-14 MED ORDER — METFORMIN HCL 500 MG PO TABS: 500.0000 mg | ORAL_TABLET | Freq: Every day | ORAL | Status: DC

## 2015-04-14 MED ORDER — CITALOPRAM HYDROBROMIDE 20 MG PO TABS
ORAL_TABLET | ORAL | Status: DC
Start: 2015-04-14 — End: 2016-03-14

## 2015-04-14 MED ORDER — ALBUTEROL SULFATE (2.5 MG/3ML) 0.083% IN NEBU: 2.5000 mg | INHALATION_SOLUTION | Freq: Four times a day (QID) | RESPIRATORY_TRACT | Status: DC | PRN

## 2015-04-14 MED ORDER — LORAZEPAM 0.5 MG PO TABS: ORAL_TABLET | ORAL | Status: DC

## 2015-04-14 MED ORDER — MONTELUKAST SODIUM 10 MG PO TABS: 10.0000 mg | ORAL_TABLET | Freq: Every day | ORAL | Status: DC

## 2015-04-14 MED ORDER — LORAZEPAM 1 MG PO TABS: ORAL_TABLET | ORAL | Status: DC

## 2015-04-14 MED ORDER — FLUTICASONE PROPIONATE 50 MCG/ACT NA SUSP
NASAL | Status: DC
Start: 2015-04-14 — End: 2016-03-14

## 2015-04-14 NOTE — Progress Notes (Addendum)
Subjective:  Patient presents today for their annual wellness visit. 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 Metformin 500 mg daily for diabetes A1c 6.3.   She gets routine eye care, does not go to the dentist due to money and insurance, gets annual mammography but does not do breast exam monthly. Her mother had breast cancer in her late 47s. Advised to do BSE monthly. Colonoscopy 1 year ago-  Normal, she is followed every 5 years because she has a history of colon polyps.  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  Vaccinations up-to-date   Cognitive function normal she goes to walk almost every day for water aerobics, home health safety reviewed no issues identified, no guns in the house, she does have a health care power of attorney and living will   Preventive Screening-Counseling & Management  Smoking Status: Former Smoker, quit in 2000 Second Hand Smoking status: No smokers in home  Risk Factors Regular exercise: Does water aerobics and walks seven days a week Diet: Follows diabetic diet Fall Risk: None. She did have one fall this year. She endorses she tripped over an object on the floor when she was reaching down to pick it up. She fell into her chair, denies any injuries.   Cardiac risk factors:  advanced age (older than 9 for men, 14 for women) Yes Hyperlipidemia Yes Diabetes. Yes Family History: Yes  Depression Screen None. PHQ2 0  Activities of Daily Living Independent ADLs and IADLs -  Yes  Hearing Difficulties: -Has chronic ringing and right ear with hearing loss. Seen by audiologist  Cognitive Testing No reported trouble.   Normal 3 word recall  List the Names of Other Physician/Practitioners you currently use: 1.Dr. Paulla Dolly - Foot specialist 2. Optho Jason Fila) - 04/14/2015 3. Valetta Mole - ENT  Immunization History  Administered Date(s) Administered  . Influenza Split 08/26/2012  . Influenza Whole 11/06/2004, 08/11/2009  . Influenza,inj,Quad PF,36+ Mos 07/29/2013, 07/06/2014  . Pneumococcal Conjugate-13 04/06/2014  . Pneumococcal Polysaccharide-23 11/06/2004, 04/02/2013  . Td 11/06/2002, 06/21/2010  . Zoster 03/18/2008   Required Immunizations needed today None  Screening tests- up to date Health Maintenance Due  Topic Date Due  . URINE MICROALBUMIN  03/25/2015    ROS- No pertinent positives discovered in course of AWV  The following were reviewed and entered/updated in epic: Past Medical History  Diagnosis Date  . DIABETES MELLITUS, TYPE II 03/14/2007  . HYPERLIPIDEMIA 06/21/2010  . DEGENERATION, MACULAR NOS 03/14/2007  . Unspecified hearing loss 01/13/2010  . HYPERTENSION 03/14/2007  . DIVERTICULOSIS OF COLON 01/11/2009  . [place] DISEASE, LUMBAR 05/25/2009  . Diarrhea 05/31/2007  . 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 VERTIGO 11/10/2010  . SEBORRHEIC KERATOSIS, INFLAMED 12/02/2010  . ASTHMA 03/14/2007    NO PROBELEM IN 12 YRS  . DEPRESSION 03/14/2007  . Lung abnormality     LT LUNG WITH SPOT HAS BEEN FOLLOWED X 3 YRS DR Arlyce Dice  . Blood dyscrasia  VON [place] "FREE BLEEDER"   Patient Active Problem List   Diagnosis Date Noted  . Insomnia 01/14/2015  . Chronic pain in right foot 07/06/2014  . Allergic rhinitis 10/13/2013  . SEBORRHEIC KERATOSIS, INFLAMED 12/02/2010  . Hyperlipidemia 06/21/2010  . UNSPECIFIED HEARING LOSS 01/13/2010  . [place] DISEASE, LUMBAR 05/25/2009  . DIVERTICULOSIS OF COLON 01/11/2009  . COLONIC POLYPS,  ADENOMATOUS, 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  . DIARRHEA 05/31/2007  . 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 DEPRESSION 03/14/2007  . DEGENERATION, MACULAR NOS 03/14/2007  . Essential hypertension 03/14/2007  . Asthma 03/14/2007  . COPD 03/14/2007   Past Surgical History  Procedure Laterality Date  . Cholecystectomy  1982    urosespsis post op  . Tonsillectomy  1952  . Spine surgery  1996    fusion, ruptured disk  . Knee arthroscopy Right 1995    right, TNR  . Joint replacement  [place], REPLACED JOINT  . Left shoulder Left 2012    Family History  Problem Relation Age of Onset  . Heart disease Other   . Diabetes Other     diabeties  . Hyperlipidemia Other   . Hypertension Other   . Cancer Mother   . Diabetes Mother   . Heart disease Mother   . Hyperlipidemia Mother   . Hypertension Mother   . Hyperlipidemia Father   . Hypertension Father   . Colon cancer Maternal Aunt 60  . Colon cancer Maternal Uncle 82  . Colon cancer Maternal Uncle 80    Medications- reviewed and updated Current Outpatient Prescriptions  Medication Sig Dispense Refill  . acetaminophen (TYLENOL) 325 MG tablet Take 650 mg by mouth every 6 (six) hours as needed for moderate pain.    Marland Kitchen aspirin 81 MG tablet Take 81 mg by mouth daily.      Marland Kitchen atorvastatin (LIPITOR) 40 MG tablet Take 1 tablet (40 mg total) by mouth daily. 100 tablet 3  . citalopram (CELEXA) 20 MG tablet 1 by mouth each bedtime 90 tablet 3  . docusate sodium (COLACE) 100 MG capsule Take 100 mg by mouth daily as needed. For stool softner    . fluticasone (FLONASE) 50 MCG/ACT nasal spray 1 spray up each nostril  at bedtime 16 g 6  . glucose blood test strip 1 each by Other route daily. 100 each 12  . lisinopril-hydrochlorothiazide (PRINZIDE,ZESTORETIC) 10-12.5 MG per tablet One half tab every morning (Patient taking differently: 1/4 tab every morning) 90 tablet 3  . lisinopril-hydrochlorothiazide (PRINZIDE,ZESTORETIC)  20-25 MG per tablet Half tab daily 100 tablet 6  . LORazepam (ATIVAN) 0.5 MG tablet 1 by mouth daily at bedtime 30 tablet 0  . LORazepam (ATIVAN) 1 MG tablet One by mouth daily at bedtime for sleep 30 tablet 0  . metFORMIN (GLUCOPHAGE) 500 MG tablet Take 1 tablet (500 mg total) by mouth daily with breakfast. 90 tablet 3  . Multiple Vitamins-Minerals (OCUVITE PO) Take 1 tablet by mouth daily.     Marland Kitchen albuterol (PROVENTIL) (2.5 MG/3ML) 0.083% nebulizer solution Take 3 mLs (2.5 mg total) by nebulization every 6 (six) hours as needed for wheezing. 3 mL 11  . montelukast (SINGULAIR) 10 MG tablet Take 1 tablet (10 mg total) by mouth at bedtime. 90 tablet 3   No current facility-administered medications for this visit.    Allergies-reviewed and updated Allergies  Allergen Reactions  . Morphine And Related Itching    Patient states that she  can still take this medication it just makes her itch.    History   Social History  . Marital Status: Single    Spouse Name: N/A  . Number of Children: N/A  . Years of Education: N/A   Social History Main Topics  . Smoking status: Former Smoker -- 2.00 packs/day for 37 years    Types: Cigarettes    Quit date: 09/19/1999  . Smokeless tobacco: Never Used  . Alcohol Use: Yes     Comment: rare  . Drug Use: No  . Sexual Activity: Not on file   Other Topics Concern  . None   Social History Narrative    Objective: BP 120/78 mmHg  Temp(Src) 98.8 F (37.1 C) (Oral)  Ht 5' 8.25" (1.734 m)  Wt 195 lb 8 oz (88.678 kg)  BMI 29.49 kg/m2 Gen: NAD, resting comfortably HEENT: Mucous membranes are moist. Oropharynx normal. Conjunctiva clear. No EOM. Wearing glasses Neck: no thyromegaly CV: RRR no murmurs rubs or gallops. No carotid bruit Lungs: CTAB no crackles, wheeze, rhonchi Abdomen: soft/nontender/nondistended/normal bowel sounds. No rebound or guarding.  Breast: Deferred by patient Ext: no edema Skin: warm, dry Neuro: grossly normal, moves all  extremities, PERRLA  Assessment/Plan:  1. Major depressive disorder, recurrent episode, mild - citalopram (CELEXA) 20 MG tablet; 1 by mouth each bedtime  Dispense: 90 tablet; Refill: 3  2. Essential hypertension - Well controlled with current regimen and weight loss.  - lisinopril-hydrochlorothiazide (PRINZIDE,ZESTORETIC) 20-25 MG per tablet; Half tab daily  Dispense: 100 tablet; Refill: 6  3. Allergic rhinitis, unspecified allergic rhinitis type - fluticasone (FLONASE) 50 MCG/ACT nasal spray; 1 spray up each nostril  at bedtime  Dispense: 16 g; Refill: 6  4. Hyperlipidemia - Has lost 34 pounds with diet and exercise - Decrease Lipitor to 20 mg.  - Will check Lipids in 6 months.   5. Health care maintenance - Continue with diabetic diet - Continue to exercise daily  - Follow up as needed   Return precautions advised.   No orders of the defined types were placed in this encounter.    Meds ordered this encounter  Medications  . citalopram (CELEXA) 20 MG tablet    Sig: 1 by mouth each bedtime    Dispense:  90 tablet    Refill:  3  . lisinopril-hydrochlorothiazide (PRINZIDE,ZESTORETIC) 20-25 MG per tablet    Sig: Half tab daily    Dispense:  100 tablet    Refill:  6  . fluticasone (FLONASE) 50 MCG/ACT nasal spray    Sig: 1 spray up each nostril  at bedtime    Dispense:  16 g    Refill:  6  . metFORMIN (GLUCOPHAGE) 500 MG tablet    Sig: Take 1 tablet (500 mg total) by mouth daily with breakfast.    Dispense:  90 tablet    Refill:  3  . albuterol (PROVENTIL) (2.5 MG/3ML) 0.083% nebulizer solution    Sig: Take 3 mLs (2.5 mg total) by nebulization every 6 (six) hours as needed for wheezing.    Dispense:  3 mL    Refill:  11  . montelukast (SINGULAIR) 10 MG tablet    Sig: Take 1 tablet (10 mg total) by mouth at bedtime.    Dispense:  90 tablet    Refill:  3  . LORazepam (ATIVAN) 1 MG tablet    Sig: One by mouth daily at bedtime for sleep    Dispense:  30 tablet     Refill:  0  . LORazepam (ATIVAN) 0.5 MG tablet    Sig: 1 by mouth daily at bedtime    Dispense:  30 tablet    Refill:  0

## 2015-04-14 NOTE — Patient Instructions (Addendum)
  Lynn [name] , Thank you for taking time to come for your Medicare Wellness Visit. I appreciate your ongoing commitment to your health goals. Please review the following plan we discussed and let me know if I can assist you in the future.   Reduce the Lipitor to 20 mg and we will see how it does.   These are the goals we discussed: Goals    . Eat more fruits and vegetables    . Prevent Falls       This is a list of the screening recommended for you and due dates:  Health Maintenance  Topic Date Due  . Urine Protein Check  03/25/2015  . Eye exam for diabetics  04/29/2015  . Flu Shot  06/07/2015  . Hemoglobin A1C  10/07/2015  . Complete foot exam   03/28/2016  . Mammogram  12/10/2016  . Colon Cancer Screening  04/23/2019  . Tetanus Vaccine  06/21/2020  . DEXA scan (bone density measurement)  Completed  . Shingles Vaccine  Completed  . Pneumonia vaccines  Completed

## 2015-04-14 NOTE — Progress Notes (Signed)
Pre visit review using our clinic review tool, if applicable. No additional management support is needed unless otherwise documented below in the visit note. 

## 2015-04-15 ENCOUNTER — Encounter: Payer: Self-pay | Admitting: Family Medicine

## 2015-04-21 ENCOUNTER — Telehealth: Payer: Self-pay

## 2015-04-21 ENCOUNTER — Other Ambulatory Visit: Payer: Self-pay | Admitting: Adult Health

## 2015-04-21 MED ORDER — DIPHENOXYLATE-ATROPINE 2.5-0.025 MG PO TABS: 1.0000 | ORAL_TABLET | Freq: Three times a day (TID) | ORAL | Status: DC | PRN

## 2015-04-21 NOTE — Telephone Encounter (Signed)
That is fine. Take 1 pill three times a day as needed, 30 pills, no refills. . If symptoms last longer than 5 days then she needs to come in. Continue to stay hydrated.

## 2015-04-21 NOTE — Telephone Encounter (Signed)
Called and spoke with pt and pt is aware. Advised pt to stay hydrated and follow the BRAT diet.  Pt verbalized understanding and request RX be sent to [place].

## 2015-04-21 NOTE — Telephone Encounter (Signed)
Pt left a vm stating she thinks she has a virus.  She has had diarrhea for 3 days with a low grade fever.  Pt states she has been taking OTC medication for the diarrhea but is not working.  Pt would like Lomotil called in to the pharmacy.

## 2015-05-03 ENCOUNTER — Other Ambulatory Visit: Payer: Self-pay

## 2015-05-03 ENCOUNTER — Other Ambulatory Visit: Payer: Self-pay | Admitting: Family Medicine

## 2015-05-07 ENCOUNTER — Ambulatory Visit (INDEPENDENT_AMBULATORY_CARE_PROVIDER_SITE_OTHER): Payer: Medicare Other | Admitting: Podiatry

## 2015-05-07 ENCOUNTER — Ambulatory Visit (INDEPENDENT_AMBULATORY_CARE_PROVIDER_SITE_OTHER): Payer: Medicare Other

## 2015-05-07 VITALS — BP 117/73 | HR 77 | Resp 13

## 2015-05-07 DIAGNOSIS — M779 Enthesopathy, unspecified: Secondary | ICD-10-CM | POA: Diagnosis not present

## 2015-05-07 DIAGNOSIS — T148XXA Other injury of unspecified body region, initial encounter: Secondary | ICD-10-CM

## 2015-05-07 DIAGNOSIS — T148 Other injury of unspecified body region: Secondary | ICD-10-CM

## 2015-05-09 NOTE — Progress Notes (Signed)
Subjective:     Patient ID: Lynn [name], female   DOB: 12/16/1945, 69 y.o.   MRN: [id number]  HPI patient presents stating she twisted her right foot felt a pop and has discomfort in the outside. She admitted that she injured this secondary to a fall   Review of Systems     Objective:   Physical Exam Neurovascular status intact muscle strength adequate with inflammation on the lateral side of the right foot around the peroneal base and proximal. It was very difficult for me to evaluate the peroneal tendon due to her splinting because of pain but it does appear to be intact currently and appears to be just an inflammatory complex    Assessment:     Most likely an inflammatory complex of the peroneal tendon secondary to injury with possibility of rupture that is present    Plan:     I reviewed this condition with her and she is wearing an air fracture walker which she will continue to wear for the next several weeks. If symptoms persist or she seems to have any issue with ambulation I want to reevaluate her and we will need to consider MRI to evaluate whether not there is been a tear of the peroneal tendon. At this point does not appear to be that way but we'll need to be evaluated if symptoms persist or if she is having trouble with ambulation

## 2015-05-24 ENCOUNTER — Encounter: Payer: Self-pay | Admitting: Internal Medicine

## 2015-06-04 ENCOUNTER — Telehealth: Payer: Self-pay | Admitting: Family Medicine

## 2015-06-04 NOTE — Telephone Encounter (Signed)
Pt would like new glucometer accu-chek . Pt is aware ok to pick up today per [facility]

## 2015-06-15 ENCOUNTER — Emergency Department (HOSPITAL_BASED_OUTPATIENT_CLINIC_OR_DEPARTMENT_OTHER)
Admission: EM | Admit: 2015-06-15 | Discharge: 2015-06-15 | Disposition: A | Discharge status: Home / Self Care | Payer: Medicare Other | Attending: Physician Assistant | Admitting: Physician Assistant

## 2015-06-15 ENCOUNTER — Emergency Department (HOSPITAL_BASED_OUTPATIENT_CLINIC_OR_DEPARTMENT_OTHER): Payer: Medicare Other

## 2015-06-15 ENCOUNTER — Encounter (HOSPITAL_BASED_OUTPATIENT_CLINIC_OR_DEPARTMENT_OTHER): Payer: Self-pay

## 2015-06-15 DIAGNOSIS — S0181XA Laceration without foreign body of other part of head, initial encounter: Secondary | ICD-10-CM | POA: Diagnosis not present

## 2015-06-15 DIAGNOSIS — Y9289 Other specified places as the place of occurrence of the external cause: Secondary | ICD-10-CM | POA: Insufficient documentation

## 2015-06-15 DIAGNOSIS — M25562 Pain in left knee: Secondary | ICD-10-CM | POA: Diagnosis not present

## 2015-06-15 DIAGNOSIS — Z862 Personal history of diseases of the blood and blood-forming organs and certain disorders involving the immune mechanism: Secondary | ICD-10-CM | POA: Diagnosis not present

## 2015-06-15 DIAGNOSIS — Z7982 Long term (current) use of aspirin: Secondary | ICD-10-CM | POA: Insufficient documentation

## 2015-06-15 DIAGNOSIS — Z23 Encounter for immunization: Secondary | ICD-10-CM | POA: Insufficient documentation

## 2015-06-15 DIAGNOSIS — Z87891 Personal history of nicotine dependence: Secondary | ICD-10-CM | POA: Diagnosis not present

## 2015-06-15 DIAGNOSIS — Z79899 Other long term (current) drug therapy: Secondary | ICD-10-CM | POA: Insufficient documentation

## 2015-06-15 DIAGNOSIS — S8992XA Unspecified injury of left lower leg, initial encounter: Secondary | ICD-10-CM | POA: Diagnosis not present

## 2015-06-15 DIAGNOSIS — S0512XA Contusion of eyeball and orbital tissues, left eye, initial encounter: Secondary | ICD-10-CM | POA: Insufficient documentation

## 2015-06-15 DIAGNOSIS — J45909 Unspecified asthma, uncomplicated: Secondary | ICD-10-CM | POA: Insufficient documentation

## 2015-06-15 DIAGNOSIS — S43402A Unspecified sprain of left shoulder joint, initial encounter: Secondary | ICD-10-CM | POA: Diagnosis not present

## 2015-06-15 DIAGNOSIS — H9190 Unspecified hearing loss, unspecified ear: Secondary | ICD-10-CM | POA: Insufficient documentation

## 2015-06-15 DIAGNOSIS — Z86010 Personal history of colonic polyps: Secondary | ICD-10-CM | POA: Diagnosis not present

## 2015-06-15 DIAGNOSIS — Z872 Personal history of diseases of the skin and subcutaneous tissue: Secondary | ICD-10-CM | POA: Diagnosis not present

## 2015-06-15 DIAGNOSIS — Z8719 Personal history of other diseases of the digestive system: Secondary | ICD-10-CM | POA: Insufficient documentation

## 2015-06-15 DIAGNOSIS — S1093XA Contusion of unspecified part of neck, initial encounter: Secondary | ICD-10-CM | POA: Diagnosis not present

## 2015-06-15 DIAGNOSIS — E785 Hyperlipidemia, unspecified: Secondary | ICD-10-CM | POA: Insufficient documentation

## 2015-06-15 DIAGNOSIS — Z7951 Long term (current) use of inhaled steroids: Secondary | ICD-10-CM | POA: Insufficient documentation

## 2015-06-15 DIAGNOSIS — S8392XA Sprain of unspecified site of left knee, initial encounter: Secondary | ICD-10-CM | POA: Diagnosis not present

## 2015-06-15 DIAGNOSIS — E119 Type 2 diabetes mellitus without complications: Secondary | ICD-10-CM | POA: Diagnosis not present

## 2015-06-15 DIAGNOSIS — Y998 Other external cause status: Secondary | ICD-10-CM | POA: Diagnosis not present

## 2015-06-15 DIAGNOSIS — S0542XA Penetrating wound of orbit with or without foreign body, left eye, initial encounter: Secondary | ICD-10-CM | POA: Diagnosis present

## 2015-06-15 DIAGNOSIS — F329 Major depressive disorder, single episode, unspecified: Secondary | ICD-10-CM | POA: Insufficient documentation

## 2015-06-15 DIAGNOSIS — I10 Essential (primary) hypertension: Secondary | ICD-10-CM | POA: Insufficient documentation

## 2015-06-15 DIAGNOSIS — S0012XA Contusion of left eyelid and periocular area, initial encounter: Secondary | ICD-10-CM

## 2015-06-15 DIAGNOSIS — W1839XA Other fall on same level, initial encounter: Secondary | ICD-10-CM | POA: Insufficient documentation

## 2015-06-15 DIAGNOSIS — Y9301 Activity, walking, marching and hiking: Secondary | ICD-10-CM | POA: Diagnosis not present

## 2015-06-15 DIAGNOSIS — Z8739 Personal history of other diseases of the musculoskeletal system and connective tissue: Secondary | ICD-10-CM | POA: Insufficient documentation

## 2015-06-15 DIAGNOSIS — W19XXXA Unspecified fall, initial encounter: Secondary | ICD-10-CM

## 2015-06-15 DIAGNOSIS — S0083XA Contusion of other part of head, initial encounter: Secondary | ICD-10-CM | POA: Diagnosis not present

## 2015-06-15 DIAGNOSIS — S4992XA Unspecified injury of left shoulder and upper arm, initial encounter: Secondary | ICD-10-CM | POA: Diagnosis not present

## 2015-06-15 MED ORDER — LIDOCAINE-EPINEPHRINE-TETRACAINE (LET) SOLUTION
3.0000 mL | Freq: Once | NASAL | Status: AC
  Administered 2015-06-15: 3 mL via TOPICAL
  Filled 2015-06-15: qty 3

## 2015-06-15 MED ORDER — HYDROCODONE-ACETAMINOPHEN 5-325 MG PO TABS: ORAL_TABLET | ORAL | Status: DC

## 2015-06-15 MED ORDER — LIDOCAINE-EPINEPHRINE (PF) 2 %-1:200000 IJ SOLN
10.0000 mL | Freq: Once | INTRAMUSCULAR | Status: AC
  Administered 2015-06-15: 10 mL
  Filled 2015-06-15: qty 10

## 2015-06-15 MED ORDER — ACETAMINOPHEN 325 MG PO TABS
650.0000 mg | ORAL_TABLET | Freq: Once | ORAL | Status: AC
  Administered 2015-06-15: 650 mg via ORAL
  Filled 2015-06-15: qty 2

## 2015-06-15 MED ORDER — TETANUS-DIPHTH-ACELL PERTUSSIS 5-2.5-18.5 LF-MCG/0.5 IM SUSP
0.5000 mL | Freq: Once | INTRAMUSCULAR | Status: AC
  Administered 2015-06-15: 0.5 mL via INTRAMUSCULAR
  Filled 2015-06-15: qty 0.5

## 2015-06-15 NOTE — Discharge Instructions (Signed)
Keep wound dry and do not remove dressing for 24 hours if possible. After that, wash gently morning and night (every 12 hours) with soap and water. Use a topical antibiotic ointment and cover with a bandaid or gauze.    Do NOT use rubbing alcohol or hydrogen peroxide, do not soak the area   Present to your primary care doctor or the urgent care of your choice, or the ED for suture removal in 7 days.   Every attempt was made to remove foreign body (contaminants) from the wound.  However, there is always a chance that some may remain in the wound. This can  increase your risk of infection.   If you see signs of infection (warmth, redness, tenderness, pus, sharp increase in pain, fever, red streaking in the skin) immediately return to the emergency department.   After the wound heals fully, apply sunscreen for 6-12 months to minimize scarring.   Facial Laceration  A facial laceration is a cut on the face. These injuries can be painful and cause bleeding. Lacerations usually heal quickly, but they need special care to reduce scarring. DIAGNOSIS  Your health care provider will take a medical history, ask for details about how the injury occurred, and examine the wound to determine how deep the cut is. TREATMENT  Some facial lacerations may not require closure. Others may not be able to be closed because of an increased risk of infection. The risk of infection and the chance for successful closure will depend on various factors, including the amount of time since the injury occurred. The wound may be cleaned to help prevent infection. If closure is appropriate, pain medicines may be given if needed. Your health care provider will use stitches (sutures), wound glue (adhesive), or skin adhesive strips to repair the laceration. These tools bring the skin edges together to allow for faster healing and a better cosmetic outcome. If needed, you may also be given a tetanus shot. HOME CARE INSTRUCTIONS  Only  take over-the-counter or prescription medicines as directed by your health care provider.  Follow your health care provider's instructions for wound care. These instructions will vary depending on the technique used for closing the wound. For Sutures:  Keep the wound clean and dry.   If you were given a bandage (dressing), you should change it at least once a day. Also change the dressing if it becomes wet or dirty, or as directed by your health care provider.   Wash the wound with soap and water 2 times a day. Rinse the wound off with water to remove all soap. Pat the wound dry with a clean towel.   After cleaning, apply a thin layer of the antibiotic ointment recommended by your health care provider. This will help prevent infection and keep the dressing from sticking.   You may shower as usual after the first 24 hours. Do not soak the wound in water until the sutures are removed.   Get your sutures removed as directed by your health care provider. With facial lacerations, sutures should usually be taken out after 4-5 days to avoid stitch marks.   Wait a few days after your sutures are removed before applying any makeup. For Skin Adhesive Strips:  Keep the wound clean and dry.   Do not get the skin adhesive strips wet. You may bathe carefully, using caution to keep the wound dry.   If the wound gets wet, pat it dry with a clean towel.   Skin adhesive  strips will fall off on their own. You may trim the strips as the wound heals. Do not remove skin adhesive strips that are still stuck to the wound. They will fall off in time.  For Wound Adhesive:  You may briefly wet your wound in the shower or bath. Do not soak or scrub the wound. Do not swim. Avoid periods of heavy sweating until the skin adhesive has fallen off on its own. After showering or bathing, gently pat the wound dry with a clean towel.   Do not apply liquid medicine, cream medicine, ointment medicine, or makeup  to your wound while the skin adhesive is in place. This may loosen the film before your wound is healed.   If a dressing is placed over the wound, be careful not to apply tape directly over the skin adhesive. This may cause the adhesive to be pulled off before the wound is healed.   Avoid prolonged exposure to sunlight or tanning lamps while the skin adhesive is in place.  The skin adhesive will usually remain in place for 5-10 days, then naturally fall off the skin. Do not pick at the adhesive film.  After Healing: Once the wound has healed, cover the wound with sunscreen during the day for 1 full year. This can help minimize scarring. Exposure to ultraviolet light in the first year will darken the scar. It can take 1-2 years for the scar to lose its redness and to heal completely.  SEEK IMMEDIATE MEDICAL CARE IF:  You have redness, pain, or swelling around the wound.   You see ayellowish-white fluid (pus) coming from the wound.   You have chills or a fever.  MAKE SURE YOU:  Understand these instructions.  Will watch your condition.  Will get help right away if you are not doing well or get worse. Document Released: 11/30/2004 Document Revised: 08/13/2013 Document Reviewed: 06/05/2013 [facility] Patient Information 2015 [place], [place]. This information is not intended to replace advice given to you by your health care provider. Make sure you discuss any questions you have with your health care provider.

## 2015-06-15 NOTE — ED Notes (Signed)
PA at bedside for wound repair.

## 2015-06-15 NOTE — ED Notes (Signed)
Pt currently at xray and ct scan.  Suture tray has been placed at bedside per tech.

## 2015-06-15 NOTE — ED Provider Notes (Signed)
CSN: [id number]     Arrival date & time 06/15/15  1229 History   First MD Initiated Contact with Patient 06/15/15 1241     Chief Complaint  Patient presents with  . Fall     (Consider location/radiation/quality/duration/timing/severity/associated sxs/prior Treatment) HPI  Blood pressure 127/72, pulse 87, temperature 99.1 F (37.3 C), temperature source Oral, resp. rate 18, height 5\' 8"  (1.727 m), weight 188 lb (85.276 kg), SpO2 98 %.  Lynn [name] is a 69 y.o. female complaining of left periorbital laceration and bruising status post fall. Patient was walking the dog which was pulling to get another dog, the dog leash and patient fell backwards onto her left side onto the asphalt. She also has pain in her left shoulder and left knee. She's not anticoagulated, no loss of consciousness, nausea, vomiting, change in vision, cervicalgia, numbness, weakness, chest pain, shortness of breath, difficulty ambulating. On review of systems she notes a moderate left knee and left shoulder pain. No pain medication taken prior to arrival. She states that she needs an update on her tetanus shot in the next 2 months  Past Medical History  Diagnosis Date  . DIABETES MELLITUS, TYPE II 03/14/2007  . HYPERLIPIDEMIA 06/21/2010  . DEGENERATION, MACULAR NOS 03/14/2007  . Unspecified hearing loss 01/13/2010  . HYPERTENSION 03/14/2007  . DIVERTICULOSIS OF COLON 01/11/2009  . [place] DISEASE, LUMBAR 05/25/2009  . Diarrhea 05/31/2007  . 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 VERTIGO 11/10/2010  . SEBORRHEIC KERATOSIS, INFLAMED 12/02/2010  . ASTHMA 03/14/2007    NO PROBELEM IN 12 YRS  . DEPRESSION 03/14/2007  . Lung abnormality     LT LUNG WITH SPOT HAS BEEN FOLLOWED X 3 YRS DR Arlyce Dice  . Blood dyscrasia     VON [place] "FREE BLEEDER"   Past Surgical History  Procedure Laterality Date  . Cholecystectomy  1982    urosespsis post op  . Tonsillectomy  1952  . Spine surgery  1996    fusion, ruptured disk  .  Knee arthroscopy Right 1995    right, TNR  . Joint replacement  [place], REPLACED JOINT  . Left shoulder Left 2012   Family History  Problem Relation Age of Onset  . Heart disease Other   . Diabetes Other     diabeties  . Hyperlipidemia Other   . Hypertension Other   . Cancer Mother   . Diabetes Mother   . Heart disease Mother   . Hyperlipidemia Mother   . Hypertension Mother   . Hyperlipidemia Father   . Hypertension Father   . Colon cancer Maternal Aunt 26  . Colon cancer Maternal Uncle 82  . Colon cancer Maternal Uncle 80   History  Substance Use Topics  . Smoking status: Former Smoker -- 2.00 packs/day for 37 years    Types: Cigarettes    Quit date: 09/19/1999  . Smokeless tobacco: Never Used  . Alcohol Use: Yes     Comment: rare   OB History    No data available     Review of Systems  10 systems reviewed and found to be negative, except as noted in the HPI.  Allergies  Morphine and related  Home Medications   Prior to Admission medications   Medication Sig Start Date End Date Taking? Authorizing Provider  acetaminophen (TYLENOL) 325 MG tablet Take 650 mg by mouth every 6 (six) hours as needed for moderate pain.    Historical  Provider, MD  albuterol (PROVENTIL) (2.5 MG/3ML) 0.083% nebulizer solution Take 3 mLs (2.5 mg total) by nebulization every 6 (six) hours as needed for wheezing. 04/14/15 04/13/16  Dorothyann Peng, NP  aspirin 81 MG tablet Take 81 mg by mouth daily.      Historical Provider, MD  atorvastatin (LIPITOR) 40 MG tablet Take 1 tablet (40 mg total) by mouth daily. 04/06/14   Dorena Cookey, MD  citalopram (CELEXA) 20 MG tablet 1 by mouth each bedtime 04/14/15   Dorothyann Peng, NP  diphenoxylate-atropine (LOMOTIL) 2.5-0.025 MG per tablet Take 1 tablet by mouth 3 (three) times daily as needed for diarrhea or loose stools. 04/21/15   Dorothyann Peng, NP  docusate sodium (COLACE) 100 MG capsule Take 100 mg by mouth daily as needed. For  stool softner    Historical Provider, MD  fluticasone (FLONASE) 50 MCG/ACT nasal spray 1 spray up each nostril  at bedtime 04/14/15   Dorothyann Peng, NP  glucose blood test strip 1 each by Other route daily. 01/14/15   Dorena Cookey, MD  HYDROcodone-acetaminophen (NORCO/VICODIN) 5-325 MG per tablet Take 1-2 tablets by mouth every 6 hours as needed for pain and/or cough. 06/15/15   Wilhelm Ganaway, PA-C  lisinopril-hydrochlorothiazide (PRINZIDE,ZESTORETIC) 10-12.5 MG per tablet One half tab every morning Patient taking differently: 1/4 tab every morning 03/29/15   Dorena Cookey, MD  lisinopril-hydrochlorothiazide [facility]) 20-25 MG per tablet Half tab daily 04/14/15   Dorothyann Peng, NP  LORazepam (ATIVAN) 0.5 MG tablet 1 by mouth daily at bedtime 04/14/15   Dorothyann Peng, NP  LORazepam (ATIVAN) 1 MG tablet TAKE 1 TABLET BY MOUTH AT BEDTIME FOR SLEEP 05/03/15   Dorena Cookey, MD  metFORMIN (GLUCOPHAGE) 500 MG tablet Take 1 tablet (500 mg total) by mouth daily with breakfast. 04/14/15   Dorothyann Peng, NP  montelukast (SINGULAIR) 10 MG tablet Take 1 tablet (10 mg total) by mouth at bedtime. 04/14/15   Dorothyann Peng, NP  Multiple Vitamins-Minerals (OCUVITE PO) Take 1 tablet by mouth daily.     Historical Provider, MD   BP 127/72 mmHg  Pulse 87  Temp(Src) 99.1 F (37.3 C) (Oral)  Resp 18  Ht 5\' 8"  (1.727 m)  Wt 188 lb (85.276 kg)  BMI 28.59 kg/m2  SpO2 98% Physical Exam  Constitutional: She is oriented to person, place, and time. She appears well-developed and well-nourished. No distress.  HENT:  Head: Normocephalic and atraumatic.  Mouth/Throat: Oropharynx is clear and moist.  Patient has a left periorbital ecchymoses with a 1 cm full-thickness laceration on the superior lateral side. Bleeding is controlled.   No crepitance or tenderness to palpation along the orbital rim.  EOMI intact with no pain or diplopia No hemotympanum, battle signs or raccoon's eyes  No abnormal otorrhea or  rhinorrhea. Nasal septum midline.  No intraoral trauma.  Eyes: Conjunctivae and EOM are normal. Pupils are equal, round, and reactive to light.  Neck: Normal range of motion. Neck supple.  No midline C-spine  tenderness to palpation or step-offs appreciated. Patient has full range of motion without pain.  Grip/Biceps/Tricep strength 5/5 bilaterally, sensation to UE intact bilaterally.    Cardiovascular: Normal rate, regular rhythm and intact distal pulses.   Pulmonary/Chest: Effort normal and breath sounds normal. No stridor. No respiratory distress. She has no wheezes. She has no rales. She exhibits no tenderness.  No seatbelt sign, TTP or crepitance  Abdominal: Soft. Bowel sounds are normal. She exhibits no distension and no mass. There is  no tenderness. There is no rebound and no guarding.  No Seatbelt Sign  Musculoskeletal: Normal range of motion. She exhibits no edema or tenderness.  Left knee:  No deformity, erythema. Small partial-thickness abrasion. FROM. No effusion or crepitance. Anterior and posterior drawer show no abnormal laxity. Stable to valgus and varus stress. Joint lines are non-tender. Neurovascularly intact. Pt ambulates with non-antalgic gait.   Left shoulder:  Shoulder with no deformity. Reduced ROM to left shoulder, can abduct greater than 90. No TTP of rotator cuff musculature. Drop arm negative. Neurovascularly intact  No snuffbox tenderness to palpation bilaterally   Neurological: She is alert and oriented to person, place, and time.  Strength 5/5 x4 extremities   Distal sensation intact  Skin: Skin is warm.  Psychiatric: She has a normal mood and affect.  Nursing note and vitals reviewed.   ED Course  LACERATION REPAIR Date/Time: 06/15/2015 2:02 PM Performed by: Monico Blitz Authorized by: Monico Blitz Consent: Verbal consent obtained. Consent given by: patient Patient identity confirmed: verbally with patient Body area:  head/neck Location details: left eyebrow Laceration length: 1 cm Foreign bodies: glass Anesthesia: local infiltration Local anesthetic: LET (lido,epi,tetracaine) Anesthetic total: 1 ml Patient sedated: no Preparation: Patient was prepped and draped in the usual sterile fashion. Irrigation solution: saline Irrigation method: syringe Amount of cleaning: extensive Debridement: none Degree of undermining: none Skin closure: Ethilon (6-0) Approximation: close Approximation difficulty: simple Dressing: antibiotic ointment Patient tolerance: Patient tolerated the procedure well with no immediate complications   (including critical care time) Labs Review Labs Reviewed - No data to display  Imaging Review Ct Head Wo Contrast  06/15/2015   CLINICAL DATA:  Fall today.  Bruising.  Initial encounter.  EXAM: CT HEAD WITHOUT CONTRAST  CT MAXILLOFACIAL WITHOUT CONTRAST  CT CERVICAL SPINE WITHOUT CONTRAST  TECHNIQUE: Multidetector CT imaging of the head, cervical spine, and maxillofacial structures were performed using the standard protocol without intravenous contrast. Multiplanar CT image reconstructions of the cervical spine and maxillofacial structures were also generated.  COMPARISON:  MRI head 05/06/2010  FINDINGS: CT HEAD FINDINGS  Mild atrophy, typical for age.  Negative for hydrocephalus.  Negative for acute infarct or mass. Negative for intracranial hemorrhage  Negative for skull fracture.  CT MAXILLOFACIAL FINDINGS  Negative for facial fracture. No fracture of the orbit or nasal bone. No fracture the mandible. Paranasal sinuses are clear.  Regional soft tissues show bruising in the left maxilla axilla.  CT CERVICAL SPINE FINDINGS  Solid bony fusion C5-6. Disc degeneration with disc bulging at C3-4 and C4-5 . Prominent anterior osteophytes throughout the cervical spine. Disc degeneration and posterior spurring C7-T1 and T1-T2  Normal alignment.  No fracture.  IMPRESSION: No acute abnormality in the  head, face, or cervical spine. Negative for fracture.   Electronically Signed   By: Franchot Gallo M.D.   On: 06/15/2015 13:32   Ct Cervical Spine Wo Contrast  06/15/2015   CLINICAL DATA:  Fall today.  Bruising.  Initial encounter.  EXAM: CT HEAD WITHOUT CONTRAST  CT MAXILLOFACIAL WITHOUT CONTRAST  CT CERVICAL SPINE WITHOUT CONTRAST  TECHNIQUE: Multidetector CT imaging of the head, cervical spine, and maxillofacial structures were performed using the standard protocol without intravenous contrast. Multiplanar CT image reconstructions of the cervical spine and maxillofacial structures were also generated.  COMPARISON:  MRI head 05/06/2010  FINDINGS: CT HEAD FINDINGS  Mild atrophy, typical for age.  Negative for hydrocephalus.  Negative for acute infarct or mass. Negative for intracranial hemorrhage  Negative  for skull fracture.  CT MAXILLOFACIAL FINDINGS  Negative for facial fracture. No fracture of the orbit or nasal bone. No fracture the mandible. Paranasal sinuses are clear.  Regional soft tissues show bruising in the left maxilla axilla.  CT CERVICAL SPINE FINDINGS  Solid bony fusion C5-6. Disc degeneration with disc bulging at C3-4 and C4-5 . Prominent anterior osteophytes throughout the cervical spine. Disc degeneration and posterior spurring C7-T1 and T1-T2  Normal alignment.  No fracture.  IMPRESSION: No acute abnormality in the head, face, or cervical spine. Negative for fracture.   Electronically Signed   By: Franchot Gallo M.D.   On: 06/15/2015 13:32   Dg Shoulder Left  06/15/2015   CLINICAL DATA:  [place] today.  And injured left shoulder.  EXAM: LEFT SHOULDER - 2+ VIEW  COMPARISON:  None.  FINDINGS: There are mild AC and glenohumeral joint degenerative changes. No acute fracture or dislocation. The visualized left ribs are intact in the left lung is grossly clear. Left lower lobe lung nodule is stable since a chest x-ray from 2014.  IMPRESSION: Degenerative changes but no fracture or dislocation.    Electronically Signed   By: Marijo Sanes M.D.   On: 06/15/2015 13:35   Dg Knee Complete 4 Views Left  06/15/2015   CLINICAL DATA:  Acute left knee pain after fall while walking dog. Initial encounter.  EXAM: LEFT KNEE - COMPLETE 4+ VIEW  COMPARISON:  None.  FINDINGS: There is no evidence of fracture, dislocation, or joint effusion. Moderate narrowing of patellofemoral space is noted. Loose body is noted posteriorly within the joint space. Spurring of patella is noted. Soft tissues are unremarkable.  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 No acute abnormality seen in the left knee.   Electronically Signed   By: Marijo Conception, M.D.   On: 06/15/2015 13:36   Ct Maxillofacial Wo Cm  06/15/2015   CLINICAL DATA:  Fall today.  Bruising.  Initial encounter.  EXAM: CT HEAD WITHOUT CONTRAST  CT MAXILLOFACIAL WITHOUT CONTRAST  CT CERVICAL SPINE WITHOUT CONTRAST  TECHNIQUE: Multidetector CT imaging of the head, cervical spine, and maxillofacial structures were performed using the standard protocol without intravenous contrast. Multiplanar CT image reconstructions of the cervical spine and maxillofacial structures were also generated.  COMPARISON:  MRI head 05/06/2010  FINDINGS: CT HEAD FINDINGS  Mild atrophy, typical for age.  Negative for hydrocephalus.  Negative for acute infarct or mass. Negative for intracranial hemorrhage  Negative for skull fracture.  CT MAXILLOFACIAL FINDINGS  Negative for facial fracture. No fracture of the orbit or nasal bone. No fracture the mandible. Paranasal sinuses are clear.  Regional soft tissues show bruising in the left maxilla axilla.  CT CERVICAL SPINE FINDINGS  Solid bony fusion C5-6. Disc degeneration with disc bulging at C3-4 and C4-5 . Prominent anterior osteophytes throughout the cervical spine. Disc degeneration and posterior spurring C7-T1 and T1-T2  Normal alignment.  No fracture.  IMPRESSION: No acute abnormality in the head, face, or  cervical spine. Negative for fracture.   Electronically Signed   By: Franchot Gallo M.D.   On: 06/15/2015 13:32     EKG Interpretation None      MDM   Final diagnoses:  Fall  Facial laceration, initial encounter  Periorbital ecchymosis, left, initial encounter  Left knee sprain, initial encounter  Shoulder sprain, left, initial encounter    Filed Vitals:   06/15/15 1235  BP: 127/72  Pulse: 87  Temp: 99.1 F (  37.3 C)  TempSrc: Oral  Resp: 18  Height: 5\' 8"  (1.727 m)  Weight: 188 lb (85.276 kg)  SpO2: 98%    Medications  Tdap (BOOSTRIX) injection 0.5 mL (0.5 mLs Intramuscular Given 06/15/15 1332)  acetaminophen (TYLENOL) tablet 650 mg (650 mg Oral Given 06/15/15 1333)  lidocaine-EPINEPHrine-tetracaine (LET) solution (3 mLs Topical Given 06/15/15 1332)  lidocaine-EPINEPHrine (XYLOCAINE W/EPI) 2 %-1:200000 (PF) injection 10 mL (10 mLs Infiltration Given by Other 06/15/15 1332)    Lynn [name] is a pleasant 69 y.o. female presenting with facial trauma and left shoulder and knee pain status post slip and fall. There is no signs of entrapment. Given patient's age will CT face, head, C-spine and x-ray the painful joints.  Imaging negative for acute pathology. Wound is cleaned and closed, advise on wound care and suture removal in 7 days.  Evaluation does not show pathology that would require ongoing emergent intervention or inpatient treatment. Pt is hemodynamically stable and mentating appropriately. Discussed findings and plan with patient/guardian, who agrees with care plan. All questions answered. Return precautions discussed and outpatient follow up given.   New Prescriptions   HYDROCODONE-ACETAMINOPHEN (NORCO/VICODIN) 5-325 MG PER TABLET    Take 1-2 tablets by mouth every 6 hours as needed for pain and/or cough.         Monico Blitz, PA-C 06/15/15 [place], MD 06/15/15 1536

## 2015-06-15 NOTE — ED Notes (Addendum)
Was pulled down by dog during a walk approx 1 hour PTA-facial injury noted lac and bruising and left shoulder and left knee pain-no LOC

## 2015-06-18 ENCOUNTER — Other Ambulatory Visit: Payer: Self-pay | Admitting: Family Medicine

## 2015-06-22 ENCOUNTER — Ambulatory Visit (INDEPENDENT_AMBULATORY_CARE_PROVIDER_SITE_OTHER): Payer: Medicare Other | Admitting: Physician Assistant

## 2015-06-22 VITALS — BP 128/80 | HR 75 | Temp 97.8°F | Resp 16 | Ht 69.0 in | Wt 189.2 lb

## 2015-06-22 DIAGNOSIS — R0781 Pleurodynia: Secondary | ICD-10-CM

## 2015-06-22 DIAGNOSIS — T148 Other injury of unspecified body region: Secondary | ICD-10-CM

## 2015-06-22 DIAGNOSIS — IMO0002 Reserved for concepts with insufficient information to code with codable children: Secondary | ICD-10-CM

## 2015-06-22 NOTE — Progress Notes (Signed)
[facility] [address], [place] [address] [phone]  Date:  06/22/2015   Name:  Lynn [name]   DOB:  24-Oct-1946   MRN:  [id number]  PCP:  Joycelyn Man, MD    Chief Complaint: Suture / Staple Removal   History of Present Illness:  This is a 69 y.o. female who is presenting for suture removal for a left forehead laceration that occurred 7 days ago. She was seen at the ED. #1 suture in place. CT head and maxillofacial negative. Xray left shoulder and knee negative. She states her shoulder, left ribs, and orbit are tender. She is wondering whether she broke a rib but does not want an xray. 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 She denies SOB, fever, chills, palpitations, chest pain.  Review of Systems:  Review of Systems See HPI  Patient Active Problem List   Diagnosis Date Noted  . Health care maintenance 04/14/2015  . Insomnia 01/14/2015  . Chronic pain in right foot 07/06/2014  . Allergic rhinitis 10/13/2013  . SEBORRHEIC KERATOSIS, INFLAMED 12/02/2010  . Hyperlipidemia 06/21/2010  . UNSPECIFIED HEARING LOSS 01/13/2010  . [place] DISEASE, LUMBAR 05/25/2009  . DIVERTICULOSIS OF COLON 01/11/2009  . 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 DIARRHEA 05/31/2007  . 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 DEPRESSION 03/14/2007  . DEGENERATION, MACULAR NOS 03/14/2007  . Essential hypertension 03/14/2007  . Asthma 03/14/2007  . COPD 03/14/2007    Prior to Admission medications   Medication Sig Start Date End Date Taking? Authorizing Provider  ACCU-CHEK SMARTVIEW test strip TEST BLOOD SUGAR ONCE DAILY 06/18/15  Yes Dorena Cookey, MD  acetaminophen (TYLENOL) 325 MG tablet Take 650 mg by mouth every 6 (six) hours as needed for moderate pain.   Yes Historical Provider, MD  albuterol (PROVENTIL) (2.5 MG/3ML) 0.083% nebulizer solution Take 3 mLs (2.5 mg total) by nebulization every 6 (six) hours as needed  for wheezing. 04/14/15 04/13/16 Yes Dorothyann Peng, NP  aspirin 81 MG tablet Take 81 mg by mouth daily.     Yes Historical Provider, MD  atorvastatin (LIPITOR) 40 MG tablet Take 1 tablet (40 mg total) by mouth daily. 04/06/14  Yes Dorena Cookey, MD  citalopram (CELEXA) 20 MG tablet 1 by mouth each bedtime 04/14/15  Yes Dorothyann Peng, NP  diphenoxylate-atropine (LOMOTIL) 2.5-0.025 MG per tablet Take 1 tablet by mouth 3 (three) times daily as needed for diarrhea or loose stools. 04/21/15  Yes Dorothyann Peng, NP  docusate sodium (COLACE) 100 MG capsule Take 100 mg by mouth daily as needed. For stool softner   Yes Historical Provider, MD  fluticasone (FLONASE) 50 MCG/ACT nasal spray 1 spray up each nostril  at bedtime 04/14/15  Yes Dorothyann Peng, NP  HYDROcodone-acetaminophen (NORCO/VICODIN) 5-325 MG per tablet Take 1-2 tablets by mouth every 6 hours as needed for pain and/or cough. 06/15/15  Yes Nicole Pisciotta, PA-C  lisinopril-hydrochlorothiazide (PRINZIDE,ZESTORETIC) 10-12.5 MG per tablet One half tab every morning Patient taking differently: 1/4 tab every morning 03/29/15  Yes Dorena Cookey, MD  lisinopril-hydrochlorothiazide [facility]) 20-25 MG per tablet Half tab daily 04/14/15  Yes Dorothyann Peng, NP  LORazepam (ATIVAN) 0.5 MG tablet 1 by mouth daily at bedtime 04/14/15  Yes Dorothyann Peng, NP  LORazepam (ATIVAN) 1 MG tablet TAKE 1 TABLET BY MOUTH AT BEDTIME FOR SLEEP 05/03/15  Yes Dorena Cookey, MD  metFORMIN (GLUCOPHAGE) 500 MG tablet  Take 1 tablet (500 mg total) by mouth daily with breakfast. 04/14/15  Yes Dorothyann Peng, NP  montelukast (SINGULAIR) 10 MG tablet Take 1 tablet (10 mg total) by mouth at bedtime. 04/14/15  Yes Dorothyann Peng, NP  Multiple Vitamins-Minerals (OCUVITE PO) Take 1 tablet by mouth daily.    Yes Historical Provider, MD    Allergies  Allergen Reactions  . Morphine And Related Itching    Patient states that she can still take this medication it just makes her itch.    Past  Surgical History  Procedure Laterality Date  . Cholecystectomy  1982    urosespsis post op  . Tonsillectomy  1952  . Spine surgery  1996    fusion, ruptured disk  . Knee arthroscopy Right 1995    right, TNR  . Joint replacement  [place], REPLACED JOINT  . Left shoulder Left 2012    Social History  Substance Use Topics  . Smoking status: Former Smoker -- 2.00 packs/day for 37 years    Types: Cigarettes    Quit date: 09/19/1999  . Smokeless tobacco: Never Used  . Alcohol Use: Yes     Comment: rare    Family History  Problem Relation Age of Onset  . Heart disease Other   . Diabetes Other     diabeties  . Hyperlipidemia Other   . Hypertension Other   . Cancer Mother   . Diabetes Mother   . Heart disease Mother   . Hyperlipidemia Mother   . Hypertension Mother   . Hyperlipidemia Father   . Hypertension Father   . Colon cancer Maternal Aunt 66  . Colon cancer Maternal Uncle 82  . Colon cancer Maternal Uncle 80    Medication list has been reviewed and updated.  Physical Examination:  Physical Exam  Constitutional: She is oriented to person, place, and time. She appears well-developed and well-nourished. No distress.  HENT:  Head: Normocephalic and atraumatic.  Right Ear: Hearing normal.  Left Ear: Hearing normal.  Nose: Nose normal.  Eyes: Conjunctivae and lids are normal. Right eye exhibits no discharge. Left eye exhibits no discharge. No scleral icterus.  Cardiovascular: Normal rate, regular rhythm and normal pulses.   Pulmonary/Chest: Effort normal and breath sounds normal. No respiratory distress.    Tender over left lateral chest, see depiction  Musculoskeletal: Normal range of motion.  Neurological: She is alert and oriented to person, place, and time.  Skin: Skin is warm, dry and intact. Ecchymosis noted.  Ecchymosis over left shoulder Ecchymosis over left orbit. 1.5 cm laceration above left eyebrow with intact skin. #1  suture removed.  Psychiatric: She has a normal mood and affect. Her speech is normal and behavior is normal. Thought content normal.   BP 128/80 mmHg  Pulse 75  Temp(Src) 97.8 F (36.6 C) (Oral)  Resp 16  Ht 5\' 9"  (1.753 m)  Wt 189 lb 3.2 oz (85.821 kg)  BMI 27.93 kg/m2  SpO2 98%  Assessment and Plan:  1. Laceration Healed well, skin intact. #1 suture removed.  2. Rib pain on left side Pt does not want radiograph today. She will use tylenol for pain and practice deep breathing exercises to prevent pneumonia. Return if symptoms not improving in 2-4 weeks.   Benjaman Pott Drenda Freeze, MHS [facility]  06/22/2015

## 2015-07-06 ENCOUNTER — Other Ambulatory Visit: Payer: Self-pay | Admitting: Family Medicine

## 2015-07-13 ENCOUNTER — Other Ambulatory Visit: Payer: Self-pay | Admitting: Family Medicine

## 2015-07-13 DIAGNOSIS — E139 Other specified diabetes mellitus without complications: Secondary | ICD-10-CM

## 2015-07-14 ENCOUNTER — Other Ambulatory Visit (INDEPENDENT_AMBULATORY_CARE_PROVIDER_SITE_OTHER): Payer: Medicare Other

## 2015-07-14 DIAGNOSIS — E139 Other specified diabetes mellitus without complications: Secondary | ICD-10-CM | POA: Diagnosis not present

## 2015-07-14 LAB — BASIC METABOLIC PANEL
BUN: 16 mg/dL (ref 6–23)
CO2: 30 mEq/L (ref 19–32)
Calcium: 10.2 mg/dL (ref 8.4–10.5)
Chloride: 101 mEq/L (ref 96–112)
Creatinine, Ser: 0.74 mg/dL (ref 0.40–1.20)
GFR: 82.68 mL/min (ref >60.00)
GLUCOSE: 103 mg/dL — AB (ref 70–99)
POTASSIUM: 4.9 meq/L (ref 3.5–5.1)
Sodium: 138 mEq/L (ref 135–145)

## 2015-07-14 LAB — HEMOGLOBIN A1C: Hgb A1c MFr Bld: 5.9 % (ref 4.6–6.5)

## 2015-07-19 ENCOUNTER — Other Ambulatory Visit: Payer: Medicare Other

## 2015-07-20 ENCOUNTER — Ambulatory Visit (INDEPENDENT_AMBULATORY_CARE_PROVIDER_SITE_OTHER): Payer: Medicare Other | Admitting: Family Medicine

## 2015-07-20 ENCOUNTER — Encounter: Payer: Self-pay | Admitting: Family Medicine

## 2015-07-20 VITALS — BP 110/80 | Temp 98.6°F | Wt 185.0 lb

## 2015-07-20 DIAGNOSIS — I10 Essential (primary) hypertension: Secondary | ICD-10-CM

## 2015-07-20 DIAGNOSIS — E139 Other specified diabetes mellitus without complications: Secondary | ICD-10-CM | POA: Diagnosis not present

## 2015-07-20 MED ORDER — LISINOPRIL 2.5 MG PO TABS: 2.5000 mg | ORAL_TABLET | Freq: Every day | ORAL | Status: DC

## 2015-07-20 NOTE — Progress Notes (Signed)
   Subjective:    Patient ID: Lynn [name], female    DOB: 10/23/1946, 69 y.o.   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 Blood sugar random 103. She continues to lose weight. She's lost 40 pounds over the last 12 months via diet and exercise. She's taking her lisinopril breaking it in quarters. So she is taking 2.5 mg a day. We'll decrease that and give her a new prescription for the smaller dose pills.  We talked about decreasing metformin from 500 mg daily to 250 mg daily since her blood sugar was so normal.  She also has a bump on her right side of her chest just below her right breast is new.   Review of Systems    review of systems otherwise negative Objective:   Physical Exam  Well-developed well-nourished female no acute distress vital signs stable she's afebrile BP right arm sitting position 110/80 pulse 70 and regular  Labs as above  The lesion she describes it is a 6 mm x 10 mm lesion with a portal. Sebaceous fluid was expressed      Assessment & Plan:  Diabetes type 2 under excellent control...Marland KitchenMarland KitchenMarland Kitchen may decrease metformin to 250 mg daily....... follow-up in 6 months  Hypertension ago........Marland Kitchen lisinopril 2.5 mg daily  Sebaceous cyst.......... observe

## 2015-07-20 NOTE — Progress Notes (Signed)
Pre visit review using our clinic review tool, if applicable. No additional management support is needed unless otherwise documented below in the visit note. Lab Results  Component Value Date   HGBA1C 5.9 07/14/2015   HGBA1C 6.3 04/07/2015   HGBA1C 6.6* 03/22/2015   Lab Results  Component Value Date   MICROALBUR 0.4 03/24/2014   LDLCALC 68 04/07/2015   CREATININE 0.74 07/14/2015   Influenza immunization was not given due to high dose not available at this time.

## 2015-07-20 NOTE — Patient Instructions (Addendum)
Lisinopril 2.5 mg......... one tablet daily in the morning  Metformin 500 mg......Marland Kitchen may decrease to one half tablet daily in the morning  Return in June for your annual exam sooner if any problems  Continue diet exercise and weight loss  [employer]......Marland Kitchen our new adult nurse practitioner from [facility]

## 2015-07-26 ENCOUNTER — Ambulatory Visit: Payer: Medicare Other | Admitting: Family Medicine

## 2015-07-28 ENCOUNTER — Ambulatory Visit (INDEPENDENT_AMBULATORY_CARE_PROVIDER_SITE_OTHER): Payer: Medicare Other | Admitting: *Deleted

## 2015-07-28 DIAGNOSIS — Z23 Encounter for immunization: Secondary | ICD-10-CM

## 2015-10-21 ENCOUNTER — Ambulatory Visit (INDEPENDENT_AMBULATORY_CARE_PROVIDER_SITE_OTHER): Payer: Medicare Other | Admitting: Emergency Medicine

## 2015-10-21 VITALS — BP 132/88 | HR 84 | Temp 97.6°F | Resp 16 | Ht 70.0 in | Wt 190.0 lb

## 2015-10-21 DIAGNOSIS — J209 Acute bronchitis, unspecified: Secondary | ICD-10-CM | POA: Diagnosis not present

## 2015-10-21 MED ORDER — AZITHROMYCIN 250 MG PO TABS: ORAL_TABLET | ORAL | Status: DC

## 2015-10-21 NOTE — Progress Notes (Signed)
Subjective:  Patient ID: Lynn [name], female    DOB: 04-Dec-1945  Age: 69 y.o. MRN: [id number]  CC: Chills and Asthma   HPI [name] presents  patient has a several week history of nasal congestion postnasal drainage and a purulent nasal drainage. She has a cough productive of purulent sputum. She has a history of wheezing. No shortness of breath however. She describes having a fever and chills but has not taken her temperature and can't document it. She has no nausea vomiting or stool change no rash. She's had no improvement with over-the-counter medication uses her inhaler of albuterol regularly.  History Lynn [name] has a past medical history of DIABETES MELLITUS, TYPE II (03/14/2007); HYPERLIPIDEMIA (06/21/2010); DEGENERATION, MACULAR NOS (03/14/2007); Unspecified hearing loss (01/13/2010); HYPERTENSION (03/14/2007); DIVERTICULOSIS OF COLON (01/11/2009); DISC DISEASE, LUMBAR (05/25/2009); Diarrhea (05/31/2007); COLONIC POLYPS, ADENOMATOUS, HX OF (01/11/2009); VERTIGO (11/10/2010); SEBORRHEIC KERATOSIS, INFLAMED (12/02/2010); ASTHMA (03/14/2007); DEPRESSION (03/14/2007); Lung abnormality; and Blood dyscrasia.   She has past surgical history that includes Cholecystectomy (1982); Tonsillectomy (1952); Spine surgery (1996); Knee arthroscopy (Right, 1995); Joint replacement (1998); and left shoulder (Left, 2012).   Her  family history includes Cancer in her mother; Colon cancer (age of onset: 91) in her maternal aunt; Colon cancer (age of onset: 49) in her maternal uncle; Colon cancer (age of onset: 60) in her maternal uncle; Diabetes in her mother and other; Heart disease in her mother and other; Hyperlipidemia in her father, mother, and other; Hypertension in her father, mother, and other.  She   reports that she quit smoking about 16 years ago. Her smoking use included Cigarettes. She has a 74 pack-year smoking history. She has never used smokeless tobacco. She reports that she drinks alcohol. She  reports that she does not use illicit drugs.  Outpatient Prescriptions Prior to Visit  Medication Sig Dispense Refill  . ACCU-CHEK SMARTVIEW test strip TEST BLOOD SUGAR ONCE DAILY 100 each 11  . acetaminophen (TYLENOL) 325 MG tablet Take 650 mg by mouth every 6 (six) hours as needed for moderate pain.    Marland Kitchen albuterol (PROVENTIL) (2.5 MG/3ML) 0.083% nebulizer solution Take 3 mLs (2.5 mg total) by nebulization every 6 (six) hours as needed for wheezing. 3 mL 11  . aspirin 81 MG tablet Take 81 mg by mouth daily.      Marland Kitchen atorvastatin (LIPITOR) 40 MG tablet TAKE 1 TABLET BY MOUTH ONCE DAILY 100 tablet 2  . citalopram (CELEXA) 20 MG tablet 1 by mouth each bedtime 90 tablet 3  . fluticasone (FLONASE) 50 MCG/ACT nasal spray 1 spray up each nostril  at bedtime 16 g 6  . lisinopril (ZESTRIL) 2.5 MG tablet Take 1 tablet (2.5 mg total) by mouth daily. 100 tablet 3  . lisinopril-hydrochlorothiazide (PRINZIDE,ZESTORETIC) 10-12.5 MG per tablet One half tab every morning (Patient taking differently: 1/4 tab every morning) 90 tablet 3  . LORazepam (ATIVAN) 0.5 MG tablet 1 by mouth daily at bedtime 30 tablet 0  . LORazepam (ATIVAN) 1 MG tablet TAKE 1 TABLET BY MOUTH AT BEDTIME FOR SLEEP 90 tablet 1  . montelukast (SINGULAIR) 10 MG tablet Take 1 tablet (10 mg total) by mouth at bedtime. 90 tablet 3  . Multiple Vitamins-Minerals (OCUVITE PO) Take 1 tablet by mouth daily.     . diphenoxylate-atropine (LOMOTIL) 2.5-0.025 MG per tablet Take 1 tablet by mouth 3 (three) times daily as needed for diarrhea or loose stools. (Patient not taking: Reported on 10/21/2015) 30 tablet 0  . docusate sodium (  COLACE) 100 MG capsule Take 100 mg by mouth daily as needed. Reported on 10/21/2015    . HYDROcodone-acetaminophen (NORCO/VICODIN) 5-325 MG per tablet Take 1-2 tablets by mouth every 6 hours as needed for pain and/or cough. (Patient not taking: Reported on 10/21/2015) 15 tablet 0  . metFORMIN (GLUCOPHAGE) 500 MG tablet Take 1  tablet (500 mg total) by mouth daily with breakfast. (Patient not taking: Reported on 10/21/2015) 90 tablet 3   No facility-administered medications prior to visit.    Social History   Social History  . Marital Status: Single    Spouse Name: N/A  . Number of Children: N/A  . Years of Education: N/A   Social History Main Topics  . Smoking status: Former Smoker -- 2.00 packs/day for 37 years    Types: Cigarettes    Quit date: 09/19/1999  . Smokeless tobacco: Never Used  . Alcohol Use: Yes     Comment: rare  . Drug Use: No  . Sexual Activity: Not Asked   Other Topics Concern  . None   Social History Narrative     Review of Systems  Constitutional: Positive for fever and chills. Negative for appetite change.  HENT: Positive for congestion and rhinorrhea. Negative for ear pain, postnasal drip, sinus pressure and sore throat.   Eyes: Negative for pain and redness.  Respiratory: Positive for cough and wheezing. Negative for shortness of breath.   Cardiovascular: Negative for leg swelling.  Gastrointestinal: Negative for nausea, vomiting, abdominal pain, diarrhea, constipation and blood in stool.  Endocrine: Negative for polyuria.  Genitourinary: Negative for dysuria, urgency, frequency and flank pain.  Musculoskeletal: Negative for gait problem.  Skin: Negative for rash.  Neurological: Negative for weakness and headaches.  Psychiatric/Behavioral: Negative for confusion and decreased concentration. The patient is not nervous/anxious.     Objective:  BP 132/88 mmHg  Pulse 84  Temp(Src) 97.6 F (36.4 C) (Oral)  Resp 16  Ht 5\' 10"  (1.778 m)  Wt 190 lb (86.183 kg)  BMI 27.26 kg/m2  SpO2 98%  Physical Exam  Constitutional: She is oriented to person, place, and time. She appears well-developed and well-nourished.  HENT:  Head: Normocephalic and atraumatic.  Eyes: Conjunctivae are normal. Pupils are equal, round, and reactive to light.  Pulmonary/Chest: Effort normal. No  respiratory distress. She has no wheezes. She has no rales.  Musculoskeletal: She exhibits no edema.  Neurological: She is alert and oriented to person, place, and time.  Skin: Skin is dry.  Psychiatric: She has a normal mood and affect. Her behavior is normal. Thought content normal.      Assessment & Plan:   Lynn [name] was seen today for chills and asthma.  Diagnoses and all orders for this visit:  Acute bronchitis, unspecified organism  Other orders -     azithromycin (ZITHROMAX) 250 MG tablet; Take 2 tabs PO x 1 dose, then 1 tab PO QD x 4 days  I am having Ms. Casas start on azithromycin. I am also having her maintain her aspirin, Multiple Vitamins-Minerals (OCUVITE PO), docusate sodium, acetaminophen, lisinopril-hydrochlorothiazide, citalopram, fluticasone, metFORMIN, albuterol, montelukast, LORazepam, diphenoxylate-atropine, LORazepam, HYDROcodone-acetaminophen, ACCU-CHEK SMARTVIEW, atorvastatin, and lisinopril.  Meds ordered this encounter  Medications  . azithromycin (ZITHROMAX) 250 MG tablet    Sig: Take 2 tabs PO x 1 dose, then 1 tab PO QD x 4 days    Dispense:  6 tablet    Refill:  0    Appropriate red flag conditions were discussed with the patient as  well as actions that should be taken.  Patient expressed his understanding.  Follow-up: No Follow-up on file.  Roselee Culver, MD

## 2015-10-21 NOTE — Patient Instructions (Signed)

## 2015-10-25 ENCOUNTER — Encounter: Payer: Self-pay | Admitting: Family Medicine

## 2015-10-25 ENCOUNTER — Ambulatory Visit (INDEPENDENT_AMBULATORY_CARE_PROVIDER_SITE_OTHER): Payer: Medicare Other | Admitting: Family Medicine

## 2015-10-25 VITALS — BP 120/80 | Temp 98.3°F

## 2015-10-25 DIAGNOSIS — J4531 Mild persistent asthma with (acute) exacerbation: Secondary | ICD-10-CM | POA: Diagnosis not present

## 2015-10-25 MED ORDER — PREDNISONE 20 MG PO TABS: ORAL_TABLET | ORAL | Status: DC

## 2015-10-25 NOTE — Progress Notes (Signed)
   Subjective:    Patient ID: Lynn [name], female    DOB: 12/03/1945, 69 y.o.   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 Her asthma flares up once or twice a year. In the past we have given her short course of prednisone and her Asperger's all very nicely. She also has a nebulizer at home that she uses when she has a flare.  She tells me then on Sunday, December 11 he had an episode of fever and chills developed a congestion runny nose and sore throat. By Wednesday the 14th she began wheezing. She went to an urgent care center was seen by Dr. Ouida Sills. I reviewed his notes. Her pulmonary exam at that time was normal however for reasons of the skate me she was given a Z-Pak. Azithromycin as an antibiotic this for bacterial infections however she had a viral infection with secondary wheezing. Therefore azithromycin was not indicated. She knows that and never got the prescription filled.  She has prednisone at home. She took 40 mg the first day and is been on 20 mg daily since that time. She tells me he was very critical overtaking prednisone. She did not like the encounter.   Review of Systems    review of systems otherwise negative Objective:   Physical Exam  Well-developed well-nourished female no acute distress vital signs stable she's afebrile HEENT negative neck was supple no adenopathy lungs are clear except for some mild symmetrical wheezing on forced expiration      Assessment & Plan:  Viral syndrome with secondary asthma........... taper prednisone as outlined return when necessary

## 2015-10-25 NOTE — Progress Notes (Signed)
Pre visit review using our clinic review tool, if applicable. No additional management support is needed unless otherwise documented below in the visit note. 

## 2015-10-25 NOTE — Patient Instructions (Signed)
Prednisone 20 mg......... 2 tabs 3 days then taper as outlined  Drink lots of water  Hydromet 1/2-1 teaspoon at bedtime when necessary for cough

## 2015-11-03 ENCOUNTER — Other Ambulatory Visit: Payer: Self-pay | Admitting: Adult Health

## 2015-11-03 NOTE — Telephone Encounter (Signed)
Ok to fill for one month, 30 pills, 0 refills

## 2015-11-03 NOTE — Telephone Encounter (Signed)
Pt last visit 10/25/15 Pt last refill 05/03/15 #90 with 1 refills

## 2015-11-11 DIAGNOSIS — M25512 Pain in left shoulder: Secondary | ICD-10-CM | POA: Diagnosis not present

## 2015-11-11 DIAGNOSIS — M65342 Trigger finger, left ring finger: Secondary | ICD-10-CM | POA: Diagnosis not present

## 2015-11-11 DIAGNOSIS — M65332 Trigger finger, left middle finger: Secondary | ICD-10-CM | POA: Diagnosis not present

## 2015-11-12 ENCOUNTER — Telehealth: Payer: Self-pay | Admitting: Family Medicine

## 2015-11-12 MED ORDER — DIAZEPAM 5 MG PO TABS: 5.0000 mg | ORAL_TABLET | Freq: Every day | ORAL | Status: DC

## 2015-11-12 NOTE — Telephone Encounter (Signed)
Pt needs new rx valium 5 mg call into [facility] [address]. Pt rx expired in 2008. Pt needs this med to help her sleep.

## 2015-11-12 NOTE — Telephone Encounter (Signed)
Rx sent and patient is aware. 

## 2015-12-07 ENCOUNTER — Other Ambulatory Visit: Payer: Self-pay | Admitting: Family Medicine

## 2015-12-07 MED FILL — LORazepam 1 MG TABS: 1 | 90 days supply | Qty: 90 | Fill #0

## 2015-12-21 DIAGNOSIS — Z124 Encounter for screening for malignant neoplasm of cervix: Secondary | ICD-10-CM | POA: Diagnosis not present

## 2015-12-21 DIAGNOSIS — Z01419 Encounter for gynecological examination (general) (routine) without abnormal findings: Secondary | ICD-10-CM | POA: Diagnosis not present

## 2015-12-23 DIAGNOSIS — Z1231 Encounter for screening mammogram for malignant neoplasm of breast: Secondary | ICD-10-CM | POA: Diagnosis not present

## 2015-12-23 DIAGNOSIS — Z803 Family history of malignant neoplasm of breast: Secondary | ICD-10-CM | POA: Diagnosis not present

## 2015-12-23 LAB — HM MAMMOGRAPHY

## 2015-12-27 ENCOUNTER — Encounter: Payer: Self-pay | Admitting: Family Medicine

## 2015-12-29 DIAGNOSIS — N890 Mild vaginal dysplasia: Secondary | ICD-10-CM | POA: Diagnosis not present

## 2015-12-30 MED FILL — metFORMIN HCL 500 MG TABS: 500 | 90 days supply | Qty: 90 | Fill #1

## 2016-01-12 ENCOUNTER — Encounter: Payer: Self-pay | Admitting: Family Medicine

## 2016-01-12 ENCOUNTER — Ambulatory Visit (INDEPENDENT_AMBULATORY_CARE_PROVIDER_SITE_OTHER): Payer: Medicare Other | Admitting: Family Medicine

## 2016-01-12 ENCOUNTER — Telehealth: Payer: Self-pay | Admitting: *Deleted

## 2016-01-12 ENCOUNTER — Telehealth: Payer: Self-pay | Admitting: Family Medicine

## 2016-01-12 VITALS — BP 144/82 | HR 82 | Temp 97.9°F | Ht 70.0 in

## 2016-01-12 DIAGNOSIS — J45901 Unspecified asthma with (acute) exacerbation: Secondary | ICD-10-CM

## 2016-01-12 DIAGNOSIS — J302 Other seasonal allergic rhinitis: Secondary | ICD-10-CM

## 2016-01-12 MED ORDER — MONTELUKAST SODIUM 10 MG PO TABS: 10.0000 mg | ORAL_TABLET | Freq: Every day | ORAL | Status: DC

## 2016-01-12 MED FILL — MONTELUKAST SOD 10 MG TAB: 10 | 90 days supply | Qty: 90 | Fill #0

## 2016-01-12 NOTE — Telephone Encounter (Signed)
Rx for Montelukast was printed and faxed to [facility] at [phone].

## 2016-01-12 NOTE — Progress Notes (Signed)
HPI: Lynn [name] is here for an acute visit for  Cough/Asthma: -started: 2 weeks ago with allergies, now using alb 1-2 times per day for cough or wheeze -symptoms:clear nasal congestion, pnd, cough, occ wheeze -denies:fever, SOB, NVD, tooth pain, thick sputum or colored nasal congestion -has tried:  -sick contacts/travel/risks: no reported flu, strep or tick exposure -Hx of: allergies and asthma, out of her singulair and requests refills, reports has prednisone at home to take for this but wanted to confirm dosing and if should take; reports usually has take prednisone 1-2 times yearly.  ROS: See pertinent positives and negatives per HPI.  Past Medical History  Diagnosis Date  . DIABETES MELLITUS, TYPE II 03/14/2007  . HYPERLIPIDEMIA 06/21/2010  . DEGENERATION, MACULAR NOS 03/14/2007  . Unspecified hearing loss 01/13/2010  . HYPERTENSION 03/14/2007  . DIVERTICULOSIS OF COLON 01/11/2009  . [place] DISEASE, LUMBAR 05/25/2009  . Diarrhea 05/31/2007  . 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 VERTIGO 11/10/2010  . SEBORRHEIC KERATOSIS, INFLAMED 12/02/2010  . ASTHMA 03/14/2007    NO PROBELEM IN 12 YRS  . DEPRESSION 03/14/2007  . Lung abnormality     LT LUNG WITH SPOT HAS BEEN FOLLOWED X 3 YRS DR Arlyce Dice  . Blood dyscrasia     [facility] "FREE BLEEDER"    Past Surgical History  Procedure Laterality Date  . Cholecystectomy  1982    urosespsis post op  . Tonsillectomy  1952  . Spine surgery  1996    fusion, ruptured disk  . Knee arthroscopy Right 1995    right, TNR  . Joint replacement  [place], REPLACED JOINT  . Left shoulder Left 2012    Family History  Problem Relation Age of Onset  . Heart disease Other   . Diabetes Other     diabeties  . Hyperlipidemia Other   . Hypertension Other   . Cancer Mother   . Diabetes Mother   . Heart disease Mother   . Hyperlipidemia Mother   . Hypertension Mother   . Hyperlipidemia Father   .  Hypertension Father   . Colon cancer Maternal Aunt 70  . Colon cancer Maternal Uncle 82  . Colon cancer Maternal Uncle 42    Social History   Social History  . Marital Status: Single    Spouse Name: N/A  . Number of Children: N/A  . Years of Education: N/A   Social History Main Topics  . Smoking status: Former Smoker -- 2.00 packs/day for 37 years    Types: Cigarettes    Quit date: 09/19/1999  . Smokeless tobacco: Never Used  . Alcohol Use: Yes     Comment: rare  . Drug Use: No  . Sexual Activity: Not Asked   Other Topics Concern  . None   Social History Narrative     Current outpatient prescriptions:  .  ACCU-CHEK SMARTVIEW test strip, TEST BLOOD SUGAR ONCE DAILY, Disp: 100 each, Rfl: 11 .  acetaminophen (TYLENOL) 325 MG tablet, Take 650 mg by mouth every 6 (six) hours as needed for moderate pain., Disp: , Rfl:  .  albuterol (PROVENTIL) (2.5 MG/3ML) 0.083% nebulizer solution, Take 3 mLs (2.5 mg total) by nebulization every 6 (six) hours as needed for wheezing., Disp: 3 mL, Rfl: 11 .  aspirin 81 MG tablet, Take 81 mg by mouth daily.  , Disp: , Rfl:  .  atorvastatin (LIPITOR) 40 MG tablet, TAKE 1 TABLET BY  MOUTH ONCE DAILY, Disp: 100 tablet, Rfl: 2 .  citalopram (CELEXA) 20 MG tablet, 1 by mouth each bedtime, Disp: 90 tablet, Rfl: 3 .  diazepam (VALIUM) 5 MG tablet, Take 1 tablet (5 mg total) by mouth at bedtime., Disp: 90 tablet, Rfl: 1 .  diphenoxylate-atropine (LOMOTIL) 2.5-0.025 MG per tablet, Take 1 tablet by mouth 3 (three) times daily as needed for diarrhea or loose stools., Disp: 30 tablet, Rfl: 0 .  docusate sodium (COLACE) 100 MG capsule, Take 100 mg by mouth daily as needed. Reported on 10/21/2015, Disp: , Rfl:  .  fluticasone (FLONASE) 50 MCG/ACT nasal spray, 1 spray up each nostril  at bedtime, Disp: 16 g, Rfl: 6 .  HYDROcodone-acetaminophen (NORCO/VICODIN) 5-325 MG per tablet, Take 1-2 tablets by mouth every 6 hours as needed for pain and/or cough., Disp: 15  tablet, Rfl: 0 .  lisinopril (ZESTRIL) 2.5 MG tablet, Take 1 tablet (2.5 mg total) by mouth daily., Disp: 100 tablet, Rfl: 3 .  lisinopril-hydrochlorothiazide (PRINZIDE,ZESTORETIC) 10-12.5 MG per tablet, One half tab every morning (Patient taking differently: 1/4 tab every morning), Disp: 90 tablet, Rfl: 3 .  LORazepam (ATIVAN) 0.5 MG tablet, 1 by mouth daily at bedtime, Disp: 30 tablet, Rfl: 0 .  LORazepam (ATIVAN) 1 MG tablet, TAKE 1 TABLET BY MOUTH AT BEDTIME FOR SLEEP, Disp: 90 tablet, Rfl: 1 .  metFORMIN (GLUCOPHAGE) 500 MG tablet, Take 1 tablet (500 mg total) by mouth daily with breakfast., Disp: 90 tablet, Rfl: 3 .  montelukast (SINGULAIR) 10 MG tablet, Take 1 tablet (10 mg total) by mouth at bedtime., Disp: 90 tablet, Rfl: 0 .  Multiple Vitamins-Minerals (OCUVITE PO), Take 1 tablet by mouth daily. , Disp: , Rfl:  .  predniSONE (DELTASONE) 20 MG tablet, 2 tabs x 3 days, 1 tab x 3 days, 1/2 tab x 3 days, 1/2 tab M,W,F x 2 weeks, Disp: 40 tablet, Rfl: 1  EXAM:  Filed Vitals:   01/12/16 1107  BP: 144/82  Pulse: 82  Temp: 97.9 F (36.6 C)    There is no weight on file to calculate BMI.  GENERAL: vitals reviewed and listed above, alert, oriented, appears well hydrated and in no acute distress  HEENT: atraumatic, conjunttiva clear, no obvious abnormalities on inspection of external nose and ears, normal appearance of ear canals and TMs, clear nasal congestion, mild post oropharyngeal erythema with PND, no tonsillar edema or exudate, no sinus TTP  NECK: no obvious masses on inspection  LUNGS: clear to auscultation bilaterally, no wheezes, rales or rhonchi, good air movement  CV: HRRR, no peripheral edema  MS: moves all extremities without noticeable abnormality  PSYCH: pleasant and cooperative, no obvious depression or anxiety  ASSESSMENT AND PLAN:  Discussed the following assessment and plan:  Seasonal allergies  Asthma with acute exacerbation, unspecified asthma  severity  -given HPI and exam findings today, a serious infection or illness is unlikely. We discussed potential etiologies, with allergic rhinitis or VURI with asthma exacerbation being most likely. We discussed treatment side effects, likely course, antibiotic misuse, transmission, and signs of developing a serious illness. Treat with short taper of prednisone her medication instructions. She has been done at home that she prefers to use. When necessary albuterol and cough medication. Refilled Singulair. -of course, we advised to return or notify a doctor immediately if symptoms worsen or persist or new concerns arise.    There are no Patient Instructions on file for this visit.   Colin Benton R.

## 2016-01-12 NOTE — Progress Notes (Signed)
Pre visit review using our clinic review tool, if applicable. No additional management support is needed unless otherwise documented below in the visit note. Patient declines weight measurement today. 

## 2016-01-12 NOTE — Telephone Encounter (Signed)
Pt request refill of the following: predniSONE (DELTASONE) 20 MG tablet  Pt said she saw Dr Maudie Mercury today and was told to continue to take the above med for asthma. She said she will not have enough of the med to cpmplete   Phamacy: [facility]

## 2016-01-13 MED ORDER — PREDNISONE 20 MG PO TABS: ORAL_TABLET | ORAL | Status: DC

## 2016-01-13 MED FILL — predniSONE 20 MG TABS: 20 | 23 days supply | Qty: 14 | Fill #0

## 2016-01-13 NOTE — Telephone Encounter (Signed)
Rx sent 

## 2016-01-19 ENCOUNTER — Encounter: Payer: Self-pay | Admitting: Family Medicine

## 2016-01-19 ENCOUNTER — Ambulatory Visit (INDEPENDENT_AMBULATORY_CARE_PROVIDER_SITE_OTHER): Payer: Medicare Other | Admitting: Family Medicine

## 2016-01-19 VITALS — BP 130/90 | HR 82 | Temp 98.3°F

## 2016-01-19 DIAGNOSIS — J4531 Mild persistent asthma with (acute) exacerbation: Secondary | ICD-10-CM | POA: Diagnosis not present

## 2016-01-19 MED ORDER — PREDNISONE 20 MG PO TABS: ORAL_TABLET | ORAL | Status: DC

## 2016-01-19 MED ORDER — ZOLPIDEM TARTRATE 5 MG PO TABS: 5.0000 mg | ORAL_TABLET | Freq: Every evening | ORAL | Status: DC | PRN

## 2016-01-19 NOTE — Patient Instructions (Signed)
Prednisone 20 mg................. 2 tabs now......... starting tomorrow 2 tabs daily until your wheezing is markedly diminished............ then slowly taper as outlined  Drink lots of water  Hand-held nebulizer twice daily  Cough syrup 1/2 teaspoon 3 times daily  Return when necessary

## 2016-01-19 NOTE — Progress Notes (Signed)
   Subjective:    Patient ID: Lynn [name], female    DOB: 10/04/1946, 70 y.o.   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  She saw Dr. Ninfa Meeker last week and was started prednisone 40 mg daily she's now down to 20 mg but she doesn't feel a whole lot better.   Review of Systems    review of systems otherwise negative Objective:   Physical Exam  Well-developed well-nourished female no acute distress vital signs stable she's afebrile respiratory rate 12 and unlabored oxygen 99%  HEENT were negative neck was supple no adenopathy lungs are clear except for some wheezing on forced expiration      Assessment & Plan:  Asthma recurrent chronic............. increased prednisone to 40 mg a day toe asymptomatic and taper slowly

## 2016-01-19 NOTE — Progress Notes (Signed)
Pre visit review using our clinic review tool, if applicable. No additional management support is needed unless otherwise documented below in the visit note. 

## 2016-01-20 ENCOUNTER — Other Ambulatory Visit: Payer: Self-pay | Admitting: Family Medicine

## 2016-01-20 MED FILL — predniSONE 20 MG TABS: 20 | 23 days supply | Qty: 40 | Fill #0

## 2016-01-20 MED FILL — ZOLPIDEM TARTRATE 5 MG TAB: 5 | 60 days supply | Qty: 60 | Fill #0

## 2016-01-21 MED FILL — ATORVASTATIN 40 MG TABLET: 40 | 90 days supply | Qty: 90 | Fill #2

## 2016-01-24 ENCOUNTER — Other Ambulatory Visit: Payer: Self-pay | Admitting: *Deleted

## 2016-01-24 MED ORDER — ALBUTEROL SULFATE (2.5 MG/3ML) 0.083% IN NEBU: 2.5000 mg | INHALATION_SOLUTION | Freq: Four times a day (QID) | RESPIRATORY_TRACT | Status: DC | PRN

## 2016-01-24 MED FILL — ALBUTEROL 0.083 MG/ML SOLN: (2.5 MG/3ML | 7 days supply | Qty: 90 | Fill #0

## 2016-02-04 MED FILL — LISINOPRIL-HCTZ 20-25 MG TA: 20-25 | 90 days supply | Qty: 45 | Fill #0

## 2016-02-07 MED FILL — CITALOPRAM HBR 20 MG TABLET: 20 | 90 days supply | Qty: 90 | Fill #0

## 2016-03-01 DIAGNOSIS — H524 Presbyopia: Secondary | ICD-10-CM | POA: Diagnosis not present

## 2016-03-01 DIAGNOSIS — H5203 Hypermetropia, bilateral: Secondary | ICD-10-CM | POA: Diagnosis not present

## 2016-03-01 LAB — HM DIABETES EYE EXAM

## 2016-03-06 ENCOUNTER — Encounter: Payer: Self-pay | Admitting: Family Medicine

## 2016-03-07 ENCOUNTER — Other Ambulatory Visit: Payer: Medicare Other

## 2016-03-09 ENCOUNTER — Other Ambulatory Visit (INDEPENDENT_AMBULATORY_CARE_PROVIDER_SITE_OTHER): Payer: Medicare Other

## 2016-03-09 DIAGNOSIS — E139 Other specified diabetes mellitus without complications: Secondary | ICD-10-CM

## 2016-03-09 DIAGNOSIS — I10 Essential (primary) hypertension: Secondary | ICD-10-CM

## 2016-03-09 LAB — HEPATIC FUNCTION PANEL
ALBUMIN: 4.1 g/dL (ref 3.5–5.2)
ALT: 8 U/L (ref 0–35)
AST: 15 U/L (ref 0–37)
Alkaline Phosphatase: 75 U/L (ref 39–117)
BILIRUBIN TOTAL: 0.7 mg/dL (ref 0.2–1.2)
Bilirubin, Direct: 0.1 mg/dL (ref 0.0–0.3)
Total Protein: 6.3 g/dL (ref 6.0–8.3)

## 2016-03-09 LAB — CBC WITH DIFFERENTIAL/PLATELET
BASOS PCT: 0.6 % (ref 0.0–3.0)
Basophils Absolute: 0 10*3/uL (ref 0.0–0.1)
EOS PCT: 4.2 % (ref 0.0–5.0)
Eosinophils Absolute: 0.3 10*3/uL (ref 0.0–0.7)
HCT: 45.3 % (ref 36.0–46.0)
HEMOGLOBIN: 15.2 g/dL — AB (ref 12.0–15.0)
LYMPHS ABS: 2.6 10*3/uL (ref 0.7–4.0)
Lymphocytes Relative: 37.6 % (ref 12.0–46.0)
MCHC: 33.5 g/dL (ref 30.0–36.0)
MCV: 90.3 fl (ref 78.0–100.0)
MONOS PCT: 10 % (ref 3.0–12.0)
Monocytes Absolute: 0.7 10*3/uL (ref 0.1–1.0)
NEUTROS PCT: 47.6 % (ref 43.0–77.0)
Neutro Abs: 3.3 10*3/uL (ref 1.4–7.7)
Platelets: 170 10*3/uL (ref 150.0–400.0)
RBC: 5.02 Mil/uL (ref 3.87–5.11)
RDW: 13.8 % (ref 11.5–15.5)
WBC: 6.9 10*3/uL (ref 4.0–10.5)

## 2016-03-09 LAB — POCT URINALYSIS DIPSTICK
Bilirubin, UA: NEGATIVE
Blood, UA: NEGATIVE
Glucose, UA: NEGATIVE
Ketones, UA: NEGATIVE
NITRITE UA: NEGATIVE
PROTEIN UA: NEGATIVE
Spec Grav, UA: 1.015
Urobilinogen, UA: 1
pH, UA: 5.5

## 2016-03-09 LAB — BASIC METABOLIC PANEL
BUN: 14 mg/dL (ref 6–23)
CHLORIDE: 103 meq/L (ref 96–112)
CO2: 30 mEq/L (ref 19–32)
Calcium: 9.8 mg/dL (ref 8.4–10.5)
Creatinine, Ser: 0.82 mg/dL (ref 0.40–1.20)
GFR: 73.3 mL/min (ref >60.00)
Glucose, Bld: 123 mg/dL — ABNORMAL HIGH (ref 70–99)
POTASSIUM: 4 meq/L (ref 3.5–5.1)
Sodium: 140 mEq/L (ref 135–145)

## 2016-03-09 LAB — TSH: TSH: 4.49 u[IU]/mL (ref 0.35–4.50)

## 2016-03-09 LAB — LIPID PANEL
CHOLESTEROL: 175 mg/dL (ref 0–200)
HDL: 63.9 mg/dL (ref >39.00)
LDL CALC: 92 mg/dL (ref 0–99)
NonHDL: 110.8
Total CHOL/HDL Ratio: 3
Triglycerides: 92 mg/dL (ref 0.0–149.0)
VLDL: 18.4 mg/dL (ref 0.0–40.0)

## 2016-03-09 LAB — MICROALBUMIN / CREATININE URINE RATIO
Creatinine,U: 109.5 mg/dL
MICROALB/CREAT RATIO: 1.9 mg/g (ref 0.0–30.0)
Microalb, Ur: 2.1 mg/dL — ABNORMAL HIGH (ref 0.0–1.9)

## 2016-03-09 LAB — HEMOGLOBIN A1C: Hgb A1c MFr Bld: 6.6 % — ABNORMAL HIGH (ref 4.6–6.5)

## 2016-03-14 ENCOUNTER — Encounter: Payer: Self-pay | Admitting: Family Medicine

## 2016-03-14 ENCOUNTER — Ambulatory Visit (INDEPENDENT_AMBULATORY_CARE_PROVIDER_SITE_OTHER): Payer: Medicare Other | Admitting: Family Medicine

## 2016-03-14 VITALS — BP 110/88 | Temp 98.5°F | Ht 70.0 in | Wt 188.0 lb

## 2016-03-14 DIAGNOSIS — F329 Major depressive disorder, single episode, unspecified: Secondary | ICD-10-CM

## 2016-03-14 DIAGNOSIS — E785 Hyperlipidemia, unspecified: Secondary | ICD-10-CM | POA: Diagnosis not present

## 2016-03-14 DIAGNOSIS — I10 Essential (primary) hypertension: Secondary | ICD-10-CM

## 2016-03-14 DIAGNOSIS — E139 Other specified diabetes mellitus without complications: Secondary | ICD-10-CM | POA: Diagnosis not present

## 2016-03-14 DIAGNOSIS — J4531 Mild persistent asthma with (acute) exacerbation: Secondary | ICD-10-CM

## 2016-03-14 DIAGNOSIS — F330 Major depressive disorder, recurrent, mild: Secondary | ICD-10-CM

## 2016-03-14 DIAGNOSIS — F32A Depression, unspecified: Secondary | ICD-10-CM

## 2016-03-14 DIAGNOSIS — J309 Allergic rhinitis, unspecified: Secondary | ICD-10-CM

## 2016-03-14 MED ORDER — ATORVASTATIN CALCIUM 40 MG PO TABS: 40.0000 mg | ORAL_TABLET | Freq: Every day | ORAL | Status: DC

## 2016-03-14 MED ORDER — HYDROCHLOROTHIAZIDE 12.5 MG PO TABS: 12.5000 mg | ORAL_TABLET | Freq: Every day | ORAL | Status: DC

## 2016-03-14 MED ORDER — LISINOPRIL 2.5 MG PO TABS: 2.5000 mg | ORAL_TABLET | Freq: Every day | ORAL | Status: DC

## 2016-03-14 MED ORDER — MONTELUKAST SODIUM 10 MG PO TABS: 10.0000 mg | ORAL_TABLET | Freq: Every day | ORAL | Status: DC

## 2016-03-14 MED ORDER — ALBUTEROL SULFATE (2.5 MG/3ML) 0.083% IN NEBU: 2.5000 mg | INHALATION_SOLUTION | Freq: Four times a day (QID) | RESPIRATORY_TRACT | Status: DC | PRN

## 2016-03-14 MED ORDER — DIAZEPAM 5 MG PO TABS: 5.0000 mg | ORAL_TABLET | Freq: Every day | ORAL | Status: DC

## 2016-03-14 MED ORDER — CITALOPRAM HYDROBROMIDE 20 MG PO TABS: ORAL_TABLET | ORAL | Status: DC

## 2016-03-14 MED ORDER — METFORMIN HCL 500 MG PO TABS: 500.0000 mg | ORAL_TABLET | Freq: Every day | ORAL | Status: DC

## 2016-03-14 MED ORDER — FLUTICASONE PROPIONATE 50 MCG/ACT NA SUSP: NASAL | Status: DC

## 2016-03-14 NOTE — Progress Notes (Signed)
Subjective:    Patient ID: Lynn [name], female    DOB: 10-Aug-1946, 70 y.o.   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 She has a history of allergic rhinitis and uses Zyrtec Flonase nasal spray daily. She also takes Lipitor and an aspirin tablet because history of hyperlipidemia.  She also takes Celexa 20 mg daily because of history of mild depression  Her asthma is under good control and she rarely uses the albuterol. She'll use that she gets a bad cold and starts wheezing.  She takes metformin 500 mg daily because a history of diabetes. Blood sugar is 123 A1c 6.6%.  Her BMI is 27.30 which classifies her as overweight. We've talked before about diet exercise and weight loss. She has difficulty walking because she's had a right knee replaced. Still this. She tries to go to water aerobics 3 times a week.  Cognitive function normal she [facility], home health safety reviewed no issues identified, no guns in the house, she does have a healthcare power of attorney and living well.  Vaccinations up-to-date  She gets routine eye care, mammogram, colonoscopy all up-to-date. She gets no dental care, she doesn't have insurance and can't afford it. Referred to Dr. Gloriann [name]   Review of Systems  Constitutional: Negative.   HENT: Negative.   Eyes: Negative.   Respiratory: Negative.   Cardiovascular: Negative.   Gastrointestinal: Negative.   Endocrine: Negative.   Genitourinary: Negative.   Musculoskeletal: Negative.   Skin: Negative.   Allergic/Immunologic: Negative.   Neurological: Negative.   Hematological: Negative.   Psychiatric/Behavioral: Negative.        Objective:   Physical Exam    Constitutional: She appears well-developed and well-nourished.  HENT:  Head: Normocephalic and atraumatic.  Right Ear: External ear normal.  Left Ear: External ear normal.  Nose: Nose normal.  Mouth/Throat: Oropharynx is clear and moist.  Eyes: EOM are normal. Pupils are equal, round, and reactive to light.  Neck: Normal range of motion. Neck supple. No JVD present. No tracheal deviation present. No thyromegaly present.  Cardiovascular: Normal rate, regular rhythm, normal heart sounds and intact distal pulses.  Exam reveals no gallop and no friction rub.   No murmur heard. No carotid neurologic bruits for full pulses 2+ and symmetrical  Pulmonary/Chest: Effort normal and breath sounds normal. No stridor. No respiratory distress. She has no wheezes. She has no rales. She exhibits no tenderness.  Abdominal: Soft. Bowel sounds are normal. She exhibits no distension and no mass. There is no tenderness. There is no rebound and no guarding.  Genitourinary:  Pelvic rectal breast exam done by gynecologist in February therefore not repeated  Musculoskeletal: Normal range of motion.  Lymphadenopathy:    She has no cervical adenopathy.  Neurological: She is alert. She has normal reflexes. No cranial nerve deficit. She exhibits normal muscle tone. Coordination normal.  Skin: Skin is warm and dry. No rash noted. No erythema. No pallor.  Total body skin exam normal  Psychiatric: She has a normal mood and affect. Her behavior is normal. Judgment and thought content normal.  Nursing note and vitals reviewed.         Assessment & Plan:  Hypertension at goal......... continue current therapy  Hyperlipidemia goal......Marland Kitchen LDL 92 continue current therapy  Allergic rhinitis..... Continue over-the-counter medication  History of asthma.......... now very rare and intermittent delay flares up when she gets a bad cold therefore the albuterol nebulizer when necessary  History of mild  depression............ continue Celexa  Diabetes type 2......... under good control continue current therapy  Overweight......... again discussed.discussed diet exercise and weight loss

## 2016-03-14 NOTE — Progress Notes (Signed)
Pre visit review using our clinic review tool, if applicable. No additional management support is needed unless otherwise documented below in the visit note. 

## 2016-03-14 NOTE — Patient Instructions (Signed)
Continue current medications  [facility]  Follow-up in one year sooner if any problems  Tommi Rumps or Almyra Free are 2 new adult nurse practitioners or Dr. [place]

## 2016-03-16 ENCOUNTER — Other Ambulatory Visit: Payer: Medicare Other

## 2016-03-22 ENCOUNTER — Encounter: Payer: Medicare Other | Admitting: Family Medicine

## 2016-03-29 MED FILL — metFORMIN HCL 500 MG TABS: 500 | 90 days supply | Qty: 90 | Fill #2

## 2016-04-05 MED FILL — ZOLPIDEM TARTRATE 5 MG TAB: 5 | 60 days supply | Qty: 60 | Fill #1

## 2016-04-05 MED FILL — ACCU-CHEK SMARTVIEW TEST ST: 90 days supply | Qty: 100 | Fill #1

## 2016-04-11 ENCOUNTER — Other Ambulatory Visit: Payer: Medicare Other

## 2016-04-18 ENCOUNTER — Encounter: Payer: Medicare Other | Admitting: Family Medicine

## 2016-05-04 MED FILL — ATORVASTATIN 40 MG TABLET: 40 | 30 days supply | Qty: 30 | Fill #3

## 2016-05-18 MED FILL — CITALOPRAM HBR 20 MG TABLET: 20 | 90 days supply | Qty: 90 | Fill #0

## 2016-05-18 MED FILL — MONTELUKAST SOD 10 MG TAB: 10 | 90 days supply | Qty: 90 | Fill #0

## 2016-05-26 ENCOUNTER — Other Ambulatory Visit: Payer: Self-pay | Admitting: *Deleted

## 2016-05-26 ENCOUNTER — Other Ambulatory Visit: Payer: Self-pay | Admitting: Adult Health

## 2016-05-26 MED ORDER — LISINOPRIL-HYDROCHLOROTHIAZIDE 20-25 MG PO TABS: ORAL_TABLET | ORAL | Status: DC

## 2016-05-26 MED FILL — LISINOPRIL-HCTZ 20-25 MG TA: 20-25 | 90 days supply | Qty: 45 | Fill #0

## 2016-05-26 NOTE — Telephone Encounter (Signed)
Received Rx refill request for lisinopril-hctz 20-25 mg tablet to [facility]  Patient last seen by PCP on 03/2016  Rx meets refill protocol. Will refill now. Patient aware and is at pharmacy waiting for refill.

## 2016-06-07 MED FILL — ATORVASTATIN 40 MG TABLET: 40 | 90 days supply | Qty: 90 | Fill #0

## 2016-06-07 MED FILL — ZOLPIDEM TARTRATE 5 MG TAB: 5 | 60 days supply | Qty: 60 | Fill #2

## 2016-07-11 ENCOUNTER — Other Ambulatory Visit: Payer: Self-pay | Admitting: Family Medicine

## 2016-07-11 MED FILL — metFORMIN HCL 500 MG TABS: 500 | 90 days supply | Qty: 90 | Fill #0

## 2016-07-24 ENCOUNTER — Ambulatory Visit (INDEPENDENT_AMBULATORY_CARE_PROVIDER_SITE_OTHER): Payer: Medicare Other | Admitting: Emergency Medicine

## 2016-07-24 DIAGNOSIS — Z23 Encounter for immunization: Secondary | ICD-10-CM | POA: Diagnosis not present

## 2016-08-07 ENCOUNTER — Other Ambulatory Visit: Payer: Self-pay | Admitting: Family Medicine

## 2016-08-07 ENCOUNTER — Institutional Professional Consult (permissible substitution): Payer: Medicare Other | Admitting: Internal Medicine

## 2016-08-07 MED FILL — ZOLPIDEM TARTRATE 5 MG TAB: 5 | 60 days supply | Qty: 60 | Fill #0

## 2016-08-25 MED FILL — LISINOPRIL-HCTZ 20-25 MG TA: 20-25 | 90 days supply | Qty: 45 | Fill #0

## 2016-08-25 MED FILL — ATORVASTATIN 40 MG TABLET: 40 | 90 days supply | Qty: 90 | Fill #1

## 2016-08-28 ENCOUNTER — Institutional Professional Consult (permissible substitution): Payer: Medicare Other | Admitting: Internal Medicine

## 2016-08-30 MED FILL — CITALOPRAM HBR 20 MG TABLET: 20 | 90 days supply | Qty: 90 | Fill #1

## 2016-09-01 ENCOUNTER — Ambulatory Visit (INDEPENDENT_AMBULATORY_CARE_PROVIDER_SITE_OTHER): Payer: Medicare Other | Admitting: Internal Medicine

## 2016-09-01 ENCOUNTER — Encounter: Payer: Self-pay | Admitting: Internal Medicine

## 2016-09-01 VITALS — BP 116/70 | HR 81 | Ht 69.0 in | Wt 208.0 lb

## 2016-09-01 DIAGNOSIS — Z79899 Other long term (current) drug therapy: Secondary | ICD-10-CM

## 2016-09-01 DIAGNOSIS — Z8709 Personal history of other diseases of the respiratory system: Secondary | ICD-10-CM

## 2016-09-01 DIAGNOSIS — Z87891 Personal history of nicotine dependence: Secondary | ICD-10-CM | POA: Insufficient documentation

## 2016-09-01 DIAGNOSIS — Z87898 Personal history of other specified conditions: Secondary | ICD-10-CM

## 2016-09-01 DIAGNOSIS — R911 Solitary pulmonary nodule: Secondary | ICD-10-CM | POA: Diagnosis not present

## 2016-09-01 LAB — NITRIC OXIDE: Nitric Oxide: 22

## 2016-09-01 NOTE — Addendum Note (Signed)
Addended by: Collier Salina on: 09/01/2016 12:31 PM   Modules accepted: Orders

## 2016-09-01 NOTE — Patient Instructions (Addendum)
ICD-9-CM ICD-10-CM   1. History of asthma V12.69 Z87.09   2. Stopped smoking with greater than 40 pack year history V15.82 Z87.891 CT Chest Wo Contrast  3. On angiotensin-converting enzyme (ACE) inhibitors V07.52 Z79.899   4. History of wheezing V12.69 Z87.898   5. Lung nodule, solitary 793.11 R91.1 CT Chest Wo Contrast   Unclear if there is asthma. Might have copd or both asthma/copd. Lisinopril could be making things worse  PLAN Stop ace inhibitor - keep eye on bp during this time Full  PFT in 3-4 weeks after stopping ace inhibitor Do CT chest anytime next 3-4 weeks' contniue singulair and nasal steroid  Followup - return to see me oR APP after pft and CT  in 3-4 weeks

## 2016-09-01 NOTE — Progress Notes (Signed)
Subjective:    Patient ID: Lynn [name], female    DOB: 01-14-1946, 70 y.o.   MRN: [id number]  HPI   OV 09/01/2016  Chief Complaint  Patient presents with  . Pulmonary Consult    self referral for Asthma.    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 She is here for an asthma evaluation he asthma eval and mmgt her primary care physician Dr. Stevie Kern is retiring. She tells me that she has a 74-pack-year smoking history but quit approximately 16 years ago but approximately 20 years ago in 1996 she finished up a cruise and on her return had acute bronchitis with wheezing and was given a diagnosis of asthma. She does not recollect any previous pulmonary function testing. She's been placed on Singulair but unclear for how long. Chart review shows that he asthma diagnosis is mild persistent. Since her original diagnosis of asthma she's never had any emergency room visits or hospitalizations or urgent care visits. However in the fall in the winter each issue I want her to acute exacerbation episodes which in her description upper he classic with fever, sputum, chest tightness, wheezing and feeling sick. He isn't treated with antibiotics and prednisone. Most recent episode was "winter 2016. Between episodes she says she stable without any nocturnal awakening or chest tightness or cough or albuterol use. Most recently approximately 3 years ago started having increased sinus congestion because of weather change and she feels she is wheezing a little bit more than baseline but currently controlled with an occasional albuterol at night.  Med review shows she is on ACE inhibitor's unclear how long she's been on it    Chest x-ray 10/31/2013 shows a left upper lobe lung nodule 11 mm. Review shows his been stable since 2008 v and in 2008 did not have metabolic activity on PET scan. Therefore this was deemed benign  CT chest  04/06/2010: Other than the nodule now comments on lung parenchyma   Labwork 03/09/2016: Shows normal creatinine 0.8 mg percent and hemoglobin 15.2 g percent.. 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   reports that she quit smoking about 16 years ago. Her smoking use included Cigarettes. She has a 74.00 pack-year smoking history. She has never used smokeless tobacco.  Past Surgical History:  Procedure Laterality Date  . CHOLECYSTECTOMY  1982   urosespsis post op  . JOINT REPLACEMENT  1998   1998 RT KNEE  +2009 REMOVED, REPLACED JOINT  . KNEE ARTHROSCOPY Right 1995   right, TNR  . left shoulder Left 2012  . [place]   fusion, ruptured disk  . TONSILLECTOMY  1952    Allergies  Allergen Reactions  . Morphine And Related Itching    Patient states that she can still take this medication it just makes her itch.    Immunization History  Administered Date(s) Administered  . Influenza Split 08/26/2012  . Influenza Whole 11/06/2004, 08/11/2009  . Influenza, High Dose Seasonal PF 07/28/2015, 07/24/2016  . Influenza,inj,Quad  PF,36+ Mos 07/29/2013, 07/06/2014  . Pneumococcal Conjugate-13 04/06/2014  . Pneumococcal Polysaccharide-23 11/06/2004, 04/02/2013  . Td 11/06/2002, 06/21/2010  . Tdap 06/15/2015  . Zoster 03/18/2008    Family History  Problem Relation Age of Onset  . Heart disease  Other   . Diabetes Other     diabeties  . Hyperlipidemia Other   . Hypertension Other   . Cancer Mother   . Diabetes Mother   . Heart disease Mother   . Hyperlipidemia Mother   . Hypertension Mother   . Hyperlipidemia Father   . Hypertension Father   . Colon cancer Maternal Aunt 75  . Colon cancer Maternal Uncle 82  . Colon cancer Maternal Uncle 80     Current Outpatient Prescriptions:  .  ACCU-CHEK SMARTVIEW test strip, TEST BLOOD SUGAR ONCE DAILY, Disp: 100 each, Rfl: 11 .  acetaminophen (TYLENOL) 325 MG tablet, Take 650 mg by mouth every 6 (six) hours as needed for moderate pain., Disp: , Rfl:  .  albuterol (PROVENTIL) (2.5 MG/3ML) 0.083% nebulizer solution, Take 3 mLs (2.5 mg total) by nebulization every 6 (six) hours as needed for wheezing., Disp: 3 mL, Rfl: 11 .  aspirin 81 MG tablet, Take 81 mg by mouth daily.  , Disp: , Rfl:  .  atorvastatin (LIPITOR) 40 MG tablet, Take 1 tablet (40 mg total) by mouth daily., Disp: 100 tablet, Rfl: 4 .  citalopram (CELEXA) 20 MG tablet, 1 by mouth each bedtime, Disp: 90 tablet, Rfl: 4 .  docusate sodium (COLACE) 100 MG capsule, Take 100 mg by mouth daily as needed. Reported on 10/21/2015, Disp: , Rfl:  .  fluticasone (FLONASE) 50 MCG/ACT nasal spray, 1 spray up each nostril  at bedtime, Disp: 16 g, Rfl: 6 .  hydrochlorothiazide (HYDRODIURIL) 12.5 MG tablet, Take 1 tablet (12.5 mg total) by mouth daily., Disp: 90 tablet, Rfl: 3 .  lisinopril-hydrochlorothiazide (PRINZIDE,ZESTORETIC) 20-25 MG tablet, TAKE 1/2 TABLET BY MOUTH DAILY, Disp: 45 tablet, Rfl: 1 .  metFORMIN (GLUCOPHAGE) 500 MG tablet, Take 1 tablet (500 mg total) by mouth daily with breakfast., Disp: 90 tablet, Rfl: 3 .  montelukast (SINGULAIR) 10 MG tablet, Take 1 tablet (10 mg total) by mouth at bedtime., Disp: 90 tablet, Rfl: 4 .  Multiple Vitamins-Minerals (OCUVITE PO), Take 1 tablet by mouth daily. , Disp: , Rfl:  .  zolpidem (AMBIEN) 5 MG tablet, TAKE 1 TABLET BY MOUTH AT  BEDTIME AS NEEDED FOR SLEEP, Disp: 60 tablet, Rfl: 1 .  lisinopril (ZESTRIL) 2.5 MG tablet, Take 1 tablet (2.5 mg total) by mouth daily., Disp: 100 tablet, Rfl: 3    Review of Systems  Constitutional: Negative for fever and unexpected weight change.  HENT: Negative for congestion, dental problem, ear pain, nosebleeds, postnasal drip, rhinorrhea, sinus pressure, sneezing, sore throat and trouble swallowing.   Eyes: Negative for redness and itching.  Respiratory: Negative for cough, chest tightness, shortness of breath and wheezing.   Cardiovascular: Negative for palpitations and leg swelling.  Gastrointestinal: Negative for nausea and vomiting.  Genitourinary: Negative for dysuria.  Musculoskeletal: Negative for joint swelling.  Skin: Negative for rash.  Neurological: Negative for headaches.  Hematological: Does not bruise/bleed easily.  Psychiatric/Behavioral: Negative for dysphoric mood. The patient is not nervous/anxious.        Objective:   Physical Exam  Constitutional: She is oriented to person, place, and time. She appears well-developed and well-nourished. No distress.  HENT:  Head: Normocephalic and atraumatic.  Right Ear: External ear normal.  Left Ear: External ear normal.  Mouth/Throat: Oropharynx is clear and moist. No oropharyngeal exudate.  Eyes: Conjunctivae and EOM are normal. Pupils are equal, round, and reactive to light. Right eye exhibits no discharge. Left eye exhibits no discharge. No scleral icterus.  Neck: Normal range of motion. Neck supple. No JVD present. No tracheal deviation present. No thyromegaly present.  Cardiovascular: Normal rate, regular rhythm, normal heart sounds and intact distal pulses.  Exam reveals no gallop and no friction rub.   No murmur heard. Pulmonary/Chest: Effort normal and breath sounds normal. No respiratory distress. She has no wheezes. She has no rales. She exhibits no tenderness.  She has occasional scattered wheeze  Abdominal:  Soft. Bowel sounds are normal. She exhibits no distension and no mass. There is no tenderness. There is no rebound and no guarding.  Musculoskeletal: Normal range of motion. She exhibits no edema or tenderness.  Lymphadenopathy:    She has no cervical adenopathy.  Neurological: She is alert and oriented to person, place, and time. She has normal reflexes. No cranial nerve deficit. She exhibits normal muscle tone. Coordination normal.  Skin: Skin is warm and dry. No rash noted. She is not diaphoretic. No erythema. No pallor.  Psychiatric: She has a normal mood and affect. Her behavior is normal. Judgment and thought content normal.  Vitals reviewed.   Vitals:   09/01/16 1010  BP: 116/70  Pulse: 81  SpO2: 98%  Weight: 208 lb (94.3 kg)  Height: 5\' 9"  (1.753 m)    Estimated body mass index is 30.72 kg/m as calculated from the following:   Height as of this encounter: 5\' 9"  (1.753 m).   Weight as of this encounter: 208 lb (94.3 kg).       Assessment & Plan:     ICD-9-CM ICD-10-CM   1. History of asthma V12.69 Z87.09   2. Stopped smoking with greater than 40 pack year history V15.82 Z87.891 CT Chest Wo Contrast  3. On angiotensin-converting enzyme (ACE) inhibitors V07.52 Z79.899   4. History of wheezing V12.69 Z87.898   5. Lung nodule, solitary 793.11 R91.1 CT Chest Wo Contrast    Unclear if there is asthma. Might have copd or both asthma/copd. Lisinopril could be making things worse  PLAN Stop ace inhibitor - keep eye on bp during this time Full  PFT in 3-4 weeks after stopping ace inhibitor Do CT chest anytime next 3-4 weeks' contniue singulair and nasal steroid  Followup - return to see me oR APP after pft and CT  in 3-4 weeks   Dr. Brand Males, M.D., [facility].C.P Pulmonary and Critical Care Medicine Staff Physician [place] [facility] Pager: [phone], If no answer or between  15:00h - 7:00h: call [phone]  09/01/2016 10:49 AM

## 2016-09-05 ENCOUNTER — Inpatient Hospital Stay: Admission: RE | Admit: 2016-09-05 | Payer: Medicare Other | Source: Ambulatory Visit

## 2016-09-15 ENCOUNTER — Ambulatory Visit (INDEPENDENT_AMBULATORY_CARE_PROVIDER_SITE_OTHER)
Admission: RE | Admit: 2016-09-15 | Discharge: 2016-09-15 | Disposition: A | Discharge status: Home / Self Care | Payer: Medicare Other | Source: Ambulatory Visit | Attending: Internal Medicine | Admitting: Internal Medicine

## 2016-09-15 DIAGNOSIS — Z87891 Personal history of nicotine dependence: Secondary | ICD-10-CM

## 2016-09-15 DIAGNOSIS — R911 Solitary pulmonary nodule: Secondary | ICD-10-CM

## 2016-09-22 ENCOUNTER — Ambulatory Visit (INDEPENDENT_AMBULATORY_CARE_PROVIDER_SITE_OTHER): Payer: Medicare Other | Admitting: Adult Health

## 2016-09-22 ENCOUNTER — Encounter: Payer: Self-pay | Admitting: Adult Health

## 2016-09-22 ENCOUNTER — Ambulatory Visit (HOSPITAL_COMMUNITY)
Admission: RE | Admit: 2016-09-22 | Discharge: 2016-09-22 | Disposition: A | Discharge status: Home / Self Care | Payer: Medicare Other | Source: Ambulatory Visit | Attending: Internal Medicine | Admitting: Internal Medicine

## 2016-09-22 DIAGNOSIS — J4540 Moderate persistent asthma, uncomplicated: Secondary | ICD-10-CM

## 2016-09-22 DIAGNOSIS — Z8709 Personal history of other diseases of the respiratory system: Secondary | ICD-10-CM | POA: Diagnosis not present

## 2016-09-22 DIAGNOSIS — I10 Essential (primary) hypertension: Secondary | ICD-10-CM

## 2016-09-22 LAB — PULMONARY FUNCTION TEST
FEF 25-75 POST: 2.16 L/s
FEF 25-75 PRE: 1.51 L/s
FEF2575-%Change-Post: 43 %
FEF2575-%PRED-POST: 99 %
FEF2575-%Pred-Pre: 69 %
FEV1-%Change-Post: 8 %
FEV1-%PRED-POST: 69 %
FEV1-%Pred-Pre: 63 %
FEV1-POST: 1.9 L
FEV1-Pre: 1.75 L
FEV1FVC-%Change-Post: 2 %
FEV1FVC-%PRED-PRE: 102 %
FEV6-%CHANGE-POST: 6 %
FEV6-%PRED-POST: 68 %
FEV6-%Pred-Pre: 64 %
FEV6-Post: 2.37 L
FEV6-Pre: 2.23 L
FEV6FVC-%CHANGE-POST: 0 %
FEV6FVC-%PRED-PRE: 103 %
FEV6FVC-%Pred-Post: 104 %
FVC-%CHANGE-POST: 5 %
FVC-%Pred-Post: 65 %
FVC-%Pred-Pre: 62 %
FVC-Post: 2.37 L
FVC-Pre: 2.25 L
POST FEV1/FVC RATIO: 80 %
Post FEV6/FVC ratio: 100 %
Pre FEV1/FVC ratio: 78 %
Pre FEV6/FVC Ratio: 99 %

## 2016-09-22 MED ORDER — VALSARTAN-HYDROCHLOROTHIAZIDE 160-25 MG PO TABS
0.5000 | ORAL_TABLET | Freq: Every day | ORAL | 5 refills | Status: DC
Start: 2016-09-22 — End: 2017-03-28

## 2016-09-22 MED ORDER — BECLOMETHASONE DIPROPIONATE 40 MCG/ACT IN AERS: 2.0000 | INHALATION_SPRAY | Freq: Two times a day (BID) | RESPIRATORY_TRACT | 4 refills | Status: DC

## 2016-09-22 MED ORDER — BECLOMETHASONE DIPROPIONATE 40 MCG/ACT IN AERS: 2.0000 | INHALATION_SPRAY | Freq: Two times a day (BID) | RESPIRATORY_TRACT | 0 refills | Status: DC

## 2016-09-22 MED ORDER — ALBUTEROL SULFATE (2.5 MG/3ML) 0.083% IN NEBU
2.5000 mg | INHALATION_SOLUTION | Freq: Once | RESPIRATORY_TRACT | Status: AC
  Administered 2016-09-22: 2.5 mg via RESPIRATORY_TRACT

## 2016-09-22 MED FILL — VALSARTAN-HCTZ 160-25 MG TA: 160-25 | 60 days supply | Qty: 30 | Fill #0

## 2016-09-22 NOTE — Assessment & Plan Note (Addendum)
Change off ACE  Diovan rx  follow up with PCP for b/p control  Discussed CT results as did show possible CAD /aorta atherosclerosis , advised to discuss risk statification with PCP to see if further testing is indicated as she denies chest pain.

## 2016-09-22 NOTE — Progress Notes (Signed)
Subjective:    Patient ID: Lynn [name], female    DOB: 03-23-1946, 70 y.o.   MRN: [id number]  HPI 70 yo female former smoker seen for pulmonary evaluation 09/01/16 for asthma evaluation   09/22/2016 Follow up ; Asthma  Pt returns for 3 week follow up for asthma. Patient was seen last visit for pulmonary consultation for asthma evaluation. Patient says that she's been having frequent asthma layers that require prednisone and antibiotics.. Patient was set up for a CT chest that was done on 09/15/2016 that showed stable left upper lobe nodules since 2011. Mild emphysematous changes. 100 function tests showed an FEV1 at 69%, ratio 80, FVC 65%. No significant bronchodilator response. Mid flows were slightly decreased with a positive reversibility. Patient was recommended to stop her ACE inhibitor. Last visit. Patient did briefly but restarted because her blood pressure went back up. It was felt that her ACE inhibitor may be aggravating her asthma with frequent cough and exacerbations.. She denies any chest pain, orthopnea, PND, or increased leg swelling FeNO testing last ov was not elevated at 22 . Marland Kitchen    Past Medical History:  Diagnosis Date  . ASTHMA 03/14/2007   NO PROBELEM IN 12 YRS  . Blood dyscrasia    [place] "FREE BLEEDER"  . 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 DEGENERATION, MACULAR NOS 03/14/2007  . DEPRESSION 03/14/2007  . DIABETES MELLITUS, TYPE II 03/14/2007  . Diarrhea 05/31/2007  . [place] DISEASE, LUMBAR 05/25/2009  . DIVERTICULOSIS OF COLON 01/11/2009  . HYPERLIPIDEMIA 06/21/2010  . HYPERTENSION 03/14/2007  . Lung abnormality    LT LUNG WITH SPOT HAS BEEN FOLLOWED X 3 YRS DR Arlyce Dice  . SEBORRHEIC KERATOSIS, INFLAMED 12/02/2010  . Unspecified hearing loss 01/13/2010  . VERTIGO 11/10/2010   Current Outpatient Prescriptions on File Prior to Visit  Medication Sig Dispense Refill  . ACCU-CHEK SMARTVIEW test strip TEST BLOOD SUGAR ONCE DAILY 100 each 11  . acetaminophen  (TYLENOL) 325 MG tablet Take 650 mg by mouth every 6 (six) hours as needed for moderate pain.    Marland Kitchen albuterol (PROVENTIL) (2.5 MG/3ML) 0.083% nebulizer solution Take 3 mLs (2.5 mg total) by nebulization every 6 (six) hours as needed for wheezing. 3 mL 11  . aspirin 81 MG tablet Take 81 mg by mouth daily.      Marland Kitchen atorvastatin (LIPITOR) 40 MG tablet Take 1 tablet (40 mg total) by mouth daily. 100 tablet 4  . citalopram (CELEXA) 20 MG tablet 1 by mouth each bedtime 90 tablet 4  . docusate sodium (COLACE) 100 MG capsule Take 100 mg by mouth daily as needed. Reported on 10/21/2015    . fluticasone (FLONASE) 50 MCG/ACT nasal spray 1 spray up each nostril  at bedtime 16 g 6  . lisinopril-hydrochlorothiazide (PRINZIDE,ZESTORETIC) 20-25 MG tablet TAKE 1/2 TABLET BY MOUTH DAILY 45 tablet 1  . metFORMIN (GLUCOPHAGE) 500 MG tablet Take 1 tablet (500 mg total) by mouth daily with breakfast. 90 tablet 3  . montelukast (SINGULAIR) 10 MG tablet Take 1 tablet (10 mg total) by mouth at bedtime. 90 tablet 4  . Multiple Vitamins-Minerals (OCUVITE PO) Take 1 tablet by mouth daily.     Marland Kitchen zolpidem (AMBIEN) 5 MG tablet TAKE 1 TABLET BY MOUTH AT BEDTIME AS NEEDED FOR SLEEP 60 tablet 1   No current facility-administered medications on file prior to visit.      Review of Systems Constitutional:   No  weight loss, night sweats,  Fevers, chills,  fatigue, or  lassitude.  HEENT:   No headaches,  Difficulty swallowing,  Tooth/dental problems, or  Sore throat,                No sneezing, itching, ear ache, nasal congestion, post nasal drip,   CV:  No chest pain,  Orthopnea, PND, swelling in lower extremities, anasarca, dizziness, palpitations, syncope.   GI  No heartburn, indigestion, abdominal pain, nausea, vomiting, diarrhea, change in bowel habits, loss of appetite, bloody stools.   Resp:    No chest wall deformity  Skin: no rash or lesions.  GU: no dysuria, change in color of urine, no urgency or frequency.  No  flank pain, no hematuria   MS:  No joint pain or swelling.  No decreased range of motion.  No back pain.  Psych:  No change in mood or affect. No depression or anxiety.  No memory loss.         Objective:   Physical Exam  Vitals:   09/22/16 1632  BP: 108/60  Pulse: 75  Temp: 98 F (36.7 C)  TempSrc: Oral  SpO2: 98%  Weight: 206 lb (93.4 kg)  Height: 5\' 9"  (1.753 m)   GEN: A/Ox3; pleasant , NAD, well nourished    HEENT:  [place]/AT,  EACs-clear, TMs-wnl, NOSE-clear, THROAT-clear, no lesions, no postnasal drip or exudate noted.   NECK:  Supple w/ fair ROM; no JVD; normal carotid impulses w/o bruits; no thyromegaly or nodules palpated; no lymphadenopathy.    RESP  Clear  P & A; w/o, wheezes/ rales/ or rhonchi. no accessory muscle use, no dullness to percussion  CARD:  RRR, no m/r/g  , no peripheral edema, pulses intact, no cyanosis or clubbing.  GI:   Soft & nt; nml bowel sounds; no organomegaly or masses detected.   Musco: Warm bil, no deformities or joint swelling noted.   Neuro: alert, no focal deficits noted.    Skin: Warm, no lesions or rashes  Layali Freund NP-C  [place]  09/22/2016      Assessment & Plan:

## 2016-09-22 NOTE — Patient Instructions (Addendum)
Begin QVAR 40 2 puffs Twice daily  , rinse after use.  Delsym 2 tsp Twice daily  As needed  Cough .  Stop Lisinopril /HCTZ  Begin Diovan 160/25mg  1/2 daily .  Follow up with your primary MD for blood pressure and plaque build up on CT scan in arteries.  follow up Dr. Chase Caller in 6-8 weeks and As needed   Please contact office for sooner follow up if symptoms do not improve or worsen or seek emergency care

## 2016-09-22 NOTE — Assessment & Plan Note (Signed)
Suspect asthma component w/ reversibility in small airway on PFT  ACE may be aggravating.    Plan  Patient Instructions  Begin QVAR 40 2 puffs Twice daily  , rinse after use.  Delsym 2 tsp Twice daily  As needed  Cough .  Stop Lisinopril /HCTZ  Begin Diovan 160/25mg  1/2 daily .  Follow up with your primary MD for blood pressure and plaque build up on CT scan in arteries.  follow up Dr. Chase Caller in 6-8 weeks and As needed   Please contact office for sooner follow up if symptoms do not improve or worsen or seek emergency care

## 2016-09-29 ENCOUNTER — Telehealth: Payer: Self-pay | Admitting: Adult Health

## 2016-09-29 NOTE — Telephone Encounter (Signed)
TP---the qvar 40 was sent in for this pt.  This is not covered by her insurance and they stated that we could either initiate the PA or call in flovent or arnuity.  TP please advise. Thanks

## 2016-10-02 MED ORDER — FLUTICASONE PROPIONATE HFA 110 MCG/ACT IN AERO: 2.0000 | INHALATION_SPRAY | Freq: Two times a day (BID) | RESPIRATORY_TRACT | 6 refills | Status: DC

## 2016-10-02 NOTE — Telephone Encounter (Signed)
Can change to flovent 110 2 puffs Twice daily  #1 , 5 refills  Cont w/ planned follow up

## 2016-10-02 NOTE — Telephone Encounter (Signed)
flovent has been sent in to the pts pharmacy.

## 2016-10-03 MED FILL — ZOLPIDEM TARTRATE 5 MG TAB: 5 | 60 days supply | Qty: 60 | Fill #1

## 2016-10-16 MED FILL — metFORMIN HCL 500 MG TABS: 500 | 90 days supply | Qty: 90 | Fill #1

## 2016-10-31 ENCOUNTER — Ambulatory Visit (INDEPENDENT_AMBULATORY_CARE_PROVIDER_SITE_OTHER): Payer: Medicare Other | Admitting: Adult Health

## 2016-10-31 ENCOUNTER — Encounter: Payer: Self-pay | Admitting: Adult Health

## 2016-10-31 DIAGNOSIS — J4521 Mild intermittent asthma with (acute) exacerbation: Secondary | ICD-10-CM

## 2016-10-31 MED ORDER — ALBUTEROL SULFATE (2.5 MG/3ML) 0.083% IN NEBU: 2.5000 mg | INHALATION_SOLUTION | Freq: Four times a day (QID) | RESPIRATORY_TRACT | 2 refills | Status: DC | PRN

## 2016-10-31 MED ORDER — PREDNISONE 10 MG PO TABS: ORAL_TABLET | ORAL | 0 refills | Status: DC

## 2016-10-31 MED ORDER — AMOXICILLIN-POT CLAVULANATE 875-125 MG PO TABS: 1.0000 | ORAL_TABLET | Freq: Two times a day (BID) | ORAL | 0 refills | Status: AC

## 2016-10-31 NOTE — Assessment & Plan Note (Signed)
Flare with URI /bronchitis   Plan  Patient Instructions  Augmentin 875mg  Twice daily  For 1 week , take w/ food.  Mucinex DM Twice daily  As needed  Cough/congestion .  Prednisone taper over next week.  Continue on QVAR or Flovent 2 puffs Twice daily  .  Follow up with Dr. Chase Caller in Jan as planned and As needed   Please contact office for sooner follow up if symptoms do not improve or worsen or seek emergency care

## 2016-10-31 NOTE — Progress Notes (Signed)
@Patient  ID: Lynn [name], female    DOB: 06-05-1946, 70 y.o.   MRN: [id number]  Chief Complaint  Patient presents with  . Acute Visit    Cough     Referring provider: Dorena Cookey, MD  HPI: 70 yo female former smoker followed for Asthma   TEST /Events  CT chest that was done on 09/15/2016 that showed stable left upper lobe nodules since 2011. Mild emphysematous changes. 100 function tests showed an FEV1 at 69%, ratio 80, FVC 65%. No significant bronchodilator response. Mid flows were slightly decreased with a positive reversibility.  ACE inhibitor stopped 09/2016   10/31/2016 Acute OV  Pt presents for an acute office visit . Complains of 2-3 days of cough , congestion with thick green mucus , wheezing , sinus drainage and low grade fever (100) . No body aches. Good appetite . She denies chest pain , orthopnea, edema .  Has taken tylenol .    She was seen 6 weeks ago, started on QVAR for asthma . ACE inhibitor stopped.  She noticed great improvement with resolution of wheezing and cough . Was doing great until 2 days ago.  Insurance will not cover QVAR. It will only cover Flovent . She will change to this when done with QVAR .    Allergies  Allergen Reactions  . Morphine And Related Itching    Patient states that she can still take this medication it just makes her itch.    Immunization History  Administered Date(s) Administered  . Influenza Split 08/26/2012  . Influenza Whole 11/06/2004, 08/11/2009  . Influenza, High Dose Seasonal PF 07/28/2015, 07/24/2016  . Influenza,inj,Quad PF,36+ Mos 07/29/2013, 07/06/2014  . Pneumococcal Conjugate-13 04/06/2014  . Pneumococcal Polysaccharide-23 11/06/2004, 04/02/2013  . Td 11/06/2002, 06/21/2010  . Tdap 06/15/2015  . Zoster 03/18/2008    Past Medical History:  Diagnosis Date  . ASTHMA 03/14/2007   NO PROBELEM IN 12 YRS  . Blood dyscrasia    VON WILLIBRANDE "FREE BLEEDER"  . COLONIC POLYPS, ADENOMATOUS, HX OF  01/11/2009  . DEGENERATION, MACULAR NOS 03/14/2007  . DEPRESSION 03/14/2007  . DIABETES MELLITUS, TYPE II 03/14/2007  . Diarrhea 05/31/2007  . [place] DISEASE, LUMBAR 05/25/2009  . DIVERTICULOSIS OF COLON 01/11/2009  . HYPERLIPIDEMIA 06/21/2010  . HYPERTENSION 03/14/2007  . Lung abnormality    LT LUNG WITH SPOT HAS BEEN FOLLOWED X 3 YRS DR Arlyce Dice  . SEBORRHEIC KERATOSIS, INFLAMED 12/02/2010  . Unspecified hearing loss 01/13/2010  . VERTIGO 11/10/2010    Tobacco History: History  Smoking Status  . Former Smoker  . Packs/day: 2.00  . Years: 37.00  . Types: Cigarettes  . Quit date: 09/19/1999  Smokeless Tobacco  . Never Used   Counseling given: Not Answered   Outpatient Encounter Prescriptions as of 10/31/2016  Medication Sig  . ACCU-CHEK SMARTVIEW test strip TEST BLOOD SUGAR ONCE DAILY  . acetaminophen (TYLENOL) 325 MG tablet Take 650 mg by mouth every 6 (six) hours as needed for moderate pain.  Marland Kitchen albuterol (PROVENTIL) (2.5 MG/3ML) 0.083% nebulizer solution Take 3 mLs (2.5 mg total) by nebulization every 6 (six) hours as needed for wheezing.  Marland Kitchen amoxicillin-clavulanate (AUGMENTIN) 875-125 MG tablet Take 1 tablet by mouth 2 (two) times daily.  Marland Kitchen aspirin 81 MG tablet Take 81 mg by mouth daily.    Marland Kitchen atorvastatin (LIPITOR) 40 MG tablet Take 1 tablet (40 mg total) by mouth daily.  . citalopram (CELEXA) 20 MG tablet 1 by mouth each bedtime  . docusate  sodium (COLACE) 100 MG capsule Take 100 mg by mouth daily as needed. Reported on 10/21/2015  . fluticasone (FLONASE) 50 MCG/ACT nasal spray 1 spray up each nostril  at bedtime  . fluticasone (FLOVENT HFA) 110 MCG/ACT inhaler Inhale 2 puffs into the lungs 2 (two) times daily.  . metFORMIN (GLUCOPHAGE) 500 MG tablet Take 1 tablet (500 mg total) by mouth daily with breakfast.  . montelukast (SINGULAIR) 10 MG tablet Take 1 tablet (10 mg total) by mouth at bedtime.  . Multiple Vitamins-Minerals (OCUVITE PO) Take 1 tablet by mouth daily.   . predniSONE  (DELTASONE) 10 MG tablet 4 tabs for 2 days, then 3 tabs for 2 days, 2 tabs for 2 days, then 1 tab for 2 days, then stop  . valsartan-hydrochlorothiazide (DIOVAN HCT) 160-25 MG tablet Take 0.5 tablets by mouth daily.  Marland Kitchen zolpidem (AMBIEN) 5 MG tablet TAKE 1 TABLET BY MOUTH AT BEDTIME AS NEEDED FOR SLEEP   No facility-administered encounter medications on file as of 10/31/2016.      Review of Systems  Constitutional:   No  weight loss, night sweats,  Fevers, chills, fatigue, or  lassitude.  HEENT:   No headaches,  Difficulty swallowing,  Tooth/dental problems, or  Sore throat,                No sneezing, itching, ear ache,  +nasal congestion, post nasal drip,   CV:  No chest pain,  Orthopnea, PND, swelling in lower extremities, anasarca, dizziness, palpitations, syncope.   GI  No heartburn, indigestion, abdominal pain, nausea, vomiting, diarrhea, change in bowel habits, loss of appetite, bloody stools.   Resp:  .  No chest wall deformity  Skin: no rash or lesions.  GU: no dysuria, change in color of urine, no urgency or frequency.  No flank pain, no hematuria   MS:  No joint pain or swelling.  No decreased range of motion.  No back pain.    Physical Exam  BP 118/78 (BP Location: Left Arm, Cuff Size: Normal)   Pulse 96   Temp 98.9 F (37.2 C)   Ht 5\' 9"  (1.753 m)   Wt 205 lb (93 kg) Comment: per pt, refused scale weighing  SpO2 96%   BMI 30.27 kg/m   GEN: A/Ox3; pleasant , NAD    HEENT:  Banks/AT,  EACs-clear, TMs-wnl, NOSE-clear drainage , THROAT-clear, no lesions, no postnasal drip or exudate noted.   NECK:  Supple w/ fair ROM; no JVD; normal carotid impulses w/o bruits; no thyromegaly or nodules palpated; no lymphadenopathy.    RESP  Clear  P & A; w/o, wheezes/ rales/ or rhonchi. no accessory muscle use, no dullness to percussion  CARD:  RRR, no m/r/g, no peripheral edema, pulses intact, no cyanosis or clubbing.  GI:   Soft & nt; nml bowel sounds; no organomegaly or  masses detected.   Musco: Warm bil, no deformities or joint swelling noted.   Neuro: alert, no focal deficits noted.    Skin: Warm, no lesions or rashes  Psych:  No change in mood or affect. No depression or anxiety.  No memory loss.  Lab Results:  CBC    Component Value Date/Time   WBC 6.9 03/09/2016 0754   RBC 5.02 03/09/2016 0754   HGB 15.2 (H) 03/09/2016 0754   HCT 45.3 03/09/2016 0754   PLT 170.0 03/09/2016 0754   MCV 90.3 03/09/2016 0754   MCH 29.7 12/08/2014 0610   MCHC 33.5 03/09/2016 0754   RDW 13.8  03/09/2016 0754   LYMPHSABS 2.6 03/09/2016 0754   MONOABS 0.7 03/09/2016 0754   EOSABS 0.3 03/09/2016 0754   BASOSABS 0.0 03/09/2016 0754    BMET    Component Value Date/Time   NA 140 03/09/2016 0754   K 4.0 03/09/2016 0754   CL 103 03/09/2016 0754   CO2 30 03/09/2016 0754   GLUCOSE 123 (H) 03/09/2016 0754   BUN 14 03/09/2016 0754   CREATININE 0.82 03/09/2016 0754   CALCIUM 9.8 03/09/2016 0754   GFRNONAA 70 (L) 12/08/2014 0610   GFRAA 81 (L) 12/08/2014 0610    BNP No results found for: BNP  ProBNP No results found for: PROBNP  Imaging: No results found.   Assessment & Plan:   No problem-specific Assessment & Plan notes found for this encounter.     Rexene Edison, NP 10/31/2016

## 2016-10-31 NOTE — Addendum Note (Signed)
Addended by: Tyson Dense on: 10/31/2016 05:01 PM   Modules accepted: Orders

## 2016-10-31 NOTE — Patient Instructions (Signed)
Augmentin 875mg  Twice daily  For 1 week , take w/ food.  Mucinex DM Twice daily  As needed  Cough/congestion .  Prednisone taper over next week.  Continue on QVAR or Flovent 2 puffs Twice daily  .  Follow up with Dr. Chase Caller in Jan as planned and As needed   Please contact office for sooner follow up if symptoms do not improve or worsen or seek emergency care

## 2016-11-21 MED FILL — VALSARTAN-HCTZ 160-25 MG TA: 160-25 | 60 days supply | Qty: 30 | Fill #1

## 2016-11-21 MED FILL — CITALOPRAM HBR 20 MG TABLET: 20 | 90 days supply | Qty: 90 | Fill #2

## 2016-11-23 ENCOUNTER — Ambulatory Visit: Payer: Medicare Other | Admitting: Internal Medicine

## 2016-12-06 ENCOUNTER — Other Ambulatory Visit: Payer: Self-pay | Admitting: Family Medicine

## 2016-12-06 MED FILL — ATORVASTATIN 40 MG TABLET: 40 | 90 days supply | Qty: 90 | Fill #2

## 2016-12-07 ENCOUNTER — Ambulatory Visit: Payer: Medicare Other | Admitting: Internal Medicine

## 2016-12-07 ENCOUNTER — Other Ambulatory Visit: Payer: Self-pay | Admitting: Family Medicine

## 2016-12-07 MED FILL — ZOLPIDEM TARTRATE 5 MG TAB: 5 | 60 days supply | Qty: 60 | Fill #0

## 2017-01-16 MED FILL — VALSARTAN-HCTZ 160-25 MG TA: 160-25 | 60 days supply | Qty: 30 | Fill #2

## 2017-01-16 MED FILL — metFORMIN HCL 500 MG TABS: 500 | 90 days supply | Qty: 90 | Fill #2

## 2017-02-01 DIAGNOSIS — Z1231 Encounter for screening mammogram for malignant neoplasm of breast: Secondary | ICD-10-CM | POA: Diagnosis not present

## 2017-02-01 DIAGNOSIS — Z803 Family history of malignant neoplasm of breast: Secondary | ICD-10-CM | POA: Diagnosis not present

## 2017-03-02 MED FILL — CITALOPRAM HBR 20 MG TABLET: 20 | 90 days supply | Qty: 90 | Fill #3

## 2017-03-02 MED FILL — ATORVASTATIN 40 MG TABLET: 40 | 90 days supply | Qty: 90 | Fill #3

## 2017-03-15 ENCOUNTER — Other Ambulatory Visit: Payer: Medicare Other

## 2017-03-20 ENCOUNTER — Encounter: Payer: Medicare Other | Admitting: Family Medicine

## 2017-03-28 ENCOUNTER — Encounter: Payer: Self-pay | Admitting: Family Medicine

## 2017-03-28 ENCOUNTER — Other Ambulatory Visit: Payer: Self-pay | Admitting: Family Medicine

## 2017-03-28 ENCOUNTER — Ambulatory Visit (INDEPENDENT_AMBULATORY_CARE_PROVIDER_SITE_OTHER): Payer: Medicare Other | Admitting: Family Medicine

## 2017-03-28 VITALS — BP 124/62 | Temp 98.6°F | Ht 68.0 in

## 2017-03-28 DIAGNOSIS — E109 Type 1 diabetes mellitus without complications: Secondary | ICD-10-CM

## 2017-03-28 DIAGNOSIS — F5101 Primary insomnia: Secondary | ICD-10-CM

## 2017-03-28 DIAGNOSIS — J4520 Mild intermittent asthma, uncomplicated: Secondary | ICD-10-CM | POA: Diagnosis not present

## 2017-03-28 DIAGNOSIS — F330 Major depressive disorder, recurrent, mild: Secondary | ICD-10-CM

## 2017-03-28 DIAGNOSIS — Z0000 Encounter for general adult medical examination without abnormal findings: Secondary | ICD-10-CM

## 2017-03-28 DIAGNOSIS — F32A Depression, unspecified: Secondary | ICD-10-CM

## 2017-03-28 DIAGNOSIS — E785 Hyperlipidemia, unspecified: Secondary | ICD-10-CM

## 2017-03-28 DIAGNOSIS — E139 Other specified diabetes mellitus without complications: Secondary | ICD-10-CM

## 2017-03-28 DIAGNOSIS — Z136 Encounter for screening for cardiovascular disorders: Secondary | ICD-10-CM | POA: Diagnosis not present

## 2017-03-28 DIAGNOSIS — F329 Major depressive disorder, single episode, unspecified: Secondary | ICD-10-CM

## 2017-03-28 DIAGNOSIS — I10 Essential (primary) hypertension: Secondary | ICD-10-CM

## 2017-03-28 LAB — CBC WITH DIFFERENTIAL/PLATELET
BASOS PCT: 0.9 % (ref 0.0–3.0)
Basophils Absolute: 0.1 10*3/uL (ref 0.0–0.1)
EOS PCT: 4.2 % (ref 0.0–5.0)
Eosinophils Absolute: 0.3 10*3/uL (ref 0.0–0.7)
HCT: 44.8 % (ref 36.0–46.0)
Hemoglobin: 14.8 g/dL (ref 12.0–15.0)
LYMPHS ABS: 2 10*3/uL (ref 0.7–4.0)
Lymphocytes Relative: 28 % (ref 12.0–46.0)
MCHC: 33 g/dL (ref 30.0–36.0)
MCV: 90.1 fl (ref 78.0–100.0)
MONOS PCT: 10.1 % (ref 3.0–12.0)
Monocytes Absolute: 0.7 10*3/uL (ref 0.1–1.0)
NEUTROS ABS: 3.9 10*3/uL (ref 1.4–7.7)
Neutrophils Relative %: 56.8 % (ref 43.0–77.0)
Platelets: 215 10*3/uL (ref 150.0–400.0)
RBC: 4.97 Mil/uL (ref 3.87–5.11)
RDW: 13 % (ref 11.5–15.5)
WBC: 7 10*3/uL (ref 4.0–10.5)

## 2017-03-28 LAB — BASIC METABOLIC PANEL
BUN: 19 mg/dL (ref 6–23)
CO2: 28 mEq/L (ref 19–32)
Calcium: 10.3 mg/dL (ref 8.4–10.5)
Chloride: 103 mEq/L (ref 96–112)
Creatinine, Ser: 0.83 mg/dL (ref 0.40–1.20)
GFR: 72.06 mL/min (ref >60.00)
Glucose, Bld: 126 mg/dL — ABNORMAL HIGH (ref 70–99)
POTASSIUM: 4.1 meq/L (ref 3.5–5.1)
SODIUM: 140 meq/L (ref 135–145)

## 2017-03-28 LAB — LIPID PANEL
CHOLESTEROL: 183 mg/dL (ref 0–200)
HDL: 62.3 mg/dL (ref >39.00)
LDL Cholesterol: 95 mg/dL (ref 0–99)
NonHDL: 120.35
TRIGLYCERIDES: 125 mg/dL (ref 0.0–149.0)
Total CHOL/HDL Ratio: 3
VLDL: 25 mg/dL (ref 0.0–40.0)

## 2017-03-28 LAB — POCT URINALYSIS DIPSTICK
BILIRUBIN UA: NEGATIVE
Blood, UA: NEGATIVE
Glucose, UA: NEGATIVE
KETONES UA: NEGATIVE
LEUKOCYTES UA: NEGATIVE
Nitrite, UA: NEGATIVE
PH UA: 6 (ref 5.0–8.0)
Protein, UA: NEGATIVE
Spec Grav, UA: 1.02 (ref 1.010–1.025)
Urobilinogen, UA: 0.2 E.U./dL

## 2017-03-28 LAB — HEPATIC FUNCTION PANEL
ALBUMIN: 4.5 g/dL (ref 3.5–5.2)
ALT: 10 U/L (ref 0–35)
AST: 16 U/L (ref 0–37)
Alkaline Phosphatase: 101 U/L (ref 39–117)
Bilirubin, Direct: 0.1 mg/dL (ref 0.0–0.3)
Total Bilirubin: 0.7 mg/dL (ref 0.2–1.2)
Total Protein: 6.3 g/dL (ref 6.0–8.3)

## 2017-03-28 LAB — HEMOGLOBIN A1C: Hgb A1c MFr Bld: 6.8 % — ABNORMAL HIGH (ref 4.6–6.5)

## 2017-03-28 LAB — TSH: TSH: 4.06 u[IU]/mL (ref 0.35–4.50)

## 2017-03-28 LAB — MICROALBUMIN / CREATININE URINE RATIO
CREATININE, U: 134.7 mg/dL
MICROALB UR: 3.1 mg/dL — AB (ref 0.0–1.9)
Microalb Creat Ratio: 2.3 mg/g (ref 0.0–30.0)

## 2017-03-28 MED ORDER — METFORMIN HCL 500 MG PO TABS: 500.0000 mg | ORAL_TABLET | Freq: Every day | ORAL | 4 refills | Status: DC

## 2017-03-28 MED ORDER — ATORVASTATIN CALCIUM 40 MG PO TABS: 40.0000 mg | ORAL_TABLET | Freq: Every day | ORAL | 4 refills | Status: DC

## 2017-03-28 MED ORDER — FLUTICASONE PROPIONATE 50 MCG/ACT NA SUSP: NASAL | 11 refills | Status: DC

## 2017-03-28 MED ORDER — LOSARTAN POTASSIUM-HCTZ 50-12.5 MG PO TABS: 1.0000 | ORAL_TABLET | Freq: Every day | ORAL | 4 refills | Status: DC

## 2017-03-28 MED ORDER — CITALOPRAM HYDROBROMIDE 20 MG PO TABS: ORAL_TABLET | ORAL | 4 refills | Status: DC

## 2017-03-28 MED ORDER — MONTELUKAST SODIUM 10 MG PO TABS: 10.0000 mg | ORAL_TABLET | Freq: Every day | ORAL | 4 refills | Status: DC

## 2017-03-28 MED ORDER — FLUTICASONE PROPIONATE 50 MCG/ACT NA SUSP: NASAL | 6 refills | Status: DC

## 2017-03-28 MED ORDER — BECLOMETHASONE DIPROPIONATE 40 MCG/ACT IN AERS: 2.0000 | INHALATION_SPRAY | Freq: Two times a day (BID) | RESPIRATORY_TRACT | 4 refills | Status: DC

## 2017-03-28 NOTE — Patient Instructions (Signed)
Stop the Diovan,,,,,,,,,,,,,,,,,, begin Hyzaar 1 tablet daily for blood pressure control  Continue other medications  The Qvar 40........Marland Kitchen 1 puff twice daily............ if you have a flare of been double the dose to 2 puffs twice daily............... take it every day  Return in one year for general checkup sooner if any problems  Labs today..........Marland Kitchen we'll call you if there is anything abnormal

## 2017-03-28 NOTE — Progress Notes (Signed)
Lynn [name] is a delightful 71 year old single female nonsmoker........Marland Kitchen retired Marine scientist,,,,,,, who comes in today for annual physical examination because of a history of allergic rhinitis, asthma, hyperlipidemia, mild depression, mild diabetes, hypertension, sleep dysfunction  She takes albuterol when necessary with a nebulizer when her asthma flares. She saw the folks in pulmonary last fall. They did numerous diagnostic studies which were all normal. They put on Qvar 42 puffs twice a day but she's not taking it. She says she only takes it when she needs it. I encouraged her to take it daily and not when necessary.  She takes Lipitor and an aspirin daily for hyperlipidemia  She takes Celexa 20 mg at bedtime for mild depression and Ambien 5 mg for sleep.  She takes metformin 500 mg before breakfast for mild diabetes. A1c last year was 6.6.  She was put on Diovan-HCT in pulmonary for hypertension but she can afford it it's too expensive. We'll split that out and give her the generic.  She gets routine eye care but no dental care. Recommend she see Dr. Gloriann Loan  Last mammogram was February 2017. Her mother had breast cancer site encouraged her to go yearly for mammography screening. Pelvic and Paps no longer indicated. She never had any problems for 50 years. GYN wise she is asymptomatic.  Vaccinations all up-to-date encouraged her to get the new shingles vaccine.  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 Cognitive function normal she is off her exercise program. Encouraged to restart the [facility] program. Home health safety reviewed no issues identified, no guns in the house,  BP 124/62   Temp 98.6 F (37 C) (Oral)   Ht 5\' 8"  (1.727 m)  Examination of the HEENT were negative neck was supple thyroid not enlarged no carotid bruits cardiopulmonary exam normal  breast exam normal abdominal exam normal except for scar right upper quadrant well-healed from previous cholecystectomy. Pelvic and rectal not indicated extremities normal skin normal peripheral pulses normal  #1 allergic rhinitis continue OTC meds  #2 asthma....... continue Singulair and use the Qvar 40 one puff twice a day daily not when necessary  #3 hyperlipidemia............ continue Lipitor and aspirin  History of mild depression.... Continue Celexa  #5 diabetes type 2........ continue metformin 500 mg daily  #6 hypertension.........Marland Kitchen DC the Diovan because it's too expensive............ give her the generic Hyzaar  Sleep dysfunction.....Marland Kitchen continue Ambien  #8....... Overweight.. Again encouraged diet exercise and weight loss  #9 status post right knee replacement.Marland Kitchen

## 2017-04-05 ENCOUNTER — Encounter: Payer: Self-pay | Admitting: Family Medicine

## 2017-04-05 MED ORDER — BECLOMETHASONE DIPROP HFA 40 MCG/ACT IN AERB: 2.0000 | INHALATION_SPRAY | Freq: Two times a day (BID) | RESPIRATORY_TRACT | 3 refills | Status: DC

## 2017-04-10 MED FILL — LOSARTAN POTASSIUM-HCTZ 50-: 50-12.5 | 90 days supply | Qty: 90 | Fill #0

## 2017-04-27 MED FILL — metFORMIN HCL 500 MG TABS: 500 | 90 days supply | Qty: 90 | Fill #0

## 2017-04-27 MED FILL — ZOLPIDEM TARTRATE 5 MG TAB: 5 | 60 days supply | Qty: 60 | Fill #1

## 2017-06-04 MED FILL — ATORVASTATIN 40 MG TABLET: 40 | 90 days supply | Qty: 90 | Fill #0

## 2017-06-04 MED FILL — CITALOPRAM HBR 20 MG TABLET: 20 | 90 days supply | Qty: 90 | Fill #0

## 2017-06-05 DIAGNOSIS — E119 Type 2 diabetes mellitus without complications: Secondary | ICD-10-CM | POA: Diagnosis not present

## 2017-06-05 DIAGNOSIS — H2513 Age-related nuclear cataract, bilateral: Secondary | ICD-10-CM | POA: Diagnosis not present

## 2017-06-05 DIAGNOSIS — H353132 Nonexudative age-related macular degeneration, bilateral, intermediate dry stage: Secondary | ICD-10-CM | POA: Diagnosis not present

## 2017-07-02 ENCOUNTER — Other Ambulatory Visit: Payer: Self-pay | Admitting: Family Medicine

## 2017-07-02 DIAGNOSIS — G8929 Other chronic pain: Secondary | ICD-10-CM | POA: Diagnosis not present

## 2017-07-02 DIAGNOSIS — M25512 Pain in left shoulder: Secondary | ICD-10-CM | POA: Diagnosis not present

## 2017-07-02 MED FILL — ZOLPIDEM TARTRATE 5 MG TAB: 5 | 60 days supply | Qty: 60 | Fill #0

## 2017-07-02 NOTE — Telephone Encounter (Signed)
Printed Rx faxed to [facility] at [phone].

## 2017-07-26 ENCOUNTER — Telehealth: Payer: Self-pay

## 2017-07-26 ENCOUNTER — Ambulatory Visit: Payer: Medicare Other

## 2017-07-26 NOTE — Telephone Encounter (Signed)
Call pt left a message for pt to call back for rescheduling of her flu vaccine

## 2017-07-27 ENCOUNTER — Ambulatory Visit (INDEPENDENT_AMBULATORY_CARE_PROVIDER_SITE_OTHER): Payer: Medicare Other

## 2017-07-27 ENCOUNTER — Encounter: Payer: Self-pay | Admitting: Family Medicine

## 2017-07-27 DIAGNOSIS — Z23 Encounter for immunization: Secondary | ICD-10-CM | POA: Diagnosis not present

## 2017-07-30 ENCOUNTER — Ambulatory Visit: Payer: Medicare Other

## 2017-08-10 ENCOUNTER — Telehealth: Payer: Self-pay | Admitting: *Deleted

## 2017-08-10 MED ORDER — BLOOD GLUCOSE MONITOR KIT: PACK | 0 refills | Status: DC

## 2017-08-10 MED FILL — ACCU-CHEK NANO SMARTVIEW ME: W/DEVICE | 1 days supply | Qty: 1 | Fill #0

## 2017-08-10 MED FILL — metFORMIN HCL 500 MG TABS: 500 | 90 days supply | Qty: 90 | Fill #1

## 2017-08-10 NOTE — Telephone Encounter (Signed)
Patient is requested someone to call the [facility] on [address] across from [place]. Patient states she broke her meter and she needs someone to send a prescription today and she needs it by 5:00, patient states she cannot check her sugar without it. Patient is very worried about getting this done. I made her aware I can get this message to Izora Gala Dr. Honor Junes nurse, patient requested for this message to go to Apolonio Schneiders because she has known her for years and she can get this done.   Patient states she was going to buy a meter but the pharmacy told her it will cost her hundreds of dollars and they told her she will need an order from her doctor, I made patient aware Dr Sherren Mocha is not here today and patient stated this needs to be done today.    Please advise patient   Message has been routed to [place] and [place]

## 2017-08-10 NOTE — Telephone Encounter (Signed)
Rx for a blood glucose meter was sent to pharmacy for refill. Patient was notified and stated that she will be picking up the Rx in the morning.

## 2017-08-30 ENCOUNTER — Other Ambulatory Visit: Payer: Self-pay

## 2017-08-30 NOTE — Patient Outreach (Signed)
[facility]) Care Management  08/30/2017  Lynn [name] [facility] 01/29/1946 [id number]   Medication Adherence call to Lynn [name] the reason for this call is because Lynn [name] is showing past due under [facility] Ins.on her losartan/hctz 50/12.5 mg spoke to patient she said she was getting samples from doctor's office and now she is only taking 1/2 tablet instead of 1 tablet a day patient still has medication she wont need any at this time.   [place] Management Direct Dial [phone]  Fax [phone] [email or web address]

## 2017-08-31 MED FILL — ATORVASTATIN 40 MG TABLET: 40 | 90 days supply | Qty: 90 | Fill #1

## 2017-08-31 MED FILL — CITALOPRAM HBR 20 MG TABLET: 20 | 90 days supply | Qty: 90 | Fill #1

## 2017-08-31 MED FILL — ZOLPIDEM TARTRATE 5 MG TABL: 5 | 60 days supply | Qty: 60 | Fill #1

## 2017-10-09 MED FILL — LOSARTAN-HCTZ 50-12.5 MG TA: 50-12.5 | 90 days supply | Qty: 90 | Fill #1

## 2017-11-03 MED FILL — ZOLPIDEM TARTRATE 5 MG TABL: 5 | 60 days supply | Qty: 60 | Fill #2

## 2017-11-26 MED FILL — metFORMIN HCL 500 MG TABS: 500 | 90 days supply | Qty: 90 | Fill #2

## 2017-12-07 MED FILL — ATORVASTATIN 40 MG TABLET: 40 | 90 days supply | Qty: 90 | Fill #2

## 2017-12-07 MED FILL — CITALOPRAM HBR 20 MG TABLET: 20 | 90 days supply | Qty: 90 | Fill #2

## 2018-01-02 ENCOUNTER — Telehealth: Payer: Self-pay

## 2018-01-02 ENCOUNTER — Other Ambulatory Visit: Payer: Self-pay | Admitting: Family Medicine

## 2018-01-02 NOTE — Telephone Encounter (Signed)
Verified from Pt pharmacy that Rx for Ambien 5 mg has expired, A new Rx has been signed by Dr Sherren Mocha and has been phoned in to pt pharmacy.

## 2018-01-02 NOTE — Telephone Encounter (Signed)
Spoke with Pharmacy stated that her Rx for Ambien has expired and need a new one to refill pt medication.

## 2018-01-04 ENCOUNTER — Telehealth: Payer: Self-pay

## 2018-01-04 MED FILL — ZOLPIDEM TARTRATE 5 MG TABL: 5 | 60 days supply | Qty: 60 | Fill #0

## 2018-01-04 NOTE — Telephone Encounter (Signed)
Pt Rx for Zolpidem 5 mg was phoned to patients pharmacy [place] patient. #60 with 5 refills

## 2018-01-14 ENCOUNTER — Ambulatory Visit: Payer: Medicare Other | Admitting: Family Medicine

## 2018-01-24 DIAGNOSIS — M25512 Pain in left shoulder: Secondary | ICD-10-CM | POA: Diagnosis not present

## 2018-02-01 ENCOUNTER — Telehealth: Payer: Self-pay

## 2018-02-04 NOTE — Telephone Encounter (Signed)
Pt Rx for Zolpidem 5 mg was phoned to patients pharmacy [place] patient. #60 with 5 refills

## 2018-02-14 MED FILL — metFORMIN HCL 500 MG TABS: 500 | 90 days supply | Qty: 90 | Fill #3

## 2018-02-15 ENCOUNTER — Telehealth: Payer: Self-pay | Admitting: Family Medicine

## 2018-02-15 NOTE — Telephone Encounter (Signed)
Please Advise

## 2018-02-15 NOTE — Telephone Encounter (Signed)
Copied from [place] [phone]. Topic: Appointment Scheduling - Scheduling Inquiry for Clinic >> Feb 15, 2018  2:04 PM Scherrie Gerlach wrote: Reason for CRM: pt states she has a cpe at 1 pm on 5/28 and would like Dr Sherren Mocha to put in lab orders for her to come in prior to her cpe. Pt states she has been seeing dr Sherren Mocha for years and he should know what she needs.

## 2018-02-28 ENCOUNTER — Other Ambulatory Visit: Payer: Self-pay | Admitting: Family Medicine

## 2018-02-28 DIAGNOSIS — E139 Other specified diabetes mellitus without complications: Secondary | ICD-10-CM

## 2018-02-28 DIAGNOSIS — I10 Essential (primary) hypertension: Secondary | ICD-10-CM

## 2018-02-28 DIAGNOSIS — E785 Hyperlipidemia, unspecified: Secondary | ICD-10-CM

## 2018-02-28 NOTE — Telephone Encounter (Signed)
Orders for CPE labs have been placed.

## 2018-03-07 MED FILL — ZOLPIDEM TARTRATE 5 MG TAB: 5 | 60 days supply | Qty: 60 | Fill #1

## 2018-03-07 MED FILL — CITALOPRAM HBR 20 MG TABLET: 20 | 90 days supply | Qty: 90 | Fill #3

## 2018-04-02 ENCOUNTER — Ambulatory Visit: Payer: Medicare Other

## 2018-04-02 ENCOUNTER — Other Ambulatory Visit (INDEPENDENT_AMBULATORY_CARE_PROVIDER_SITE_OTHER): Payer: Medicare Other

## 2018-04-02 ENCOUNTER — Ambulatory Visit (INDEPENDENT_AMBULATORY_CARE_PROVIDER_SITE_OTHER): Payer: Medicare Other | Admitting: Family Medicine

## 2018-04-02 ENCOUNTER — Encounter: Payer: Self-pay | Admitting: Family Medicine

## 2018-04-02 VITALS — BP 110/78 | HR 72 | Temp 98.3°F | Ht 68.0 in | Wt 206.0 lb

## 2018-04-02 DIAGNOSIS — Z0000 Encounter for general adult medical examination without abnormal findings: Secondary | ICD-10-CM | POA: Diagnosis not present

## 2018-04-02 DIAGNOSIS — F5101 Primary insomnia: Secondary | ICD-10-CM

## 2018-04-02 DIAGNOSIS — E139 Other specified diabetes mellitus without complications: Secondary | ICD-10-CM

## 2018-04-02 DIAGNOSIS — F329 Major depressive disorder, single episode, unspecified: Secondary | ICD-10-CM | POA: Diagnosis not present

## 2018-04-02 DIAGNOSIS — I10 Essential (primary) hypertension: Secondary | ICD-10-CM

## 2018-04-02 DIAGNOSIS — E785 Hyperlipidemia, unspecified: Secondary | ICD-10-CM

## 2018-04-02 DIAGNOSIS — F32A Depression, unspecified: Secondary | ICD-10-CM

## 2018-04-02 DIAGNOSIS — J4520 Mild intermittent asthma, uncomplicated: Secondary | ICD-10-CM | POA: Diagnosis not present

## 2018-04-02 LAB — CBC WITH DIFFERENTIAL/PLATELET
BASOS PCT: 0.8 % (ref 0.0–3.0)
Basophils Absolute: 0 10*3/uL (ref 0.0–0.1)
EOS ABS: 0.3 10*3/uL (ref 0.0–0.7)
Eosinophils Relative: 4.4 % (ref 0.0–5.0)
HCT: 46.5 % — ABNORMAL HIGH (ref 36.0–46.0)
Hemoglobin: 15.5 g/dL — ABNORMAL HIGH (ref 12.0–15.0)
LYMPHS ABS: 2.2 10*3/uL (ref 0.7–4.0)
Lymphocytes Relative: 35.4 % (ref 12.0–46.0)
MCHC: 33.2 g/dL (ref 30.0–36.0)
MCV: 90.7 fl (ref 78.0–100.0)
Monocytes Absolute: 0.7 10*3/uL (ref 0.1–1.0)
Monocytes Relative: 10.8 % (ref 3.0–12.0)
NEUTROS ABS: 3 10*3/uL (ref 1.4–7.7)
Neutrophils Relative %: 48.6 % (ref 43.0–77.0)
PLATELETS: 191 10*3/uL (ref 150.0–400.0)
RBC: 5.13 Mil/uL — ABNORMAL HIGH (ref 3.87–5.11)
RDW: 14.3 % (ref 11.5–15.5)
WBC: 6.1 10*3/uL (ref 4.0–10.5)

## 2018-04-02 LAB — HEPATIC FUNCTION PANEL
ALT: 8 U/L (ref 0–35)
AST: 14 U/L (ref 0–37)
Albumin: 4.2 g/dL (ref 3.5–5.2)
Alkaline Phosphatase: 92 U/L (ref 39–117)
BILIRUBIN DIRECT: 0.2 mg/dL (ref 0.0–0.3)
BILIRUBIN TOTAL: 0.9 mg/dL (ref 0.2–1.2)
Total Protein: 6.4 g/dL (ref 6.0–8.3)

## 2018-04-02 LAB — LIPID PANEL
CHOL/HDL RATIO: 3
Cholesterol: 183 mg/dL (ref 0–200)
HDL: 67.3 mg/dL (ref >39.00)
LDL CALC: 92 mg/dL (ref 0–99)
NonHDL: 116.17
TRIGLYCERIDES: 119 mg/dL (ref 0.0–149.0)
VLDL: 23.8 mg/dL (ref 0.0–40.0)

## 2018-04-02 LAB — POCT URINALYSIS DIPSTICK
Bilirubin, UA: NEGATIVE
Glucose, UA: NEGATIVE
KETONES UA: NEGATIVE
LEUKOCYTES UA: NEGATIVE
NITRITE UA: NEGATIVE
PH UA: 6 (ref 5.0–8.0)
Protein, UA: NEGATIVE
RBC UA: NEGATIVE
SPEC GRAV UA: 1.02 (ref 1.010–1.025)
UROBILINOGEN UA: 0.2 U/dL

## 2018-04-02 LAB — BASIC METABOLIC PANEL
BUN: 13 mg/dL (ref 6–23)
CO2: 31 mEq/L (ref 19–32)
Calcium: 10.2 mg/dL (ref 8.4–10.5)
Chloride: 103 mEq/L (ref 96–112)
Creatinine, Ser: 0.74 mg/dL (ref 0.40–1.20)
GFR: 82.03 mL/min (ref >60.00)
Glucose, Bld: 136 mg/dL — ABNORMAL HIGH (ref 70–99)
POTASSIUM: 4.3 meq/L (ref 3.5–5.1)
SODIUM: 142 meq/L (ref 135–145)

## 2018-04-02 LAB — MICROALBUMIN / CREATININE URINE RATIO
Creatinine,U: 82.2 mg/dL
MICROALB UR: 7.5 mg/dL — AB (ref 0.0–1.9)
Microalb Creat Ratio: 9.1 mg/g (ref 0.0–30.0)

## 2018-04-02 LAB — TSH: TSH: 3.65 u[IU]/mL (ref 0.35–4.50)

## 2018-04-02 LAB — HEMOGLOBIN A1C: Hgb A1c MFr Bld: 6.7 % — ABNORMAL HIGH (ref 4.6–6.5)

## 2018-04-02 MED ORDER — MONTELUKAST SODIUM 10 MG PO TABS: 10.0000 mg | ORAL_TABLET | Freq: Every day | ORAL | 4 refills | Status: DC

## 2018-04-02 MED ORDER — METFORMIN HCL 500 MG PO TABS: 500.0000 mg | ORAL_TABLET | Freq: Every day | ORAL | 4 refills | Status: DC

## 2018-04-02 MED ORDER — LOSARTAN POTASSIUM-HCTZ 50-12.5 MG PO TABS: 1.0000 | ORAL_TABLET | Freq: Every day | ORAL | 4 refills | Status: DC

## 2018-04-02 MED ORDER — ZOLPIDEM TARTRATE 5 MG PO TABS: 5.0000 mg | ORAL_TABLET | Freq: Every evening | ORAL | 5 refills | Status: DC | PRN

## 2018-04-02 MED ORDER — ATORVASTATIN CALCIUM 40 MG PO TABS: 40.0000 mg | ORAL_TABLET | Freq: Every day | ORAL | 4 refills | Status: DC

## 2018-04-02 MED FILL — LOSARTAN-HCTZ 50-12.5 MG TA: 50-12.5 | 90 days supply | Qty: 90 | Fill #0

## 2018-04-02 MED FILL — MONTELUKAST SOD 10 MG TAB: 10 | 90 days supply | Qty: 90 | Fill #0

## 2018-04-02 MED FILL — ATORVASTATIN 40 MG TABLET: 40 | 90 days supply | Qty: 90 | Fill #0

## 2018-04-02 NOTE — Progress Notes (Deleted)
Subjective:   EILEEN KANGAS is a 72 y.o. female who presents for Medicare Annual (Subsequent) preventive examination.  Reports health as  Diet Lipids 03/2017 chol/hdl 3 DM A1c 6.8 last year  03/2017 205lb; BMI 30.3    Exercise  Health Maintenance Due  Topic Date Due  . Hepatitis C Screening  Aug 29, 1946  . FOOT EXAM  07/19/2016  . OPHTHALMOLOGY EXAM  03/01/2017  . HEMOGLOBIN A1C  09/28/2017  . MAMMOGRAM  12/22/2017   Discussed shingrix   Hepatitis C  Diabetic eye exam 02/2016 colonoscopy 04/2014 Mammogram 12/2015 Dexa 07/2014 no report (was this completed)            Objective:     Vitals: There were no vitals taken for this visit.  There is no height or weight on file to calculate BMI.  Advanced Directives 06/15/2015 12/08/2014 04/08/2014 04/06/2014 11/30/2011 11/23/2011  Does Patient Have a Medical Advance Directive? No No Patient has advance directive, copy not in chart Patient has advance directive, copy not in chart Patient has advance directive, copy in chart Patient has advance directive, copy not in chart  Type of Advance Directive - - Living will;Healthcare Power of [place];Living will  Would patient like information on creating a medical advance directive? No - patient declined information No - patient declined information - - - -  Pre-existing out of facility DNR order (yellow form or pink MOST form) - - - - No -    Tobacco Social History   Tobacco Use  Smoking Status Former Smoker  . Packs/day: 2.00  . Years: 37.00  . Pack years: 74.00  . Types: Cigarettes  . Last attempt to quit: 09/19/1999  . Years since quitting: 18.5  Smokeless Tobacco Never Used     Counseling given: Not Answered   Clinical Intake:      Past Medical History:  Diagnosis Date  . ASTHMA 03/14/2007   NO PROBELEM IN 12 YRS  . Blood dyscrasia    VON WILLIBRANDE "FREE BLEEDER"  . COLONIC POLYPS, ADENOMATOUS, HX OF  01/11/2009  . DEGENERATION, MACULAR NOS 03/14/2007  . DEPRESSION 03/14/2007  . DIABETES MELLITUS, TYPE II 03/14/2007  . Diarrhea 05/31/2007  . [place] DISEASE, LUMBAR 05/25/2009  . DIVERTICULOSIS OF COLON 01/11/2009  . HYPERLIPIDEMIA 06/21/2010  . HYPERTENSION 03/14/2007  . Lung abnormality    LT LUNG WITH SPOT HAS BEEN FOLLOWED X 3 YRS DR Arlyce Dice  . SEBORRHEIC KERATOSIS, INFLAMED 12/02/2010  . Unspecified hearing loss 01/13/2010  . VERTIGO 11/10/2010   Past Surgical History:  Procedure Laterality Date  . CHOLECYSTECTOMY  1982   urosespsis post op  . JOINT REPLACEMENT  1998   1998 RT KNEE  +2009 REMOVED, REPLACED JOINT  . KNEE ARTHROSCOPY Right 1995   right, TNR  . left shoulder Left 2012  . [place]   fusion, ruptured disk  . TONSILLECTOMY  1952   Family History  Problem Relation Age of Onset  . Heart disease Other   . Diabetes Other        diabeties  . Hyperlipidemia Other   . Hypertension Other   . Cancer Mother   . Diabetes Mother   . Heart disease Mother   . Hyperlipidemia Mother   . Hypertension Mother   . Hyperlipidemia Father   . Hypertension Father   . Colon cancer Maternal Aunt 75  . Colon cancer Maternal Uncle 82  . Colon cancer Maternal Uncle  80   Social History   Socioeconomic History  . Marital status: Single    Spouse name: Not on file  . Number of children: Not on file  . Years of education: Not on file  . Highest education level: Not on file  Occupational History  . Not on file  Social Needs  . Financial resource strain: Not on file  . Food insecurity:    Worry: Not on file    Inability: Not on file  . Transportation needs:    Medical: Not on file    Non-medical: Not on file  Tobacco Use  . Smoking status: Former Smoker    Packs/day: 2.00    Years: 37.00    Pack years: 74.00    Types: Cigarettes    Last attempt to quit: 09/19/1999    Years since quitting: 18.5  . Smokeless tobacco: Never Used  Substance and Sexual Activity  . Alcohol use:  Yes    Comment: rare  . Drug use: No  . Sexual activity: Not on file  Lifestyle  . Physical activity:    Days per week: Not on file    Minutes per session: Not on file  . Stress: Not on file  Relationships  . Social connections:    Talks on phone: Not on file    Gets together: Not on file    Attends religious service: Not on file    Active member of club or organization: Not on file    Attends meetings of clubs or organizations: Not on file    Relationship status: Not on file  Other Topics Concern  . Not on file  Social History Narrative  . Not on file    Outpatient Encounter Medications as of 04/02/2018  Medication Sig  . ACCU-CHEK SMARTVIEW test strip TEST BLOOD SUGAR ONCE DAILY  . acetaminophen (TYLENOL) 325 MG tablet Take 650 mg by mouth every 6 (six) hours as needed for moderate pain.  Marland Kitchen albuterol (PROVENTIL) (2.5 MG/3ML) 0.083% nebulizer solution Take 3 mLs (2.5 mg total) by nebulization every 6 (six) hours as needed for wheezing.  Marland Kitchen aspirin 81 MG tablet Take 81 mg by mouth daily.    Marland Kitchen atorvastatin (LIPITOR) 40 MG tablet Take 1 tablet (40 mg total) by mouth daily.  . Beclomethasone Diprop HFA (QVAR REDIHALER) 40 MCG/ACT AERB Inhale 2 puffs into the lungs 2 (two) times daily.  . blood glucose meter kit and supplies KIT Test once daily for glucose control.  Dx E11.9  . citalopram (CELEXA) 20 MG tablet 1 by mouth each bedtime  . docusate sodium (COLACE) 100 MG capsule Take 100 mg by mouth daily as needed. Reported on 10/21/2015  . fluticasone (FLONASE) 50 MCG/ACT nasal spray 1 spray up each nostril  at bedtime  . losartan-hydrochlorothiazide (HYZAAR) 50-12.5 MG tablet Take 1 tablet by mouth daily.  . metFORMIN (GLUCOPHAGE) 500 MG tablet Take 1 tablet (500 mg total) by mouth daily with breakfast.  . montelukast (SINGULAIR) 10 MG tablet Take 1 tablet (10 mg total) by mouth at bedtime.  . Multiple Vitamins-Minerals (OCUVITE PO) Take 1 tablet by mouth daily.   Marland Kitchen zolpidem (AMBIEN)  5 MG tablet TAKE 1 TABLET BY MOUTH AT BEDTIME AS NEEDED FOR SLEEP   No facility-administered encounter medications on file as of 04/02/2018.     Activities of Daily Living No flowsheet data found.  Patient Care Team: Dorena Cookey, MD as PCP - General    Assessment:   This is a  routine wellness examination for [place].  Exercise Activities and Dietary recommendations    Goals    . Eat more fruits and vegetables    . Prevent Falls       Fall Risk Fall Risk  06/22/2015 04/14/2015 04/06/2014  Falls in the past year? No No No     Depression Screen PHQ 2/9 Scores 10/21/2015 06/22/2015 04/14/2015 04/06/2014  PHQ - 2 Score 0 0 0 0     Cognitive Function   Ad8 score reviewed for issues:  Issues making decisions:  Less interest in hobbies / activities:  Repeats questions, stories (family complaining):  Trouble using ordinary gadgets (microwave, computer, phone):  Forgets the month or year:   Mismanaging finances:   Remembering appts:  Daily problems with thinking and/or memory: Ad8 score is=        Immunization History  Administered Date(s) Administered  . Influenza Split 08/26/2012  . Influenza Whole 11/06/2004, 08/11/2009  . Influenza, High Dose Seasonal PF 07/28/2015, 07/24/2016, 07/27/2017  . Influenza,inj,Quad PF,6+ Mos 07/29/2013, 07/06/2014  . Pneumococcal Conjugate-13 04/06/2014  . Pneumococcal Polysaccharide-23 11/06/2004, 04/02/2013  . Td 11/06/2002, 06/21/2010  . Tdap 06/15/2015  . Zoster 03/18/2008    Screening Tests Health Maintenance  Topic Date Due  . Hepatitis C Screening  August 12, 1946  . FOOT EXAM  07/19/2016  . OPHTHALMOLOGY EXAM  03/01/2017  . HEMOGLOBIN A1C  09/28/2017  . MAMMOGRAM  12/22/2017  . INFLUENZA VACCINE  06/06/2018  . COLONOSCOPY  04/23/2019  . TETANUS/TDAP  06/14/2025  . DEXA SCAN  Completed  . PNA vac Low Risk Adult  Completed         Plan:      PCP Notes ***  Health Maintenance ***  Abnormal Screens    ***  Referrals  ***  Patient concerns; ***  Nurse Concerns; ***  Next PCP apt ***      I have personally reviewed and noted the following in the patient's chart:   . Medical and social history . Use of alcohol, tobacco or illicit drugs  . Current medications and supplements . Functional ability and status . Nutritional status . Physical activity . Advanced directives . List of other physicians . Hospitalizations, surgeries, and ER visits in previous 12 months . Vitals . Screenings to include cognitive, depression, and falls . Referrals and appointments  In addition, I have reviewed and discussed with patient certain preventive protocols, quality metrics, and best practice recommendations. A written personalized care plan for preventive services as well as general preventive health recommendations were provided to patient.     Wynetta Fines, RN  04/02/2018

## 2018-04-02 NOTE — Patient Instructions (Signed)
Labs today......... I will call you if there is anything abnormal  Continue current medications  Get back to the gym...........Marland Kitchen goal is to lose 12 pounds in the next 12 months.  Call your insurance company about the new shingles vaccine  Cold or your mammogram  Return in one year for general physical exam sooner if any problem

## 2018-04-02 NOTE — Progress Notes (Signed)
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 Labs today  She has history of asthma but this past year she did not have to use the albuterol nor the Qvar. She takes Singulair daily and she thinks it's helped decreased her allergic rhinitis and asthma  She takes Celexa 20 mg daily at bedtime for history of chronic depression  She takes Hyzaar 50-2.5 dose one half tab daily for hypertension. BP today 110/78 [name] 70 and regular  She takes metformin 500 mg daily because of history of mild diabetes. Last A1c in May 2018 was 6.8%.  She takes Ambien 5 mg at bedtime for sleep. If she doesn't take it she doesn't sleep. We tried other medications and nothing seems to help. She wishes to continue taking the Ambien.  She gets routine eye care, dental care, last colonoscopy by Dr. Maurene Capes 2015 was normal. She was advised she did not any further colonoscopies.  Vaccinations up-to-date information given on the new shingles vaccine.  14 point review of systems reviewed and otherwise negative  EKG was done because a history of above  EKG was normal and unchanged  Cognitive function normal she walks daily home health safety reviewed no issues identified, no guns in the house, she does have a healthcare power of attorney and living well.  Social history...........Marland Kitchen single retired Therapist, sports. No immediate relatives or friend Lynn [name] is her healthcare power of attorney  BP 110/78 (BP Location: Left Arm, Patient Position: Sitting, Cuff Size: Large)   [name] 72   Temp 98.3 F (36.8 C) (Oral)   Ht 5\' 8"  (1.727 m)   Wt 206 lb (93.4 kg)   BMI 31.32 kg/m  Well-developed well-nourished female no acute distress vital signs stable she's afebrile except her weight. Examination HEENT were negative neck was supple thyroid is nonenlarged no carotid  bruits. Cardiopulmonary exam normal. Abdominal exam normal. Breast exam normal. 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  #1 hypertension at goal..... Continue current meds check labs  #2 hyperlipidemia continue Lipitor and aspirin.....Marland Kitchen check labs  #3 diabetes type 2........ continue metformin encouraged diet exercise and weight loss  Allergic rhinitis continue Singulair  #5..... Sleep dysfunction continue Ambien...Marland KitchenMarland Kitchen

## 2018-04-03 LAB — HEPATITIS C ANTIBODY
Hepatitis C Ab: NONREACTIVE
SIGNAL TO CUT-OFF: 0.03 (ref <1.00)

## 2018-04-05 ENCOUNTER — Telehealth: Payer: Self-pay | Admitting: Family Medicine

## 2018-04-05 NOTE — Telephone Encounter (Signed)
Copied from [place] [id number]. Topic: Quick Communication - See Telephone Encounter >> Apr 05, 2018  1:44 PM Keene Breath wrote: CRM for notification. See Telephone encounter for: 04/05/18. Patient would like a printed copy of her lab results from 04/02/18.  Please call when that is ready for pick up.  Call her at [phone].

## 2018-04-08 NOTE — Telephone Encounter (Signed)
Spoke with pt voiced understanding that her lab results have been printed and are ready for pick up at the office per pt request.

## 2018-04-26 DIAGNOSIS — Z1231 Encounter for screening mammogram for malignant neoplasm of breast: Secondary | ICD-10-CM | POA: Diagnosis not present

## 2018-04-26 DIAGNOSIS — Z803 Family history of malignant neoplasm of breast: Secondary | ICD-10-CM | POA: Diagnosis not present

## 2018-04-26 LAB — HM MAMMOGRAPHY

## 2018-05-03 MED FILL — ZOLPIDEM TARTRATE 5 MG TAB: 5 | 60 days supply | Qty: 60 | Fill #0

## 2018-05-13 ENCOUNTER — Encounter: Payer: Self-pay | Admitting: Family Medicine

## 2018-06-14 MED FILL — metFORMIN HCL 500 MG TABS: 500 | 90 days supply | Qty: 90 | Fill #0

## 2018-07-01 ENCOUNTER — Other Ambulatory Visit: Payer: Self-pay | Admitting: Family Medicine

## 2018-07-01 DIAGNOSIS — F330 Major depressive disorder, recurrent, mild: Secondary | ICD-10-CM

## 2018-07-01 MED FILL — CITALOPRAM HBR 20 MG TABLET: 20 | 90 days supply | Qty: 90 | Fill #0

## 2018-07-01 MED FILL — MONTELUKAST SOD 10 MG TAB: 10 | 90 days supply | Qty: 90 | Fill #1

## 2018-07-01 MED FILL — ATORVASTATIN 40 MG TABLET: 40 | 90 days supply | Qty: 90 | Fill #1

## 2018-07-01 MED FILL — ZOLPIDEM TARTRATE 5 MG TAB: 5 | 60 days supply | Qty: 60 | Fill #1

## 2018-07-26 ENCOUNTER — Ambulatory Visit (INDEPENDENT_AMBULATORY_CARE_PROVIDER_SITE_OTHER): Payer: Medicare Other | Admitting: *Deleted

## 2018-07-26 DIAGNOSIS — Z23 Encounter for immunization: Secondary | ICD-10-CM | POA: Diagnosis not present

## 2018-08-28 MED FILL — ZOLPIDEM TARTRATE 5 MG TAB: 5 | 60 days supply | Qty: 60 | Fill #2

## 2018-09-16 MED FILL — metFORMIN HCL 500 MG TABS: 500 | 90 days supply | Qty: 90 | Fill #1

## 2018-10-04 MED FILL — ATORVASTATIN 40 MG TABLET: 40 | 90 days supply | Qty: 90 | Fill #2

## 2018-10-04 MED FILL — CITALOPRAM HBR 20 MG TABLET: 20 | 90 days supply | Qty: 90 | Fill #1

## 2018-10-28 MED FILL — MONTELUKAST SOD 10 MG TAB: 10 | 90 days supply | Qty: 90 | Fill #2

## 2018-10-31 ENCOUNTER — Other Ambulatory Visit: Payer: Self-pay | Admitting: Family Medicine

## 2018-10-31 NOTE — Telephone Encounter (Signed)
Copied from [place] [id number]. Topic: Quick Communication - Rx Refill/Question >> Oct 31, 2018  3:54 PM [place] D wrote: Medication: zolpidem (AMBIEN) 5 MG tablet   Has the patient contacted their pharmacy? Yes.   (Agent: If no, request that the patient contact the pharmacy for the refill.) (Agent: If yes, when and what did the pharmacy advise?)  Preferred Pharmacy (with phone number or street name): [place], [place] - [place] [phone] (Phone) [phone] (Fax)    Agent: Please be advised that RX refills may take up to 3 business days. We ask that you follow-up with your pharmacy.

## 2018-10-31 NOTE — Telephone Encounter (Signed)
Patient has a TOC appointment tomorrow with Dimas Chyle.

## 2018-10-31 NOTE — Telephone Encounter (Signed)
Requested medication (s) are due for refill today: yes  Requested medication (s) are on the active medication list: yes    Last refill:  04/02/18  #60     5 refills  Future visit scheduled no  Notes to clinic:not delegated  Requested Prescriptions  Pending Prescriptions Disp Refills   zolpidem (AMBIEN) 5 MG tablet 60 tablet 5    Sig: Take 1 tablet (5 mg total) by mouth at bedtime as needed. for sleep     Not Delegated - Psychiatry:  Anxiolytics/Hypnotics Failed - 10/31/2018  3:57 PM      Failed - This refill cannot be delegated      Failed - Urine Drug Screen completed in last 360 days.      Failed - Valid encounter within last 6 months    Recent Outpatient Visits          7 months ago Essential hypertension   Therapist, music at [place], MD   1 year ago Hyperlipidemia, unspecified hyperlipidemia type   Therapist, music at [place], MD   2 years ago Essential hypertension   Therapist, music at [facility] [employer], Jory Ee, MD   2 years ago Asthma, mild persistent, with acute exacerbation   Therapist, music at [place], MD   2 years ago Seasonal allergies   Therapist, music at [facility], [place], DO

## 2018-11-01 ENCOUNTER — Encounter: Payer: Self-pay | Admitting: Family Medicine

## 2018-11-01 ENCOUNTER — Telehealth: Payer: Self-pay | Admitting: Family Medicine

## 2018-11-01 ENCOUNTER — Ambulatory Visit: Payer: Medicare Other | Admitting: Family Medicine

## 2018-11-01 VITALS — BP 132/78 | HR 63 | Temp 97.8°F | Ht 68.0 in | Wt 206.2 lb

## 2018-11-01 DIAGNOSIS — F325 Major depressive disorder, single episode, in full remission: Secondary | ICD-10-CM

## 2018-11-01 DIAGNOSIS — E119 Type 2 diabetes mellitus without complications: Secondary | ICD-10-CM

## 2018-11-01 DIAGNOSIS — F5101 Primary insomnia: Secondary | ICD-10-CM

## 2018-11-01 DIAGNOSIS — E785 Hyperlipidemia, unspecified: Secondary | ICD-10-CM

## 2018-11-01 DIAGNOSIS — E1159 Type 2 diabetes mellitus with other circulatory complications: Secondary | ICD-10-CM

## 2018-11-01 DIAGNOSIS — F330 Major depressive disorder, recurrent, mild: Secondary | ICD-10-CM

## 2018-11-01 DIAGNOSIS — J4520 Mild intermittent asthma, uncomplicated: Secondary | ICD-10-CM

## 2018-11-01 DIAGNOSIS — I152 Hypertension secondary to endocrine disorders: Secondary | ICD-10-CM

## 2018-11-01 DIAGNOSIS — E1169 Type 2 diabetes mellitus with other specified complication: Secondary | ICD-10-CM

## 2018-11-01 DIAGNOSIS — I10 Essential (primary) hypertension: Secondary | ICD-10-CM

## 2018-11-01 MED ORDER — LOSARTAN POTASSIUM-HCTZ 50-12.5 MG PO TABS: 1.0000 | ORAL_TABLET | Freq: Every day | ORAL | 3 refills | Status: DC

## 2018-11-01 MED ORDER — ALBUTEROL SULFATE HFA 108 (90 BASE) MCG/ACT IN AERS: 2.0000 | INHALATION_SPRAY | Freq: Four times a day (QID) | RESPIRATORY_TRACT | 0 refills | Status: DC | PRN

## 2018-11-01 MED ORDER — ATORVASTATIN CALCIUM 40 MG PO TABS: 40.0000 mg | ORAL_TABLET | Freq: Every day | ORAL | 3 refills | Status: DC

## 2018-11-01 MED ORDER — METFORMIN HCL 500 MG PO TABS: 500.0000 mg | ORAL_TABLET | Freq: Every day | ORAL | 3 refills | Status: DC

## 2018-11-01 MED ORDER — ZOLPIDEM TARTRATE 5 MG PO TABS: 5.0000 mg | ORAL_TABLET | Freq: Every evening | ORAL | 5 refills | Status: DC | PRN

## 2018-11-01 MED ORDER — CITALOPRAM HYDROBROMIDE 20 MG PO TABS: ORAL_TABLET | ORAL | 3 refills | Status: DC

## 2018-11-01 MED FILL — LOSARTAN-HCTZ 100-25 MG TAB: 100-25 | 30 days supply | Qty: 15 | Fill #0

## 2018-11-01 MED FILL — PROAIR HFA 90 MCG INHALER: 108 (90 BAS | 25 days supply | Qty: 9 | Fill #0

## 2018-11-01 MED FILL — ZOLPIDEM TARTRATE 5 MG TAB: 5 | 60 days supply | Qty: 60 | Fill #0

## 2018-11-01 NOTE — Telephone Encounter (Signed)
Erroneous. Please disregard.  [name]. Jerline Pain, MD 11/01/2018 9:04 AM

## 2018-11-01 NOTE — Patient Instructions (Signed)
It was very nice to see you today!  I will refill your medications today.  Come back to see me in June for your physical, or sooner as needed.   Take care, Dr Jerline Pain

## 2018-11-01 NOTE — Assessment & Plan Note (Signed)
At goal.  Continue Hyzaar 50-12.5 mg 1 tablet once daily.

## 2018-11-01 NOTE — Assessment & Plan Note (Signed)
Stable.  Normal pulmonary exam today.  Refilled albuterol.  Will take Singulair and Qvar off med list.

## 2018-11-01 NOTE — Progress Notes (Signed)
   Subjective:  Lynn [name] is a 72 y.o. female who presents today with a chief complaint of insomnia and to transfer care to this office.   HPI:  She has several stable, chronic conditions outlined below:  1.  Insomnia.  Takes Ambien 5 mg daily.  This helps with her symptoms.  No side effects.  2.  Type 2 diabetes.  Several year history.  On metformin 500 mg daily.  Tolerating well without side effects.  3.  Dyslipidemia.  On Lipitor 40 mg daily and tolerating well.  4.  Asthma.  Several year history.  Does not currently take any medications.  Has not had a flare in about 3 years.  5.  Hypertension.  On Hyzaar and doing well.  No reported side effects.  6.  Depression.  On Celexa 20 mg daily tolerating well.  Mood is stable.  No side effects.  ROS: Per HPI  PMH: She reports that she quit smoking about 19 years ago. Her smoking use included cigarettes. She has a 74.00 pack-year smoking history. She has never used smokeless tobacco. She reports current alcohol use. She reports that she does not use drugs.  Objective:  Physical Exam: BP 132/78 (BP Location: Left Arm, Patient Position: Sitting, Cuff Size: Normal)   Pulse 63   Temp 97.8 F (36.6 C) (Oral)   Ht 5\' 8"  (1.727 m)   Wt 206 lb 3.2 oz (93.5 kg)   SpO2 99%   BMI 31.35 kg/m   Gen: NAD, resting comfortably CV: RRR with no murmurs appreciated Pulm: NWOB, CTAB with no crackles, wheezes, or rhonchi MSK: No edema, cyanosis, or clubbing noted Skin: Warm, dry Neuro: Grossly normal, moves all extremities Psych: Normal affect and thought content  Assessment/Plan:  T2DM (type 2 diabetes mellitus) (HCC) Last A1c at goal.  Continue metformin 500 mg daily.  Insomnia Stable.  Database reviewed without red flags.  Refilled Ambien 5 mg nightly.  Asthma Stable.  Normal pulmonary exam today.  Refilled albuterol.  Will take Singulair and Qvar off med list.  HTN associated with DM ([place]) At goal.  Continue Hyzaar 50-12.5  mg 1 tablet once daily.  Dyslipidemia associated with T2DM (HCC) Last LDL 92.  Continue Lipitor 40 mg daily.  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  Continue Celexa 20 mg daily.  Preventative Healthcare Patient was instructed to return soon for CPE. Health Maintenance Due  Topic Date Due  . FOOT EXAM  07/19/2016  . OPHTHALMOLOGY EXAM  03/01/2017  . HEMOGLOBIN A1C  10/03/2018    M. Jerline Pain, MD 11/01/2018 12:08 PM

## 2018-11-01 NOTE — Assessment & Plan Note (Signed)
Stable.  Database reviewed without red flags.  Refilled Ambien 5 mg nightly.

## 2018-11-01 NOTE — Assessment & Plan Note (Addendum)
Last A1c at goal.  Continue metformin 500 mg daily.

## 2018-11-01 NOTE — Assessment & Plan Note (Signed)
Last LDL 92.  Continue Lipitor 40 mg daily.

## 2018-11-01 NOTE — Assessment & Plan Note (Signed)
Stable.  Continue Celexa 20 mg daily. 

## 2018-11-07 ENCOUNTER — Telehealth: Payer: Self-pay | Admitting: Family Medicine

## 2018-11-07 ENCOUNTER — Other Ambulatory Visit: Payer: Self-pay

## 2018-11-07 MED ORDER — ACCU-CHEK GUIDE W/DEVICE KIT: 1.0000 | PACK | Freq: Every day | 0 refills | Status: DC

## 2018-11-07 MED FILL — ACCU-CHEK GUIDE W/DEVICE KI: W/DEVICE | 20 days supply | Qty: 1 | Fill #0

## 2018-11-07 NOTE — Telephone Encounter (Signed)
Rx has been sent to pharmacy

## 2018-11-07 NOTE — Telephone Encounter (Signed)
See note  Copied from [place]. Topic: General - Other >> Nov 07, 2018  9:35 AM Lynn [name] wrote: Reason for CRM: Pt states the Accu-Chek blood glucose monitor is broken. Pt states her monitor will not even come on. Pt requests Rx for a new Accu-Chek blood glucose monitor. Cb# [phone]

## 2018-11-14 MED FILL — ACCU-CHEK FASTCLIX LANCETS: 90 days supply | Qty: 102 | Fill #0

## 2018-11-14 MED FILL — ACCU-CHEK GUIDE TEST STRIP: 50 days supply | Qty: 50 | Fill #0

## 2018-12-10 DIAGNOSIS — H35053 Retinal neovascularization, unspecified, bilateral: Secondary | ICD-10-CM | POA: Diagnosis not present

## 2018-12-10 DIAGNOSIS — H2513 Age-related nuclear cataract, bilateral: Secondary | ICD-10-CM | POA: Diagnosis not present

## 2018-12-10 DIAGNOSIS — H04123 Dry eye syndrome of bilateral lacrimal glands: Secondary | ICD-10-CM | POA: Diagnosis not present

## 2018-12-11 ENCOUNTER — Encounter (INDEPENDENT_AMBULATORY_CARE_PROVIDER_SITE_OTHER): Payer: Medicare Other | Admitting: Ophthalmology

## 2018-12-16 ENCOUNTER — Encounter (INDEPENDENT_AMBULATORY_CARE_PROVIDER_SITE_OTHER): Payer: Medicare Other | Admitting: Ophthalmology

## 2018-12-16 DIAGNOSIS — H2513 Age-related nuclear cataract, bilateral: Secondary | ICD-10-CM

## 2018-12-16 DIAGNOSIS — H43813 Vitreous degeneration, bilateral: Secondary | ICD-10-CM

## 2018-12-16 DIAGNOSIS — H353114 Nonexudative age-related macular degeneration, right eye, advanced atrophic with subfoveal involvement: Secondary | ICD-10-CM | POA: Diagnosis not present

## 2018-12-16 DIAGNOSIS — H353221 Exudative age-related macular degeneration, left eye, with active choroidal neovascularization: Secondary | ICD-10-CM

## 2018-12-16 MED FILL — BESIVANCE 0.6% SUSP: 0.6 | 25 days supply | Qty: 5 | Fill #0

## 2018-12-19 ENCOUNTER — Encounter: Payer: Self-pay | Admitting: Physician Assistant

## 2018-12-19 LAB — HM DIABETES EYE EXAM

## 2018-12-31 MED FILL — ZOLPIDEM TARTRATE 5 MG TAB: 5 | 60 days supply | Qty: 60 | Fill #1

## 2019-01-06 ENCOUNTER — Encounter: Payer: Self-pay | Admitting: Family Medicine

## 2019-01-06 ENCOUNTER — Ambulatory Visit (INDEPENDENT_AMBULATORY_CARE_PROVIDER_SITE_OTHER): Payer: Medicare Other | Admitting: Family Medicine

## 2019-01-06 VITALS — BP 110/72 | HR 93 | Temp 97.7°F | Ht 68.0 in | Wt 204.0 lb

## 2019-01-06 DIAGNOSIS — R059 Cough, unspecified: Secondary | ICD-10-CM

## 2019-01-06 DIAGNOSIS — J4520 Mild intermittent asthma, uncomplicated: Secondary | ICD-10-CM | POA: Diagnosis not present

## 2019-01-06 DIAGNOSIS — R05 Cough: Secondary | ICD-10-CM

## 2019-01-06 MED ORDER — PREDNISONE 50 MG PO TABS: ORAL_TABLET | ORAL | 0 refills | Status: DC

## 2019-01-06 MED ORDER — FLUTICASONE PROPIONATE HFA 110 MCG/ACT IN AERO: 1.0000 | INHALATION_SPRAY | Freq: Two times a day (BID) | RESPIRATORY_TRACT | 12 refills | Status: DC

## 2019-01-06 MED ORDER — DOXYCYCLINE HYCLATE 100 MG PO TABS: 100.0000 mg | ORAL_TABLET | Freq: Two times a day (BID) | ORAL | 0 refills | Status: DC

## 2019-01-06 MED FILL — predniSONE 50 MG TABS: 50 | 5 days supply | Qty: 5 | Fill #0

## 2019-01-06 MED FILL — FLOVENT HFA 110 MCG INHALER: 110 | 60 days supply | Qty: 12 | Fill #0

## 2019-01-06 MED FILL — DOXYCYCLINE HYCLATE 100 MG: 100 | 7 days supply | Qty: 14 | Fill #0

## 2019-01-06 NOTE — Patient Instructions (Signed)
It was very nice to see you today!  Please start the prednisone and Flovent.  Start the doxycycline if your symptoms do not improve in the next couple of days.  Stay well-hydrated.  Let me know if your symptoms worsen or not improve the next few days.  Take care, Dr Jerline Pain

## 2019-01-06 NOTE — Progress Notes (Signed)
   Chief Complaint:  Lynn [name] is a 73 y.o. female who presents for same day appointment with a chief complaint of cough.   Assessment/Plan:  Cough / Asthma Exacerbation Given smoking history, will treat as COPD exacerbation.  She is satting 96% on room air and has normal work of breathing-she is appropriate for outpatient treatment.  Start prednisone burst.  Also start Flovent.  Continue albuterol every 4-6 hours over the next couple of days.  Also send in prescription for doxycycline -instructed patient to start symptoms do not improve within the next 1 to 2 days with the above.  Discussed reasons to return to care and seek emergent care.  Follow-up as needed.    Subjective:  HPI:  Cough, acute problem Started 4 days ago. Worsening over that time. Tried using albuterol inhaler with no significant improvement.  She also tried using an old Qvar inhaler seem to make her symptoms worse.  Associated with rhinorrhea, sinus congestion, and malaise.  She has also had some subjective fevers.  Cough is nonproductive.  No sick contacts.  No other obvious alleviating or aggravating factors.    ROS: Per HPI  PMH: She reports that she quit smoking about 19 years ago. Her smoking use included cigarettes. She has a 74.00 pack-year smoking history. She has never used smokeless tobacco. She reports current alcohol use. She reports that she does not use drugs.      Objective:  Physical Exam: BP 110/72 (BP Location: Left Arm, Patient Position: Sitting, Cuff Size: Large)   Pulse 93   Temp 97.7 F (36.5 C) (Oral)   Ht 5\' 8"  (1.727 m)   Wt 204 lb (92.5 kg)   SpO2 96%   BMI 31.02 kg/m   Gen: NAD, resting comfortably HEENT: TMs with clear effusion bilaterally.  OP clear. CV: Regular rate and rhythm with no murmurs appreciated Pulm: Normal work of breathing, scattered wheezes and rhonchi throughout all lung fields.  Good airflow throughout. MSK: No cyanosis.       Lynn [name]. Lynn Pain,  MD 01/06/2019 2:02 PM

## 2019-01-13 ENCOUNTER — Encounter (INDEPENDENT_AMBULATORY_CARE_PROVIDER_SITE_OTHER): Payer: Medicare Other | Admitting: Ophthalmology

## 2019-01-13 DIAGNOSIS — H3553 Other dystrophies primarily involving the sensory retina: Secondary | ICD-10-CM

## 2019-01-13 DIAGNOSIS — H43813 Vitreous degeneration, bilateral: Secondary | ICD-10-CM

## 2019-01-13 DIAGNOSIS — H353221 Exudative age-related macular degeneration, left eye, with active choroidal neovascularization: Secondary | ICD-10-CM

## 2019-01-13 DIAGNOSIS — H353112 Nonexudative age-related macular degeneration, right eye, intermediate dry stage: Secondary | ICD-10-CM

## 2019-01-13 DIAGNOSIS — H2513 Age-related nuclear cataract, bilateral: Secondary | ICD-10-CM | POA: Diagnosis not present

## 2019-01-14 ENCOUNTER — Telehealth: Payer: Self-pay | Admitting: Family Medicine

## 2019-01-14 MED FILL — CITALOPRAM HBR 20 MG TABLET: 20 | 90 days supply | Qty: 90 | Fill #0

## 2019-01-14 MED FILL — metFORMIN HCL 500 MG TABS: 500 | 90 days supply | Qty: 90 | Fill #0

## 2019-01-14 MED FILL — ATORVASTATIN 40 MG TABLET: 40 | 90 days supply | Qty: 90 | Fill #0

## 2019-01-14 NOTE — Telephone Encounter (Signed)
Copied from [place] [phone] [id number]. Topic: Quick Communication - Rx Refill/Question >> Jan 14, 2019  1:12 PM [place], [place] D wrote: Medication: losartan-hydrochlorothiazide (HYZAAR) 50-12.5 MG tablet / Pharmacy stated that medication is on back order. They would like to know if they can split rx and have one rx for losartan and one for [place]. Please reach out to pharmacy.  Has the patient contacted their pharmacy? Yes.   (Agent: If no, request that the patient contact the pharmacy for the refill.) (Agent: If yes, when and what did the pharmacy advise?)  Preferred Pharmacy (with phone number or street name): [place], [place] - [place] [phone] (Phone) [phone] (Fax)  Agent: Please be advised that RX refills may take up to 3 business days. We ask that you follow-up with your pharmacy.

## 2019-01-14 NOTE — Telephone Encounter (Signed)
Pharmacy is requesting a change in medication- please send for PCP review.

## 2019-01-15 ENCOUNTER — Other Ambulatory Visit: Payer: Self-pay

## 2019-01-15 MED ORDER — HYDROCHLOROTHIAZIDE 12.5 MG PO TABS: 12.5000 mg | ORAL_TABLET | Freq: Every day | ORAL | 1 refills | Status: DC

## 2019-01-15 MED ORDER — LOSARTAN POTASSIUM 50 MG PO TABS: 50.0000 mg | ORAL_TABLET | Freq: Every day | ORAL | 1 refills | Status: DC

## 2019-01-15 MED FILL — HYDROCHLOROTHIAZIDE 12.5 MG: 12.5 | 90 days supply | Qty: 90 | Fill #0

## 2019-01-15 MED FILL — LOSARTAN POTASSIUM 50 MG TA: 50 | 30 days supply | Qty: 30 | Fill #0

## 2019-01-15 NOTE — Telephone Encounter (Signed)
Ok with me. Please place any necessary orders. 

## 2019-01-15 NOTE — Telephone Encounter (Signed)
Please advise. Ok to split Rx?

## 2019-01-15 NOTE — Telephone Encounter (Signed)
Rx sent to pharmacy   

## 2019-02-17 ENCOUNTER — Other Ambulatory Visit: Payer: Self-pay

## 2019-02-17 ENCOUNTER — Encounter (INDEPENDENT_AMBULATORY_CARE_PROVIDER_SITE_OTHER): Payer: Medicare Other | Admitting: Ophthalmology

## 2019-02-17 DIAGNOSIS — H3553 Other dystrophies primarily involving the sensory retina: Secondary | ICD-10-CM

## 2019-02-17 DIAGNOSIS — H353112 Nonexudative age-related macular degeneration, right eye, intermediate dry stage: Secondary | ICD-10-CM | POA: Diagnosis not present

## 2019-02-17 DIAGNOSIS — H353221 Exudative age-related macular degeneration, left eye, with active choroidal neovascularization: Secondary | ICD-10-CM

## 2019-02-17 DIAGNOSIS — H2513 Age-related nuclear cataract, bilateral: Secondary | ICD-10-CM | POA: Diagnosis not present

## 2019-02-17 DIAGNOSIS — H43813 Vitreous degeneration, bilateral: Secondary | ICD-10-CM

## 2019-02-18 MED FILL — BESIVANCE 0.6% SUSP: 0.6 | 25 days supply | Qty: 5 | Fill #1

## 2019-02-26 MED FILL — LOSARTAN POTASSIUM 50 MG TA: 50 | 30 days supply | Qty: 30 | Fill #1

## 2019-02-26 MED FILL — ZOLPIDEM TARTRATE 5 MG TAB: 5 | 60 days supply | Qty: 60 | Fill #2

## 2019-03-17 ENCOUNTER — Encounter (INDEPENDENT_AMBULATORY_CARE_PROVIDER_SITE_OTHER): Payer: Medicare Other | Admitting: Ophthalmology

## 2019-03-17 ENCOUNTER — Other Ambulatory Visit: Payer: Self-pay

## 2019-03-17 DIAGNOSIS — H353221 Exudative age-related macular degeneration, left eye, with active choroidal neovascularization: Secondary | ICD-10-CM

## 2019-03-17 DIAGNOSIS — H43813 Vitreous degeneration, bilateral: Secondary | ICD-10-CM

## 2019-03-17 DIAGNOSIS — H3553 Other dystrophies primarily involving the sensory retina: Secondary | ICD-10-CM | POA: Diagnosis not present

## 2019-03-17 DIAGNOSIS — H353112 Nonexudative age-related macular degeneration, right eye, intermediate dry stage: Secondary | ICD-10-CM

## 2019-04-14 ENCOUNTER — Encounter (INDEPENDENT_AMBULATORY_CARE_PROVIDER_SITE_OTHER): Payer: Medicare Other | Admitting: Ophthalmology

## 2019-04-14 ENCOUNTER — Other Ambulatory Visit: Payer: Self-pay

## 2019-04-14 DIAGNOSIS — H43813 Vitreous degeneration, bilateral: Secondary | ICD-10-CM | POA: Diagnosis not present

## 2019-04-14 DIAGNOSIS — H353112 Nonexudative age-related macular degeneration, right eye, intermediate dry stage: Secondary | ICD-10-CM

## 2019-04-14 DIAGNOSIS — H353221 Exudative age-related macular degeneration, left eye, with active choroidal neovascularization: Secondary | ICD-10-CM

## 2019-04-15 ENCOUNTER — Other Ambulatory Visit: Payer: Self-pay

## 2019-04-15 MED FILL — CITALOPRAM HBR 20 MG TABLET: 20 | 90 days supply | Qty: 90 | Fill #1

## 2019-04-15 NOTE — Patient Outreach (Signed)
[facility]) Care Management  04/15/2019  Lynn [name] [facility] 12-Mar-1946 [id number]   Medication Adherence call to Lynn [name] [employer] Voice message left with a call back number. Lynn [name] is showing past due on Metformin 500 mg and Atorvastatin 40 mg under [place].   [place] Management Direct Dial [phone]  Fax [phone] [email or web address]

## 2019-04-16 MED FILL — BESIVANCE 0.6% SUSP: 0.6 | 25 days supply | Qty: 5 | Fill #2

## 2019-04-17 MED FILL — ZOLPIDEM TARTRATE 5 MG TAB: 5 | 60 days supply | Qty: 60 | Fill #3

## 2019-04-25 ENCOUNTER — Encounter: Payer: Self-pay | Admitting: Gastroenterology

## 2019-04-25 MED FILL — LOSARTAN-HCTZ 100-25 MG TAB: 100-25 | 30 days supply | Qty: 15 | Fill #1

## 2019-05-02 ENCOUNTER — Other Ambulatory Visit: Payer: Self-pay | Admitting: Family Medicine

## 2019-05-02 MED FILL — ALBUTEROL SULFATE HFA 108 (: 108 (90 BAS | 25 days supply | Qty: 9 | Fill #0

## 2019-05-02 MED FILL — metFORMIN HCL 500 MG TABS: 500 | 90 days supply | Qty: 90 | Fill #1

## 2019-05-02 MED FILL — ATORVASTATIN 40 MG TABLET: 40 | 90 days supply | Qty: 90 | Fill #1

## 2019-05-02 MED FILL — ACCU-CHEK GUIDE TEST STRIP: 50 days supply | Qty: 50 | Fill #1

## 2019-05-02 MED FILL — FLOVENT HFA 110 MCG INHALER: 110 | 60 days supply | Qty: 12 | Fill #1

## 2019-05-02 MED FILL — ACCU-CHEK FASTCLIX LANCETS: 90 days supply | Qty: 102 | Fill #0

## 2019-05-02 NOTE — Telephone Encounter (Signed)
Rx Request 

## 2019-05-16 ENCOUNTER — Other Ambulatory Visit: Payer: Self-pay

## 2019-05-16 ENCOUNTER — Encounter (INDEPENDENT_AMBULATORY_CARE_PROVIDER_SITE_OTHER): Payer: Medicare Other | Admitting: Ophthalmology

## 2019-05-16 DIAGNOSIS — H353114 Nonexudative age-related macular degeneration, right eye, advanced atrophic with subfoveal involvement: Secondary | ICD-10-CM | POA: Diagnosis not present

## 2019-05-16 DIAGNOSIS — H2513 Age-related nuclear cataract, bilateral: Secondary | ICD-10-CM | POA: Diagnosis not present

## 2019-05-16 DIAGNOSIS — H353221 Exudative age-related macular degeneration, left eye, with active choroidal neovascularization: Secondary | ICD-10-CM | POA: Diagnosis not present

## 2019-05-16 DIAGNOSIS — H3553 Other dystrophies primarily involving the sensory retina: Secondary | ICD-10-CM

## 2019-05-16 DIAGNOSIS — H43813 Vitreous degeneration, bilateral: Secondary | ICD-10-CM

## 2019-05-30 ENCOUNTER — Other Ambulatory Visit: Payer: Self-pay

## 2019-05-30 NOTE — Patient Outreach (Signed)
[facility]) Care Management  05/30/2019  Lynn [name] [facility] Mar 02, 1946 [id number]   Medication Adherence call to Lynn [name] Compliant Voice message left with a call back number. Lynn [name] is showing past due on Losartan /Hctz under [place].   [place] Management Direct Dial [phone]  Fax [phone] [name]

## 2019-06-10 MED FILL — LOSARTAN-HCTZ 100-25 MG TAB: 100-25 | 30 days supply | Qty: 15 | Fill #2

## 2019-06-13 MED FILL — ZOLPIDEM TARTRATE 5 MG TAB: 5 | 60 days supply | Qty: 60 | Fill #0

## 2019-06-23 ENCOUNTER — Other Ambulatory Visit: Payer: Self-pay

## 2019-06-23 ENCOUNTER — Encounter (INDEPENDENT_AMBULATORY_CARE_PROVIDER_SITE_OTHER): Payer: Medicare Other | Admitting: Ophthalmology

## 2019-06-23 DIAGNOSIS — H43813 Vitreous degeneration, bilateral: Secondary | ICD-10-CM | POA: Diagnosis not present

## 2019-06-23 DIAGNOSIS — H353114 Nonexudative age-related macular degeneration, right eye, advanced atrophic with subfoveal involvement: Secondary | ICD-10-CM

## 2019-06-23 DIAGNOSIS — H353221 Exudative age-related macular degeneration, left eye, with active choroidal neovascularization: Secondary | ICD-10-CM | POA: Diagnosis not present

## 2019-07-03 ENCOUNTER — Ambulatory Visit: Payer: Medicare Other

## 2019-07-16 ENCOUNTER — Other Ambulatory Visit: Payer: Self-pay

## 2019-07-16 NOTE — Patient Outreach (Signed)
[place] [facility]) Care Management  07/16/2019  Lynn [name] July 17, 1946 [id number]   Medication Adherence call to Mrs. [place] Compliant Voice message left with a call back number. Patient is past due on Lisinopril/Hctz 100/25 mg under [place].   [place] Management Direct Dial [phone]  Fax [phone] [name]

## 2019-07-28 ENCOUNTER — Other Ambulatory Visit: Payer: Self-pay

## 2019-07-28 ENCOUNTER — Encounter (INDEPENDENT_AMBULATORY_CARE_PROVIDER_SITE_OTHER): Payer: Medicare Other | Admitting: Ophthalmology

## 2019-07-28 DIAGNOSIS — H353221 Exudative age-related macular degeneration, left eye, with active choroidal neovascularization: Secondary | ICD-10-CM

## 2019-07-28 DIAGNOSIS — H353114 Nonexudative age-related macular degeneration, right eye, advanced atrophic with subfoveal involvement: Secondary | ICD-10-CM

## 2019-07-28 DIAGNOSIS — H43813 Vitreous degeneration, bilateral: Secondary | ICD-10-CM

## 2019-08-08 ENCOUNTER — Other Ambulatory Visit: Payer: Self-pay

## 2019-08-08 ENCOUNTER — Ambulatory Visit: Payer: Medicare Other | Admitting: Family Medicine

## 2019-08-08 NOTE — Patient Outreach (Signed)
[facility]) Care Management  08/08/2019  Lynn [name] [facility] [birth year] [id number]   Medication Adherence call to Mrs. Claudean [place] Telephone call to Patient regarding Medication Adherence unable to reach patient. Mrs. Garron is showing past due on Atorvastatin 40 mg and Metformin 500 mg under [place].   [place] Management Direct Dial [phone]  Fax [phone] [email or web address]

## 2019-08-11 MED FILL — ZOLPIDEM TARTRATE 5 MG TAB: 5 | 60 days supply | Qty: 60 | Fill #1

## 2019-08-12 MED FILL — ATORVASTATIN 40 MG TABLET: 40 | 90 days supply | Qty: 90 | Fill #2

## 2019-08-12 MED FILL — metFORMIN HCL 500 MG TABS: 500 | 90 days supply | Qty: 90 | Fill #2

## 2019-08-12 MED FILL — LOSARTAN-HCTZ 100-25 MG TAB: 100-25 | 30 days supply | Qty: 15 | Fill #3

## 2019-08-14 MED FILL — CITALOPRAM HBR 20 MG TABLET: 20 | 90 days supply | Qty: 90 | Fill #2

## 2019-09-08 ENCOUNTER — Encounter (INDEPENDENT_AMBULATORY_CARE_PROVIDER_SITE_OTHER): Payer: Medicare Other | Admitting: Ophthalmology

## 2019-09-08 DIAGNOSIS — H353114 Nonexudative age-related macular degeneration, right eye, advanced atrophic with subfoveal involvement: Secondary | ICD-10-CM

## 2019-09-08 DIAGNOSIS — H43813 Vitreous degeneration, bilateral: Secondary | ICD-10-CM | POA: Diagnosis not present

## 2019-09-08 DIAGNOSIS — H3553 Other dystrophies primarily involving the sensory retina: Secondary | ICD-10-CM

## 2019-09-08 DIAGNOSIS — H353221 Exudative age-related macular degeneration, left eye, with active choroidal neovascularization: Secondary | ICD-10-CM | POA: Diagnosis not present

## 2019-10-08 MED FILL — ZOLPIDEM TARTRATE 5 MG TAB: 5 | 60 days supply | Qty: 60 | Fill #2

## 2019-10-14 MED FILL — LOSARTAN-HCTZ 100-25 MG TAB: 100-25 | 30 days supply | Qty: 15 | Fill #4

## 2019-10-20 ENCOUNTER — Other Ambulatory Visit: Payer: Self-pay

## 2019-10-20 ENCOUNTER — Encounter (INDEPENDENT_AMBULATORY_CARE_PROVIDER_SITE_OTHER): Payer: Medicare Other | Admitting: Ophthalmology

## 2019-10-20 DIAGNOSIS — H3553 Other dystrophies primarily involving the sensory retina: Secondary | ICD-10-CM

## 2019-10-20 DIAGNOSIS — H353221 Exudative age-related macular degeneration, left eye, with active choroidal neovascularization: Secondary | ICD-10-CM

## 2019-10-20 DIAGNOSIS — H43813 Vitreous degeneration, bilateral: Secondary | ICD-10-CM | POA: Diagnosis not present

## 2019-10-20 DIAGNOSIS — H353114 Nonexudative age-related macular degeneration, right eye, advanced atrophic with subfoveal involvement: Secondary | ICD-10-CM | POA: Diagnosis not present

## 2019-10-20 MED FILL — BESIVANCE 0.6% SUSP: 0.6 | 25 days supply | Qty: 5 | Fill #3

## 2019-11-18 ENCOUNTER — Telehealth: Payer: Self-pay | Admitting: Family Medicine

## 2019-11-18 ENCOUNTER — Other Ambulatory Visit: Payer: Self-pay

## 2019-11-18 MED ORDER — LOSARTAN POTASSIUM-HCTZ 50-12.5 MG PO TABS: 1.0000 | ORAL_TABLET | Freq: Every day | ORAL | 3 refills | Status: DC

## 2019-11-18 MED ORDER — ATORVASTATIN CALCIUM 40 MG PO TABS: 40.0000 mg | ORAL_TABLET | Freq: Every day | ORAL | 3 refills | Status: DC

## 2019-11-18 MED ORDER — METFORMIN HCL 500 MG PO TABS: 500.0000 mg | ORAL_TABLET | Freq: Every day | ORAL | 3 refills | Status: DC

## 2019-11-18 MED FILL — LOSARTAN-HCTZ 50-12.5 MG TA: 50-12.5 | 30 days supply | Qty: 30 | Fill #0

## 2019-11-18 MED FILL — metFORMIN HCL 500 MG TABS: 500 | 90 days supply | Qty: 90 | Fill #0

## 2019-11-18 MED FILL — ATORVASTATIN 40 MG TABLET: 40 | 90 days supply | Qty: 90 | Fill #0

## 2019-11-18 NOTE — Telephone Encounter (Signed)
Rx sent 

## 2019-11-18 NOTE — Telephone Encounter (Signed)
Pt called requesting for medications to be refilled. Please advise.

## 2019-12-01 ENCOUNTER — Other Ambulatory Visit: Payer: Self-pay

## 2019-12-01 ENCOUNTER — Encounter (INDEPENDENT_AMBULATORY_CARE_PROVIDER_SITE_OTHER): Payer: Medicare Other | Admitting: Ophthalmology

## 2019-12-01 DIAGNOSIS — H353112 Nonexudative age-related macular degeneration, right eye, intermediate dry stage: Secondary | ICD-10-CM | POA: Diagnosis not present

## 2019-12-01 DIAGNOSIS — H43813 Vitreous degeneration, bilateral: Secondary | ICD-10-CM | POA: Diagnosis not present

## 2019-12-01 DIAGNOSIS — H353221 Exudative age-related macular degeneration, left eye, with active choroidal neovascularization: Secondary | ICD-10-CM

## 2019-12-03 MED FILL — metFORMIN HCL 500 MG TABS: 500 | 90 days supply | Qty: 90 | Fill #0

## 2019-12-03 MED FILL — ATORVASTATIN 40 MG TABLET: 40 | 90 days supply | Qty: 90 | Fill #0

## 2019-12-03 MED FILL — LOSARTAN-HCTZ 50-12.5 MG TA: 50-12.5 | 30 days supply | Qty: 30 | Fill #0

## 2019-12-04 ENCOUNTER — Telehealth: Payer: Self-pay | Admitting: Family Medicine

## 2019-12-04 NOTE — Telephone Encounter (Signed)
Pt called and was very upset. Pt stated her Ambien was not refilled and she wanted to speak with Dr. Jerline Pain as soon as possible. Pt stated she does not want to speak with nurse. Please advise.

## 2019-12-04 NOTE — Telephone Encounter (Signed)
Last OV 01/06/19 Last refill 05/02/19 #60/5 Next OV not scheduled

## 2019-12-04 NOTE — Telephone Encounter (Signed)
First time I have received rx request. Will send in. Please ask patient to schedule OV soon as we have not seen her for almost a year.  [name]. Jerline Pain, MD 12/04/2019 1:02 PM

## 2019-12-04 NOTE — Telephone Encounter (Signed)
Pt stated she will make an appointment to see Dr. Jerline Pain as soon as she can get a COVID vaccine. She does not want to leave the house until then and does not have availability to do virtual. Pt wanted to thank Dr. Jerline Pain for refilling her prescription.

## 2019-12-05 ENCOUNTER — Ambulatory Visit: Payer: Medicare Other

## 2019-12-06 MED FILL — ZOLPIDEM TARTRATE 5 MG TAB: 5 | 30 days supply | Qty: 30 | Fill #0

## 2019-12-09 ENCOUNTER — Other Ambulatory Visit: Payer: Self-pay | Admitting: Family Medicine

## 2019-12-09 ENCOUNTER — Other Ambulatory Visit: Payer: Self-pay

## 2019-12-09 ENCOUNTER — Telehealth: Payer: Self-pay

## 2019-12-09 DIAGNOSIS — F330 Major depressive disorder, recurrent, mild: Secondary | ICD-10-CM

## 2019-12-09 MED ORDER — CITALOPRAM HYDROBROMIDE 20 MG PO TABS: ORAL_TABLET | ORAL | 3 refills | Status: DC

## 2019-12-09 MED FILL — CITALOPRAM HBR 20 MG TABLET: 20 | 90 days supply | Qty: 90 | Fill #0

## 2019-12-09 NOTE — Telephone Encounter (Signed)
Patient is calling requesting to speak to Dr.Parker patient states that the conversation should take all of 5 mins.. patient do not want to speak to the nurse. Patient states that she is having a hard time with her prescriptions and she didn't sign up for this.

## 2019-12-09 NOTE — Telephone Encounter (Signed)
Rx sent- patient aware.  

## 2019-12-11 ENCOUNTER — Ambulatory Visit: Payer: Medicare Other | Attending: Internal Medicine

## 2019-12-11 DIAGNOSIS — Z23 Encounter for immunization: Secondary | ICD-10-CM

## 2019-12-11 NOTE — Progress Notes (Signed)
   Covid-19 Vaccination Clinic  Name:  [name]    MRN: [id number] DOB: 06-25-1946  12/11/2019  [name] was observed post Covid-19 immunization for 15 minutes without incidence. She was provided with Vaccine Information Sheet and instruction to access the V-Safe system.   [name] was instructed to call 911 with any severe reactions post vaccine: Marland Kitchen Difficulty breathing  . Swelling of your face and throat  . A fast heartbeat  . A bad rash all over your body  . Dizziness and weakness    Immunizations Administered    Name Date Dose VIS Date Route   Pfizer COVID-19 Vaccine 12/11/2019  2:55 PM 0.3 mL 10/17/2019 Intramuscular   Manufacturer: [place]   Lot: [id number] [id number]   [place]: [id number]

## 2019-12-22 ENCOUNTER — Ambulatory Visit: Payer: Medicare Other

## 2020-01-05 ENCOUNTER — Ambulatory Visit: Payer: Medicare Other | Attending: Internal Medicine

## 2020-01-05 DIAGNOSIS — Z23 Encounter for immunization: Secondary | ICD-10-CM | POA: Insufficient documentation

## 2020-01-05 NOTE — Progress Notes (Signed)
   Covid-19 Vaccination Clinic  Name:  CLARESE AKIN    MRN: [id number] DOB: 03-28-1946  01/05/2020  Ms. Batta was observed post Covid-19 immunization for 15 minutes without incidence. She was provided with Vaccine Information Sheet and instruction to access the V-Safe system.   Ms. Vadnais was instructed to call 911 with any severe reactions post vaccine: Marland Kitchen Difficulty breathing  . Swelling of your face and throat  . A fast heartbeat  . A bad rash all over your body  . Dizziness and weakness    Immunizations Administered    Name Date Dose VIS Date Route   Pfizer COVID-19 Vaccine 01/05/2020  5:20 PM 0.3 mL 10/17/2019 Intramuscular   Manufacturer: [place]   Lot: [id number]   [place]: [id number]

## 2020-01-08 ENCOUNTER — Telehealth: Payer: Self-pay | Admitting: Family Medicine

## 2020-01-08 NOTE — Telephone Encounter (Signed)
I called the patient to schedule AWV with Loma Sousa ([place]), but there was no answer and no option to leave a message. If patient calls back, please schedule Medicare Wellness Visit at next available opening. VDM ([place])

## 2020-01-09 ENCOUNTER — Other Ambulatory Visit: Payer: Self-pay

## 2020-01-13 ENCOUNTER — Other Ambulatory Visit: Payer: Self-pay

## 2020-01-13 ENCOUNTER — Ambulatory Visit (INDEPENDENT_AMBULATORY_CARE_PROVIDER_SITE_OTHER): Payer: Medicare Other | Admitting: Family Medicine

## 2020-01-13 ENCOUNTER — Encounter: Payer: Self-pay | Admitting: Family Medicine

## 2020-01-13 VITALS — BP 90/62 | HR 94 | Temp 97.2°F | Ht 67.75 in | Wt 190.2 lb

## 2020-01-13 DIAGNOSIS — E785 Hyperlipidemia, unspecified: Secondary | ICD-10-CM | POA: Diagnosis not present

## 2020-01-13 DIAGNOSIS — E1159 Type 2 diabetes mellitus with other circulatory complications: Secondary | ICD-10-CM

## 2020-01-13 DIAGNOSIS — I152 Hypertension secondary to endocrine disorders: Secondary | ICD-10-CM

## 2020-01-13 DIAGNOSIS — I10 Essential (primary) hypertension: Secondary | ICD-10-CM

## 2020-01-13 DIAGNOSIS — F325 Major depressive disorder, single episode, in full remission: Secondary | ICD-10-CM | POA: Diagnosis not present

## 2020-01-13 DIAGNOSIS — F5101 Primary insomnia: Secondary | ICD-10-CM

## 2020-01-13 DIAGNOSIS — E119 Type 2 diabetes mellitus without complications: Secondary | ICD-10-CM

## 2020-01-13 DIAGNOSIS — E1169 Type 2 diabetes mellitus with other specified complication: Secondary | ICD-10-CM | POA: Diagnosis not present

## 2020-01-13 MED ORDER — ZOLPIDEM TARTRATE 5 MG PO TABS: 5.0000 mg | ORAL_TABLET | Freq: Every evening | ORAL | 5 refills | Status: DC | PRN

## 2020-01-13 MED FILL — ZOLPIDEM TARTRATE 5 MG TAB: 5 | 30 days supply | Qty: 30 | Fill #0

## 2020-01-13 NOTE — Assessment & Plan Note (Signed)
Stable.  Database no red flags.  Refill Ambien 5 mg nightly as needed.

## 2020-01-13 NOTE — Progress Notes (Signed)
   Lynn [name] is a 74 y.o. female who presents today for an office visit.  Assessment/Plan:  Chronic Problems Addressed Today: HTN associated with DM ([place]) At goal.  Continue Hyzaar 50-12.5 once daily.  Check CBC, C met, TSH.  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  Continue Celexa 20 mg daily.  Dyslipidemia associated with T2DM (HCC) Stable.  Continue atorvastatin 40 mg daily.  Check lipid panel today.  T2DM (type 2 diabetes mellitus) (HCC) Check A1c.  Continue Metformin 500 mg daily.  Insomnia Stable.  Database no red flags.  Refill Ambien 5 mg nightly as needed.     Subjective:  HPI:  See A/p.         Objective:  Physical Exam: BP 90/62   Pulse 94   Temp (!) 97.2 F (36.2 C)   Ht 5' 7.75" (1.721 m)   Wt 190 lb 4 oz (86.3 kg)   SpO2 98%   BMI 29.14 kg/m   Gen: No acute distress, resting comfortably CV: Regular rate and rhythm with no murmurs appreciated Pulm: Normal work of breathing, clear to auscultation bilaterally with no crackles, wheezes, or rhonchi Neuro: Grossly normal, moves all extremities Psych: Normal affect and thought content      Kutter Schnepf M. Jerline Pain, MD 01/13/2020 3:32 PM

## 2020-01-13 NOTE — Assessment & Plan Note (Signed)
Stable.  Continue Celexa 20 mg daily. 

## 2020-01-13 NOTE — Assessment & Plan Note (Signed)
Stable.  Continue atorvastatin 40 mg daily.  Check lipid panel today.

## 2020-01-13 NOTE — Assessment & Plan Note (Signed)
Check A1c.  Continue Metformin 500 mg daily. 

## 2020-01-13 NOTE — Assessment & Plan Note (Signed)
At goal.  Continue Hyzaar 50-12.5 once daily.  Check CBC, C met, TSH. 

## 2020-01-13 NOTE — Patient Instructions (Signed)
It was very nice to see you today!  No changes today. I will refill your medications. We'll check blood work today.  Take care, Dr Jerline Pain  Please try these tips to maintain a healthy lifestyle:   Eat at least 3 REAL meals and 1-2 snacks per day.  Aim for no more than 5 hours between eating.  If you eat breakfast, please do so within one hour of getting up.    Each meal should contain half fruits/vegetables, one quarter protein, and one quarter carbs (no bigger than a computer mouse)   Cut down on sweet beverages. This includes juice, soda, and sweet tea.     Drink at least 1 glass of water with each meal and aim for at least 8 glasses per day   Exercise at least 150 minutes every week.

## 2020-01-14 LAB — COMPREHENSIVE METABOLIC PANEL
ALT: 10 U/L (ref 0–35)
AST: 15 U/L (ref 0–37)
Albumin: 4.1 g/dL (ref 3.5–5.2)
Alkaline Phosphatase: 97 U/L (ref 39–117)
BUN: 19 mg/dL (ref 6–23)
CO2: 33 mEq/L — ABNORMAL HIGH (ref 19–32)
Calcium: 10.8 mg/dL — ABNORMAL HIGH (ref 8.4–10.5)
Chloride: 99 mEq/L (ref 96–112)
Creatinine, Ser: 0.88 mg/dL (ref 0.40–1.20)
GFR: 62.88 mL/min (ref >60.00)
Glucose, Bld: 172 mg/dL — ABNORMAL HIGH (ref 70–99)
Potassium: 4.7 mEq/L (ref 3.5–5.1)
Sodium: 138 mEq/L (ref 135–145)
Total Bilirubin: 0.5 mg/dL (ref 0.2–1.2)
Total Protein: 6.3 g/dL (ref 6.0–8.3)

## 2020-01-14 LAB — LIPID PANEL
Cholesterol: 171 mg/dL (ref 0–200)
HDL: 58.8 mg/dL (ref >39.00)
NonHDL: 111.99
Total CHOL/HDL Ratio: 3
Triglycerides: 208 mg/dL — ABNORMAL HIGH (ref 0.0–149.0)
VLDL: 41.6 mg/dL — ABNORMAL HIGH (ref 0.0–40.0)

## 2020-01-14 LAB — CBC
HCT: 46.2 % — ABNORMAL HIGH (ref 36.0–46.0)
Hemoglobin: 15.5 g/dL — ABNORMAL HIGH (ref 12.0–15.0)
MCHC: 33.5 g/dL (ref 30.0–36.0)
MCV: 92.3 fl (ref 78.0–100.0)
Platelets: 173 10*3/uL (ref 150.0–400.0)
RBC: 5 Mil/uL (ref 3.87–5.11)
RDW: 13.9 % (ref 11.5–15.5)
WBC: 8.2 10*3/uL (ref 4.0–10.5)

## 2020-01-14 LAB — LDL CHOLESTEROL, DIRECT: Direct LDL: 73 mg/dL

## 2020-01-14 LAB — HEMOGLOBIN A1C: Hgb A1c MFr Bld: 6.3 % (ref 4.6–6.5)

## 2020-01-14 LAB — TSH: TSH: 2.26 u[IU]/mL (ref 0.35–4.50)

## 2020-01-19 ENCOUNTER — Other Ambulatory Visit: Payer: Self-pay

## 2020-01-19 ENCOUNTER — Encounter (INDEPENDENT_AMBULATORY_CARE_PROVIDER_SITE_OTHER): Payer: Medicare Other | Admitting: Ophthalmology

## 2020-01-19 DIAGNOSIS — H353112 Nonexudative age-related macular degeneration, right eye, intermediate dry stage: Secondary | ICD-10-CM

## 2020-01-19 DIAGNOSIS — H3553 Other dystrophies primarily involving the sensory retina: Secondary | ICD-10-CM

## 2020-01-19 DIAGNOSIS — H43813 Vitreous degeneration, bilateral: Secondary | ICD-10-CM | POA: Diagnosis not present

## 2020-01-19 DIAGNOSIS — H353221 Exudative age-related macular degeneration, left eye, with active choroidal neovascularization: Secondary | ICD-10-CM

## 2020-01-19 NOTE — Progress Notes (Signed)
Please inform patient of the following:  BLood work is all stable. Do not need to make any changes to her treatment plan at this time. Recommend she come back in 6 months to recheck blood sugar.  [name]. Jerline Pain, MD 01/19/2020 1:04 PM

## 2020-01-22 NOTE — Progress Notes (Signed)
Send results via my chart

## 2020-02-10 MED FILL — ZOLPIDEM TARTRATE 5 MG TAB: 5 | 30 days supply | Qty: 30 | Fill #1

## 2020-03-08 ENCOUNTER — Encounter (INDEPENDENT_AMBULATORY_CARE_PROVIDER_SITE_OTHER): Payer: Medicare Other | Admitting: Ophthalmology

## 2020-03-08 ENCOUNTER — Other Ambulatory Visit: Payer: Self-pay

## 2020-03-08 DIAGNOSIS — H3553 Other dystrophies primarily involving the sensory retina: Secondary | ICD-10-CM | POA: Diagnosis not present

## 2020-03-08 DIAGNOSIS — H353221 Exudative age-related macular degeneration, left eye, with active choroidal neovascularization: Secondary | ICD-10-CM | POA: Diagnosis not present

## 2020-03-08 DIAGNOSIS — H353112 Nonexudative age-related macular degeneration, right eye, intermediate dry stage: Secondary | ICD-10-CM | POA: Diagnosis not present

## 2020-03-08 DIAGNOSIS — H43813 Vitreous degeneration, bilateral: Secondary | ICD-10-CM | POA: Diagnosis not present

## 2020-03-13 MED FILL — CITALOPRAM HBR 20 MG TABLET: 20 | 90 days supply | Qty: 90 | Fill #1

## 2020-03-13 MED FILL — ATORVASTATIN 40 MG TABLET: 40 | 90 days supply | Qty: 90 | Fill #1

## 2020-03-13 MED FILL — ZOLPIDEM TARTRATE 5 MG TAB: 5 | 30 days supply | Qty: 30 | Fill #2

## 2020-04-20 MED FILL — LOSARTAN-HCTZ 50-12.5 MG TA: 50-12.5 | 90 days supply | Qty: 90 | Fill #1

## 2020-04-26 ENCOUNTER — Other Ambulatory Visit: Payer: Self-pay

## 2020-04-26 ENCOUNTER — Encounter (INDEPENDENT_AMBULATORY_CARE_PROVIDER_SITE_OTHER): Payer: Medicare Other | Admitting: Ophthalmology

## 2020-04-26 DIAGNOSIS — H353114 Nonexudative age-related macular degeneration, right eye, advanced atrophic with subfoveal involvement: Secondary | ICD-10-CM | POA: Diagnosis not present

## 2020-04-26 DIAGNOSIS — H353221 Exudative age-related macular degeneration, left eye, with active choroidal neovascularization: Secondary | ICD-10-CM

## 2020-04-26 DIAGNOSIS — H2513 Age-related nuclear cataract, bilateral: Secondary | ICD-10-CM | POA: Diagnosis not present

## 2020-04-26 DIAGNOSIS — H43813 Vitreous degeneration, bilateral: Secondary | ICD-10-CM | POA: Diagnosis not present

## 2020-05-13 MED FILL — ZOLPIDEM TARTRATE 5 MG TAB: 5 | 30 days supply | Qty: 30 | Fill #4

## 2020-06-10 MED FILL — ZOLPIDEM TARTRATE 5 MG TAB: 5 | 30 days supply | Qty: 30 | Fill #5

## 2020-06-14 ENCOUNTER — Other Ambulatory Visit: Payer: Self-pay

## 2020-06-14 ENCOUNTER — Encounter (INDEPENDENT_AMBULATORY_CARE_PROVIDER_SITE_OTHER): Payer: Medicare Other | Admitting: Ophthalmology

## 2020-06-14 DIAGNOSIS — H353221 Exudative age-related macular degeneration, left eye, with active choroidal neovascularization: Secondary | ICD-10-CM

## 2020-06-14 DIAGNOSIS — H43813 Vitreous degeneration, bilateral: Secondary | ICD-10-CM

## 2020-06-14 DIAGNOSIS — H353114 Nonexudative age-related macular degeneration, right eye, advanced atrophic with subfoveal involvement: Secondary | ICD-10-CM | POA: Diagnosis not present

## 2020-06-21 MED FILL — ATORVASTATIN CALCIUM 40 MG: 40 | 90 days supply | Qty: 90 | Fill #2

## 2020-06-21 MED FILL — CITALOPRAM HBR 20 MG TABLET: 20 | 90 days supply | Qty: 90 | Fill #2

## 2020-06-23 ENCOUNTER — Other Ambulatory Visit: Payer: Self-pay | Admitting: Family Medicine

## 2020-06-23 MED FILL — FLOVENT HFA 110 MCG INHALER: 110 | 60 days supply | Qty: 12 | Fill #0

## 2020-07-08 ENCOUNTER — Other Ambulatory Visit: Payer: Self-pay | Admitting: Family Medicine

## 2020-07-09 MED FILL — ZOLPIDEM TARTRATE 5 MG TABS: 5 | 30 days supply | Qty: 30 | Fill #0

## 2020-07-09 NOTE — Telephone Encounter (Signed)
Needs follow up for diabetes- im filling for 1 month

## 2020-08-02 ENCOUNTER — Encounter (INDEPENDENT_AMBULATORY_CARE_PROVIDER_SITE_OTHER): Payer: Medicare Other | Admitting: Ophthalmology

## 2020-08-02 ENCOUNTER — Other Ambulatory Visit: Payer: Self-pay

## 2020-08-02 DIAGNOSIS — H35033 Hypertensive retinopathy, bilateral: Secondary | ICD-10-CM | POA: Diagnosis not present

## 2020-08-02 DIAGNOSIS — H353221 Exudative age-related macular degeneration, left eye, with active choroidal neovascularization: Secondary | ICD-10-CM

## 2020-08-02 DIAGNOSIS — I10 Essential (primary) hypertension: Secondary | ICD-10-CM

## 2020-08-02 DIAGNOSIS — H43813 Vitreous degeneration, bilateral: Secondary | ICD-10-CM | POA: Diagnosis not present

## 2020-08-02 DIAGNOSIS — H353112 Nonexudative age-related macular degeneration, right eye, intermediate dry stage: Secondary | ICD-10-CM

## 2020-08-02 MED FILL — BESIVANCE 0.6% SUSP: 0.6 | 25 days supply | Qty: 5 | Fill #0

## 2020-08-06 ENCOUNTER — Other Ambulatory Visit: Payer: Self-pay

## 2020-08-06 ENCOUNTER — Encounter: Payer: Self-pay | Admitting: Family Medicine

## 2020-08-06 ENCOUNTER — Ambulatory Visit (INDEPENDENT_AMBULATORY_CARE_PROVIDER_SITE_OTHER): Payer: Medicare Other | Admitting: Family Medicine

## 2020-08-06 ENCOUNTER — Other Ambulatory Visit: Payer: Self-pay | Admitting: Family Medicine

## 2020-08-06 VITALS — BP 110/80 | HR 73 | Temp 97.9°F | Ht 67.5 in | Wt 197.5 lb

## 2020-08-06 DIAGNOSIS — F5101 Primary insomnia: Secondary | ICD-10-CM | POA: Diagnosis not present

## 2020-08-06 DIAGNOSIS — Z23 Encounter for immunization: Secondary | ICD-10-CM

## 2020-08-06 DIAGNOSIS — E119 Type 2 diabetes mellitus without complications: Secondary | ICD-10-CM

## 2020-08-06 DIAGNOSIS — I152 Hypertension secondary to endocrine disorders: Secondary | ICD-10-CM

## 2020-08-06 DIAGNOSIS — E1159 Type 2 diabetes mellitus with other circulatory complications: Secondary | ICD-10-CM

## 2020-08-06 DIAGNOSIS — F330 Major depressive disorder, recurrent, mild: Secondary | ICD-10-CM | POA: Diagnosis not present

## 2020-08-06 LAB — POCT GLYCOSYLATED HEMOGLOBIN (HGB A1C): Hemoglobin A1C: 6.1 % — AB (ref 4.0–5.6)

## 2020-08-06 MED ORDER — LOSARTAN POTASSIUM-HCTZ 50-12.5 MG PO TABS
1.0000 | ORAL_TABLET | Freq: Every day | ORAL | 3 refills | Status: DC
Start: 2020-08-06 — End: 2020-08-06

## 2020-08-06 MED ORDER — FLOVENT HFA 110 MCG/ACT IN AERO: INHALATION_SPRAY | RESPIRATORY_TRACT | 12 refills | Status: DC

## 2020-08-06 MED ORDER — METFORMIN HCL 500 MG PO TABS
500.0000 mg | ORAL_TABLET | Freq: Every day | ORAL | 3 refills | Status: DC
Start: 2020-08-06 — End: 2020-08-06

## 2020-08-06 MED ORDER — ATORVASTATIN CALCIUM 40 MG PO TABS: 40.0000 mg | ORAL_TABLET | Freq: Every day | ORAL | 3 refills | Status: DC

## 2020-08-06 MED ORDER — ZOLPIDEM TARTRATE 10 MG PO TABS: 5.0000 mg | ORAL_TABLET | Freq: Every evening | ORAL | 1 refills | Status: DC | PRN

## 2020-08-06 MED ORDER — CITALOPRAM HYDROBROMIDE 20 MG PO TABS: ORAL_TABLET | ORAL | 3 refills | Status: DC

## 2020-08-06 MED FILL — METFORMIN HCL 500 MG TABS: 500 | 90 days supply | Qty: 90 | Fill #0

## 2020-08-06 MED FILL — ZOLPIDEM TARTRATE 10 MG TAB: 10 | 90 days supply | Qty: 90 | Fill #0

## 2020-08-06 MED FILL — FLOVENT HFA 110 MCG INHALER: 110 | 60 days supply | Qty: 12 | Fill #0

## 2020-08-06 MED FILL — LOSARTAN-HCTZ 50-12.5 MG TA: 50-12.5 | 90 days supply | Qty: 90 | Fill #0

## 2020-08-06 NOTE — Assessment & Plan Note (Signed)
At goal.  Continue Hyzaar 50-12.5 once daily.

## 2020-08-06 NOTE — Progress Notes (Signed)
   Lynn [name] is a 74 y.o. female who presents today for an office visit.  Assessment/Plan:  Chronic Problems Addressed Today: HTN associated with DM ([place]) At goal.  Continue Hyzaar 50-12.5 once daily.  T2DM (type 2 diabetes mellitus) (HCC) A1c stable at 6.1.  Continue lifestyle modifications.  Continue Metformin 500 mg daily.  Insomnia Stable.  Will refill Ambien today.  Database with no red flags.   Flu vaccine given today.     Subjective:  HPI:  See A/p.         Objective:  Physical Exam: BP 110/80   Pulse 73   Temp 97.9 F (36.6 C) (Temporal)   Ht 5' 7.5" (1.715 m)   Wt 197 lb 8 oz (89.6 kg)   SpO2 98%   BMI 30.48 kg/m   Wt Readings from Last 3 Encounters:  08/06/20 197 lb 8 oz (89.6 kg)  01/13/20 190 lb 4 oz (86.3 kg)  01/06/19 204 lb (92.5 kg)    Gen: No acute distress, resting comfortably CV: Regular rate and rhythm with no murmurs appreciated Pulm: Normal work of breathing, clear to auscultation bilaterally with no crackles, wheezes, or rhonchi Neuro: Grossly normal, moves all extremities Psych: Normal affect and thought content      Duward Allbritton M. Jerline Pain, MD 08/06/2020 2:52 PM

## 2020-08-06 NOTE — Patient Instructions (Signed)
It was very nice to see you today!  I will refill your medications today.  Your blood sugar looks great.  No changes today.  I will see you back in 6 months for your annual checkup with blood work.  Please come back see me sooner if needed.  Take care, Dr Jerline Pain  Please try these tips to maintain a healthy lifestyle:   Eat at least 3 REAL meals and 1-2 snacks per day.  Aim for no more than 5 hours between eating.  If you eat breakfast, please do so within one hour of getting up.    Each meal should contain half fruits/vegetables, one quarter protein, and one quarter carbs (no bigger than a computer mouse)   Cut down on sweet beverages. This includes juice, soda, and sweet tea.     Drink at least 1 glass of water with each meal and aim for at least 8 glasses per day   Exercise at least 150 minutes every week.

## 2020-08-06 NOTE — Assessment & Plan Note (Signed)
Stable.  Will refill Ambien today.  Database with no red flags.

## 2020-08-06 NOTE — Assessment & Plan Note (Signed)
A1c stable at 6.1.  Continue lifestyle modifications.  Continue Metformin 500 mg daily.

## 2020-09-20 ENCOUNTER — Encounter (INDEPENDENT_AMBULATORY_CARE_PROVIDER_SITE_OTHER): Payer: Medicare Other | Admitting: Ophthalmology

## 2020-09-28 ENCOUNTER — Encounter (INDEPENDENT_AMBULATORY_CARE_PROVIDER_SITE_OTHER): Payer: Medicare Other | Admitting: Ophthalmology

## 2020-09-28 ENCOUNTER — Other Ambulatory Visit: Payer: Self-pay

## 2020-09-29 ENCOUNTER — Encounter: Payer: Self-pay | Admitting: Physician Assistant

## 2020-09-29 ENCOUNTER — Other Ambulatory Visit: Payer: Self-pay | Admitting: *Deleted

## 2020-09-29 ENCOUNTER — Ambulatory Visit: Payer: Medicare Other | Admitting: Physician Assistant

## 2020-09-29 ENCOUNTER — Ambulatory Visit (INDEPENDENT_AMBULATORY_CARE_PROVIDER_SITE_OTHER): Payer: Medicare Other | Admitting: Physician Assistant

## 2020-09-29 VITALS — BP 146/70 | HR 95 | Temp 97.6°F | Ht 67.5 in | Wt 202.0 lb

## 2020-09-29 DIAGNOSIS — R300 Dysuria: Secondary | ICD-10-CM | POA: Diagnosis not present

## 2020-09-29 LAB — POCT URINALYSIS DIPSTICK
Bilirubin, UA: NEGATIVE
Blood, UA: NEGATIVE
Glucose, UA: NEGATIVE
Ketones, UA: NEGATIVE
Leukocytes, UA: NEGATIVE
Nitrite, UA: NEGATIVE
Protein, UA: NEGATIVE
Spec Grav, UA: 1.01 (ref 1.010–1.025)
Urobilinogen, UA: 0.2 E.U./dL
pH, UA: 5.5 (ref 5.0–8.0)

## 2020-09-29 MED ORDER — AMOXICILLIN-POT CLAVULANATE 875-125 MG PO TABS: 1.0000 | ORAL_TABLET | Freq: Two times a day (BID) | ORAL | 0 refills | Status: DC

## 2020-09-29 MED ORDER — CEFTRIAXONE SODIUM 1 G IJ SOLR
1.0000 g | Freq: Once | INTRAMUSCULAR | Status: AC
  Administered 2020-09-29: 1 g via INTRAMUSCULAR

## 2020-09-29 MED ORDER — CIPROFLOXACIN HCL 500 MG PO TABS: 500.0000 mg | ORAL_TABLET | Freq: Two times a day (BID) | ORAL | 0 refills | Status: DC

## 2020-09-29 MED FILL — ATORVASTATIN 40 MG TABLET: 40 | 90 days supply | Qty: 90 | Fill #3

## 2020-09-29 MED FILL — CITALOPRAM HBR 20 MG TABLET: 20 | 90 days supply | Qty: 90 | Fill #3

## 2020-09-29 NOTE — Progress Notes (Signed)
Called [facility], spoke to [place] asked her if any Cipro doses are available? She said no, told her okay verbal order given for Augmentin 875-125 mg one tablet twice a day x 10 days, dispense # 20, refill 0. Genevieve verbalized understanding.

## 2020-09-29 NOTE — Progress Notes (Signed)
Lynn [name] is a 74 y.o. female here for a new problem.  I acted as a Neurosurgeon for [employer], PA-C Corky Mull, LPN   History of Present Illness:   Chief Complaint  Patient presents with  . Dysuria    HPI    Dysuria Pt c/o urgency and burning with urination x 3-4 days. Having pelvic pressure.  Has back pain at baseline, denies any increase in back pain. Fever yesterday of 101, took Tylenol and her temperature improved.  Denies: fever, chills, nausea, vomiting, malaise, URI symptoms, vaginal discharge or vaginal bleeding  Reports history of urosepsis requiring hospitalization.   Past Medical History:  Diagnosis Date  . ASTHMA 03/14/2007   NO PROBELEM IN 12 YRS  . Blood dyscrasia    V[place] "FREE BLEEDER"  . 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 DEGENERATION, MACULAR NOS 03/14/2007  . DEPRESSION 03/14/2007  . DIABETES MELLITUS, TYPE II 03/14/2007  . Diarrhea 05/31/2007  . DISC DISEASE, LUMBAR 05/25/2009  . DIVERTICULOSIS OF COLON 01/11/2009  . HYPERLIPIDEMIA 06/21/2010  . HYPERTENSION 03/14/2007  . Lung abnormality    LT LUNG WITH SPOT HAS BEEN FOLLOWED X 3 YRS DR Edwyna Shell  . SEBORRHEIC KERATOSIS, INFLAMED 12/02/2010  . Unspecified hearing loss 01/13/2010  . VERTIGO 11/10/2010     Social History   Tobacco Use  . Smoking status: Former Smoker    Packs/day: 2.00    Years: 37.00    Pack years: 74.00    Types: Cigarettes    Quit date: 09/19/1999    Years since quitting: 21.0  . Smokeless tobacco: Never Used  Vaping Use  . Vaping Use: Never used  Substance Use Topics  . Alcohol use: Yes    Comment: rare  . Drug use: No    Past Surgical History:  Procedure Laterality Date  . CHOLECYSTECTOMY  1982   urosespsis post op  . JOINT REPLACEMENT  1998   1998 RT KNEE  +2009 REMOVED, REPLACED JOINT  . KNEE ARTHROSCOPY Right 1995   right, TNR  . left shoulder Left 2012   rotator cuff  . SPINE SURGERY  1996   fusion, ruptured disk  . TONSILLECTOMY   1952    Family History  Problem Relation Age of Onset  . Cancer Mother   . Diabetes Mother   . Heart disease Mother   . Hyperlipidemia Mother   . Hypertension Mother   . Hyperlipidemia Father   . Hypertension Father   . Colon cancer Maternal Uncle 82  . Colon cancer Maternal Uncle 80  . Heart disease Other   . Diabetes Other        diabeties  . Hyperlipidemia Other   . Hypertension Other   . Colon cancer Maternal Aunt 70    Allergies  Allergen Reactions  . Morphine And Related Itching    Patient states that she can still take this medication it just makes her itch.    Current Medications:   Current Outpatient Medications:  .  Accu-Chek FastClix Lancets MISC, USE TO TEST BLOOD SUGAR ONCE A DAY, Disp: 102 each, Rfl: 0 .  ACCU-CHEK SMARTVIEW test strip, TEST BLOOD SUGAR ONCE DAILY, Disp: 100 each, Rfl: 11 .  acetaminophen (TYLENOL) 325 MG tablet, Take 650 mg by mouth every 6 (six) hours as needed for moderate pain., Disp: , Rfl:  .  atorvastatin (LIPITOR) 40 MG tablet, Take 1 tablet (40 mg total) by mouth daily., Disp: 90 tablet, Rfl: 3 .  blood glucose  meter kit and supplies KIT, Test once daily for glucose control.  Dx E11.9, Disp: 1 each, Rfl: 0 .  Blood Glucose Monitoring Suppl (ACCU-CHEK GUIDE) w/Device KIT, 1 Device by Does not apply route daily., Disp: 1 kit, Rfl: 0 .  citalopram (CELEXA) 20 MG tablet, TAKE 1 TABLET BY MOUTH NIGHTLY AT BEDTIME, Disp: 90 tablet, Rfl: 3 .  docusate sodium (COLACE) 100 MG capsule, Take 100 mg by mouth daily as needed. Reported on 10/21/2015, Disp: , Rfl:  .  fluticasone (FLONASE) 50 MCG/ACT nasal spray, 1 spray up each nostril  at bedtime, Disp: 16 g, Rfl: 11 .  fluticasone (FLOVENT HFA) 110 MCG/ACT inhaler, INHALE 1 PUFF BY MOUTH INTO THE LUNGS TWICE DAILY, Disp: 12 g, Rfl: 12 .  losartan-hydrochlorothiazide (HYZAAR) 50-12.5 MG tablet, Take 1 tablet by mouth daily., Disp: 90 tablet, Rfl: 3 .  metFORMIN (GLUCOPHAGE) 500 MG tablet, Take 1  tablet (500 mg total) by mouth daily with breakfast., Disp: 90 tablet, Rfl: 3 .  Multiple Vitamins-Minerals (OCUVITE PO), Take 1 tablet by mouth daily. , Disp: , Rfl:  .  PROAIR HFA 108 (90 Base) MCG/ACT inhaler, INHALE 2 PUFFS BY MOUTH INTO THE LUNGS EVERY 6 HOURS AS NEEDED FOR WHEEZING OR SHORTNESS OF BREATH., Disp: 8.5 g, Rfl: 0 .  zolpidem (AMBIEN) 10 MG tablet, Take 0.5-1 tablets (5-10 mg total) by mouth at bedtime as needed. for sleep, Disp: 90 tablet, Rfl: 1 .  ciprofloxacin (CIPRO) 500 MG tablet, Take 1 tablet (500 mg total) by mouth 2 (two) times daily for 10 days., Disp: 20 tablet, Rfl: 0   Review of Systems:   ROS Negative unless otherwise specified per HPI.  Vitals:   Vitals:   09/29/20 1401  BP: (!) 146/70  Pulse: 95  Temp: 97.6 F (36.4 C)  TempSrc: Temporal  SpO2: 98%  Weight: 202 lb (91.6 kg)  Height: 5' 7.5" (1.715 m)     Body mass index is 31.17 kg/m.  Physical Exam:   Physical Exam Vitals and nursing note reviewed.  Constitutional:      General: She is not in acute distress.    Appearance: Normal appearance. She is well-developed. She is not ill-appearing or toxic-appearing.  Cardiovascular:     Rate and Rhythm: Normal rate and regular rhythm.     Pulses: Normal pulses.     Heart sounds: Normal heart sounds, S1 normal and S2 normal.     Comments: No LE edema Pulmonary:     Effort: Pulmonary effort is normal.     Breath sounds: Normal breath sounds.  Abdominal:     General: Bowel sounds are normal.     Tenderness: There is abdominal tenderness in the suprapubic area. There is no right CVA tenderness or left CVA tenderness.  Skin:    General: Skin is warm and dry.  Neurological:     Mental Status: She is alert.     GCS: GCS eye subscore is 4. GCS verbal subscore is 5. GCS motor subscore is 6.  Psychiatric:        Speech: Speech normal.        Behavior: Behavior normal. Behavior is cooperative.     Results for orders placed or performed in visit  on 09/29/20  POCT urinalysis dipstick  Result Value Ref Range   Color, UA yellow    Clarity, UA clear    Glucose, UA Negative Negative   Bilirubin, UA Negative    Ketones, UA Negative    Spec Grav,  UA 1.010 1.010 - 1.025   Blood, UA Negatie    pH, UA 5.5 5.0 - 8.0   Protein, UA Negative Negative   Urobilinogen, UA 0.2 0.2 or 1.0 E.U./dL   Nitrite, UA Negative    Leukocytes, UA Negative Negative   Appearance     Odor      Assessment and Plan:   Lynn [name] was seen today for dysuria.  Diagnoses and all orders for this visit:  Dysuria Symptoms and history concerning for pyelo. Significant hx of complicated UTI requiring hospitalization. Received 1 gram rocephin IM in office today and tolerated well. We ordered Cipro 500 mg BID but was informed that this was placed on backorder. Will start Augmentin 875-125 mg BID x 10 days. Worsening precautions advised. -     POCT urinalysis dipstick -     Urine Culture; Future  Other orders -     ciprofloxacin (CIPRO) 500 MG tablet; Take 1 tablet (500 mg total) by mouth 2 (two) times daily for 10 days.  CMA or LPN served as scribe during this visit. History, Physical, and Plan performed by medical provider. The above documentation has been reviewed and is accurate and complete.  Inda Coke, PA-C

## 2020-09-29 NOTE — Patient Instructions (Signed)
It was great to see you!  We gave you a rocephin injection today.  Start oral cipro antibiotic today.  We will be in touch with your urine culture results when they have returned.  General instructions  Make sure you: ? Pee until your bladder is empty. ? Do not hold pee for a long time. ? Empty your bladder after sex. ? Wipe from front to back after pooping if you are a female. Use each tissue one time when you wipe.  Drink enough fluid to keep your pee pale yellow.  Keep all follow-up visits as told by your doctor. This is important. Contact a doctor if:  You do not get better after 1-2 days.  Your symptoms go away and then come back. Get help right away if:  You have very bad back pain.  You have very bad pain in your lower belly.  You have a fever.  You are sick to your stomach (nauseous).  You are throwing up.   Take care,  Inda Coke PA-C

## 2020-10-04 ENCOUNTER — Other Ambulatory Visit: Payer: Self-pay | Admitting: Family Medicine

## 2020-10-04 ENCOUNTER — Telehealth: Payer: Self-pay

## 2020-10-04 MED ORDER — DICYCLOMINE HCL 20 MG PO TABS: 20.0000 mg | ORAL_TABLET | Freq: Three times a day (TID) | ORAL | 0 refills | Status: DC

## 2020-10-04 MED FILL — DICYCLOMINE 20 MG TABLET: 20 | 40 days supply | Qty: 120 | Fill #0

## 2020-10-04 NOTE — Telephone Encounter (Signed)
Pt states that she has had severe diarrhea on the augmentin [name] prescribed. Pt has stopped the medication. She is requesting something to help with her diarrhea. She has already tried otc medications.

## 2020-10-04 NOTE — Telephone Encounter (Signed)
How many days would you like pt to take RX?

## 2020-10-04 NOTE — Addendum Note (Signed)
Addended by: Karren Cobble on: 10/04/2020 02:43 PM   Modules accepted: Orders

## 2020-10-04 NOTE — Telephone Encounter (Signed)
Ok to send in bentyl 20mg  three times daily meals. She should take probiotics and fiber as well.  [name]. Jerline Pain, MD 10/04/2020 1:51 PM

## 2020-10-04 NOTE — Telephone Encounter (Signed)
Pt notified and voiced understanding 

## 2020-10-04 NOTE — Telephone Encounter (Signed)
Please advise 

## 2020-10-12 NOTE — Telephone Encounter (Signed)
Wanting this medicine to be sent to the pharmacy [place], [place]

## 2020-10-12 NOTE — Telephone Encounter (Signed)
Please advise 

## 2020-10-12 NOTE — Telephone Encounter (Signed)
Pt called stating she is still sick and experiencing UTI symptoms. Pt is asking if Dr. Jerline Pain can send in another prescription. Pt states she does not want to leave the house because she feels so bad. Please advise.

## 2020-10-13 NOTE — Telephone Encounter (Signed)
Please schedule pt for OV.

## 2020-10-13 NOTE — Telephone Encounter (Signed)
She needs OV so that we can get a urine sample.  [name]. Jerline Pain, MD 10/13/2020 8:00 AM

## 2020-10-14 NOTE — Telephone Encounter (Signed)
LVM asking to call back.  

## 2020-10-25 ENCOUNTER — Telehealth: Payer: Self-pay

## 2020-10-25 NOTE — Telephone Encounter (Signed)
See below

## 2020-10-25 NOTE — Telephone Encounter (Signed)
Left message to return call to our office at their convenience.  

## 2020-10-25 NOTE — Telephone Encounter (Signed)
Looks like she was seen for UTI.   We need another urine sample if she is still having symptoms.  [name]. Jerline Pain, MD 10/25/2020 4:13 PM

## 2020-10-25 NOTE — Telephone Encounter (Signed)
Patient was seen by Lynn [name] on 11/24 for this and has not improved does she need a additional appt

## 2020-10-25 NOTE — Telephone Encounter (Signed)
She needs an appointment. We have not seen her in the office for this issue.  [name]. Jerline Pain, MD 10/25/2020 11:46 AM

## 2020-10-25 NOTE — Telephone Encounter (Signed)
Patient states has been taking medication dicyclomine (BENTYL) 20 MG tablet but states symptoms are not improving. Can you prescribe additional medication  [place], [place]

## 2020-11-25 ENCOUNTER — Telehealth: Payer: Self-pay | Admitting: Family Medicine

## 2020-11-25 NOTE — Telephone Encounter (Signed)
Left message for patient to call back and schedule Medicare Annual Wellness Visit (AWV) either virtually OR in office.   Last AWV 04/14/15; please schedule at anytime with [facility]-Nurse Health Advisor at [facility].  This should be a 45 minute visit.

## 2021-01-11 MED FILL — ZOLPIDEM TARTRATE 10 MG TAB: 10 | 90 days supply | Qty: 90 | Fill #1

## 2021-05-06 ENCOUNTER — Other Ambulatory Visit (HOSPITAL_COMMUNITY): Payer: Self-pay

## 2021-05-06 ENCOUNTER — Telehealth: Payer: Self-pay

## 2021-05-06 ENCOUNTER — Other Ambulatory Visit: Payer: Self-pay | Admitting: Family Medicine

## 2021-05-06 NOTE — Telephone Encounter (Signed)
I called and left her a voicemail

## 2021-05-06 NOTE — Telephone Encounter (Signed)
[facility] called that Lynn [name] is there to pick up Ambien medication but the medication was expired back in March. Daniela would like to get a refill.

## 2021-05-06 NOTE — Telephone Encounter (Signed)
Please call pt and schedule an appt next week with Dr. Jerline Pain she is overdue. Also tell her Dr. Jerline Pain is out of the office till Tuesday will have to wait for refill on Ambien. Please send back to me once scheduled.

## 2021-05-10 ENCOUNTER — Other Ambulatory Visit (HOSPITAL_COMMUNITY): Payer: Self-pay

## 2021-05-16 ENCOUNTER — Telehealth: Payer: Self-pay

## 2021-05-16 NOTE — Chronic Care Management (AMB) (Signed)
  Chronic Care Management   Note  05/16/2021 Name: Lynn [name] MRN: [id number] DOB: 1946/02/12  Lynn [name] is a 75 y.o. year old female who is a primary care patient of Vivi Barrack, MD. I reached out to Marlana Salvage by phone today in response to a referral sent by [name] PCP, Vivi Barrack, MD      [name] was given information about Chronic Care Management services today including:  CCM service includes personalized support from designated clinical staff supervised by her physician, including individualized plan of care and coordination with other care providers 24/7 contact phone numbers for assistance for urgent and routine care needs. Service will only be billed when office clinical staff spend 20 minutes or more in a month to coordinate care. Only one practitioner may furnish and bill the service in a calendar month. The patient may stop CCM services at any time (effective at the end of the month) by phone call to the office staff. The patient will be responsible for cost sharing (co-pay) of up to 20% of the service fee (after annual deductible is met).  Patient did not agree to enrollment in care management services and does not wish to consider at this time.  Follow up plan: Patient declines engagement by the care management team. Appropriate care team members and provider have been notified via electronic communication.  The patient has been provided with contact information for the care management team and has been advised to call with any health related questions or concerns.   Noreene Larsson, [place], [place], [place] [address] Direct Dial: [phone] [email or web address]

## 2021-05-17 ENCOUNTER — Other Ambulatory Visit (HOSPITAL_COMMUNITY): Payer: Self-pay

## 2021-05-17 MED ORDER — ZOLPIDEM TARTRATE 10 MG PO TABS
ORAL_TABLET | ORAL | 1 refills | Status: DC
  Filled 2021-05-17 – 2021-06-21 (×2): qty 90, 90d supply, fill #0
  Filled 2021-11-04: qty 90, 90d supply, fill #1

## 2021-05-17 MED FILL — Atorvastatin Calcium Tab 40 MG (Base Equivalent): ORAL | 90 days supply | Qty: 90 | Fill #0 | Status: AC

## 2021-05-17 NOTE — Addendum Note (Signed)
Addended by: Vivi Barrack on: 05/17/2021 03:30 PM   Modules accepted: Orders

## 2021-05-17 NOTE — Telephone Encounter (Signed)
On last AVS, it looks like Dr. Jerline Pain was requesting patient to come back for CPE.     Patient was not able to come in until the end of the month but had to be an afternoon appt.   Patient will only ride with one person she knows and will not use transportation services.   I have scheduled patient for 8/15 for CPE.    Patient is requesting medication to be filled up until this date.  Patient uses [facility] outpatient pharmacy.

## 2021-05-17 NOTE — Telephone Encounter (Signed)
FYI

## 2021-05-25 ENCOUNTER — Other Ambulatory Visit (HOSPITAL_COMMUNITY): Payer: Self-pay

## 2021-06-20 ENCOUNTER — Other Ambulatory Visit: Payer: Self-pay

## 2021-06-20 ENCOUNTER — Ambulatory Visit (INDEPENDENT_AMBULATORY_CARE_PROVIDER_SITE_OTHER): Payer: Medicare Other | Admitting: Family Medicine

## 2021-06-20 ENCOUNTER — Encounter: Payer: Self-pay | Admitting: Family Medicine

## 2021-06-20 ENCOUNTER — Other Ambulatory Visit (HOSPITAL_COMMUNITY): Payer: Self-pay

## 2021-06-20 VITALS — BP 122/73 | HR 101 | Temp 97.8°F | Ht 67.5 in | Wt 189.4 lb

## 2021-06-20 DIAGNOSIS — E1169 Type 2 diabetes mellitus with other specified complication: Secondary | ICD-10-CM | POA: Diagnosis not present

## 2021-06-20 DIAGNOSIS — E785 Hyperlipidemia, unspecified: Secondary | ICD-10-CM

## 2021-06-20 DIAGNOSIS — J4520 Mild intermittent asthma, uncomplicated: Secondary | ICD-10-CM | POA: Diagnosis not present

## 2021-06-20 DIAGNOSIS — F5101 Primary insomnia: Secondary | ICD-10-CM

## 2021-06-20 DIAGNOSIS — I152 Hypertension secondary to endocrine disorders: Secondary | ICD-10-CM

## 2021-06-20 DIAGNOSIS — F321 Major depressive disorder, single episode, moderate: Secondary | ICD-10-CM

## 2021-06-20 DIAGNOSIS — Z0001 Encounter for general adult medical examination with abnormal findings: Secondary | ICD-10-CM

## 2021-06-20 DIAGNOSIS — E1159 Type 2 diabetes mellitus with other circulatory complications: Secondary | ICD-10-CM

## 2021-06-20 DIAGNOSIS — E119 Type 2 diabetes mellitus without complications: Secondary | ICD-10-CM

## 2021-06-20 MED ORDER — ALBUTEROL SULFATE HFA 108 (90 BASE) MCG/ACT IN AERS
2.0000 | INHALATION_SPRAY | Freq: Four times a day (QID) | RESPIRATORY_TRACT | 0 refills | Status: DC | PRN
  Filled 2021-06-20: qty 8.5, 25d supply, fill #0

## 2021-06-20 MED ORDER — FLUOXETINE HCL 20 MG PO CAPS
20.0000 mg | ORAL_CAPSULE | Freq: Every day | ORAL | 3 refills | Status: DC
  Filled 2021-06-20 (×2): qty 90, 90d supply, fill #0

## 2021-06-20 NOTE — Assessment & Plan Note (Signed)
Stable on Ambien as needed.

## 2021-06-20 NOTE — Assessment & Plan Note (Signed)
Continue Flovent and albuterol 

## 2021-06-20 NOTE — Assessment & Plan Note (Signed)
At goal.  Continue Hyzaar 50-12.5 once daily.

## 2021-06-20 NOTE — Patient Instructions (Signed)
It was very nice to see you today!  We will check blood work today.  I will restart your Prozac.  Please send me a message in a few weeks on how this is working.  I would like to see back in 6 months to recheck your A1c.  Please come back to see me sooner if needed.  Take care, Dr Jerline Pain  PLEASE NOTE:  If you had any lab tests please let us know if you have not heard back within a few days. You may see your results on mychart before we have a chance to review them but we will give you a call once they are reviewed by [place]. If we ordered any referrals today, please let us know if you have not heard from their office within the next week.   Please try these tips to maintain a healthy lifestyle:  Eat at least 3 REAL meals and 1-2 snacks per day.  Aim for no more than 5 hours between eating.  If you eat breakfast, please do so within one hour of getting up.   Each meal should contain half fruits/vegetables, one quarter protein, and one quarter carbs (no bigger than a computer mouse)  Cut down on sweet beverages. This includes juice, soda, and sweet tea.   Drink at least 1 glass of water with each meal and aim for at least 8 glasses per day  Exercise at least 150 minutes every week.    Preventive Care 75 Years and Older, Female Preventive care refers to lifestyle choices and visits with your health care provider that can promote health and wellness. This includes: A yearly physical exam. This is also called an annual wellness visit. Regular dental and eye exams. Immunizations. Screening for certain conditions. Healthy lifestyle choices, such as: Eating a healthy diet. Getting regular exercise. Not using drugs or products that contain nicotine and tobacco. Limiting alcohol use. What can I expect for my preventive care visit? Physical exam Your health care provider will check your: Height and weight. These may be used to calculate your BMI (body mass index). BMI is a measurement that  tells if you are at a healthy weight. Heart rate and blood pressure. Body temperature. Skin for abnormal spots. Counseling Your health care provider may ask you questions about your: Past medical problems. Family's medical history. Alcohol, tobacco, and drug use. Emotional well-being. Home life and relationship well-being. Sexual activity. Diet, exercise, and sleep habits. History of falls. Memory and ability to understand (cognition). Work and work Statistician. Pregnancy and menstrual history. Access to firearms. What immunizations do I need?  Vaccines are usually given at various ages, according to a schedule. Your health care provider will recommend vaccines for you based on your age, medicalhistory, and lifestyle or other factors, such as travel or where you work. What tests do I need? Blood tests Lipid and cholesterol levels. These may be checked every 5 years, or more often depending on your overall health. Hepatitis C test. Hepatitis B test. Screening Lung cancer screening. You may have this screening every year starting at age 51 if you have a 30-pack-year history of smoking and currently smoke or have quit within the past 15 years. Colorectal cancer screening. All adults should have this screening starting at age 55 and continuing until age 47. Your health care provider may recommend screening at age 51 if you are at increased risk. You will have tests every 1-10 years, depending on your results and the type of screening test.  Diabetes screening. This is done by checking your blood sugar (glucose) after you have not eaten for a while (fasting). You may have this done every 1-3 years. Mammogram. This may be done every 1-2 years. Talk with your health care provider about how often you should have regular mammograms. Abdominal aortic aneurysm (AAA) screening. You may need this if you are a current or former smoker. BRCA-related cancer screening. This may be done if you  have a family history of breast, ovarian, tubal, or peritoneal cancers. Other tests STD (sexually transmitted disease) testing, if you are at risk. Bone density scan. This is done to screen for osteoporosis. You may have this done starting at age 54. Talk with your health care provider about your test results, treatment options,and if necessary, the need for more tests. Follow these instructions at home: Eating and drinking  Eat a diet that includes fresh fruits and vegetables, whole grains, lean protein, and low-fat dairy products. Limit your intake of foods with high amounts of sugar, saturated fats, and salt. Take vitamin and mineral supplements as recommended by your health care provider. Do not drink alcohol if your health care provider tells you not to drink. If you drink alcohol: Limit how much you have to 0-1 drink a day. Be aware of how much alcohol is in your drink. In the [place] one drink equals one 12 oz bottle of beer (355 mL), one 5 oz glass of wine (148 mL), or one 1 oz glass of hard liquor (44 mL).  Lifestyle Take daily care of your teeth and gums. Brush your teeth every morning and night with fluoride toothpaste. Floss one time each day. Stay active. Exercise for at least 30 minutes 5 or more days each week. Do not use any products that contain nicotine or tobacco, such as cigarettes, e-cigarettes, and chewing tobacco. If you need help quitting, ask your health care provider. Do not use drugs. If you are sexually active, practice safe sex. Use a condom or other form of protection in order to prevent STIs (sexually transmitted infections). Talk with your health care provider about taking a low-dose aspirin or statin. Find healthy ways to cope with stress, such as: Meditation, yoga, or listening to music. Journaling. Talking to a trusted person. Spending time with friends and family. Safety Always wear your seat belt while driving or riding in a vehicle. Do not drive: If  you have been drinking alcohol. Do not ride with someone who has been drinking. When you are tired or distracted. While texting. Wear a helmet and other protective equipment during sports activities. If you have firearms in your house, make sure you follow all gun safety procedures. What's next? Visit your health care provider once a year for an annual wellness visit. Ask your health care provider how often you should have your eyes and teeth checked. Stay up to date on all vaccines. This information is not intended to replace advice given to you by your health care provider. Make sure you discuss any questions you have with your healthcare provider. Document Revised: 10/13/2020 Document Reviewed: 10/17/2018 Elsevier Patient Education  2022 [employer].

## 2021-06-20 NOTE — Assessment & Plan Note (Signed)
Check lipids.  Continue atorvastatin 40 mg daily.

## 2021-06-20 NOTE — Progress Notes (Signed)
Chief Complaint:  Lynn [name] is a 75 y.o. female who presents today for her annual comprehensive physical exam.    Assessment/Plan:  Chronic Problems Addressed Today: Asthma Continue Flovent and albuterol.  HTN associated with DM ([place]) At goal.  Continue Hyzaar 50-12.5 once daily.  Depression, major, single episode, moderate (HCC) Symptoms are not controlled.  No reported SI or HI.  Patient said she had been on Prozac for several years.  It is unclear how she got switched to Celexa.  We will restart Prozac 20 mg daily.  Stop Celexa.  She will follow-up with me in a few weeks via phone call or MyChart.  Dyslipidemia associated with T2DM (HCC) Check lipids.  Continue atorvastatin 40 mg daily.  T2DM (type 2 diabetes mellitus) (HCC) Check A1c.  On metformin 500 mg daily.  Insomnia Stable on Ambien as needed.    Preventative Healthcare: Check Labs.  Declined colon cancer screening.  Patient Counseling(The following topics were reviewed and/or handout was given):  -Nutrition: Stressed importance of moderation in sodium/caffeine intake, saturated fat and cholesterol, caloric balance, sufficient intake of fresh fruits, vegetables, and fiber.  -Stressed the importance of regular exercise.   -Substance Abuse: Discussed cessation/primary prevention of tobacco, alcohol, or other drug use; driving or other dangerous activities under the influence; availability of treatment for abuse.   -Injury prevention: Discussed safety belts, safety helmets, smoke detector, smoking near bedding or upholstery.   -Sexuality: Discussed sexually transmitted diseases, partner selection, use of condoms, avoidance of unintended pregnancy and contraceptive alternatives.   -Dental health: Discussed importance of regular tooth brushing, flossing, and dental visits.  -Health maintenance and immunizations reviewed. Please refer to Health maintenance section.  Return to care in 1 year for next preventative  visit.     Subjective:  HPI:  She has no acute complaints today.   Since her last medication refill she has not had her Prozac, which she expresses a concern about. Her friend who is with her has noted a very significant negative difference in behavior in mood since then.   The friend seems to keep track of various medications/medical history better than the patient does. She is compliant with all medication with no notable side effects.   Lifestyle Diet: Reasonably healthy diet  Exercise: Does not exercise currently   Depression screen PHQ 2/9 10/21/2015  Decreased Interest 0  Down, Depressed, Hopeless 0  PHQ - 2 Score 0    Health Maintenance Due  Topic Date Due   FOOT EXAM  07/19/2016   Zoster Vaccines- Shingrix (2 of 2) 10/02/2019   OPHTHALMOLOGY EXAM  12/20/2019   HEMOGLOBIN A1C  02/04/2021   INFLUENZA VACCINE  06/06/2021     ROS: Per HPI, otherwise a complete review of systems was negative.   PMH:  The following were reviewed and entered/updated in epic: Past Medical History:  Diagnosis Date   ASTHMA 03/14/2007   NO PROBELEM IN 12 YRS   Blood dyscrasia    VON WILLIBRANDE "FREE BLEEDER"   COLONIC POLYPS, ADENOMATOUS, 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   DEGENERATION, MACULAR NOS 03/14/2007   DEPRESSION 03/14/2007   DIABETES MELLITUS, TYPE II 03/14/2007   Diarrhea 05/31/2007   [place] DISEASE, LUMBAR 05/25/2009   DIVERTICULOSIS OF COLON 01/11/2009   HYPERLIPIDEMIA 06/21/2010   HYPERTENSION 03/14/2007   Lung abnormality    LT LUNG WITH SPOT HAS BEEN FOLLOWED X 3 YRS DR BURNEY   SEBORRHEIC KERATOSIS, INFLAMED 12/02/2010   Unspecified hearing loss 01/13/2010   VERTIGO 11/10/2010  Patient Active Problem List   Diagnosis Date Noted   Stopped smoking with greater than 40 pack year history 09/01/2016   Lung nodule, solitary 09/01/2016   Insomnia 01/14/2015   Allergic rhinitis 10/13/2013   Dyslipidemia associated with T2DM ([place]) 06/21/2010   [place] DISEASE, LUMBAR 05/25/2009   DIVERTICULOSIS OF  COLON 01/11/2009   COLONIC POLYPS, ADENOMATOUS, 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   T2DM (type 2 diabetes mellitus) ([place]) 03/14/2007   Depression, major, single episode, moderate ([place]) 03/14/2007   HTN associated with DM ([place]) 03/14/2007   Asthma 03/14/2007   Past Surgical History:  Procedure Laterality Date   CHOLECYSTECTOMY  1982   urosespsis post op   [place] RT KNEE  +2009 REMOVED, REPLACED JOINT   KNEE ARTHROSCOPY Right 1995   right, TNR   left shoulder Left 2012   rotator cuff   SPINE SURGERY  1996   fusion, ruptured disk   TONSILLECTOMY  1952    Family History  Problem Relation Age of Onset   Cancer Mother    Diabetes Mother    Heart disease Mother    Hyperlipidemia Mother    Hypertension Mother    Hyperlipidemia Father    Hypertension Father    Colon cancer Maternal Uncle 82   Colon cancer Maternal Uncle 61   Heart disease Other    Diabetes Other        diabeties   Hyperlipidemia Other    Hypertension Other    Colon cancer Maternal Aunt 67    Medications- reviewed and updated Current Outpatient Medications  Medication Sig Dispense Refill   Accu-Chek FastClix Lancets MISC USE TO TEST BLOOD SUGAR ONCE A DAY 102 each 0   ACCU-CHEK SMARTVIEW test strip TEST BLOOD SUGAR ONCE DAILY 100 each 11   acetaminophen (TYLENOL) 325 MG tablet Take 650 mg by mouth every 6 (six) hours as needed for moderate pain.     atorvastatin (LIPITOR) 40 MG tablet TAKE 1 TABLET BY MOUTH ONCE A DAY 90 tablet 3   blood glucose meter kit and supplies KIT Test once daily for glucose control.  Dx E11.9 1 each 0   Blood Glucose Monitoring Suppl (ACCU-CHEK GUIDE) w/Device KIT 1 Device by Does not apply route daily. 1 kit 0   dicyclomine (BENTYL) 20 MG tablet TAKE 1 TABLET BY MOUTH 3 TIMES DAILY BEFORE MEALS 120 tablet 0   docusate sodium (COLACE) 100 MG capsule Take 100 mg by mouth daily as needed. Reported on 10/21/2015     FLUoxetine (PROZAC) 20 MG capsule Take 1 capsule (20 mg total)  by mouth daily. 90 capsule 3   fluticasone (FLONASE) 50 MCG/ACT nasal spray 1 spray up each nostril  at bedtime 16 g 11   fluticasone (FLOVENT HFA) 110 MCG/ACT inhaler INHALE 1 PUFF BY MOUTH INTO THE LUNGS TWICE DAILY 12 g 12   losartan-hydrochlorothiazide (HYZAAR) 50-12.5 MG tablet TAKE 1 TABLET BY MOUTH DAILY. 90 tablet 3   metFORMIN (GLUCOPHAGE) 500 MG tablet TAKE 1 TABLET BY MOUTH ONCE A DAY WITH BREAKFAST 90 tablet 3   Multiple Vitamins-Minerals (OCUVITE PO) Take 1 tablet by mouth daily.      zolpidem (AMBIEN) 10 MG tablet TAKE 1/2 - 1 TABLET BY MOUTH AT BEDTIME AS NEEDED FOR SLEEP 90 tablet 1   albuterol (PROAIR HFA) 108 (90 Base) MCG/ACT inhaler Inhale 2 puffs into the lungs every 6  hours as needed for wheezing or shortness of breath. 8.5 g 0   No  current facility-administered medications for this visit.    Allergies-reviewed and updated Allergies  Allergen Reactions   Morphine And Related Itching    Patient states that she can still take this medication it just makes her itch.    Social History   Socioeconomic History   Marital status: Single    Spouse name: Not on file   Number of children: Not on file   Years of education: Not on file   Highest education level: Not on file  Occupational History   Not on file  Tobacco Use   Smoking status: Former    Packs/day: 2.00    Years: 37.00    Pack years: 74.00    Types: Cigarettes    Quit date: 09/19/1999    Years since quitting: 21.7   Smokeless tobacco: Never  Vaping Use   Vaping Use: Never used  Substance and Sexual Activity   Alcohol use: Yes    Comment: rare   Drug use: No   Sexual activity: Not on file  Other Topics Concern   Not on file  Social History Narrative   Not on file   Social Determinants of Health   Financial Resource Strain: Not on file  Food Insecurity: Not on file  Transportation Needs: Not on file  Physical Activity: Not on file  Stress: Not on file  Social Connections: Not on file         Objective:  Physical Exam: BP 122/73   Pulse (!) 101   Temp 97.8 F (36.6 C) (Temporal)   Ht 5' 7.5" (1.715 m)   Wt 189 lb 6.4 oz (85.9 kg)   SpO2 99%   BMI 29.23 kg/m   Body mass index is 29.23 kg/m. Wt Readings from Last 3 Encounters:  06/20/21 189 lb 6.4 oz (85.9 kg)  09/29/20 202 lb (91.6 kg)  08/06/20 197 lb 8 oz (89.6 kg)   Gen: NAD, resting comfortably HEENT: TMs normal bilaterally. OP clear. No thyromegaly noted.  CV: RRR with no murmurs appreciated Pulm: NWOB, CTAB with no crackles, wheezes, or rhonchi GI: Normal bowel sounds present. Soft, Nontender, Nondistended. MSK: no edema, cyanosis, or clubbing noted Skin: warm, dry Neuro: CN2-12 grossly intact. Strength 5/5 in upper and lower extremities. Reflexes symmetric and intact bilaterally.  Psych: Normal affect and thought content     I,Jordan Kelly,acting as a scribe for Dimas Chyle, MD.,have documented all relevant documentation on the behalf of Dimas Chyle, MD,as directed by  Dimas Chyle, MD while in the presence of Dimas Chyle, MD.  I, Dimas Chyle, MD, have reviewed all documentation for this visit. The documentation on 06/20/21 for the exam, diagnosis, procedures, and orders are all accurate and complete.  [name]. Jerline Pain, MD 06/20/2021 2:51 PM

## 2021-06-20 NOTE — Assessment & Plan Note (Addendum)
Symptoms are not controlled.  No reported SI or HI.  Patient said she had been on Prozac for several years.  It is unclear how she got switched to Celexa.  We will restart Prozac 20 mg daily.  Stop Celexa.  She will follow-up with me in a few weeks via phone call or MyChart.

## 2021-06-20 NOTE — Assessment & Plan Note (Signed)
Check A1c.  On metformin 500 mg daily. 

## 2021-06-21 ENCOUNTER — Other Ambulatory Visit (HOSPITAL_COMMUNITY): Payer: Self-pay

## 2021-06-21 ENCOUNTER — Telehealth: Payer: Self-pay

## 2021-06-21 LAB — COMPREHENSIVE METABOLIC PANEL
ALT: 12 U/L (ref 0–35)
AST: 20 U/L (ref 0–37)
Albumin: 4.3 g/dL (ref 3.5–5.2)
Alkaline Phosphatase: 99 U/L (ref 39–117)
BUN: 15 mg/dL (ref 6–23)
CO2: 27 mEq/L (ref 19–32)
Calcium: 10.3 mg/dL (ref 8.4–10.5)
Chloride: 98 mEq/L (ref 96–112)
Creatinine, Ser: 0.92 mg/dL (ref 0.40–1.20)
GFR: 61.1 mL/min (ref >60.00)
Glucose, Bld: 127 mg/dL — ABNORMAL HIGH (ref 70–99)
Potassium: 4.3 mEq/L (ref 3.5–5.1)
Sodium: 137 mEq/L (ref 135–145)
Total Bilirubin: 1.2 mg/dL (ref 0.2–1.2)
Total Protein: 6.6 g/dL (ref 6.0–8.3)

## 2021-06-21 LAB — CBC
HCT: 47.2 % — ABNORMAL HIGH (ref 36.0–46.0)
Hemoglobin: 15.6 g/dL — ABNORMAL HIGH (ref 12.0–15.0)
MCHC: 33 g/dL (ref 30.0–36.0)
MCV: 90.7 fl (ref 78.0–100.0)
Platelets: 174 10*3/uL (ref 150.0–400.0)
RBC: 5.21 Mil/uL — ABNORMAL HIGH (ref 3.87–5.11)
RDW: 14.4 % (ref 11.5–15.5)
WBC: 7.6 10*3/uL (ref 4.0–10.5)

## 2021-06-21 LAB — LIPID PANEL
Cholesterol: 181 mg/dL (ref 0–200)
HDL: 79.1 mg/dL (ref >39.00)
LDL Cholesterol: 84 mg/dL (ref 0–99)
NonHDL: 101.4
Total CHOL/HDL Ratio: 2
Triglycerides: 89 mg/dL (ref 0.0–149.0)
VLDL: 17.8 mg/dL (ref 0.0–40.0)

## 2021-06-21 LAB — HEMOGLOBIN A1C: Hgb A1c MFr Bld: 7 % — ABNORMAL HIGH (ref 4.6–6.5)

## 2021-06-21 LAB — TSH: TSH: 2.99 u[IU]/mL (ref 0.35–5.50)

## 2021-06-21 NOTE — Telephone Encounter (Signed)
.   Encourage patient to contact the pharmacy for refills or they can request refills through [place]:  Please schedule appointment if longer than 1 year  NEXT APPOINTMENT DATE:  MEDICATION:zolpidem (AMBIEN) 10 MG tablet    Is the patient out of medication?   [place]

## 2021-06-21 NOTE — Telephone Encounter (Signed)
She had 90 day supply sent in last month.  [name]. Jerline Pain, MD 06/21/2021 3:57 PM

## 2021-06-22 NOTE — Progress Notes (Signed)
Please inform patient of the following:  Her A1c is up compared to last time but everything else is normal.  We do not need to make any medication changes but I would like her to come back in 3 to 6 months to recheck.  She should continue working on diet and exercise.

## 2021-06-22 NOTE — Telephone Encounter (Signed)
Lvm to make pt aware of refills at pharmacy.

## 2021-08-12 ENCOUNTER — Telehealth: Payer: Self-pay | Admitting: *Deleted

## 2021-08-12 NOTE — Telephone Encounter (Signed)
Agent contacted nurse triage regarding patient who had contact service to try to schedule her COVID booster. She is upset because she is not familiar with the pharmacy at [facility] and told the agent she guessed she would just die. Patient disconnected the line before transfer- and when agent tried to get her back- she did not get answer. Agent advised to contact PCP regarding patient - let them know patient is upset about not being able to find a pharmacy to get her COVID booster- she states she does not have her card and the local pharmacy close to her told her she had to have it. Jonelle Sidle is going to send a message requesting help for this patient from PCP.

## 2021-08-25 ENCOUNTER — Other Ambulatory Visit (HOSPITAL_COMMUNITY): Payer: Self-pay

## 2021-08-26 ENCOUNTER — Other Ambulatory Visit (HOSPITAL_COMMUNITY): Payer: Self-pay

## 2021-09-05 ENCOUNTER — Other Ambulatory Visit (HOSPITAL_COMMUNITY): Payer: Self-pay

## 2021-09-05 ENCOUNTER — Other Ambulatory Visit: Payer: Self-pay | Admitting: Family Medicine

## 2021-09-05 ENCOUNTER — Telehealth: Payer: Self-pay

## 2021-09-05 DIAGNOSIS — F330 Major depressive disorder, recurrent, mild: Secondary | ICD-10-CM

## 2021-09-05 MED ORDER — METFORMIN HCL 500 MG PO TABS
ORAL_TABLET | Freq: Every day | ORAL | 1 refills | Status: DC
  Filled 2021-09-05: qty 90, 90d supply, fill #0
  Filled 2022-01-04: qty 90, 90d supply, fill #1

## 2021-09-05 NOTE — Telephone Encounter (Signed)
Patient called in and stated she figured out why she stopped taking Prozac wanted to let Dr. Jerline Pain she stopped taking and he was right that she would like to restart taking  Celexa 20mg . If this could be sent in to  [facility] Phone:  [phone]  Fax:  [phone]     Patient would like call back once its been sent in.

## 2021-09-06 ENCOUNTER — Other Ambulatory Visit (HOSPITAL_COMMUNITY): Payer: Self-pay

## 2021-09-06 ENCOUNTER — Other Ambulatory Visit: Payer: Self-pay | Admitting: Family Medicine

## 2021-09-06 DIAGNOSIS — F330 Major depressive disorder, recurrent, mild: Secondary | ICD-10-CM

## 2021-09-07 ENCOUNTER — Other Ambulatory Visit: Payer: Self-pay

## 2021-09-07 ENCOUNTER — Other Ambulatory Visit (HOSPITAL_COMMUNITY): Payer: Self-pay

## 2021-09-07 MED ORDER — CITALOPRAM HYDROBROMIDE 20 MG PO TABS
20.0000 mg | ORAL_TABLET | Freq: Every day | ORAL | 3 refills | Status: DC
  Filled 2021-09-07: qty 30, 30d supply, fill #0
  Filled 2022-01-04: qty 30, 30d supply, fill #1

## 2021-09-07 NOTE — Telephone Encounter (Signed)
Patient notified Rx sent in Discontinued Prozac

## 2021-09-07 NOTE — Telephone Encounter (Signed)
Ok to send in celexa 20mg  daily. She should stay off prozac - please remove form her medication list if she is no longer taking this.  [name]. Jerline Pain, MD 09/07/2021 9:29 AM

## 2021-09-08 ENCOUNTER — Other Ambulatory Visit (HOSPITAL_COMMUNITY): Payer: Self-pay

## 2021-11-04 ENCOUNTER — Other Ambulatory Visit (HOSPITAL_COMMUNITY): Payer: Self-pay

## 2021-11-23 ENCOUNTER — Telehealth: Payer: Self-pay | Admitting: Family Medicine

## 2021-11-23 NOTE — Telephone Encounter (Signed)
Spoke with patient to schedule Awv she stated to call her back next month to schedule

## 2021-12-15 ENCOUNTER — Telehealth: Payer: Self-pay | Admitting: Family Medicine

## 2021-12-15 NOTE — Telephone Encounter (Signed)
Attempted to schedule AWV. Unable to LVM.  Will try at later time.  

## 2022-01-04 ENCOUNTER — Other Ambulatory Visit (HOSPITAL_COMMUNITY): Payer: Self-pay

## 2022-01-04 ENCOUNTER — Other Ambulatory Visit: Payer: Self-pay | Admitting: Family Medicine

## 2022-01-04 MED ORDER — ATORVASTATIN CALCIUM 40 MG PO TABS
ORAL_TABLET | Freq: Every day | ORAL | 3 refills | Status: DC
  Filled 2022-01-04 – 2022-01-19 (×2): qty 90, 90d supply, fill #0

## 2022-01-11 ENCOUNTER — Other Ambulatory Visit (HOSPITAL_COMMUNITY): Payer: Self-pay

## 2022-01-12 ENCOUNTER — Other Ambulatory Visit (HOSPITAL_COMMUNITY): Payer: Self-pay

## 2022-01-19 ENCOUNTER — Other Ambulatory Visit (HOSPITAL_COMMUNITY): Payer: Self-pay

## 2022-02-09 ENCOUNTER — Ambulatory Visit (INDEPENDENT_AMBULATORY_CARE_PROVIDER_SITE_OTHER): Payer: Medicare Other | Admitting: Nurse Practitioner

## 2022-02-09 VITALS — BP 120/64 | HR 96 | Temp 97.8°F | Ht 67.5 in | Wt 185.4 lb

## 2022-02-09 DIAGNOSIS — E1165 Type 2 diabetes mellitus with hyperglycemia: Secondary | ICD-10-CM

## 2022-02-09 DIAGNOSIS — E1159 Type 2 diabetes mellitus with other circulatory complications: Secondary | ICD-10-CM | POA: Diagnosis not present

## 2022-02-09 DIAGNOSIS — I152 Hypertension secondary to endocrine disorders: Secondary | ICD-10-CM

## 2022-02-09 DIAGNOSIS — D582 Other hemoglobinopathies: Secondary | ICD-10-CM

## 2022-02-09 LAB — BASIC METABOLIC PANEL
BUN: 12 mg/dL (ref 6–23)
CO2: 32 mEq/L (ref 19–32)
Calcium: 10.5 mg/dL (ref 8.4–10.5)
Chloride: 101 mEq/L (ref 96–112)
Creatinine, Ser: 0.83 mg/dL (ref 0.40–1.20)
GFR: 68.83 mL/min (ref >60.00)
Glucose, Bld: 129 mg/dL — ABNORMAL HIGH (ref 70–99)
Potassium: 3.8 mEq/L (ref 3.5–5.1)
Sodium: 140 mEq/L (ref 135–145)

## 2022-02-09 LAB — HEMOGLOBIN A1C: Hgb A1c MFr Bld: 6.6 % — ABNORMAL HIGH (ref 4.6–6.5)

## 2022-02-09 LAB — CBC
HCT: 49.6 % — ABNORMAL HIGH (ref 36.0–46.0)
Hemoglobin: 16.2 g/dL — ABNORMAL HIGH (ref 12.0–15.0)
MCHC: 32.7 g/dL (ref 30.0–36.0)
MCV: 89.8 fl (ref 78.0–100.0)
Platelets: 163 10*3/uL (ref 150.0–400.0)
RBC: 5.52 Mil/uL — ABNORMAL HIGH (ref 3.87–5.11)
RDW: 14.2 % (ref 11.5–15.5)
WBC: 7.9 10*3/uL (ref 4.0–10.5)

## 2022-02-09 MED ORDER — LOSARTAN POTASSIUM-HCTZ 50-12.5 MG PO TABS: 0.5000 | ORAL_TABLET | Freq: Every day | ORAL | 3 refills | Status: DC

## 2022-02-09 NOTE — Progress Notes (Signed)
? ? ? ?Subjective:  ?Patient ID: Lynn [name], female    DOB: 1946-08-22  Age: 76 y.o. MRN: [id number] ? ?CC:  ?Chief Complaint  ?Patient presents with  ? TRANSFER OF CARE  ?  ? ? ?HPI  ?This patient arrives today for the above. ? ?She is establishing care with me as her new primary care provider.  She has no acute complaints today.  Per chart review I do see that she has type 2 diabetes, last A1c was collected approximately 8 months ago.  It was 7.0 at that time.  She continues on metformin 500 mg a mouth daily.  She is also on ARB and her antihypertensive which is losartan-hydrochlorothiazide.  She tolerates that well.  Last microalbuminuria was checked a few years ago and it was less than 30.  She tells me she is not able to urinate today.  She is also on atorvastatin 40 mg by mouth daily, last LDL from 8 months ago was 84.  I also see that her hemoglobin was slightly elevated last time CBC was collected at 15.6.  She reports chronic elevation without any history of evaluation with hematology. ? ?Past Medical History:  ?Diagnosis Date  ? ASTHMA 03/14/2007  ? NO PROBELEM IN 12 YRS  ? Blood dyscrasia   ? VON WILLIBRANDE "FREE BLEEDER"  ? 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 DEGENERATION, MACULAR NOS 03/14/2007  ? DEPRESSION 03/14/2007  ? DIABETES MELLITUS, TYPE II 03/14/2007  ? Diarrhea 05/31/2007  ? [place] DISEASE, LUMBAR 05/25/2009  ? DIVERTICULOSIS OF COLON 01/11/2009  ? HYPERLIPIDEMIA 06/21/2010  ? HYPERTENSION 03/14/2007  ? Lung abnormality   ? LT LUNG WITH SPOT HAS BEEN FOLLOWED X 3 YRS DR Arlyce Dice  ? SEBORRHEIC KERATOSIS, INFLAMED 12/02/2010  ? Unspecified hearing loss 01/13/2010  ? VERTIGO 11/10/2010  ? ? ? ? ?Family History  ?Problem Relation Age of Onset  ? Cancer Mother   ? Diabetes Mother   ? Heart disease Mother   ? Hyperlipidemia Mother   ? Hypertension Mother   ? Hyperlipidemia Father   ? Hypertension Father   ? Colon cancer Maternal Uncle 87  ? Colon cancer Maternal Uncle 69  ? Heart disease Other   ?  Diabetes Other   ?     diabeties  ? Hyperlipidemia Other   ? Hypertension Other   ? Colon cancer Maternal Aunt 85  ? ? ?Social History  ? ?Social History Narrative  ? Not on file  ? ?Social History  ? ?Tobacco Use  ? Smoking status: Former  ?  Packs/day: 2.00  ?  Years: 37.00  ?  Pack years: 74.00  ?  Types: Cigarettes  ?  Quit date: 09/19/1999  ?  Years since quitting: 22.4  ? Smokeless tobacco: Never  ?Substance Use Topics  ? Alcohol use: Yes  ?  Comment: rare  ? ? ? ?Current Meds  ?Medication Sig  ? Accu-Chek FastClix Lancets MISC USE TO TEST BLOOD SUGAR ONCE A DAY  ? ACCU-CHEK SMARTVIEW test strip TEST BLOOD SUGAR ONCE DAILY  ? acetaminophen (TYLENOL) 325 MG tablet Take 650 mg by mouth every 6 (six) hours as needed for moderate pain.  ? albuterol (PROAIR HFA) 108 (90 Base) MCG/ACT inhaler Inhale 2 puffs into the lungs every 6  hours as needed for wheezing or shortness of breath.  ? atorvastatin (LIPITOR) 40 MG tablet TAKE 1 TABLET BY MOUTH ONCE A DAY  ? blood glucose meter kit and supplies KIT Test  once daily for glucose control.  Dx E11.9  ? Blood Glucose Monitoring Suppl (ACCU-CHEK GUIDE) w/Device KIT 1 Device by Does not apply route daily.  ? citalopram (CELEXA) 20 MG tablet Take 1 tablet (20 mg) by mouth daily.  ? docusate sodium (COLACE) 100 MG capsule Take 100 mg by mouth daily as needed. Reported on 10/21/2015  ? fluticasone (FLONASE) 50 MCG/ACT nasal spray 1 spray up each nostril  at bedtime  ? metFORMIN (GLUCOPHAGE) 500 MG tablet TAKE 1 TABLET BY MOUTH ONCE A DAY WITH BREAKFAST  ? Multiple Vitamins-Minerals (OCUVITE PO) Take 1 tablet by mouth daily.   ? ? ?ROS:  ?Review of Systems  ?Constitutional:  Negative for fever, malaise/fatigue and weight loss.  ?Respiratory:  Negative for shortness of breath.   ?Cardiovascular:  Negative for chest pain and leg swelling.  ?Gastrointestinal:  Negative for blood in stool.  ?Neurological:  Negative for dizziness and headaches.  ? ? ?Objective:  ? ?Today's Vitals: BP  120/64   Pulse 96   Temp 97.8 ?F (36.6 ?C)   Ht 5' 7.5" (1.715 m)   Wt 185 lb 6 oz (84.1 kg)   SpO2 97%   BMI 28.61 kg/m?  ? ?  02/09/2022  ?  3:45 PM 06/20/2021  ?  2:12 PM 09/29/2020  ?  2:01 PM  ?Vitals with BMI  ?Height 5' 7.5" 5' 7.5" 5' 7.5"  ?Weight 185 lbs 6 oz 189 lbs 6 oz 202 lbs  ?BMI 28.59 29.21 31.15  ?Systolic 245 809 983  ?Diastolic 64 73 70  ?Pulse 96 101 95  ?  ? ?Physical Exam ?Vitals reviewed.  ?Constitutional:   ?   General: She is not in acute distress. ?   Appearance: Normal appearance.  ?HENT:  ?   Head: Normocephalic and atraumatic.  ?Neck:  ?   Vascular: No carotid bruit.  ?Cardiovascular:  ?   Rate and Rhythm: Normal rate and regular rhythm.  ?   Pulses: Normal pulses.  ?   Heart sounds: Normal heart sounds.  ?Pulmonary:  ?   Effort: Pulmonary effort is normal.  ?   Breath sounds: Normal breath sounds.  ?Skin: ?   General: Skin is warm and dry.  ?Neurological:  ?   General: No focal deficit present.  ?   Mental Status: She is alert and oriented to person, place, and time.  ?Psychiatric:     ?   Mood and Affect: Mood normal.     ?   Behavior: Behavior normal.     ?   Judgment: Judgment normal.  ? ? ? ? ? ? ? ?Assessment and Plan  ? ?1. Type 2 diabetes mellitus with hyperglycemia, without long-term current use of insulin ([place])   ?2. Elevated hemoglobin ([place])   ?3. HTN associated with DM ([place])   ? ? ? ?Plan: ?See plan via problem list below. ? ? ?Tests ordered ?Orders Placed This Encounter  ?Procedures  ? Basic Metabolic Panel (BMET)  ? CBC  ? HgB A1c  ? ? ? ? ?Meds ordered this encounter  ?Medications  ? losartan-hydrochlorothiazide (HYZAAR) 50-12.5 MG tablet  ?  Sig: Take 0.5 tablets by mouth daily.  ?  Dispense:  90 tablet  ?  Refill:  3  ?  Order Specific Question:   Supervising Provider  ?  Answer:   Binnie Rail [[id number]]  ? ? ?Patient to follow-up in 1 month for annual physical exam, or sooner as needed. ? ?Ailene Ards,  NP ? ?

## 2022-02-09 NOTE — Assessment & Plan Note (Signed)
Chronic, we will recheck CBC today.  May consider referral to hematology in the future if needed. ?

## 2022-02-09 NOTE — Assessment & Plan Note (Signed)
Chronic, A1c ordered today.  Continue metformin 500 mg by mouth daily.  Continue on ARB.  Continue atorvastatin 40 mg by mouth daily.  Consider checking urine for microalbuminuria next office visit.  Further recommendations may be made based upon lab work results. ?

## 2022-02-09 NOTE — Assessment & Plan Note (Signed)
Chronic, stable blood pressure well controlled.  Continue losartan-hydrochlorothiazide 50-12.5 mg tablets, she takes 1/2 tablet by mouth daily. ?

## 2022-03-16 ENCOUNTER — Other Ambulatory Visit (HOSPITAL_COMMUNITY): Payer: Self-pay

## 2022-03-16 ENCOUNTER — Ambulatory Visit (INDEPENDENT_AMBULATORY_CARE_PROVIDER_SITE_OTHER): Payer: Medicare Other | Admitting: Nurse Practitioner

## 2022-03-16 ENCOUNTER — Other Ambulatory Visit: Payer: Self-pay | Admitting: Nurse Practitioner

## 2022-03-16 ENCOUNTER — Telehealth: Payer: Self-pay | Admitting: Nurse Practitioner

## 2022-03-16 VITALS — BP 156/94 | HR 93 | Temp 98.2°F | Ht 67.5 in | Wt 183.0 lb

## 2022-03-16 DIAGNOSIS — I10 Essential (primary) hypertension: Secondary | ICD-10-CM | POA: Diagnosis not present

## 2022-03-16 DIAGNOSIS — J301 Allergic rhinitis due to pollen: Secondary | ICD-10-CM | POA: Diagnosis not present

## 2022-03-16 DIAGNOSIS — E119 Type 2 diabetes mellitus without complications: Secondary | ICD-10-CM

## 2022-03-16 DIAGNOSIS — N182 Chronic kidney disease, stage 2 (mild): Secondary | ICD-10-CM

## 2022-03-16 DIAGNOSIS — E1159 Type 2 diabetes mellitus with other circulatory complications: Secondary | ICD-10-CM

## 2022-03-16 DIAGNOSIS — G4700 Insomnia, unspecified: Secondary | ICD-10-CM

## 2022-03-16 MED ORDER — IPRATROPIUM BROMIDE 0.03 % NA SOLN
2.0000 | Freq: Two times a day (BID) | NASAL | 12 refills | Status: DC
  Filled 2022-03-16: qty 30, 75d supply, fill #0

## 2022-03-16 MED ORDER — ZOLPIDEM TARTRATE 5 MG PO TABS
5.0000 mg | ORAL_TABLET | Freq: Every evening | ORAL | 0 refills | Status: DC | PRN
  Filled 2022-03-16: qty 15, 15d supply, fill #0

## 2022-03-16 NOTE — Telephone Encounter (Signed)
Please call this patient and let her know that I did refill her Ambien.  However, I reduced the tablet strength to 5 mg/tab.  I recommend she take 1 tablet by mouth daily as needed for sleep.  Please let me know if she has any questions. ?

## 2022-03-16 NOTE — Assessment & Plan Note (Signed)
Chronic, stable with A1c at goal at 6.6.  She will continue taking metformin 500 mg by mouth daily.  Continue ARB and statin.  Recommended that she have urine checked for albuminuria today, however she tells me she cannot urinate.  Consider doing this at subsequent visit. ?

## 2022-03-16 NOTE — Assessment & Plan Note (Signed)
Chronic, blood pressure above goal today.  For now she will continue her losartan-hydrochlorothiazide 50-12.5 mg tablets, she takes 1/2 tablet by mouth daily.  She was told to get an at home automated blood pressure cuff, check her blood pressure daily, and keep a log of this.  We will follow-up in 2 weeks to see how her blood pressure is doing, if still above goal we will consider increasing her dose of antihypertensive.  She reports understanding. ?

## 2022-03-16 NOTE — Assessment & Plan Note (Signed)
Chronic, stable.  We discussed controlling risk factors such as hypertension and diabetes aimed at maintaining renal function.  Recommended checking for albuminuria, however as stated above patient unable to urinate today.  Consider doing this at subsequent visit. ?

## 2022-03-16 NOTE — Assessment & Plan Note (Addendum)
Chronic, recommend she continue using her Zyrtec and Flonase nasal spray as prescribed.  We will also prescribe ipratropium bromide nasal spray that she can use twice a day. ? ?

## 2022-03-16 NOTE — Progress Notes (Signed)
? ? ? ?Subjective:  ?Patient ID: Lynn [name], female    DOB: Mar 08, 1946  Age: 76 y.o. MRN: [id number] ? ?CC:  ?Chief Complaint  ?Patient presents with  ? well visit  ? Hypertension  ? Diabetes  ? Chronic Kidney Disease  ?  ? ? ?HPI  ?This patient arrives today for the above. ? ?Hypertension: She continues on losartan hydrochlorothiazide.  She is tolerating medication well. ? ?Diabetes: Last A1c was 6.6.  She continues on statin and ARB.  Due for diabetic foot exam today.  She is also on metformin 500 mg daily.  She tolerated this medication well. ? ?Chronic kidney disease: Last EGFR was 68.83, approximately 4 months prior GFR was 61.10.  She has CKD stage II.  No albuminuria level on file.  Patient continues on ARB. ? ?Allergic rhinitis: She reports that she has seasonal allergies.  She uses Flonase as needed as well as Zyrtec. ? ?Past Medical History:  ?Diagnosis Date  ? ASTHMA 03/14/2007  ? NO PROBELEM IN 12 YRS  ? Blood dyscrasia   ? VON WILLIBRANDE "FREE BLEEDER"  ? 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 DEGENERATION, MACULAR NOS 03/14/2007  ? DEPRESSION 03/14/2007  ? DIABETES MELLITUS, TYPE II 03/14/2007  ? Diarrhea 05/31/2007  ? [place] DISEASE, LUMBAR 05/25/2009  ? DIVERTICULOSIS OF COLON 01/11/2009  ? HYPERLIPIDEMIA 06/21/2010  ? HYPERTENSION 03/14/2007  ? Lung abnormality   ? LT LUNG WITH SPOT HAS BEEN FOLLOWED X 3 YRS DR Arlyce Dice  ? SEBORRHEIC KERATOSIS, INFLAMED 12/02/2010  ? Unspecified hearing loss 01/13/2010  ? VERTIGO 11/10/2010  ? ? ? ? ?Family History  ?Problem Relation Age of Onset  ? Cancer Mother   ? Diabetes Mother   ? Heart disease Mother   ? Hyperlipidemia Mother   ? Hypertension Mother   ? Hyperlipidemia Father   ? Hypertension Father   ? Colon cancer Maternal Uncle 39  ? Colon cancer Maternal Uncle 48  ? Heart disease Other   ? Diabetes Other   ?     diabeties  ? Hyperlipidemia Other   ? Hypertension Other   ? Colon cancer Maternal Aunt 36  ? ? ?Social History  ? ?Social History Narrative  ? Not on  file  ? ?Social History  ? ?Tobacco Use  ? Smoking status: Former  ?  Packs/day: 2.00  ?  Years: 37.00  ?  Pack years: 74.00  ?  Types: Cigarettes  ?  Quit date: 09/19/1999  ?  Years since quitting: 22.5  ? Smokeless tobacco: Never  ?Substance Use Topics  ? Alcohol use: Yes  ?  Comment: rare  ? ? ? ?Current Meds  ?Medication Sig  ? Accu-Chek FastClix Lancets MISC USE TO TEST BLOOD SUGAR ONCE A DAY  ? ACCU-CHEK SMARTVIEW test strip TEST BLOOD SUGAR ONCE DAILY  ? acetaminophen (TYLENOL) 325 MG tablet Take 650 mg by mouth every 6 (six) hours as needed for moderate pain.  ? albuterol (PROAIR HFA) 108 (90 Base) MCG/ACT inhaler Inhale 2 puffs into the lungs every 6  hours as needed for wheezing or shortness of breath.  ? atorvastatin (LIPITOR) 40 MG tablet TAKE 1 TABLET BY MOUTH ONCE A DAY  ? blood glucose meter kit and supplies KIT Test once daily for glucose control.  Dx E11.9  ? Blood Glucose Monitoring Suppl (ACCU-CHEK GUIDE) w/Device KIT 1 Device by Does not apply route daily.  ? cetirizine (ZYRTEC) 5 MG tablet Take 5 mg by mouth in  the morning and at bedtime.  ? citalopram (CELEXA) 20 MG tablet Take 1 tablet (20 mg) by mouth daily.  ? docusate sodium (COLACE) 100 MG capsule Take 100 mg by mouth daily as needed. Reported on 10/21/2015  ? fluticasone (FLONASE) 50 MCG/ACT nasal spray 1 spray up each nostril  at bedtime  ? ipratropium (ATROVENT) 0.03 % nasal spray Place 2 sprays into both nostrils every 12 (twelve) hours.  ? losartan-hydrochlorothiazide (HYZAAR) 50-12.5 MG tablet Take 0.5 tablets by mouth daily.  ? metFORMIN (GLUCOPHAGE) 500 MG tablet TAKE 1 TABLET BY MOUTH ONCE A DAY WITH BREAKFAST  ? Multiple Vitamins-Minerals (OCUVITE PO) Take 1 tablet by mouth daily.   ? ? ?ROS:  ?Review of Systems  ?Respiratory:  Negative for shortness of breath.   ?Cardiovascular:  Negative for chest pain.  ?Neurological:  Negative for headaches.  ? ? ?Objective:  ? ?Today's Vitals: BP (!) 156/94 (BP Location: Right Arm, Patient  Position: Sitting, Cuff Size: Large)   Pulse 93   Temp 98.2 ?F (36.8 ?C) (Oral)   Ht 5' 7.5" (1.715 m)   Wt 183 lb (83 kg)   SpO2 94%   BMI 28.24 kg/m?  ? ?  03/16/2022  ?  2:58 PM 02/09/2022  ?  3:45 PM 06/20/2021  ?  2:12 PM  ?Vitals with BMI  ?Height 5' 7.5" 5' 7.5" 5' 7.5"  ?Weight 183 lbs 185 lbs 6 oz 189 lbs 6 oz  ?BMI 28.22 28.59 29.21  ?Systolic 502 774 128  ?Diastolic 94 64 73  ?Pulse 93 96 101  ?  ? ?Physical Exam ?Vitals reviewed.  ?Constitutional:   ?   General: She is not in acute distress. ?   Appearance: Normal appearance.  ?HENT:  ?   Head: Normocephalic and atraumatic.  ?Neck:  ?   Vascular: No carotid bruit.  ?Cardiovascular:  ?   Rate and Rhythm: Normal rate and regular rhythm.  ?   Pulses: Normal pulses.     ?     Dorsalis pedis pulses are 2+ on the right side and 2+ on the left side.  ?   Heart sounds: Normal heart sounds.  ?Pulmonary:  ?   Effort: Pulmonary effort is normal.  ?   Breath sounds: Normal breath sounds.  ?Musculoskeletal:  ?   Right foot: No deformity.  ?   Left foot: No deformity.  ?Feet:  ?   Right foot:  ?   Protective Sensation: 10 sites tested.  10 sites sensed.  ?   Skin integrity: Skin integrity normal.  ?   Toenail Condition: Right toenails are normal.  ?   Left foot:  ?   Protective Sensation: 10 sites tested.  10 sites sensed.  ?   Skin integrity: Skin integrity normal.  ?   Toenail Condition: Left toenails are normal.  ?Skin: ?   General: Skin is warm and dry.  ?Neurological:  ?   General: No focal deficit present.  ?   Mental Status: She is alert and oriented to person, place, and time.  ?Psychiatric:     ?   Mood and Affect: Mood normal.     ?   Behavior: Behavior normal.     ?   Judgment: Judgment normal.  ? ? ? ? ? ? ? ?Assessment and Plan  ? ?1. Type 2 diabetes mellitus without complication, without long-term current use of insulin ([place])   ?2. Allergic rhinitis due to pollen, unspecified seasonality   ?3. CKD (  chronic kidney disease) stage 2, GFR 60-89 ml/min    ?4. HTN associated with DM ([place])   ? ? ? ?Plan: ?See plan via problem list below. ? ? ?Tests ordered ?No orders of the defined types were placed in this encounter. ? ? ? ? ?Meds ordered this encounter  ?Medications  ? ipratropium (ATROVENT) 0.03 % nasal spray  ?  Sig: Place 2 sprays into both nostrils every 12 (twelve) hours.  ?  Dispense:  30 mL  ?  Refill:  12  ?  Order Specific Question:   Supervising Provider  ?  Answer:   Binnie Rail [[id number]]  ? ? ?Patient to follow-up in 2 weeks for close monitoring of her high blood pressure.  ? ?Ailene Ards, NP ? ?

## 2022-03-24 ENCOUNTER — Other Ambulatory Visit (HOSPITAL_COMMUNITY): Payer: Self-pay

## 2022-03-30 ENCOUNTER — Encounter: Payer: Self-pay | Admitting: Nurse Practitioner

## 2022-03-30 ENCOUNTER — Telehealth (INDEPENDENT_AMBULATORY_CARE_PROVIDER_SITE_OTHER): Payer: Medicare Other | Admitting: Nurse Practitioner

## 2022-03-30 VITALS — BP 140/76 | HR 82

## 2022-03-30 DIAGNOSIS — I152 Hypertension secondary to endocrine disorders: Secondary | ICD-10-CM

## 2022-03-30 DIAGNOSIS — E1159 Type 2 diabetes mellitus with other circulatory complications: Secondary | ICD-10-CM

## 2022-03-30 NOTE — Progress Notes (Signed)
Established Patient Office Visit  An audio-only tele-health visit was completed today for this patient. I connected with  Lynn [name] on 03/30/22 utilizing audio-only technology and verified that I am speaking with the correct person using two identifiers. The patient was located at their home, and I was located at the office of [place] at [facility] during the encounter. I discussed the limitations of evaluation and management by telemedicine. The patient expressed understanding and agreed to proceed.     Subjective   Patient ID: Lynn [name], female    DOB: 07/23/1946  Age: 76 y.o. MRN: [id number]  Chief Complaint  Patient presents with   Hypertension    At last office visit patient's blood pressure was above goal.  She reports that she has whitecoat hypertension, but did not have any have a home blood pressure readings for me available at that visit.  Since then she has obtained an at home blood pressure cuff and has readings for me.  They are as listed below.  She continues to take and tolerate losartan-hydrochlorothiazide 50-12.5 mg/tab., and she takes 1/2 tablet by mouth daily.  03/25/22: 136/70, 70 03/26/22: 138/84, 73 03/27/22: 122/76, 72 03/28/22: 139/81, 71 03/29/22: 120/70, 64 03/30/22: 140/70, 82  Past Medical History:  Diagnosis Date   ASTHMA 03/14/2007   NO PROBELEM IN 12 YRS   Blood dyscrasia    VON WILLIBRANDE "FREE BLEEDER"   COLONIC POLYPS, ADENOMATOUS, 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   DEGENERATION, MACULAR NOS 03/14/2007   DEPRESSION 03/14/2007   DIABETES MELLITUS, TYPE II 03/14/2007   Diarrhea 05/31/2007   DISC DISEASE, LUMBAR 05/25/2009   DIVERTICULOSIS OF COLON 01/11/2009   HYPERLIPIDEMIA 06/21/2010   HYPERTENSION 03/14/2007   Lung abnormality    LT LUNG WITH SPOT HAS BEEN FOLLOWED X 3 YRS DR BURNEY   SEBORRHEIC KERATOSIS, INFLAMED 12/02/2010   Unspecified hearing loss 01/13/2010   VERTIGO 11/10/2010      Review of Systems  Constitutional:  Negative for  malaise/fatigue.  Eyes:  Negative for blurred vision.  Respiratory:  Negative for shortness of breath.   Cardiovascular:  Negative for chest pain.  Neurological:  Negative for headaches.     Objective:     BP 140/76   Pulse 82  BP Readings from Last 3 Encounters:  03/30/22 140/76  03/16/22 (!) 156/94  02/09/22 120/64   Wt Readings from Last 3 Encounters:  03/16/22 183 lb (83 kg)  02/09/22 185 lb 6 oz (84.1 kg)  06/20/21 189 lb 6.4 oz (85.9 kg)      Physical Exam Comprehensive physical exam not completed today as office visit was conducted remotely.  Patient sounded well over the phone.  Patient was alert and oriented, and appeared to have appropriate judgment.   No results found for any visits on 03/30/22.  Last metabolic panel Lab Results  Component Value Date   GLUCOSE 129 (H) 02/09/2022   NA 140 02/09/2022   K 3.8 02/09/2022   CL 101 02/09/2022   CO2 32 02/09/2022   BUN 12 02/09/2022   CREATININE 0.83 02/09/2022   GFRNONAA 70 (L) 12/08/2014   CALCIUM 10.5 02/09/2022   PROT 6.6 06/20/2021   ALBUMIN 4.3 06/20/2021   BILITOT 1.2 06/20/2021   ALKPHOS 99 06/20/2021   AST 20 06/20/2021   ALT 12 06/20/2021   ANIONGAP 9 12/08/2014      The 10-year ASCVD risk score (Arnett DK, et al., 2019) is: 42.1%    Assessment & Plan:   Problem  List Items Addressed This Visit       Cardiovascular and Mediastinum   HTN associated with DM ([place]) - Primary (Chronic)    Chronic, stable.  Blood pressure much better at home compared to last office visit.  For now she will continue on her losartan-hydrochlorothiazide 50-12.5 mg/tab. and she will take 1/2 tablet by mouth daily.        Return in about 6 months (around 09/30/2022).   Total time spent with telephone today was 5 minutes and 49 seconds.   Ailene Ards, NP

## 2022-03-30 NOTE — Assessment & Plan Note (Signed)
Chronic, stable.  Blood pressure much better at home compared to last office visit.  For now she will continue on her losartan-hydrochlorothiazide 50-12.5 mg/tab. and she will take 1/2 tablet by mouth daily.

## 2022-03-31 ENCOUNTER — Other Ambulatory Visit (HOSPITAL_COMMUNITY): Payer: Self-pay

## 2022-04-13 ENCOUNTER — Other Ambulatory Visit (HOSPITAL_COMMUNITY): Payer: Self-pay

## 2022-04-20 ENCOUNTER — Other Ambulatory Visit (HOSPITAL_COMMUNITY): Payer: Self-pay

## 2022-05-17 ENCOUNTER — Other Ambulatory Visit (HOSPITAL_COMMUNITY): Payer: Self-pay

## 2022-08-04 ENCOUNTER — Ambulatory Visit (INDEPENDENT_AMBULATORY_CARE_PROVIDER_SITE_OTHER): Payer: Medicare Other

## 2022-08-04 DIAGNOSIS — Z0000 Encounter for general adult medical examination without abnormal findings: Secondary | ICD-10-CM

## 2022-08-04 NOTE — Progress Notes (Unsigned)
Subjective:   Lynn [name] is a 76 y.o. female who presents for Medicare Annual (Subsequent) preventive examination.  I connected with [name] today by telephone and verified that I am speaking with the correct person using two identifiers. I discussed the limitations, risks, security and privacy concerns of performing an evaluation and management service by telephone and the availability of in person appointments. I also discussed with the patient that there may be a patient responsible charge related to this service. The patient expressed understanding and agreed to proceed. Location patient: home Location provider: Pietro Cassis Persons participating in the visit: [name] and [name], [place].  Time Spent with patient on telephone encounter: 16 mins  Review of Systems    No ROS. Medicare Wellness Telephone Visit. Additional risk factors are reflected in social history. Cardiac Risk Factors include: advanced age (>30men, >54 women);diabetes mellitus;dyslipidemia;hypertension;sedentary lifestyle     Objective:    There were no vitals filed for this visit. There is no height or weight on file to calculate BMI.     08/07/2022    9:07 AM 06/15/2015   12:36 PM 12/08/2014    5:39 AM 04/08/2014    1:05 PM 04/06/2014   10:48 AM 11/30/2011   10:00 PM 11/23/2011    2:46 PM  Advanced Directives  Does Patient Have a Medical Advance Directive? Yes No No Patient has advance directive, copy not in chart Patient has advance directive, copy not in chart Patient has advance directive, copy in chart Patient has advance directive, copy not in chart  Type of Advance Directive [place];Living will   Living will;Healthcare Power of [place];Living will  Does patient want to make changes to medical advance directive? No - Patient declined        Copy of [place] in Chart? No - copy requested         Would patient like information on creating a medical advance directive?  No - patient declined information No - patient declined information      Pre-existing out of facility DNR order (yellow form or pink MOST form)      No     Current Medications (verified) Outpatient Encounter Medications as of 08/04/2022  Medication Sig   Accu-Chek FastClix Lancets MISC USE TO TEST BLOOD SUGAR ONCE A DAY   ACCU-CHEK SMARTVIEW test strip TEST BLOOD SUGAR ONCE DAILY   acetaminophen (TYLENOL) 325 MG tablet Take 650 mg by mouth every 6 (six) hours as needed for moderate pain.   albuterol (PROAIR HFA) 108 (90 Base) MCG/ACT inhaler Inhale 2 puffs into the lungs every 6  hours as needed for wheezing or shortness of breath.   atorvastatin (LIPITOR) 40 MG tablet TAKE 1 TABLET BY MOUTH ONCE A DAY   blood glucose meter kit and supplies KIT Test once daily for glucose control.  Dx E11.9   Blood Glucose Monitoring Suppl (ACCU-CHEK GUIDE) w/Device KIT 1 Device by Does not apply route daily.   cetirizine (ZYRTEC) 5 MG tablet Take 5 mg by mouth in the morning and at bedtime.   citalopram (CELEXA) 20 MG tablet Take 1 tablet (20 mg) by mouth daily.   docusate sodium (COLACE) 100 MG capsule Take 100 mg by mouth daily as needed. Reported on 10/21/2015   fluticasone (FLONASE) 50 MCG/ACT nasal spray 1 spray up each nostril  at bedtime   ipratropium (ATROVENT) 0.03 % nasal spray Place 2 sprays  into both nostrils every 12 (twelve) hours.   losartan-hydrochlorothiazide (HYZAAR) 50-12.5 MG tablet Take 0.5 tablets by mouth daily.   metFORMIN (GLUCOPHAGE) 500 MG tablet TAKE 1 TABLET BY MOUTH ONCE A DAY WITH BREAKFAST   Multiple Vitamins-Minerals (OCUVITE PO) Take 1 tablet by mouth daily.    zolpidem (AMBIEN) 5 MG tablet Take 1 tablet by mouth at bedtime as needed for sleep.   fluticasone (FLOVENT HFA) 110 MCG/ACT inhaler INHALE 1 PUFF BY MOUTH INTO THE LUNGS TWICE DAILY   No facility-administered encounter medications on file  as of 08/04/2022.    Allergies (verified) Morphine and related   History: Past Medical History:  Diagnosis Date   ASTHMA 03/14/2007   NO PROBELEM IN 12 YRS   Blood dyscrasia    VON [place] "FREE BLEEDER"   COLONIC POLYPS, ADENOMATOUS, 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   DEGENERATION, MACULAR NOS 03/14/2007   DEPRESSION 03/14/2007   DIABETES MELLITUS, TYPE II 03/14/2007   Diarrhea 05/31/2007   [place] DISEASE, LUMBAR 05/25/2009   DIVERTICULOSIS OF COLON 01/11/2009   HYPERLIPIDEMIA 06/21/2010   HYPERTENSION 03/14/2007   Lung abnormality    LT LUNG WITH SPOT HAS BEEN FOLLOWED X 3 YRS DR [place], INFLAMED 12/02/2010   Unspecified hearing loss 01/13/2010   VERTIGO 11/10/2010   Past Surgical History:  Procedure Laterality Date   CHOLECYSTECTOMY  1982   urosespsis post op   [place] RT KNEE  +2009 REMOVED, REPLACED JOINT   KNEE ARTHROSCOPY Right 1995   right, TNR   left shoulder Left 2012   rotator cuff   SPINE SURGERY  1996   fusion, ruptured disk   TONSILLECTOMY  1952   Family History  Problem Relation Age of Onset   Cancer Mother    Diabetes Mother    Heart disease Mother    Hyperlipidemia Mother    Hypertension Mother    Hyperlipidemia Father    Hypertension Father    Colon cancer Maternal Uncle 82   Colon cancer Maternal Uncle 67   Heart disease Other    Diabetes Other        diabeties   Hyperlipidemia Other    Hypertension Other    Colon cancer Maternal Aunt 70   Social History   Socioeconomic History   Marital status: Single    Spouse name: Not on file   Number of children: Not on file   Years of education: Not on file   Highest education level: Not on file  Occupational History   Not on file  Tobacco Use   Smoking status: Former    Packs/day: 2.00    Years: 37.00    Total pack years: 74.00    Types: Cigarettes    Quit date: 09/19/1999    Years since quitting: 22.8   Smokeless tobacco: Never  Vaping Use   Vaping Use: Never used   Substance and Sexual Activity   Alcohol use: Yes    Comment: rare   Drug use: No   Sexual activity: Not on file  Other Topics Concern   Not on file  Social History Narrative   Not on file   Social Determinants of Health   Financial Resource Strain: Low Risk  (08/07/2022)   Overall Financial Resource Strain (CARDIA)    Difficulty of Paying Living Expenses: Not hard at all  Food Insecurity: No Food Insecurity (08/07/2022)   Hunger Vital Sign    Worried About Running Out of Food in the  Last Year: Never true    [place] in the Last Year: Never true  Transportation Needs: No Transportation Needs (08/07/2022)   PRAPARE - Hydrologist (Medical): No    Lack of Transportation (Non-Medical): No  Physical Activity: Inactive (08/07/2022)   Exercise Vital Sign    Days of Exercise per Week: 0 days    Minutes of Exercise per Session: 0 min  Stress: No Stress Concern Present (08/07/2022)   [place]    Feeling of Stress : Not at all  Social Connections: Unknown (08/07/2022)   Social Connection and Isolation Panel [NHANES]    Frequency of Communication with Friends and Family: More than three times a week    Frequency of Social Gatherings with Friends and Family: Once a week    Attends Religious Services: Never    Marine scientist or Organizations: No    Attends Music therapist: Never    Marital Status: Patient refused    Tobacco Counseling Counseling given: Not Answered   Clinical Intake:  Pre-visit preparation completed: Yes  Pain : No/denies pain     Nutritional Risks: None Diabetes: Yes CBG done?: No Did pt. bring in CBG monitor from home?: No How often do you monitor your CBG's? 1-2x  Daily Any N/V/D within the last 2 months? No Any non-healing wounds? No  Have you had any unintentional weight loss or weight gain? No   How often do you need to have  someone help you when you read instructions, pamphlets, or other written materials from your doctor or pharmacy?: 1 - Never What is the last grade level you completed in school?: College RN degree, retired Marine scientist for [employer]?: No  Information entered by :: [name], [place] [facility] Daily Living    08/07/2022    9:08 AM  In your present state of health, do you have any difficulty performing the following activities:  Hearing? 0  Vision? 0  Difficulty concentrating or making decisions? 0  Walking or climbing stairs? 0  Dressing or bathing? 0  Doing errands, shopping? 0  Preparing Food and eating ? N  Using the Toilet? N  In the past six months, have you accidently leaked urine? N  Do you have problems with loss of bowel control? N  Managing your Medications? N  Managing your Finances? N  Housekeeping or managing your Housekeeping? N    Patient Care Team: Ailene Ards, NP as PCP - General (Nurse Practitioner)  Indicate any recent Medical Services you may have received from other than [facility] providers in the past year (date may be approximate).     Assessment:   This is a routine wellness examination for [place].  Hearing/Vision screen Patient denied any hearing difficulty. No hearing aids. Patient does wear corrective lenses.  Dietary issues and exercise activities discussed: Current Exercise Habits: The patient does not participate in regular exercise at present, Exercise limited by: None identified   Goals Addressed   None   Patient denied a healthcare goal at this time.   Depression Screen    08/07/2022    9:06 AM 03/16/2022    3:04 PM 02/09/2022    3:48 PM 06/20/2021    3:22 PM 10/21/2015    2:21 PM 06/22/2015    8:51 AM 04/14/2015    8:35 AM  PHQ 2/9 Scores  PHQ - 2 Score  0 1 0 3 0 0 0  PHQ- 9 Score  1  10       Fall Risk    08/07/2022    9:08 AM 03/16/2022    3:04 PM 02/09/2022    3:48 PM 06/20/2021    2:17 PM 01/13/2020     3:02 PM  Fall Risk   Falls in the past year? 0 0 0 0 0  Number falls in past yr: 0   0   Injury with Fall? 0   0   Risk for fall due to : No Fall Risks   No Fall Risks   Follow up Falls evaluation completed        FALL RISK PREVENTION PERTAINING TO THE HOME:  Any stairs in or around the home? No  If so, are there any without handrails?  N/A Home free of loose throw rugs in walkways, pet beds, electrical cords, etc? Yes  Adequate lighting in your home to reduce risk of falls? Yes   ASSISTIVE DEVICES UTILIZED TO PREVENT FALLS:  Life alert? No  Use of a cane, walker or w/c? No  Grab bars in the bathroom? Yes  Shower chair or bench in shower? No  Elevated toilet seat or a handicapped toilet? Yes   TIMED UP AND GO:  Was the test performed? No .  Length of time to ambulate 10 feet: N/A sec.   Patient stated that she has no issues with gait or balance; does not use any assistive devices.  Cognitive Function:  Patient is cogitatively intact.     Immunizations Immunization History  Administered Date(s) Administered   Fluad Quad(high Dose 65+) 08/06/2020   Influenza Split 08/26/2012   Influenza Whole 11/06/2004, 08/11/2009   Influenza, High Dose Seasonal PF 07/28/2015, 07/24/2016, 07/27/2017, 07/26/2018, 08/07/2019   Influenza,inj,Quad PF,6+ Mos 07/29/2013, 07/06/2014   PFIZER(Purple Top)SARS-COV-2 Vaccination 12/11/2019, 01/05/2020, 03/14/2021   Pneumococcal Conjugate-13 04/06/2014   Pneumococcal Polysaccharide-23 11/06/2004, 04/02/2013   Td 11/06/2002, 06/21/2010   Tdap 06/15/2015   Zoster Recombinat (Shingrix) 08/07/2019   Zoster, Live 03/18/2008    TDAP status: Up to date  Flu Vaccine status: Due, Education has been provided regarding the importance of this vaccine. Advised may receive this vaccine at local pharmacy or Health Dept. Aware to provide a copy of the vaccination record if obtained from local pharmacy or Health Dept. Verbalized acceptance and  understanding.  Pneumococcal vaccine status: Up to date  Covid-19 vaccine status: Completed vaccines  Qualifies for Shingles Vaccine? Yes   Zostavax completed No   Shingrix Completed?: No.    Education has been provided regarding the importance of this vaccine. Patient has been advised to call insurance company to determine out of pocket expense if they have not yet received this vaccine. Advised may also receive vaccine at local pharmacy or Health Dept. Verbalized acceptance and understanding.  Screening Tests Health Maintenance  Topic Date Due   FOOT EXAM  07/19/2016   Diabetic kidney evaluation - Urine ACR  04/03/2019   Zoster Vaccines- Shingrix (2 of 2) 10/02/2019   OPHTHALMOLOGY EXAM  12/20/2019   COVID-19 Vaccine (4 - Pfizer series) 08/20/2022 (Originally 05/09/2021)   INFLUENZA VACCINE  09/06/2022 (Originally 06/06/2022)   HEMOGLOBIN A1C  08/11/2022   Diabetic kidney evaluation - GFR measurement  02/10/2023   TETANUS/TDAP  06/14/2025   Pneumonia Vaccine 69+ Years old  Completed   DEXA SCAN  Completed   Hepatitis C Screening  Completed   HPV VACCINES  Aged Out  COLONOSCOPY (Pts 45-76yrs Insurance coverage will need to be confirmed)  Discontinued    Health Maintenance  Health Maintenance Due  Topic Date Due   FOOT EXAM  07/19/2016   Diabetic kidney evaluation - Urine ACR  04/03/2019   Zoster Vaccines- Shingrix (2 of 2) 10/02/2019   OPHTHALMOLOGY EXAM  12/20/2019    Colorectal cancer screening: Type of screening: Colonoscopy. Completed 04/22/2014. Repeat every N/A years  Mammogram status: No longer required due to age.  Bone Density Status: Completed 07/10/2014  Lung Cancer Screening: (Low Dose CT Chest recommended if Age 92-80 years, 30 pack-year currently smoking OR have quit w/in 15years.) does not qualify.   Lung Cancer Screening Referral: N/A  Additional Screening:  Hepatitis C Screening: does qualify; Completed 04/02/2018  Vision Screening: Recommended annual  ophthalmology exams for early detection of glaucoma and other disorders of the eye. Is the patient up to date with their annual eye exam?  No has not been in a couple years and does not have a desire to unless there is an issue.  Who is the provider or what is the name of the office in which the patient attends annual eye exams? Unsure If pt is not established with a provider, would they like to be referred to a provider to establish care? No patient declines.   Dental Screening: Recommended annual dental exams for proper oral hygiene  Community Resource Referral / Chronic Care Management: CRR required this visit?  No   CCM required this visit?  No      Plan:     I have personally reviewed and noted the following in the patient's chart:   Medical and social history Use of alcohol, tobacco or illicit drugs  Current medications and supplements including opioid prescriptions. Patient is not currently taking opioid prescriptions. Functional ability and status Nutritional status Physical activity Advanced directives List of other physicians Hospitalizations, surgeries, and ER visits in previous 12 months Vitals Screenings to include cognitive, depression, and falls Referrals and appointments  In addition, I have reviewed and discussed with patient certain preventive protocols, quality metrics, and best practice recommendations. A written personalized care plan for preventive services as well as general preventive health recommendations were provided to patient.     Rossie Muskrat, CMA   08/07/2022   Nurse Notes:   Patient does not get yearly eye exams, declines scheduling with one at this time.

## 2022-08-07 NOTE — Patient Instructions (Signed)
Please schedule your next Medicare Wellness Visit with your Nurse Health Advisor in 1 year by calling [phone]. 

## 2022-10-06 ENCOUNTER — Ambulatory Visit: Payer: Medicare Other | Admitting: Nurse Practitioner

## 2022-10-19 ENCOUNTER — Other Ambulatory Visit (HOSPITAL_COMMUNITY): Payer: Self-pay

## 2022-10-19 ENCOUNTER — Telehealth: Payer: Self-pay | Admitting: Radiology

## 2022-10-19 ENCOUNTER — Other Ambulatory Visit: Payer: Self-pay | Admitting: Nurse Practitioner

## 2022-10-19 ENCOUNTER — Telehealth: Payer: Self-pay | Admitting: Nurse Practitioner

## 2022-10-19 ENCOUNTER — Ambulatory Visit (INDEPENDENT_AMBULATORY_CARE_PROVIDER_SITE_OTHER): Payer: Medicare Other | Admitting: Nurse Practitioner

## 2022-10-19 VITALS — BP 130/68 | HR 105 | Temp 98.3°F | Ht 67.0 in | Wt 187.2 lb

## 2022-10-19 DIAGNOSIS — E119 Type 2 diabetes mellitus without complications: Secondary | ICD-10-CM

## 2022-10-19 DIAGNOSIS — D751 Secondary polycythemia: Secondary | ICD-10-CM

## 2022-10-19 DIAGNOSIS — G4700 Insomnia, unspecified: Secondary | ICD-10-CM | POA: Diagnosis not present

## 2022-10-19 DIAGNOSIS — N182 Chronic kidney disease, stage 2 (mild): Secondary | ICD-10-CM | POA: Diagnosis not present

## 2022-10-19 DIAGNOSIS — E785 Hyperlipidemia, unspecified: Secondary | ICD-10-CM

## 2022-10-19 DIAGNOSIS — I152 Hypertension secondary to endocrine disorders: Secondary | ICD-10-CM

## 2022-10-19 DIAGNOSIS — E1169 Type 2 diabetes mellitus with other specified complication: Secondary | ICD-10-CM | POA: Diagnosis not present

## 2022-10-19 DIAGNOSIS — E1159 Type 2 diabetes mellitus with other circulatory complications: Secondary | ICD-10-CM | POA: Diagnosis not present

## 2022-10-19 LAB — BASIC METABOLIC PANEL
BUN: 17 mg/dL (ref 6–23)
CO2: 30 mEq/L (ref 19–32)
Calcium: 10.7 mg/dL — ABNORMAL HIGH (ref 8.4–10.5)
Chloride: 100 mEq/L (ref 96–112)
Creatinine, Ser: 1.25 mg/dL — ABNORMAL HIGH (ref 0.40–1.20)
GFR: 41.91 mL/min — ABNORMAL LOW (ref >60.00)
Glucose, Bld: 157 mg/dL — ABNORMAL HIGH (ref 70–99)
Potassium: 4.9 mEq/L (ref 3.5–5.1)
Sodium: 138 mEq/L (ref 135–145)

## 2022-10-19 LAB — LIPID PANEL
Cholesterol: 321 mg/dL — ABNORMAL HIGH (ref 0–200)
HDL: 72.9 mg/dL (ref >39.00)
LDL Cholesterol: 226 mg/dL — ABNORMAL HIGH (ref 0–99)
NonHDL: 248.42
Total CHOL/HDL Ratio: 4
Triglycerides: 110 mg/dL (ref 0.0–149.0)
VLDL: 22 mg/dL (ref 0.0–40.0)

## 2022-10-19 LAB — CBC
HCT: 52.8 % — ABNORMAL HIGH (ref 36.0–46.0)
Hemoglobin: 17.4 g/dL — ABNORMAL HIGH (ref 12.0–15.0)
MCHC: 33 g/dL (ref 30.0–36.0)
MCV: 90.8 fl (ref 78.0–100.0)
Platelets: 205 10*3/uL (ref 150.0–400.0)
RBC: 5.81 Mil/uL — ABNORMAL HIGH (ref 3.87–5.11)
RDW: 13.6 % (ref 11.5–15.5)
WBC: 6.5 10*3/uL (ref 4.0–10.5)

## 2022-10-19 LAB — HEMOGLOBIN A1C: Hgb A1c MFr Bld: 6.7 % — ABNORMAL HIGH (ref 4.6–6.5)

## 2022-10-19 MED ORDER — ZOLPIDEM TARTRATE 5 MG PO TABS
5.0000 mg | ORAL_TABLET | Freq: Every evening | ORAL | 0 refills | Status: DC | PRN
  Filled 2022-10-19: qty 30, 30d supply, fill #0

## 2022-10-19 MED ORDER — ATORVASTATIN CALCIUM 40 MG PO TABS
40.0000 mg | ORAL_TABLET | Freq: Every day | ORAL | 3 refills | Status: DC
  Filled 2022-10-19: qty 90, 90d supply, fill #0
  Filled 2023-04-13: qty 90, 90d supply, fill #1
  Filled 2023-07-19: qty 90, 90d supply, fill #2

## 2022-10-19 NOTE — Assessment & Plan Note (Signed)
Chronic, check A1c today.  Patient told to not urinate prior to coming to office visit at next follow-up so that we can check for albuminuria.  Patient reports understanding.  For now continue on metformin 500 mg daily with breakfast.  Continue on ARB and statin therapy as well.

## 2022-10-19 NOTE — Telephone Encounter (Signed)
When you call the patient regarding their lab results please also verify which blood pressure medication she is taking. I saw losartan-HCTZ and valsartan-HCTZ listed and she should NOT be taking both. Please verify what she is on and what dosage she is on. Thank you.

## 2022-10-19 NOTE — Assessment & Plan Note (Signed)
Chronic, check CBC for stability today.  Further recommendations may be made based upon these results.

## 2022-10-19 NOTE — Telephone Encounter (Signed)
She is aware of results and reports she has not been taking the atorvastatin as she has not had any. I let her know you sent in a refill today and she says she will pick it up tomorrow since she is in for the day. I tried to schedule a lab visit but she was not sure if she would have a ride so she said she will have to call around and call us back to schedule the lab appt

## 2022-10-19 NOTE — Progress Notes (Signed)
Established Patient Office Visit  Subjective   Patient ID: Lynn [name], female    DOB: [birth year]  Age: 76 y.o. MRN: [id number]  Chief Complaint  Patient presents with   Follow-up    F/U   Diabetes   Hypertension/diabetes/hyperlipidemia: She continues on and tolerates atorvastatin 40 mg by mouth daily, losartan-hydrochlorothiazide '25mg'$ -6.'25mg'$  daily, metformin 500 mg daily. Last A1c 6.6, she is due to have this rechecked today.  Last LDL 84 due to have this rechecked today.  Due to have urine checked for albuminuria however she reports she cannot urinate today.  Up-to-date on pneumonia vaccines.  Due for foot exam.  Insomnia: Requesting refill of Ambien 5 mg tablets.  She reports she ran out of this medication and has been taking Benadryl.  Benadryl resulted in dry mouth and she feels unwell when taking it.  Reports having been on Ambien for many years and tolerates it well.  Chronic kidney disease: Stage II, due to have GFR rechecked as well as to have urine checked for albumin.  Unfortunately patient reports unable to urinate today.  Erythrocytosis: Chronic, patient reports she is always had elevated red blood cells.    Review of Systems  Eyes:  Negative for blurred vision.  Respiratory:  Negative for shortness of breath.   Cardiovascular:  Negative for chest pain.  Neurological:  Negative for loss of consciousness.      Objective:     BP 130/68 (BP Location: Left Arm, Patient Position: Sitting, Cuff Size: Normal)   Pulse (!) 105   Temp 98.3 F (36.8 C) (Oral)   Ht '5\' 7"'$  (1.702 m)   Wt 187 lb 3.2 oz (84.9 kg)   SpO2 98%   BMI 29.32 kg/m    Physical Exam Vitals reviewed.  Constitutional:      General: She is not in acute distress.    Appearance: Normal appearance.  HENT:     Head: Normocephalic and atraumatic.  Neck:     Vascular: No carotid bruit.  Cardiovascular:     Rate and Rhythm: Normal rate and regular rhythm.     Pulses: Normal pulses.           Dorsalis pedis pulses are 2+ on the right side and 2+ on the left side.     Heart sounds: Normal heart sounds.  Pulmonary:     Effort: Pulmonary effort is normal.     Breath sounds: Normal breath sounds.  Musculoskeletal:     Right foot: No deformity.     Left foot: No deformity.  Feet:     Right foot:     Protective Sensation: 10 sites tested.  10 sites sensed.     Skin integrity: Skin integrity normal.     Toenail Condition: Right toenails are normal.     Left foot:     Protective Sensation: 10 sites tested.  8 sites sensed.     Skin integrity: Skin integrity normal.     Toenail Condition: Left toenails are normal.  Skin:    General: Skin is warm and dry.  Neurological:     General: No focal deficit present.     Mental Status: She is alert and oriented to person, place, and time.  Psychiatric:        Mood and Affect: Mood normal.        Behavior: Behavior normal.        Judgment: Judgment normal.      No results found for any visits on 10/19/22.  The 10-year ASCVD risk score (Arnett DK, et al., 2019) is: 41.6%    Assessment & Plan:   Problem List Items Addressed This Visit       Cardiovascular and Mediastinum   HTN associated with DM ([place]) - Primary (Chronic)    Chronic, reasonably well-controlled on current medication regimen.  Patient to continue taking losartan-hydrochlorothiazide 25-6.25 mg tablet daily.      Relevant Medications   atorvastatin (LIPITOR) 40 MG tablet   Other Relevant Orders   CBC   Basic metabolic panel     Endocrine   T2DM (type 2 diabetes mellitus) (HCC) (Chronic)    Chronic, check A1c today.  Patient told to not urinate prior to coming to office visit at next follow-up so that we can check for albuminuria.  Patient reports understanding.  For now continue on metformin 500 mg daily with breakfast.  Continue on ARB and statin therapy as well.      Relevant Medications   atorvastatin (LIPITOR) 40 MG tablet   Other Relevant Orders    CBC   Hemoglobin A1c   Dyslipidemia associated with T2DM (HCC) (Chronic)    Chronic, check lipid panel today.  In the meantime patient will continue taking atorvastatin 40 mg by mouth daily.      Relevant Medications   atorvastatin (LIPITOR) 40 MG tablet   Other Relevant Orders   Lipid panel     Genitourinary   CKD (chronic kidney disease) stage 2, GFR 60-89 ml/min    Chronic, check BMP today.  Further recommendations may be made based upon these results.      Relevant Orders   Basic metabolic panel     Other   Insomnia    Chronic, [place] controlled substance database reviewed.  Will send in Ambien 5 mg tablets to pharmacy.  Patient to take 1 tablet by mouth at night as needed for insomnia.      Relevant Medications   zolpidem (AMBIEN) 5 MG tablet   Erythrocytosis    Chronic, check CBC for stability today.  Further recommendations may be made based upon these results.       Return in about 3 months (around 01/18/2023) for F/U with Annina Piotrowski Maugeri.    Ailene Ards, NP

## 2022-10-19 NOTE — Assessment & Plan Note (Signed)
Chronic, [place] controlled substance database reviewed.  Will send in Ambien 5 mg tablets to pharmacy.  Patient to take 1 tablet by mouth at night as needed for insomnia.

## 2022-10-19 NOTE — Assessment & Plan Note (Signed)
Chronic, reasonably well-controlled on current medication regimen.  Patient to continue taking losartan-hydrochlorothiazide 25-6.25 mg tablet daily.

## 2022-10-19 NOTE — Assessment & Plan Note (Signed)
Chronic, check lipid panel today.  In the meantime patient will continue taking atorvastatin 40 mg by mouth daily.

## 2022-10-19 NOTE — Telephone Encounter (Signed)
Patient called back and said that she is only on the Losartan.

## 2022-10-19 NOTE — Assessment & Plan Note (Signed)
Chronic, check BMP today.  Further recommendations may be made based upon these results.

## 2022-10-20 ENCOUNTER — Other Ambulatory Visit: Payer: Self-pay | Admitting: Nurse Practitioner

## 2022-10-20 ENCOUNTER — Other Ambulatory Visit (HOSPITAL_COMMUNITY): Payer: Self-pay

## 2022-10-20 DIAGNOSIS — I152 Hypertension secondary to endocrine disorders: Secondary | ICD-10-CM

## 2022-10-20 MED ORDER — LOSARTAN POTASSIUM 25 MG PO TABS
25.0000 mg | ORAL_TABLET | Freq: Every day | ORAL | 1 refills | Status: DC
  Filled 2022-10-20: qty 90, 90d supply, fill #0
  Filled 2023-04-13: qty 90, 90d supply, fill #1

## 2022-10-20 NOTE — Telephone Encounter (Signed)
LM for pt to return our call.

## 2022-10-20 NOTE — Telephone Encounter (Signed)
Pt called back and I was able to relay instructions and recommendations. Pt was getting very confused with instructions so I advised for her to write it down, which she did. She reports she has nobody to take her anywhere and she will have to figure something out to get her medication but doesn't know when she can come in for a visit. I advised that Judson Roch needs to see her in the next week or 2 which pt reports all her people who take her will be out of town until after [place] so she can't do that. I let her know to monitor BP and if it is >150/>90 to please call the office but it is important to follow up with Judson Roch. Pt expressed understanding.

## 2022-10-20 NOTE — Telephone Encounter (Signed)
Dr. Quay Burow, I am reaching out with a question regarding this patient. I am wondering if I should d/c her losartan-hctz and switch to another antihypertensive such as amlodipine.   This patient is 76 y/o female with hx of T2DM, hypertension, hyperlipidemia, depression, von willebrand disease.    She saw me yesterday for routine f/u yesterday. Overall was feeling good had no acute concerns. Routine blood work found that her crt bumped up from 0.83 in April to 1.25 with GFR drop from 68 to 41. She is on a low dose of losartan-hctz '25mg'$ -6.'25mg'$ /day. I had recommended that she come in for repeat labs after trying to increase water intake in about a week or so. She is not sure when she can come back due to transportation issues. What do you think would be the safest treatment option? Should I continue her on losartan-hctz, should I change her to losartan only, or should I change her to amlodipine? I am not sure what the best option is regarding her kidney function.   Thank you!

## 2023-03-21 DIAGNOSIS — H40033 Anatomical narrow angle, bilateral: Secondary | ICD-10-CM | POA: Diagnosis not present

## 2023-03-21 DIAGNOSIS — H2513 Age-related nuclear cataract, bilateral: Secondary | ICD-10-CM | POA: Diagnosis not present

## 2023-04-12 ENCOUNTER — Ambulatory Visit: Payer: Medicare Other | Admitting: Nurse Practitioner

## 2023-04-12 ENCOUNTER — Telehealth: Payer: Self-pay | Admitting: Nurse Practitioner

## 2023-04-12 NOTE — Telephone Encounter (Signed)
Please let me know that date and time of her lab visit once it is scheduled. Thank you.

## 2023-04-12 NOTE — Telephone Encounter (Signed)
Patient was unable to make it to appointment scheduled for today, has been rescheduled for 05/11/23. Patient would like for a refill of her medications until then. Specified the metformin and blood pressure medicine and anything else that may be refilled.

## 2023-04-13 ENCOUNTER — Other Ambulatory Visit: Payer: Self-pay | Admitting: Family Medicine

## 2023-04-13 ENCOUNTER — Other Ambulatory Visit: Payer: Self-pay

## 2023-04-13 ENCOUNTER — Other Ambulatory Visit (HOSPITAL_COMMUNITY): Payer: Self-pay

## 2023-04-13 ENCOUNTER — Other Ambulatory Visit: Payer: Self-pay | Admitting: Nurse Practitioner

## 2023-04-13 DIAGNOSIS — G4700 Insomnia, unspecified: Secondary | ICD-10-CM

## 2023-04-16 NOTE — Telephone Encounter (Signed)
Maralyn Sago is put of the office until Thursday. Pls advise on refill.Marland KitchenRaechel Chute

## 2023-04-17 NOTE — Telephone Encounter (Signed)
Called pt gave her Maralyn Sago response. Pt states she will call to get a ride and call back to make the lab appt.Marland KitchenRaechel Chute.Marland KitchenRaechel Chute

## 2023-04-19 ENCOUNTER — Other Ambulatory Visit (HOSPITAL_COMMUNITY): Payer: Self-pay

## 2023-04-19 MED ORDER — CITALOPRAM HYDROBROMIDE 20 MG PO TABS
20.0000 mg | ORAL_TABLET | Freq: Every day | ORAL | 0 refills | Status: DC
  Filled 2023-04-19: qty 30, 30d supply, fill #0

## 2023-04-19 NOTE — Telephone Encounter (Signed)
Patient needs follow-up labs before she can be prescribed metformin due to worsening kidney function identified on lab work. Patient needs follow-up before additional refills will be approved.

## 2023-04-20 ENCOUNTER — Other Ambulatory Visit (HOSPITAL_COMMUNITY): Payer: Self-pay

## 2023-04-20 MED ORDER — ZOLPIDEM TARTRATE 5 MG PO TABS
5.0000 mg | ORAL_TABLET | Freq: Every evening | ORAL | 0 refills | Status: DC | PRN
  Filled 2023-04-20: qty 30, 30d supply, fill #0

## 2023-04-23 ENCOUNTER — Other Ambulatory Visit (HOSPITAL_COMMUNITY): Payer: Self-pay

## 2023-04-27 NOTE — Telephone Encounter (Signed)
LVM for pt to make sure she labs done before her next appt. Told pt to call back if she had further questions.

## 2023-05-11 ENCOUNTER — Ambulatory Visit: Payer: Medicare Other | Admitting: Nurse Practitioner

## 2023-06-13 ENCOUNTER — Telehealth: Payer: Self-pay | Admitting: Nurse Practitioner

## 2023-06-13 DIAGNOSIS — G4700 Insomnia, unspecified: Secondary | ICD-10-CM

## 2023-06-13 DIAGNOSIS — I152 Hypertension secondary to endocrine disorders: Secondary | ICD-10-CM

## 2023-06-13 NOTE — Telephone Encounter (Signed)
Prescription Request  06/13/2023  LOV: 10/19/2022  Pt called to  cancel appt  because of the weather and will call back when she figure out her ride. Pt stated she is very sorry to Jiles Prows.   What is the name of the medication or equipment? zolpidem (AMBIEN) 5 MG tablet  metFORMIN (GLUCOPHAGE) 500 MG tablet  losartan (COZAAR) 25 MG tablet  Have you contacted your pharmacy to request a refill? No    [facility] [address] [place] [address] Phone: [phone] Fax: [phone]  Which pharmacy would you like this sent to? Patient notified that their request is being sent to the clinical staff for review and that they should receive a response within 2 business days.   Please advise at Mobile There is no such number on file (mobile).

## 2023-06-14 ENCOUNTER — Other Ambulatory Visit (HOSPITAL_COMMUNITY): Payer: Self-pay

## 2023-06-14 ENCOUNTER — Ambulatory Visit: Payer: Medicare Other | Admitting: Nurse Practitioner

## 2023-06-14 MED ORDER — ZOLPIDEM TARTRATE 5 MG PO TABS
5.0000 mg | ORAL_TABLET | Freq: Every evening | ORAL | 0 refills | Status: DC | PRN
  Filled 2023-06-14 – 2023-07-19 (×2): qty 7, 7d supply, fill #0

## 2023-06-14 MED ORDER — LOSARTAN POTASSIUM 25 MG PO TABS
25.0000 mg | ORAL_TABLET | Freq: Every day | ORAL | 0 refills | Status: DC
  Filled 2023-06-14 – 2023-07-19 (×2): qty 10, 10d supply, fill #0

## 2023-06-14 NOTE — Telephone Encounter (Signed)
Please call patient and let her know that she has missed multiple appointments and has already been told to have her labs drawn before additional refills can be sent. Due to this I will only send a short supply of her ambien and losartan, however I no longer can refill her metformin until her kidney function has been checked via lab work. She needs to come to lab within the next 7-10 days to get these drawn. I have sent a 7-day supply of PRN ambien and a 10-day supply of her losartan for now. No additional refills will be provided until labs are drawn. Once they are drawn and reviewed I can send in additional supply of her medications.

## 2023-06-14 NOTE — Telephone Encounter (Signed)
Called pt to make aware of providers note, called was declined

## 2023-06-21 ENCOUNTER — Encounter (INDEPENDENT_AMBULATORY_CARE_PROVIDER_SITE_OTHER): Payer: Self-pay

## 2023-06-22 ENCOUNTER — Other Ambulatory Visit (HOSPITAL_COMMUNITY): Payer: Self-pay

## 2023-06-28 NOTE — Telephone Encounter (Signed)
Please try to call patient again to relay the original message. If we can not get in touch with her via telephone please send her a letter.

## 2023-06-29 ENCOUNTER — Encounter: Payer: Self-pay | Admitting: Nurse Practitioner

## 2023-06-29 NOTE — Telephone Encounter (Signed)
A letter has been sent out to the pt via mail as we have been unsuccessful in our attempts to reach the pt via telephone at the number we currently have on file.

## 2023-06-29 NOTE — Telephone Encounter (Signed)
Attempted to call pt to inform her of Sarah's instructions. Upon calling pt it was stated vis automatic message that the call was being screened and the pt is NOT excepting calls from our number. It seems as though the pt has blocked our number at this time. Number called was [phone].

## 2023-07-19 ENCOUNTER — Other Ambulatory Visit: Payer: Self-pay | Admitting: Nurse Practitioner

## 2023-07-19 ENCOUNTER — Other Ambulatory Visit (HOSPITAL_COMMUNITY): Payer: Self-pay

## 2023-07-19 ENCOUNTER — Encounter: Payer: Self-pay | Admitting: Nurse Practitioner

## 2023-07-19 ENCOUNTER — Ambulatory Visit (INDEPENDENT_AMBULATORY_CARE_PROVIDER_SITE_OTHER): Payer: Medicare Other | Admitting: Nurse Practitioner

## 2023-07-19 VITALS — BP 126/84 | HR 87 | Temp 97.9°F | Ht 67.0 in | Wt 195.1 lb

## 2023-07-19 DIAGNOSIS — F321 Major depressive disorder, single episode, moderate: Secondary | ICD-10-CM

## 2023-07-19 DIAGNOSIS — G4700 Insomnia, unspecified: Secondary | ICD-10-CM

## 2023-07-19 DIAGNOSIS — R413 Other amnesia: Secondary | ICD-10-CM | POA: Diagnosis not present

## 2023-07-19 DIAGNOSIS — E1169 Type 2 diabetes mellitus with other specified complication: Secondary | ICD-10-CM

## 2023-07-19 DIAGNOSIS — J4520 Mild intermittent asthma, uncomplicated: Secondary | ICD-10-CM

## 2023-07-19 DIAGNOSIS — E785 Hyperlipidemia, unspecified: Secondary | ICD-10-CM

## 2023-07-19 DIAGNOSIS — E1159 Type 2 diabetes mellitus with other circulatory complications: Secondary | ICD-10-CM

## 2023-07-19 LAB — CBC
HCT: 48.1 % — ABNORMAL HIGH (ref 36.0–46.0)
Hemoglobin: 15.6 g/dL — ABNORMAL HIGH (ref 12.0–15.0)
MCHC: 32.5 g/dL (ref 30.0–36.0)
MCV: 91.1 fl (ref 78.0–100.0)
Platelets: 194 10*3/uL (ref 150.0–400.0)
RBC: 5.28 Mil/uL — ABNORMAL HIGH (ref 3.87–5.11)
RDW: 14.4 % (ref 11.5–15.5)
WBC: 6.8 10*3/uL (ref 4.0–10.5)

## 2023-07-19 LAB — LIPID PANEL
Cholesterol: 286 mg/dL — ABNORMAL HIGH (ref 0–200)
HDL: 83.1 mg/dL (ref >39.00)
LDL Cholesterol: 183 mg/dL — ABNORMAL HIGH (ref 0–99)
NonHDL: 203
Total CHOL/HDL Ratio: 3
Triglycerides: 102 mg/dL (ref 0.0–149.0)
VLDL: 20.4 mg/dL (ref 0.0–40.0)

## 2023-07-19 LAB — COMPREHENSIVE METABOLIC PANEL
ALT: 8 U/L (ref 0–35)
AST: 16 U/L (ref 0–37)
Albumin: 4 g/dL (ref 3.5–5.2)
Alkaline Phosphatase: 104 U/L (ref 39–117)
BUN: 25 mg/dL — ABNORMAL HIGH (ref 6–23)
CO2: 24 meq/L (ref 19–32)
Calcium: 9.9 mg/dL (ref 8.4–10.5)
Chloride: 102 meq/L (ref 96–112)
Creatinine, Ser: 0.99 mg/dL (ref 0.40–1.20)
GFR: 55.15 mL/min — ABNORMAL LOW (ref >60.00)
Glucose, Bld: 164 mg/dL — ABNORMAL HIGH (ref 70–99)
Potassium: 4.1 meq/L (ref 3.5–5.1)
Sodium: 136 meq/L (ref 135–145)
Total Bilirubin: 0.9 mg/dL (ref 0.2–1.2)
Total Protein: 6.7 g/dL (ref 6.0–8.3)

## 2023-07-19 LAB — VITAMIN D 25 HYDROXY (VIT D DEFICIENCY, FRACTURES): VITD: 13.89 ng/mL — ABNORMAL LOW (ref 30.00–100.00)

## 2023-07-19 LAB — HEMOGLOBIN A1C: Hgb A1c MFr Bld: 6.3 % (ref 4.6–6.5)

## 2023-07-19 MED ORDER — QVAR REDIHALER 40 MCG/ACT IN AERB
1.0000 | INHALATION_SPRAY | Freq: Two times a day (BID) | RESPIRATORY_TRACT | 2 refills | Status: AC
Start: 2023-07-19 — End: ?
  Filled 2023-07-19 – 2023-09-12 (×2): qty 10.6, 30d supply, fill #0

## 2023-07-19 MED ORDER — CITALOPRAM HYDROBROMIDE 20 MG PO TABS
20.0000 mg | ORAL_TABLET | Freq: Every day | ORAL | 0 refills | Status: DC
  Filled 2023-07-19: qty 90, 90d supply, fill #0

## 2023-07-19 MED ORDER — ATORVASTATIN CALCIUM 40 MG PO TABS
40.0000 mg | ORAL_TABLET | Freq: Every day | ORAL | 0 refills | Status: DC
  Filled 2023-07-19: qty 90, 90d supply, fill #0

## 2023-07-19 MED ORDER — ZOLPIDEM TARTRATE 5 MG PO TABS
5.0000 mg | ORAL_TABLET | Freq: Every evening | ORAL | 0 refills | Status: DC | PRN
  Filled 2023-07-19 – 2023-07-24 (×2): qty 30, 30d supply, fill #0

## 2023-07-19 MED ORDER — ALBUTEROL SULFATE HFA 108 (90 BASE) MCG/ACT IN AERS
2.0000 | INHALATION_SPRAY | Freq: Four times a day (QID) | RESPIRATORY_TRACT | 0 refills | Status: AC | PRN
  Filled 2023-07-19: qty 6.7, 25d supply, fill #0

## 2023-07-19 MED ORDER — LOSARTAN POTASSIUM 25 MG PO TABS
25.0000 mg | ORAL_TABLET | Freq: Every day | ORAL | 0 refills | Status: DC
  Filled 2023-07-19: qty 90, 90d supply, fill #0

## 2023-07-19 MED ORDER — METFORMIN HCL 500 MG PO TABS
500.0000 mg | ORAL_TABLET | Freq: Every day | ORAL | 0 refills | Status: DC
Start: 2023-07-19 — End: 2023-09-12
  Filled 2023-07-19: qty 90, 90d supply, fill #0

## 2023-07-19 MED ORDER — MONTELUKAST SODIUM 10 MG PO TABS
10.0000 mg | ORAL_TABLET | Freq: Every day | ORAL | 0 refills | Status: DC
  Filled 2023-07-19: qty 90, 90d supply, fill #0

## 2023-07-19 NOTE — Addendum Note (Signed)
Addended by: Jiles Prows E on: 07/19/2023 05:09 PM   Modules accepted: Orders

## 2023-07-19 NOTE — Progress Notes (Signed)
Estimated Creatinine Clearance: 54.4 mL/min (by C-G formula based on SCr of 0.99 mg/dL).

## 2023-07-19 NOTE — Assessment & Plan Note (Signed)
Chronic Will get lipid panel today, may consider discontinuing atorvastatin pending results. Check A1c and metabolic panel.  Pending results consider restarting metformin versus another medication option We will consider refilling losartan based on metabolic panel results

## 2023-07-19 NOTE — Assessment & Plan Note (Signed)
Chronic Check additional labs for further evaluation.  Further recommendations may be made based on these results

## 2023-07-19 NOTE — Progress Notes (Signed)
Established Patient Office Visit  Subjective   Patient ID: Lynn [name], female    DOB: June 04, 1946  Age: 77 y.o. MRN: [id number]  Chief Complaint  Patient presents with   Insomnia   Patient comes in today accompanied by her cousin. Insomnia: takes [place] chronically (25 years) per patient for insomnia. Is out of this currently. Has been taking benadryl. Has dry mouth as side effect from benadryl Type 2 diabetes: due for labs, last A1C 6.7, due to have this checked today.  Normally takes metformin 500 mg once a day, currently out of this prescription.  Last GFR 41.  Also on losartan and atorvastatin. Memory: Per cousin patient has had more confusion as well as paranoia and auditory hallucinations.  The cousin reports that patient is often not oriented to date.  She is concerned about possible dementia. Hypercalcemia: Incidental finding, here today for labs for further workup.  Patient does report that she drinks 2 to 3 glasses of milk per da.    Review of Systems  Respiratory:  Negative for shortness of breath.   Cardiovascular:  Negative for chest pain.      Objective:     BP 126/84   Pulse 87   Temp 97.9 F (36.6 C) (Temporal)   Ht 5\' 7"  (1.702 m)   Wt 195 lb 2 oz (88.5 kg)   SpO2 95%   BMI 30.56 kg/m    Physical Exam Vitals reviewed.  Constitutional:      General: She is not in acute distress.    Appearance: Normal appearance.  HENT:     Head: Normocephalic and atraumatic.  Neck:     Vascular: No carotid bruit.  Cardiovascular:     Rate and Rhythm: Normal rate and regular rhythm.     Pulses: Normal pulses.     Heart sounds: Normal heart sounds.  Pulmonary:     Effort: Pulmonary effort is normal.     Breath sounds: Normal breath sounds.  Skin:    General: Skin is warm and dry.  Neurological:     General: No focal deficit present.     Mental Status: She is alert and oriented to person, place, and time.  Psychiatric:        Mood and Affect: Mood  normal.        Behavior: Behavior normal.        Judgment: Judgment normal.      No results found for any visits on 07/19/23.    The ASCVD Risk score (Arnett DK, et al., 2019) failed to calculate for the following reasons:   The valid total cholesterol range is 130 to 320 mg/dL    Assessment & Plan:   Problem List Items Addressed This Visit       Endocrine   Dyslipidemia associated with T2DM (HCC) (Chronic)    Chronic Will get lipid panel today, may consider discontinuing atorvastatin pending results. Check A1c and metabolic panel.  Pending results consider restarting metformin versus another medication option We will consider refilling losartan based on metabolic panel results      Relevant Orders   CBC   PTH, Intact and Calcium   Lipid panel   Hemoglobin A1c   Comprehensive metabolic panel     Other   Insomnia - Primary    Chronic Will consider refilling Ambien pending lab results.      Relevant Orders   CBC   PTH, Intact and Calcium   Lipid panel   Hemoglobin A1c  Comprehensive metabolic panel   Hypercalcemia    Chronic Check additional labs for further evaluation.  Further recommendations may be made based on these results      Relevant Orders   CBC   PTH, Intact and Calcium   Lipid panel   Hemoglobin A1c   Comprehensive metabolic panel   VITAMIN D 25 Hydroxy (Vit-D Deficiency, Fractures)   Memory change    Chronic, per cousin has been getting progressively worse. Will order referral to neurology.  Cousin reports that she is can be added to patient's emergency contact and they are in the process of making her the patient's healthcare power of attorney.  Cousins name is Ms. Foust her phone number is [phone].  She would like to be contacted for referrals as she will be helping to assist with scheduling and transportation.      Relevant Orders   Ambulatory referral to Neurology    Return in about 3 months (around 10/18/2023) for F/U with  Willey Due.    [name], NP

## 2023-07-19 NOTE — Assessment & Plan Note (Signed)
Chronic Will consider refilling Ambien pending lab results.

## 2023-07-19 NOTE — Assessment & Plan Note (Signed)
Chronic, per cousin has been getting progressively worse. Will order referral to neurology.  Cousin reports that she is can be added to patient's emergency contact and they are in the process of making her the patient's healthcare power of attorney.  Cousins name is Ms. Foust her phone number is [phone].  She would like to be contacted for referrals as she will be helping to assist with scheduling and transportation.

## 2023-07-20 ENCOUNTER — Other Ambulatory Visit (HOSPITAL_COMMUNITY): Payer: Self-pay

## 2023-07-20 ENCOUNTER — Other Ambulatory Visit (HOSPITAL_BASED_OUTPATIENT_CLINIC_OR_DEPARTMENT_OTHER): Payer: Self-pay

## 2023-07-20 ENCOUNTER — Other Ambulatory Visit: Payer: Self-pay | Admitting: Nurse Practitioner

## 2023-07-20 DIAGNOSIS — R7989 Other specified abnormal findings of blood chemistry: Secondary | ICD-10-CM

## 2023-07-20 LAB — PTH, INTACT AND CALCIUM
Calcium: 10.2 mg/dL (ref 8.6–10.4)
PTH: 162 pg/mL — ABNORMAL HIGH (ref 16–77)

## 2023-07-23 ENCOUNTER — Other Ambulatory Visit (HOSPITAL_COMMUNITY): Payer: Self-pay

## 2023-07-24 ENCOUNTER — Other Ambulatory Visit: Payer: Self-pay

## 2023-07-24 ENCOUNTER — Other Ambulatory Visit (HOSPITAL_COMMUNITY): Payer: Self-pay

## 2023-07-25 ENCOUNTER — Encounter: Payer: Self-pay | Admitting: Physician Assistant

## 2023-07-25 ENCOUNTER — Other Ambulatory Visit (HOSPITAL_COMMUNITY): Payer: Self-pay

## 2023-07-26 ENCOUNTER — Other Ambulatory Visit (HOSPITAL_COMMUNITY): Payer: Self-pay

## 2023-07-30 ENCOUNTER — Other Ambulatory Visit (HOSPITAL_COMMUNITY): Payer: Self-pay

## 2023-08-07 ENCOUNTER — Other Ambulatory Visit (HOSPITAL_COMMUNITY): Payer: Self-pay

## 2023-08-17 ENCOUNTER — Other Ambulatory Visit (HOSPITAL_COMMUNITY): Payer: Self-pay

## 2023-09-10 ENCOUNTER — Ambulatory Visit: Payer: Medicare Other | Admitting: Physician Assistant

## 2023-09-10 ENCOUNTER — Ambulatory Visit: Payer: Medicare Other

## 2023-09-12 ENCOUNTER — Other Ambulatory Visit: Payer: Self-pay | Admitting: Nurse Practitioner

## 2023-09-12 ENCOUNTER — Other Ambulatory Visit (HOSPITAL_COMMUNITY): Payer: Self-pay

## 2023-09-12 DIAGNOSIS — G4700 Insomnia, unspecified: Secondary | ICD-10-CM

## 2023-09-12 DIAGNOSIS — E1169 Type 2 diabetes mellitus with other specified complication: Secondary | ICD-10-CM

## 2023-09-12 DIAGNOSIS — J4520 Mild intermittent asthma, uncomplicated: Secondary | ICD-10-CM

## 2023-09-12 DIAGNOSIS — F321 Major depressive disorder, single episode, moderate: Secondary | ICD-10-CM

## 2023-09-13 ENCOUNTER — Other Ambulatory Visit (HOSPITAL_COMMUNITY): Payer: Self-pay

## 2023-09-13 ENCOUNTER — Telehealth: Payer: Self-pay | Admitting: Nurse Practitioner

## 2023-09-13 MED ORDER — CITALOPRAM HYDROBROMIDE 20 MG PO TABS
20.0000 mg | ORAL_TABLET | Freq: Every day | ORAL | 0 refills | Status: DC
  Filled 2023-09-13: qty 90, 90d supply, fill #0

## 2023-09-13 MED ORDER — MONTELUKAST SODIUM 10 MG PO TABS
10.0000 mg | ORAL_TABLET | Freq: Every day | ORAL | 0 refills | Status: DC
  Filled 2023-09-13: qty 90, 90d supply, fill #0

## 2023-09-13 MED ORDER — ZOLPIDEM TARTRATE 5 MG PO TABS
5.0000 mg | ORAL_TABLET | Freq: Every evening | ORAL | 0 refills | Status: DC | PRN
Start: 2023-09-13 — End: 2023-10-26
  Filled 2023-09-13 – 2023-09-24 (×2): qty 30, 30d supply, fill #0

## 2023-09-13 MED ORDER — METFORMIN HCL 500 MG PO TABS
500.0000 mg | ORAL_TABLET | Freq: Every day | ORAL | 0 refills | Status: DC
  Filled 2023-09-13: qty 90, 90d supply, fill #0

## 2023-09-13 NOTE — Telephone Encounter (Signed)
Number, not accepting any calls

## 2023-09-13 NOTE — Telephone Encounter (Signed)
Please call patient and let her know that her referral to neurology and endocrinology have been approved. She should call neurology at [phone] to make an appt and she should call endocrinology at  [phone] to make an appt. The patient's cousin (Ms. Foust) helps with appointments and transportation. Please ask the patient permission to call Ms. Foust to see if she can assist with scheduling if permission is given, Ms. Foust's contact number is [phone] and she can be given the above message to help with scheduling/transporting the patient to her follow-up appointments.

## 2023-09-19 NOTE — Telephone Encounter (Signed)
Left voice mail for pt to call back in regards her approval for neurology and endocrinology

## 2023-09-24 ENCOUNTER — Other Ambulatory Visit (HOSPITAL_COMMUNITY): Payer: Self-pay

## 2023-09-26 ENCOUNTER — Other Ambulatory Visit: Payer: Self-pay

## 2023-09-26 ENCOUNTER — Inpatient Hospital Stay (HOSPITAL_COMMUNITY)
Admission: EM | Admit: 2023-09-26 | Discharge: 2023-09-29 | DRG: 558 | Disposition: A | Discharge status: Home Health | Payer: Medicare Other | Attending: Internal Medicine | Admitting: Internal Medicine

## 2023-09-26 ENCOUNTER — Emergency Department (HOSPITAL_COMMUNITY): Payer: Medicare Other

## 2023-09-26 ENCOUNTER — Other Ambulatory Visit (HOSPITAL_COMMUNITY): Payer: Self-pay

## 2023-09-26 ENCOUNTER — Encounter (HOSPITAL_COMMUNITY): Payer: Self-pay

## 2023-09-26 DIAGNOSIS — E66811 Obesity, class 1: Secondary | ICD-10-CM | POA: Diagnosis present

## 2023-09-26 DIAGNOSIS — Z885 Allergy status to narcotic agent status: Secondary | ICD-10-CM

## 2023-09-26 DIAGNOSIS — E872 Acidosis, unspecified: Secondary | ICD-10-CM | POA: Diagnosis not present

## 2023-09-26 DIAGNOSIS — Z9049 Acquired absence of other specified parts of digestive tract: Secondary | ICD-10-CM

## 2023-09-26 DIAGNOSIS — R0689 Other abnormalities of breathing: Secondary | ICD-10-CM | POA: Diagnosis not present

## 2023-09-26 DIAGNOSIS — S199XXA Unspecified injury of neck, initial encounter: Secondary | ICD-10-CM | POA: Diagnosis not present

## 2023-09-26 DIAGNOSIS — Z860100 Personal history of colon polyps, unspecified: Secondary | ICD-10-CM

## 2023-09-26 DIAGNOSIS — Z6830 Body mass index (BMI) 30.0-30.9, adult: Secondary | ICD-10-CM

## 2023-09-26 DIAGNOSIS — W1830XA Fall on same level, unspecified, initial encounter: Secondary | ICD-10-CM | POA: Diagnosis not present

## 2023-09-26 DIAGNOSIS — G4700 Insomnia, unspecified: Secondary | ICD-10-CM | POA: Diagnosis present

## 2023-09-26 DIAGNOSIS — N182 Chronic kidney disease, stage 2 (mild): Secondary | ICD-10-CM | POA: Diagnosis present

## 2023-09-26 DIAGNOSIS — I10 Essential (primary) hypertension: Secondary | ICD-10-CM | POA: Diagnosis not present

## 2023-09-26 DIAGNOSIS — M8580 Other specified disorders of bone density and structure, unspecified site: Secondary | ICD-10-CM | POA: Diagnosis present

## 2023-09-26 DIAGNOSIS — G319 Degenerative disease of nervous system, unspecified: Secondary | ICD-10-CM | POA: Diagnosis not present

## 2023-09-26 DIAGNOSIS — H9190 Unspecified hearing loss, unspecified ear: Secondary | ICD-10-CM | POA: Diagnosis not present

## 2023-09-26 DIAGNOSIS — I129 Hypertensive chronic kidney disease with stage 1 through stage 4 chronic kidney disease, or unspecified chronic kidney disease: Secondary | ICD-10-CM | POA: Diagnosis present

## 2023-09-26 DIAGNOSIS — Z87891 Personal history of nicotine dependence: Secondary | ICD-10-CM

## 2023-09-26 DIAGNOSIS — K5730 Diverticulosis of large intestine without perforation or abscess without bleeding: Secondary | ICD-10-CM | POA: Diagnosis not present

## 2023-09-26 DIAGNOSIS — E1122 Type 2 diabetes mellitus with diabetic chronic kidney disease: Secondary | ICD-10-CM | POA: Diagnosis not present

## 2023-09-26 DIAGNOSIS — M47812 Spondylosis without myelopathy or radiculopathy, cervical region: Secondary | ICD-10-CM | POA: Diagnosis present

## 2023-09-26 DIAGNOSIS — Z96651 Presence of right artificial knee joint: Secondary | ICD-10-CM | POA: Diagnosis not present

## 2023-09-26 DIAGNOSIS — R6889 Other general symptoms and signs: Secondary | ICD-10-CM | POA: Diagnosis not present

## 2023-09-26 DIAGNOSIS — J45909 Unspecified asthma, uncomplicated: Secondary | ICD-10-CM | POA: Diagnosis present

## 2023-09-26 DIAGNOSIS — R413 Other amnesia: Secondary | ICD-10-CM | POA: Diagnosis present

## 2023-09-26 DIAGNOSIS — M6282 Rhabdomyolysis: Principal | ICD-10-CM | POA: Diagnosis present

## 2023-09-26 DIAGNOSIS — Z043 Encounter for examination and observation following other accident: Secondary | ICD-10-CM | POA: Diagnosis not present

## 2023-09-26 DIAGNOSIS — E8729 Other acidosis: Secondary | ICD-10-CM | POA: Insufficient documentation

## 2023-09-26 DIAGNOSIS — I959 Hypotension, unspecified: Secondary | ICD-10-CM | POA: Diagnosis not present

## 2023-09-26 DIAGNOSIS — F39 Unspecified mood [affective] disorder: Secondary | ICD-10-CM | POA: Diagnosis present

## 2023-09-26 DIAGNOSIS — Z66 Do not resuscitate: Secondary | ICD-10-CM | POA: Diagnosis not present

## 2023-09-26 DIAGNOSIS — M545 Low back pain, unspecified: Secondary | ICD-10-CM | POA: Diagnosis not present

## 2023-09-26 DIAGNOSIS — Z981 Arthrodesis status: Secondary | ICD-10-CM

## 2023-09-26 DIAGNOSIS — W010XXA Fall on same level from slipping, tripping and stumbling without subsequent striking against object, initial encounter: Secondary | ICD-10-CM | POA: Diagnosis present

## 2023-09-26 DIAGNOSIS — E860 Dehydration: Secondary | ICD-10-CM | POA: Diagnosis not present

## 2023-09-26 DIAGNOSIS — Z79899 Other long term (current) drug therapy: Secondary | ICD-10-CM

## 2023-09-26 DIAGNOSIS — Y92009 Unspecified place in unspecified non-institutional (private) residence as the place of occurrence of the external cause: Secondary | ICD-10-CM

## 2023-09-26 DIAGNOSIS — E785 Hyperlipidemia, unspecified: Secondary | ICD-10-CM | POA: Diagnosis present

## 2023-09-26 DIAGNOSIS — M549 Dorsalgia, unspecified: Secondary | ICD-10-CM | POA: Diagnosis not present

## 2023-09-26 DIAGNOSIS — Z7409 Other reduced mobility: Secondary | ICD-10-CM | POA: Diagnosis not present

## 2023-09-26 DIAGNOSIS — R4189 Other symptoms and signs involving cognitive functions and awareness: Secondary | ICD-10-CM | POA: Diagnosis present

## 2023-09-26 DIAGNOSIS — S0003XA Contusion of scalp, initial encounter: Secondary | ICD-10-CM | POA: Diagnosis not present

## 2023-09-26 DIAGNOSIS — F32A Depression, unspecified: Secondary | ICD-10-CM | POA: Diagnosis present

## 2023-09-26 DIAGNOSIS — M47816 Spondylosis without myelopathy or radiculopathy, lumbar region: Secondary | ICD-10-CM | POA: Diagnosis not present

## 2023-09-26 DIAGNOSIS — M25551 Pain in right hip: Secondary | ICD-10-CM | POA: Diagnosis not present

## 2023-09-26 DIAGNOSIS — S0990XA Unspecified injury of head, initial encounter: Secondary | ICD-10-CM | POA: Diagnosis not present

## 2023-09-26 DIAGNOSIS — W19XXXA Unspecified fall, initial encounter: Principal | ICD-10-CM

## 2023-09-26 DIAGNOSIS — Z8249 Family history of ischemic heart disease and other diseases of the circulatory system: Secondary | ICD-10-CM

## 2023-09-26 DIAGNOSIS — T730XXA Starvation, initial encounter: Secondary | ICD-10-CM | POA: Insufficient documentation

## 2023-09-26 DIAGNOSIS — S3992XA Unspecified injury of lower back, initial encounter: Secondary | ICD-10-CM | POA: Diagnosis not present

## 2023-09-26 DIAGNOSIS — Z7984 Long term (current) use of oral hypoglycemic drugs: Secondary | ICD-10-CM

## 2023-09-26 DIAGNOSIS — Z833 Family history of diabetes mellitus: Secondary | ICD-10-CM

## 2023-09-26 LAB — CBC WITH DIFFERENTIAL/PLATELET
Abs Immature Granulocytes: 0.03 10*3/uL (ref 0.00–0.07)
Basophils Absolute: 0 10*3/uL (ref 0.0–0.1)
Basophils Relative: 0 %
Eosinophils Absolute: 0 10*3/uL (ref 0.0–0.5)
Eosinophils Relative: 0 %
HCT: 55.4 % — ABNORMAL HIGH (ref 36.0–46.0)
Hemoglobin: 17.4 g/dL — ABNORMAL HIGH (ref 12.0–15.0)
Immature Granulocytes: 0 %
Lymphocytes Relative: 10 %
Lymphs Abs: 1.2 10*3/uL (ref 0.7–4.0)
MCH: 29.9 pg (ref 26.0–34.0)
MCHC: 31.4 g/dL (ref 30.0–36.0)
MCV: 95.4 fL (ref 80.0–100.0)
Monocytes Absolute: 0.9 10*3/uL (ref 0.1–1.0)
Monocytes Relative: 7 %
Neutro Abs: 10.8 10*3/uL — ABNORMAL HIGH (ref 1.7–7.7)
Neutrophils Relative %: 83 %
Platelets: 164 10*3/uL (ref 150–400)
RBC: 5.81 MIL/uL — ABNORMAL HIGH (ref 3.87–5.11)
RDW: 13.3 % (ref 11.5–15.5)
WBC: 13 10*3/uL — ABNORMAL HIGH (ref 4.0–10.5)
nRBC: 0 % (ref 0.0–0.2)

## 2023-09-26 LAB — URINALYSIS, ROUTINE W REFLEX MICROSCOPIC
Bacteria, UA: NONE SEEN
Bilirubin Urine: NEGATIVE
Glucose, UA: NEGATIVE mg/dL
Hgb urine dipstick: NEGATIVE
Ketones, ur: 20 mg/dL — AB
Leukocytes,Ua: NEGATIVE
Nitrite: NEGATIVE
Protein, ur: 100 mg/dL — AB
Specific Gravity, Urine: 1.026 (ref 1.005–1.030)
pH: 5 (ref 5.0–8.0)

## 2023-09-26 LAB — COMPREHENSIVE METABOLIC PANEL
ALT: 21 U/L (ref 0–44)
AST: 37 U/L (ref 15–41)
Albumin: 3.4 g/dL — ABNORMAL LOW (ref 3.5–5.0)
Alkaline Phosphatase: 81 U/L (ref 38–126)
Anion gap: 14 (ref 5–15)
BUN: 24 mg/dL — ABNORMAL HIGH (ref 8–23)
CO2: 20 mmol/L — ABNORMAL LOW (ref 22–32)
Calcium: 9.4 mg/dL (ref 8.9–10.3)
Chloride: 101 mmol/L (ref 98–111)
Creatinine, Ser: 1.06 mg/dL — ABNORMAL HIGH (ref 0.44–1.00)
GFR, Estimated: 54 mL/min — ABNORMAL LOW (ref >60)
Glucose, Bld: 130 mg/dL — ABNORMAL HIGH (ref 70–99)
Potassium: 3.7 mmol/L (ref 3.5–5.1)
Sodium: 135 mmol/L (ref 135–145)
Total Bilirubin: 1.6 mg/dL — ABNORMAL HIGH (ref <1.2)
Total Protein: 5.9 g/dL — ABNORMAL LOW (ref 6.5–8.1)

## 2023-09-26 LAB — CK: Total CK: 606 U/L — ABNORMAL HIGH (ref 38–234)

## 2023-09-26 MED ORDER — LACTATED RINGERS IV BOLUS
500.0000 mL | Freq: Once | INTRAVENOUS | Status: AC
  Administered 2023-09-26: 500 mL via INTRAVENOUS

## 2023-09-26 MED ORDER — LACTATED RINGERS IV BOLUS: 500.0000 mL | Freq: Once | INTRAVENOUS | Status: DC

## 2023-09-26 MED ORDER — ACETAMINOPHEN 500 MG PO TABS: 1000.0000 mg | ORAL_TABLET | Freq: Four times a day (QID) | ORAL | Status: DC | PRN

## 2023-09-26 MED ORDER — CITALOPRAM HYDROBROMIDE 20 MG PO TABS
20.0000 mg | ORAL_TABLET | Freq: Every day | ORAL | Status: DC
  Administered 2023-09-27 – 2023-09-29 (×3): 20 mg via ORAL
  Filled 2023-09-26 (×3): qty 1

## 2023-09-26 MED ORDER — LORATADINE 10 MG PO TABS
10.0000 mg | ORAL_TABLET | Freq: Every day | ORAL | Status: DC
  Administered 2023-09-27 – 2023-09-29 (×3): 10 mg via ORAL
  Filled 2023-09-26 (×3): qty 1

## 2023-09-26 MED ORDER — ENOXAPARIN SODIUM 40 MG/0.4ML IJ SOSY
40.0000 mg | PREFILLED_SYRINGE | INTRAMUSCULAR | Status: DC
  Filled 2023-09-26 (×3): qty 0.4

## 2023-09-26 MED ORDER — OXYCODONE HCL 5 MG PO TABS: 2.5000 mg | ORAL_TABLET | Freq: Four times a day (QID) | ORAL | Status: DC | PRN

## 2023-09-26 MED ORDER — CYCLOBENZAPRINE HCL 5 MG PO TABS
5.0000 mg | ORAL_TABLET | Freq: Every day | ORAL | Status: DC
  Administered 2023-09-27: 5 mg via ORAL
  Filled 2023-09-26: qty 1

## 2023-09-26 MED ORDER — ALBUTEROL SULFATE (2.5 MG/3ML) 0.083% IN NEBU: 2.5000 mg | INHALATION_SOLUTION | Freq: Four times a day (QID) | RESPIRATORY_TRACT | Status: DC | PRN

## 2023-09-26 MED ORDER — OXYCODONE HCL 5 MG PO TABS
5.0000 mg | ORAL_TABLET | Freq: Four times a day (QID) | ORAL | Status: DC | PRN
  Administered 2023-09-27: 5 mg via ORAL
  Filled 2023-09-26 (×2): qty 1

## 2023-09-26 MED ORDER — MONTELUKAST SODIUM 10 MG PO TABS
10.0000 mg | ORAL_TABLET | Freq: Every day | ORAL | Status: DC
  Administered 2023-09-27 – 2023-09-28 (×3): 10 mg via ORAL
  Filled 2023-09-26 (×3): qty 1

## 2023-09-26 MED ORDER — BUDESONIDE 0.25 MG/2ML IN SUSP
0.2500 mg | Freq: Two times a day (BID) | RESPIRATORY_TRACT | Status: DC
  Administered 2023-09-27 – 2023-09-28 (×4): 0.25 mg via RESPIRATORY_TRACT
  Filled 2023-09-26 (×5): qty 2

## 2023-09-26 MED ORDER — FLUTICASONE PROPIONATE 50 MCG/ACT NA SUSP
1.0000 | Freq: Every day | NASAL | Status: DC
  Administered 2023-09-27 – 2023-09-28 (×2): 1 via NASAL
  Filled 2023-09-26: qty 16

## 2023-09-26 MED ORDER — POLYETHYLENE GLYCOL 3350 17 G PO PACK: 17.0000 g | PACK | Freq: Every day | ORAL | Status: DC | PRN

## 2023-09-26 MED ORDER — LACTATED RINGERS IV BOLUS
1000.0000 mL | Freq: Once | INTRAVENOUS | Status: AC
  Administered 2023-09-26: 1000 mL via INTRAVENOUS

## 2023-09-26 NOTE — ED Notes (Signed)
Pt assisted with bedpan and clean brief placed on patient. Pt repositioned in bed. Side rails up x2, call bell in reach. Door open

## 2023-09-26 NOTE — ED Notes (Signed)
Patient removed her own c-collar, advised to leave in place.

## 2023-09-26 NOTE — ED Notes (Signed)
Attempted to ambulate patient. Pt declined to ambulate. Pt states her knee hurts and she does not want to walk because she is fearful of being discharged and falling again.

## 2023-09-26 NOTE — ED Provider Notes (Signed)
[place] EMERGENCY DEPARTMENT AT [facility] Provider Note   CSN: [id number] Arrival date & time: 09/26/23  1531     History {Add pertinent medical, surgical, social history, OB history to HPI:1} Chief Complaint  Patient presents with   Lynn [name] is a 77 y.o. female.   Fall  77 year old female history hyperlipidemia, diabetes, hypertension, depression, asthma presenting for fall.  Patient states she fell about a week and a half ago and has had some chronic right-sided lower back/pelvic pain since then.  However I talked with her family at bedside.  They state they last doctor on Monday.  The try to call her today and can get hold of her so they went to check on her and found her on her right side on the ground.  She was unable get up on her own.  Patient had said she went herself on the way to the bathroom and I think she may have slipped.  Patient denies hitting her head, endorses some right lower back/hip pain.  No chest pain, abdominal pain, upper back pain or neck pain.  Family states she lives alone and has no help at home.  She has had some worsening memory problems and they are concerned about how she is doing at home.  They think she could have been on the ground for up to 31 hours.     Home Medications Prior to Admission medications   Medication Sig Start Date End Date Taking? Authorizing Provider  Accu-Chek FastClix Lancets MISC USE TO TEST BLOOD SUGAR ONCE A DAY 05/02/19   Ardith Dark, MD  ACCU-CHEK SMARTVIEW test strip TEST BLOOD SUGAR ONCE DAILY 06/18/15   [name], MD  acetaminophen (TYLENOL) 325 MG tablet Take 650 mg by mouth every 6 (six) hours as needed for moderate pain.    [provider]  albuterol (PROVENTIL HFA) 108 (90 Base) MCG/ACT inhaler Inhale 2 puffs into the lungs every 6 (six) hours as needed for wheezing or shortness of breath. 07/19/23   [name], NP  atorvastatin (LIPITOR) 40 MG tablet Take 1 tablet  (40 mg total) by mouth daily. 07/19/23 07/18/24  [name], NP  beclomethasone (QVAR REDIHALER) 40 MCG/ACT inhaler Inhale 1 puff into the lungs 2 (two) times daily. 07/19/23   [name], NP  blood glucose meter kit and supplies KIT Test once daily for glucose control.  Dx E11.9 08/10/17   [name], MD  Blood Glucose Monitoring Suppl (ACCU-CHEK GUIDE) w/Device KIT 1 Device by Does not apply route daily. 11/07/18   Ardith Dark, MD  cetirizine (ZYRTEC) 5 MG tablet Take 5 mg by mouth in the morning and at bedtime.    [provider]  citalopram (CELEXA) 20 MG tablet Take 1 tablet (20 mg) by mouth daily. 09/13/23   [name], NP  docusate sodium (COLACE) 100 MG capsule Take 100 mg by mouth daily as needed. Reported on 10/21/2015    [provider]  fluticasone Aleda Grana) 50 MCG/ACT nasal spray 1 spray up each nostril  at bedtime 03/28/17   [name], MD  ipratropium (ATROVENT) 0.03 % nasal spray Place 2 sprays into both nostrils every 12 (twelve) hours. 03/16/22   [name], NP  losartan (COZAAR) 25 MG tablet Take 1 tablet (25 mg total) by mouth daily. 07/19/23   [name], NP  metFORMIN (GLUCOPHAGE) 500 MG tablet Take 1 tablet (500 mg  total) by mouth daily with breakfast. 09/13/23 09/12/24  [name], NP  montelukast (SINGULAIR) 10 MG tablet Take 1 tablet (10 mg total) by mouth at bedtime. 09/13/23   [name], NP  Multiple Vitamins-Minerals (OCUVITE PO) Take 1 tablet by mouth daily.     [provider]  zolpidem (AMBIEN) 5 MG tablet Take 1 tablet (5 mg total) by mouth at bedtime as needed for sleep. 09/13/23   [name], NP      Allergies    Morphine and codeine    Review of Systems   Review of Systems Review of systems completed and notable as per HPI.  ROS otherwise negative.   Physical Exam Updated Vital Signs BP (!) 166/70 (BP Location: Right Arm)   Pulse 90   Temp 98.2 F (36.8 C) (Oral)   Resp 18   SpO2 97%  Physical  Exam Vitals and nursing note reviewed.  Constitutional:      General: She is not in acute distress.    Appearance: She is well-developed.  HENT:     Head: Normocephalic and atraumatic.     Nose: Nose normal.     Mouth/Throat:     Mouth: Mucous membranes are moist.     Pharynx: Oropharynx is clear.  Eyes:     Extraocular Movements: Extraocular movements intact.     Conjunctiva/sclera: Conjunctivae normal.     Pupils: Pupils are equal, round, and reactive to light.  Neck:     Comments: In cervical collar Cardiovascular:     Rate and Rhythm: Normal rate and regular rhythm.     Pulses: Normal pulses.     Heart sounds: Normal heart sounds. No murmur heard. Pulmonary:     Effort: Pulmonary effort is normal. No respiratory distress.     Breath sounds: Normal breath sounds.  Abdominal:     Palpations: Abdomen is soft.     Tenderness: There is no abdominal tenderness.  Musculoskeletal:        General: No swelling.     Cervical back: Neck supple. No tenderness.     Right lower leg: No edema.     Left lower leg: No edema.     Comments: Mild tenderness over the right SI joint/lumbar paraspinal muscles.  No midline spinal tenderness.  No pain with range of motion of the bilateral upper or lower extremities.  No tenderness to palpation of these areas.  Skin:    General: Skin is warm and dry.     Capillary Refill: Capillary refill takes less than 2 seconds.  Neurological:     General: No focal deficit present.     Mental Status: She is alert. Mental status is at baseline.     Cranial Nerves: No cranial nerve deficit.     Sensory: No sensory deficit.     Motor: No weakness.  Psychiatric:        Mood and Affect: Mood normal.     ED Results / Procedures / Treatments   Labs (all labs ordered are listed, but only abnormal results are displayed) Labs Reviewed - No data to display  EKG None  Radiology No results found.  Procedures Procedures  {Document cardiac monitor, telemetry  assessment procedure when appropriate:1}  Medications Ordered in ED Medications - No data to display  ED Course/ Medical Decision Making/ A&P   {   Click here for ABCD2, HEART and other calculatorsREFRESH Note before signing :1}  Medical Decision Making  Medical Decision Making:   Lynn [name] is a 77 y.o. female who presented to the ED today with fall.  Vital signs reviewed.  Collateral from family is concerning for fall and being stuck on the ground for up to 31 hours.  They reports she is roughly at her baseline mental status with some recent memory difficulties.  Patient endorses some pain to her right paraspinal lumbar area and right SI joint.  She has no pain with any motion of the right hip and no pain to her extremities.  Will plan obtain CT of her head and cervical spine as well as CT of the lumbar spine and pelvis to evaluate for pathology.  Obtain some lab work as well given she was on the ground for period of time.   {crccomplexity:27900} Reviewed and confirmed nursing documentation for past medical history, family history, social history.  Reassessment and Plan:   ***    Patient's presentation is most consistent with {EM COPA:27473}     {Document critical care time when appropriate:1} {Document review of labs and clinical decision tools ie heart score, Chads2Vasc2 etc:1}  {Document your independent review of radiology images, and any outside records:1} {Document your discussion with family members, caretakers, and with consultants:1} {Document social determinants of health affecting pt's care:1} {Document your decision making why or why not admission, treatments were needed:1} Final Clinical Impression(s) / ED Diagnoses Final diagnoses:  None    Rx / DC Orders ED Discharge Orders     None

## 2023-09-26 NOTE — ED Triage Notes (Signed)
PT BIB EMS for a fall, was found by her cousin, patient lives alone, history of dementia, was seen by family approximately 24 hours ago, patient is alert and oriented at this time, but does not remember what caused her to fall or how long she has been down.  C-collar in place.  172/98 130/70 HR 90 98% RA CBG 153

## 2023-09-26 NOTE — ED Notes (Signed)
Pts cousin Aggie Cosier taking pts belongings home (clothing, shoes, wallet). Pts glasses left at bedside. Pt resting in bed, side rails up x2, call bell in reach.

## 2023-09-26 NOTE — ED Notes (Signed)
Pts cardiac monitoring confirmed with CCMD.

## 2023-09-27 DIAGNOSIS — M8580 Other specified disorders of bone density and structure, unspecified site: Secondary | ICD-10-CM | POA: Diagnosis present

## 2023-09-27 DIAGNOSIS — N182 Chronic kidney disease, stage 2 (mild): Secondary | ICD-10-CM | POA: Diagnosis present

## 2023-09-27 DIAGNOSIS — Z87891 Personal history of nicotine dependence: Secondary | ICD-10-CM | POA: Diagnosis not present

## 2023-09-27 DIAGNOSIS — W1830XA Fall on same level, unspecified, initial encounter: Secondary | ICD-10-CM

## 2023-09-27 DIAGNOSIS — T730XXA Starvation, initial encounter: Secondary | ICD-10-CM | POA: Insufficient documentation

## 2023-09-27 DIAGNOSIS — E872 Acidosis, unspecified: Secondary | ICD-10-CM

## 2023-09-27 DIAGNOSIS — Z79899 Other long term (current) drug therapy: Secondary | ICD-10-CM | POA: Diagnosis not present

## 2023-09-27 DIAGNOSIS — M47812 Spondylosis without myelopathy or radiculopathy, cervical region: Secondary | ICD-10-CM | POA: Diagnosis present

## 2023-09-27 DIAGNOSIS — S0003XA Contusion of scalp, initial encounter: Secondary | ICD-10-CM | POA: Diagnosis present

## 2023-09-27 DIAGNOSIS — E860 Dehydration: Secondary | ICD-10-CM | POA: Diagnosis not present

## 2023-09-27 DIAGNOSIS — H9190 Unspecified hearing loss, unspecified ear: Secondary | ICD-10-CM | POA: Diagnosis present

## 2023-09-27 DIAGNOSIS — E785 Hyperlipidemia, unspecified: Secondary | ICD-10-CM | POA: Diagnosis present

## 2023-09-27 DIAGNOSIS — W010XXA Fall on same level from slipping, tripping and stumbling without subsequent striking against object, initial encounter: Secondary | ICD-10-CM | POA: Diagnosis present

## 2023-09-27 DIAGNOSIS — I129 Hypertensive chronic kidney disease with stage 1 through stage 4 chronic kidney disease, or unspecified chronic kidney disease: Secondary | ICD-10-CM | POA: Diagnosis present

## 2023-09-27 DIAGNOSIS — E66811 Obesity, class 1: Secondary | ICD-10-CM | POA: Diagnosis present

## 2023-09-27 DIAGNOSIS — Z6830 Body mass index (BMI) 30.0-30.9, adult: Secondary | ICD-10-CM | POA: Diagnosis not present

## 2023-09-27 DIAGNOSIS — K5730 Diverticulosis of large intestine without perforation or abscess without bleeding: Secondary | ICD-10-CM | POA: Diagnosis present

## 2023-09-27 DIAGNOSIS — J45909 Unspecified asthma, uncomplicated: Secondary | ICD-10-CM | POA: Diagnosis present

## 2023-09-27 DIAGNOSIS — E1122 Type 2 diabetes mellitus with diabetic chronic kidney disease: Secondary | ICD-10-CM | POA: Diagnosis present

## 2023-09-27 DIAGNOSIS — I959 Hypotension, unspecified: Secondary | ICD-10-CM

## 2023-09-27 DIAGNOSIS — Y92009 Unspecified place in unspecified non-institutional (private) residence as the place of occurrence of the external cause: Secondary | ICD-10-CM | POA: Diagnosis not present

## 2023-09-27 DIAGNOSIS — M6282 Rhabdomyolysis: Secondary | ICD-10-CM | POA: Diagnosis present

## 2023-09-27 DIAGNOSIS — Z96651 Presence of right artificial knee joint: Secondary | ICD-10-CM | POA: Diagnosis present

## 2023-09-27 DIAGNOSIS — G4700 Insomnia, unspecified: Secondary | ICD-10-CM | POA: Diagnosis present

## 2023-09-27 DIAGNOSIS — Z66 Do not resuscitate: Secondary | ICD-10-CM | POA: Diagnosis present

## 2023-09-27 DIAGNOSIS — F39 Unspecified mood [affective] disorder: Secondary | ICD-10-CM | POA: Diagnosis present

## 2023-09-27 DIAGNOSIS — F32A Depression, unspecified: Secondary | ICD-10-CM | POA: Diagnosis present

## 2023-09-27 DIAGNOSIS — R4189 Other symptoms and signs involving cognitive functions and awareness: Secondary | ICD-10-CM | POA: Diagnosis present

## 2023-09-27 DIAGNOSIS — Z7409 Other reduced mobility: Secondary | ICD-10-CM | POA: Diagnosis present

## 2023-09-27 LAB — CBC
HCT: 42.9 % (ref 36.0–46.0)
Hemoglobin: 14.2 g/dL (ref 12.0–15.0)
MCH: 29 pg (ref 26.0–34.0)
MCHC: 33.1 g/dL (ref 30.0–36.0)
MCV: 87.7 fL (ref 80.0–100.0)
Platelets: 169 10*3/uL (ref 150–400)
RBC: 4.89 MIL/uL (ref 3.87–5.11)
RDW: 13.3 % (ref 11.5–15.5)
WBC: 10 10*3/uL (ref 4.0–10.5)
nRBC: 0 % (ref 0.0–0.2)

## 2023-09-27 LAB — PHOSPHORUS: Phosphorus: 2.6 mg/dL (ref 2.5–4.6)

## 2023-09-27 LAB — BASIC METABOLIC PANEL
Anion gap: 8 (ref 5–15)
BUN: 27 mg/dL — ABNORMAL HIGH (ref 8–23)
CO2: 23 mmol/L (ref 22–32)
Calcium: 9 mg/dL (ref 8.9–10.3)
Chloride: 103 mmol/L (ref 98–111)
Creatinine, Ser: 1.01 mg/dL — ABNORMAL HIGH (ref 0.44–1.00)
GFR, Estimated: 57 mL/min — ABNORMAL LOW (ref >60)
Glucose, Bld: 96 mg/dL (ref 70–99)
Potassium: 3.1 mmol/L — ABNORMAL LOW (ref 3.5–5.1)
Sodium: 134 mmol/L — ABNORMAL LOW (ref 135–145)

## 2023-09-27 LAB — HEPATIC FUNCTION PANEL
ALT: 19 U/L (ref 0–44)
AST: 30 U/L (ref 15–41)
Albumin: 2.9 g/dL — ABNORMAL LOW (ref 3.5–5.0)
Alkaline Phosphatase: 69 U/L (ref 38–126)
Bilirubin, Direct: 0.3 mg/dL — ABNORMAL HIGH (ref 0.0–0.2)
Indirect Bilirubin: 1.6 mg/dL — ABNORMAL HIGH (ref 0.3–0.9)
Total Bilirubin: 1.9 mg/dL — ABNORMAL HIGH (ref <1.2)
Total Protein: 5 g/dL — ABNORMAL LOW (ref 6.5–8.1)

## 2023-09-27 LAB — CK: Total CK: 397 U/L — ABNORMAL HIGH (ref 38–234)

## 2023-09-27 LAB — LACTIC ACID, PLASMA
Lactic Acid, Venous: 1.1 mmol/L (ref 0.5–1.9)
Lactic Acid, Venous: 3.3 mmol/L (ref 0.5–1.9)

## 2023-09-27 LAB — GLUCOSE, CAPILLARY: Glucose-Capillary: 90 mg/dL (ref 70–99)

## 2023-09-27 LAB — MAGNESIUM: Magnesium: 1.9 mg/dL (ref 1.7–2.4)

## 2023-09-27 MED ORDER — OXYCODONE HCL 5 MG PO TABS
5.0000 mg | ORAL_TABLET | Freq: Four times a day (QID) | ORAL | Status: DC | PRN
  Administered 2023-09-27 – 2023-09-29 (×2): 5 mg via ORAL
  Filled 2023-09-27 (×2): qty 1

## 2023-09-27 MED ORDER — ZOLPIDEM TARTRATE 5 MG PO TABS
5.0000 mg | ORAL_TABLET | Freq: Every evening | ORAL | Status: DC | PRN
  Administered 2023-09-28: 5 mg via ORAL
  Filled 2023-09-27 (×2): qty 1

## 2023-09-27 MED ORDER — ACETAMINOPHEN 500 MG PO TABS
1000.0000 mg | ORAL_TABLET | Freq: Three times a day (TID) | ORAL | Status: DC
  Administered 2023-09-27 – 2023-09-29 (×6): 1000 mg via ORAL
  Filled 2023-09-27 (×7): qty 2

## 2023-09-27 MED ORDER — CYCLOBENZAPRINE HCL 5 MG PO TABS
5.0000 mg | ORAL_TABLET | Freq: Three times a day (TID) | ORAL | Status: DC | PRN
  Administered 2023-09-27 – 2023-09-28 (×2): 5 mg via ORAL
  Filled 2023-09-27 (×2): qty 1

## 2023-09-27 MED ORDER — LACTATED RINGERS IV BOLUS
500.0000 mL | Freq: Once | INTRAVENOUS | Status: AC
  Administered 2023-09-27: 500 mL via INTRAVENOUS

## 2023-09-27 MED ORDER — DEXTROSE-SODIUM CHLORIDE 5-0.45 % IV SOLN: INTRAVENOUS | Status: DC

## 2023-09-27 NOTE — Plan of Care (Signed)

## 2023-09-27 NOTE — Progress Notes (Signed)
PT Cancellation Note  Patient Details Name: Lynn [name] MRN: [id number] DOB: Mar 11, 1946   Cancelled Treatment:    Reason Eval/Treat Not Completed: Patient declined, no reason specified  Refused physical therapy evaluation. Aggravated with sleep being interrupted throughout the night. Reoriented pt to situation and location. Does not recall falling or being admitted to hospital. Generally unpleasant during brief conversation. Will attempt to assess this afternoon, schedule permitting, and if pt is willing. Otherwise will try again tomorrow.    [name], PT, DPT [facility]  Rehabilitation Services Physical Therapist Office: [phone] Website: [email or web address]  Berton Mount 09/27/2023, 9:05 AM

## 2023-09-27 NOTE — Progress Notes (Signed)
PROGRESS NOTE  [facility]  DOB: Aug 12, 1946  PCP: [name], NP HKV:[id number]  DOA: 09/26/2023  LOS: 0 days  Hospital Day: 2  Brief narrative: Lynn [name] is a 77 y.o. female with PMH significant for DM2, HTN, HLD, CKD, depression, cognitive impairment 11/20, patient was brought to the ED from home after she was found down on the floor.  Last known normal was 24 hours prior.  Unclear how long she was on the floor for.  Patient was not to recall the circumstances of the fall. In the ED, patient was afebrile, blood pressure 160s, breathing on room air Initial labs with WBC count 13, hemoglobin 17.4, BUN/creatinine 24/1.06, CK elevated to 606,  lactic acid elevated to 3.3>1.1 Urinalysis showed clear amber color urine with negative leukocytes, negative nitrate CT head did not show any acute intracranial process.  It showed atrophy with chronic microvascular ischemic changes. CT medical spine showed multilevel degenerative changes in the cervical spine without evidence of acute fracture. CT lumbar spine showed advanced osteopenia continues to have any traumatic fracture CT pelvis showed sigmoid diverticulosis Admitted to [facility]  Subjective: Patient was seen and examined this morning. Lying on bed.  Elderly.  Not in distress.  Did not want to be bothered.  Not in pain.  No family at bedside. Chart reviewed Since early this morning, patient's blood pressures running low in 80s Most recent labs from this morning with potassium low at 3.1, sodium low at 134  Assessment and plan: Unwitnessed fall  found on the floor, unknown soft stance leading to fall, unknown duration of downtime No prior history of fall, syncope Skeletal survey without evidence of fracture PT eval, pain management Continue telemetry monitoring to look for any significant arrhythmias/pauses Check orthostatic vital signs  Lactic acidosis Rhabdomyolysis Elevated lactic acid and elevated CK level likely  due to dehydration and rhabdomyolysis Lactic acid level normalized.  Repeat CK level this morning shows an improvement. Continue maintenance fluid Recent Labs  Lab 09/26/23 2332 09/27/23 0206  LATICACIDVEN 3.3* 1.1   Recent Labs  Lab 09/26/23 1823 09/27/23 0555  CKTOTAL 606* 397*    Hypotension H/o hypertension Blood pressure running in 80s this morning but has no tachycardia or other symptoms  PTA meds- losartan 25 mg daily Currently losartan on hold.  Start D5 half NS at 75 mL/h  Lower back pain Secondary to fall.  CT lumbar spine rule out fracture. Probably had muscle strain, spasm. Pain management with scheduled Tylenol, as needed oxycodone and as needed muscle relaxants  Cognitive impairment H/o depression Per report, patient has lately been showing signs of dementia.  She also has history of depression.  She was waiting for neurology eval as an outpatient. PTA meds- Celexa 20 mg daily, Ambien 5 mg nightly Continue Celexa.  Continue Ambien as needed.  Type 2 diabetes mellitus A1c 6.3 on September 2024 PTA meds-metformin 500 mg daily Currently on SSI/Accu-Cheks Recent Labs  Lab 09/27/23 0806  GLUCAP 90   Elevated T Bili  Likely due to dehydration.  Continue to monitor Recent Labs  Lab 09/26/23 1823 09/27/23 0555  AST 37 30  ALT 21 19  ALKPHOS 81 69  BILITOT 1.6* 1.9*  PROT 5.9* 5.0*  ALBUMIN 3.4* 2.9*    Hyperlipidemia PTA on Lipitor.  Currently on hold because of elevated CK   Mobility: Pending PT eval  Goals of care   Code Status: Limited: Do not attempt resuscitation (DNR) -DNR-LIMITED -Do Not Intubate/DNI  DVT prophylaxis:  enoxaparin (LOVENOX) injection 40 mg Start: 09/27/23 1000   Antimicrobials: None Fluid: D5 half NS at 75 mL/h Consultants: None Family Communication: None at bedside  Status: Observation Level of care:  Telemetry Medical   Patient is from: Home Needs to continue in-hospital care: Blood pressure low, pending  PT eval Anticipated d/c to: Pending clinical course and PT recommendation   Diet:  Diet Order             Diet regular Room service appropriate? Yes; Fluid consistency: Thin  Diet effective now                   Scheduled Meds:  acetaminophen  1,000 mg Oral TID   budesonide  0.25 mg Inhalation BID   citalopram  20 mg Oral Daily   enoxaparin (LOVENOX) injection  40 mg Subcutaneous Q24H   fluticasone  1 spray Each Nare Daily   loratadine  10 mg Oral Daily   montelukast  10 mg Oral QHS    PRN meds: albuterol, cyclobenzaprine, oxyCODONE, polyethylene glycol, zolpidem   Infusions:   dextrose 5 % and 0.45 % NaCl 75 mL/hr at 09/27/23 0947    Antimicrobials: Anti-infectives (From admission, onward)    None       Objective: Vitals:   09/27/23 0720 09/27/23 0732  BP:  (!) 82/50  Pulse:  73  Resp:    Temp:    SpO2: 97% 100%    Intake/Output Summary (Last 24 hours) at 09/27/2023 1353 Last data filed at 09/27/2023 0034 Gross per 24 hour  Intake 1600 ml  Output --  Net 1600 ml   Filed Weights   09/26/23 2352  Weight: 88.5 kg   Weight change:  Body mass index is 30.54 kg/m.   Physical Exam: General exam: Pleasant elderly Caucasian female.  Lying on bed.  Not in distress Skin: No rashes, lesions or ulcers. HEENT: Atraumatic, normocephalic, no obvious bleeding Lungs: Clear to auscultation bilaterally CVS: Regular rate and rhythm, no murmur GI/Abd soft, nontender, nondistended, bowels are present CNS: Somnolent, oriented to place and person. Psychiatry: Not willing to be bothered today. Extremities: No pedal edema, no calf tenderness  Data Review: I have personally reviewed the laboratory data and studies available.  F/u labs ordered Unresulted Labs (From admission, onward)    None       Admission date and time: 09/26/2023  3:31 PM   Total time spent in review of labs and imaging, patient evaluation, formulation of plan, documentation and  communication with family: 55 minutes  Signed, Lorin Glass, MD [facility] 09/27/2023

## 2023-09-27 NOTE — Progress Notes (Signed)
OT Cancellation Note  Patient Details Name: Lynn [name] MRN: [id number] DOB: May 16, 1946   Cancelled Treatment:    Reason Eval/Treat Not Completed: Fatigue/lethargy limiting ability to participate. Pt sleeping in dark room, did not disturb. Will follow up next day.  Evern Bio 09/27/2023, 12:08 PM Berna Spare, OTR/L Acute Rehabilitation Services Office: [phone]

## 2023-09-27 NOTE — H&P (Addendum)
History and Physical    Lynn [name] MVH:[id number] DOB: 1946/09/16 DOA: 09/26/2023  PCP: [name], NP   Patient coming from: Home   Chief Complaint:  Chief Complaint  Patient presents with   Fall    HPI:  Lynn [name] is a 77 y.o. female, retired [employer] fundraiser, with hx of recent cognitive issues with question of dementia, hypertension, hyperlipidemia, CKD 2, diabetes, who presented to the hospital after being found down at home.  She recalls having a mechanical fall, states she tripped and fell.  However she is unclear about other details of the fall including exactly when she fell and how long she was down on the ground.  Her family last spoke to her on Monday and at that time she was doing well.  Called again on Tuesday but was unable to reach her.  And then on Wednesday again unable to reach so went to her home and found her on the ground unable to get up.  She denies head injury although on imaging she did have a scalp hematoma.  Reports having lower back pain on the right side since her fall.  No other injuries or sites of pain.  She is not on anticoagulants at home.  Acknowledges dehydration due to being down on the ground.  Prior to her fall reports normal p.o. intake, no recent illness.  She and family deny any other recent falls in the past 6 months.  No syncope.  When discussing overall plan for hospitalization notably she is against placement at skilled nursing facility.   Review of Systems:  ROS complete and negative except as marked above   Allergies  Allergen Reactions   Morphine And Codeine Itching    Patient states that she can still take this medication it just makes her itch.    Prior to Admission medications   Medication Sig Start Date End Date Taking? Authorizing Provider  acetaminophen (TYLENOL) 325 MG tablet Take 650 mg by mouth every 6 (six) hours as needed for moderate pain.   Yes [provider]  albuterol (PROVENTIL HFA) 108 (90 Base) MCG/ACT  inhaler Inhale 2 puffs into the lungs every 6 (six) hours as needed for wheezing or shortness of breath. 07/19/23  Yes [name], NP  atorvastatin (LIPITOR) 40 MG tablet Take 1 tablet (40 mg total) by mouth daily. 07/19/23 07/18/24 Yes [name], NP  beclomethasone (QVAR REDIHALER) 40 MCG/ACT inhaler Inhale 1 puff into the lungs 2 (two) times daily. 07/19/23  Yes [name], NP  cetirizine (ZYRTEC) 5 MG tablet Take 5 mg by mouth daily.   Yes [provider]  citalopram (CELEXA) 20 MG tablet Take 1 tablet (20 mg) by mouth daily. 09/13/23  Yes [name], NP  docusate sodium (COLACE) 100 MG capsule Take 100 mg by mouth daily as needed. Reported on 10/21/2015   Yes [provider]  fluticasone (FLONASE) 50 MCG/ACT nasal spray 1 spray up each nostril  at bedtime 03/28/17  Yes [name], MD  ipratropium (ATROVENT) 0.03 % nasal spray Place 2 sprays into both nostrils every 12 (twelve) hours. 03/16/22  Yes [name], NP  losartan (COZAAR) 25 MG tablet Take 1 tablet (25 mg total) by mouth daily. 07/19/23  Yes [name], NP  metFORMIN (GLUCOPHAGE) 500 MG tablet Take 1 tablet (500 mg total) by mouth daily with breakfast. 09/13/23 09/12/24 Yes [name], NP  montelukast (SINGULAIR) 10 MG tablet Take 1 tablet (  10 mg total) by mouth at bedtime. 09/13/23  Yes [name], NP  Multiple Vitamins-Minerals (OCUVITE PO) Take 1 tablet by mouth daily.    Yes [provider]  zolpidem (AMBIEN) 5 MG tablet Take 1 tablet (5 mg total) by mouth at bedtime as needed for sleep. 09/13/23  Yes [name], NP  Accu-Chek FastClix Lancets MISC USE TO TEST BLOOD SUGAR ONCE A DAY 05/02/19   Ardith Dark, MD  ACCU-CHEK SMARTVIEW test strip TEST BLOOD SUGAR ONCE DAILY 06/18/15   [name], MD  blood glucose meter kit and supplies KIT Test once daily for glucose control.  Dx E11.9 08/10/17   [name], MD  Blood Glucose Monitoring Suppl (ACCU-CHEK GUIDE) w/Device KIT 1  Device by Does not apply route daily. 11/07/18   Ardith Dark, MD    Past Medical History:  Diagnosis Date   ASTHMA 03/14/2007   NO PROBELEM IN 12 YRS   Blood dyscrasia    VON WILLIBRANDE "FREE BLEEDER"   COLONIC POLYPS, ADENOMATOUS, 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   DEGENERATION, MACULAR NOS 03/14/2007   DEPRESSION 03/14/2007   DIABETES MELLITUS, TYPE II 03/14/2007   Diarrhea 05/31/2007   DISC DISEASE, LUMBAR 05/25/2009   DIVERTICULOSIS OF COLON 01/11/2009   HYPERLIPIDEMIA 06/21/2010   HYPERTENSION 03/14/2007   Lung abnormality    LT LUNG WITH SPOT HAS BEEN FOLLOWED X 3 YRS DR BURNEY   SEBORRHEIC KERATOSIS, INFLAMED 12/02/2010   Unspecified hearing loss 01/13/2010   VERTIGO 11/10/2010    Past Surgical History:  Procedure Laterality Date   CHOLECYSTECTOMY  1982   urosespsis post op   JOINT REPLACEMENT  1998   1998 RT KNEE  +2009 REMOVED, REPLACED JOINT   KNEE ARTHROSCOPY Right 1995   right, TNR   left shoulder Left 2012   rotator cuff   SPINE SURGERY  1996   fusion, ruptured disk   TONSILLECTOMY  1952     reports that she quit smoking about 24 years ago. Her smoking use included cigarettes. She started smoking about 61 years ago. She has a 74 pack-year smoking history. She has never used smokeless tobacco. She reports current alcohol use. She reports that she does not use drugs.  Family History  Problem Relation Age of Onset   Cancer Mother    Diabetes Mother    Heart disease Mother    Hyperlipidemia Mother    Hypertension Mother    Hyperlipidemia Father    Hypertension Father    Colon cancer Maternal Uncle 9   Colon cancer Maternal Uncle 84   Heart disease Other    Diabetes Other        diabeties   Hyperlipidemia Other    Hypertension Other    Colon cancer Maternal Aunt 70     Physical Exam: Vitals:   09/26/23 2203 09/26/23 2352 09/27/23 0000 09/27/23 0108  BP: (!) 141/65  (!) 161/82 (!) 161/65  Pulse: 89  91 94  Resp: 16  (!) 22 18  Temp:  98.1 F (36.7 C)  98.3 F (36.8 C)   TempSrc:  Oral  Oral  SpO2: 98%  97% 99%  Weight:  88.5 kg    Height:  5\' 7"  (1.702 m)      Gen: Awake, alert, NAD, chronically ill appearing.   CV: Regular, normal S1, S2, 1/6 SEM  Resp: Normal WOB, CTAB  Abd: Flat, normoactive, nontender MSK./ Back: No midline spinal tenderness. There is paraspinal tenderness and area of muscle  spasm in the R lumbar paraspinals. LE symmetric, no edema  Skin: No rashes or lesions to exposed skin  Neuro: Alert and interactive, oriented to person, place, situation; partially to time - 2024, not to month.   Psych: euthymic, appropriate    Data review:   Labs reviewed, notable for:   Bicarb 20, anion gap 14 Lactate 3.3 CK 606 T Bili 1.6  Hemoglobin 17 UA + ketones   Micro:  Results for orders placed or performed during the hospital encounter of 11/23/11  Surgical pcr screen     Status: None   Collection Time: 11/23/11  3:04 PM   Specimen: Nasal Swab  Result Value Ref Range Status   MRSA, PCR NEGATIVE NEGATIVE Final   Staphylococcus aureus NEGATIVE NEGATIVE Final    Comment:        The Xpert SA Assay (FDA approved for NASAL specimens only), is one component of a comprehensive surveillance program.  It is not intended to diagnose infection nor to guide or monitor treatment.    Imaging reviewed:  CT Head Wo Contrast  Result Date: 09/26/2023 CLINICAL DATA:  Fall, head and neck trauma. EXAM: CT HEAD WITHOUT CONTRAST CT CERVICAL SPINE WITHOUT CONTRAST TECHNIQUE: Multidetector CT imaging of the head and cervical spine was performed following the standard protocol without intravenous contrast. Multiplanar CT image reconstructions of the cervical spine were also generated. RADIATION DOSE REDUCTION: This exam was performed according to the departmental dose-optimization program which includes automated exposure control, adjustment of the mA and/or kV according to patient size and/or use of iterative reconstruction technique. COMPARISON:  None  available. FINDINGS: CT HEAD FINDINGS Brain: No acute intracranial hemorrhage, midline shift or mass effect. No extra-axial fluid collection. Diffuse atrophy is noted. Periventricular white matter hypodensities are present bilaterally. No hydrocephalus. Vascular: No hyperdense vessel or unexpected calcification. Skull: Normal. Negative for fracture or focal lesion. Sinuses/Orbits: No acute finding. Other: A scalp hematoma is present over the parietal bone on the left. CT CERVICAL SPINE FINDINGS Alignment: Normal. Skull base and vertebrae: No acute fracture. There is fusion of the C5-C6 vertebral bodies. Soft tissues and spinal canal: No prevertebral fluid or swelling. No visible canal hematoma. Disc levels: Multilevel intervertebral disc space narrowing, disc osteophyte complexes, and facet arthropathy. Upper chest: Emphysematous changes are present at the lung apices. Other: No acute abnormality. IMPRESSION: 1. No acute intracranial process. 2. Atrophy with chronic microvascular ischemic changes. 3. Multilevel degenerative changes in the cervical spine without evidence of acute fracture. Electronically Signed   By: Thornell Sartorius M.D.   On: 09/26/2023 20:38   CT Cervical Spine Wo Contrast  Result Date: 09/26/2023 CLINICAL DATA:  Fall, head and neck trauma. EXAM: CT HEAD WITHOUT CONTRAST CT CERVICAL SPINE WITHOUT CONTRAST TECHNIQUE: Multidetector CT imaging of the head and cervical spine was performed following the standard protocol without intravenous contrast. Multiplanar CT image reconstructions of the cervical spine were also generated. RADIATION DOSE REDUCTION: This exam was performed according to the departmental dose-optimization program which includes automated exposure control, adjustment of the mA and/or kV according to patient size and/or use of iterative reconstruction technique. COMPARISON:  None available. FINDINGS: CT HEAD FINDINGS Brain: No acute intracranial hemorrhage, midline shift or mass  effect. No extra-axial fluid collection. Diffuse atrophy is noted. Periventricular white matter hypodensities are present bilaterally. No hydrocephalus. Vascular: No hyperdense vessel or unexpected calcification. Skull: Normal. Negative for fracture or focal lesion. Sinuses/Orbits: No acute finding. Other: A scalp hematoma is present over the parietal bone on  the left. CT CERVICAL SPINE FINDINGS Alignment: Normal. Skull base and vertebrae: No acute fracture. There is fusion of the C5-C6 vertebral bodies. Soft tissues and spinal canal: No prevertebral fluid or swelling. No visible canal hematoma. Disc levels: Multilevel intervertebral disc space narrowing, disc osteophyte complexes, and facet arthropathy. Upper chest: Emphysematous changes are present at the lung apices. Other: No acute abnormality. IMPRESSION: 1. No acute intracranial process. 2. Atrophy with chronic microvascular ischemic changes. 3. Multilevel degenerative changes in the cervical spine without evidence of acute fracture. Electronically Signed   By: Thornell Sartorius M.D.   On: 09/26/2023 20:38   CT Lumbar Spine Wo Contrast  Result Date: 09/26/2023 CLINICAL DATA:  Back trauma.  Pain. EXAM: CT LUMBAR SPINE WITHOUT CONTRAST TECHNIQUE: Multidetector CT imaging of the lumbar spine was performed without intravenous contrast administration. Multiplanar CT image reconstructions were also generated. RADIATION DOSE REDUCTION: This exam was performed according to the departmental dose-optimization program which includes automated exposure control, adjustment of the mA and/or kV according to patient size and/or use of iterative reconstruction technique. COMPARISON:  None Available. FINDINGS: Segmentation: 5 lumbar type vertebrae. Alignment: Normal. Vertebrae: No acute fracture.  Advanced osteopenia. Paraspinal and other soft tissues: Negative. Disc levels: No acute findings.  Degenerative changes. IMPRESSION: 1. No acute/traumatic lumbar spine pathology. 2.  Advanced osteopenia. Electronically Signed   By: Elgie Collard M.D.   On: 09/26/2023 20:33   CT PELVIS WO CONTRAST  Result Date: 09/26/2023 CLINICAL DATA:  Fall, low back pain and SI joint pain EXAM: CT PELVIS WITHOUT CONTRAST TECHNIQUE: Multidetector CT imaging of the pelvis was performed following the standard protocol without intravenous contrast. RADIATION DOSE REDUCTION: This exam was performed according to the departmental dose-optimization program which includes automated exposure control, adjustment of the mA and/or kV according to patient size and/or use of iterative reconstruction technique. COMPARISON:  None Available. FINDINGS: Urinary Tract:  No abnormality visualized. Bowel:  Sigmoid diverticulosis.  Pelvic small bowel unremarkable. Vascular/Lymphatic: Atherosclerotic calcifications in the iliac vessels. No aneurysm or adenopathy. Reproductive:  Uterus and adnexa unremarkable.  No mass. Other:  No free fluid or free air. Musculoskeletal: No pelvic or proximal femoral fracture. Mild degenerative changes in the hips bilaterally with joint space narrowing and spurring. IMPRESSION: No acute bony abnormality. Sigmoid diverticulosis. 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 Electronically Signed   By: Charlett Nose M.D.   On: 09/26/2023 20:33   DG Chest 2 View  Result Date: 09/26/2023 CLINICAL DATA:  Unwitnessed fall. EXAM: CHEST - 2 VIEW COMPARISON:  October 31, 2013. FINDINGS: Stable cardiomediastinal silhouette. No acute pulmonary disease. Degenerative changes are seen involving both shoulder joints. IMPRESSION: No active cardiopulmonary disease. Electronically Signed   By: Lupita Raider M.D.   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 Family concerned about safety at home, lives alone, although against SNF per our discussion.   Ground level fall, prolonged down time  Reportedly mechanical fall although unclear due to her hx of recent cognitive issues, cannot rule out syncopal event. Dehydrated, with lactic acidosis likely starvation ketosis as well. Hemoconcentrated. CK 606. Imaging including CT Head, C spine, L spine, Pelvis, and CXR without any acute injuries related to fall.  -  Management of underlying dehydration / lactic acidosis below  - Tele monitoring since cannot r/o syncope. Check EKG  - Check orthostatic vitals in AM  - PT/OT evaluation note tentatively she is against SNF. I did not formally assess decision making capacity for disposition decisions but will need to be done if she is refusing considering recent concern for cognitive issues and dementia.  - Avoid CNS depressants as able, I discussed with her re: Ambien use and we would like to avoid this if possible. Has been a chronic medication. If she has behavioral disturbance with dementia could consider atypical antipsychotic such as seroquel as an alternative medication.   Lactic acidosis  Suspect starvation ketosis  AGMA with lactate initially 3.3 -> 1.1 after 2L of IVF. UA with ketones. Not hyperglycemic, no alcohol use. Suspect dehydration and starvation ketosis.  - S/p 2 L IVF, normalized, continue oral hydration as able. Regular diet   Lower back pain, muscle strain / spasm  - Symptomatic management: tylenol prn mild, add cyclobenzaprine 5 mg at bedtime for spasm, oxycodone 2.5 / 5 mg q 6 hr prn for Mod / severe   Elevated T Bili  - Trend LFT    Chronic medial problems:  HTN: Hold home Losartan for now given dehydration  HLD: Hold statin with mild elevation in CK  Cognitive decline: Was referred to neurology OP for evaluation for underlying dementia. No formal diagnosis as of this time.  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 Add CBG checks, SSI if running high.  Insomnia: See above, holding Ambien. Give Melatonin nightly. Consider atypical antipsychotic if any behavioral disturbance associated with suspected underlying dementia.   Body mass index is 30.54 kg/m. Obesity class 1 affecting medical care per above    DVT prophylaxis:  Lovenox Code Status:  DNR/DNI(Do NOT Intubate); confirmed with patient  Diet:  Diet Orders (From admission, onward)     Start     Ordered   09/26/23 2349  Diet regular Room service appropriate? Yes; Fluid consistency: Thin  Diet effective now       Question Answer Comment  Room service appropriate? Yes   Fluid consistency: Thin      09/26/23 2350           Family Communication:  Yes discussed with her cousins at the bedside   Consults:  None   Admission status:   Observation, Telemetry bed  Severity of Illness: The appropriate patient status for this patient is OBSERVATION. Observation status is judged to be reasonable and necessary in order to provide the required intensity of service to ensure the patient's safety. The patient's presenting symptoms, physical exam findings, and initial radiographic and laboratory data in the context of their medical condition is felt to place them at decreased risk for further clinical deterioration. Furthermore, it is anticipated that the patient will be medically stable for discharge from the hospital within 2 midnights of admission.    Dolly Rias, MD [facility]  How to contact the [facility] Attending or Consulting provider 7A - 7P or covering provider during after hours 7P -7A, for this patient.  Check the care team in [facility] and look for a) attending/consulting [facility] provider listed and b) the [facility] team listed Log into [email or web address] and use [place]'s universal password to access. If you do not have the password, please contact the hospital operator. Locate the [facility] provider you are  looking for under [facility] and page  to a number that you can be directly reached. If you still have difficulty reaching the provider, please page the [facility] (Director on Call) for the Hospitalists listed on amion for assistance.  09/27/2023, 3:40 AM

## 2023-09-27 NOTE — Plan of Care (Signed)
  Problem: Education: Goal: Knowledge of General Education information will improve Description: Including pain rating scale, medication(s)/side effects and non-pharmacologic comfort measures 09/27/2023 0346 by Tennis Ship, RN Outcome: Progressing 09/27/2023 0346 by Tennis Ship, RN Outcome: Progressing   Problem: Health Behavior/Discharge Planning: Goal: Ability to manage health-related needs will improve 09/27/2023 0346 by Tennis Ship, RN Outcome: Progressing 09/27/2023 0346 by Tennis Ship, RN Outcome: Progressing   Problem: Clinical Measurements: Goal: Ability to maintain clinical measurements within normal limits will improve 09/27/2023 0346 by Tennis Ship, RN Outcome: Progressing 09/27/2023 0346 by Tennis Ship, RN Outcome: Progressing Goal: Will remain free from infection 09/27/2023 0346 by Tennis Ship, RN Outcome: Progressing 09/27/2023 0346 by Tennis Ship, RN Outcome: Progressing Goal: Diagnostic test results will improve 09/27/2023 0346 by Tennis Ship, RN Outcome: Progressing 09/27/2023 0346 by Tennis Ship, RN Outcome: Progressing Goal: Respiratory complications will improve 09/27/2023 0346 by Tennis Ship, RN Outcome: Progressing 09/27/2023 0346 by Tennis Ship, RN Outcome: Progressing Goal: Cardiovascular complication will be avoided 09/27/2023 0346 by Tennis Ship, RN Outcome: Progressing 09/27/2023 0346 by Tennis Ship, RN Outcome: Progressing   Problem: Activity: Goal: Risk for activity intolerance will decrease 09/27/2023 0346 by Tennis Ship, RN Outcome: Progressing 09/27/2023 0346 by Tennis Ship, RN Outcome: Progressing   Problem: Nutrition: Goal: Adequate nutrition will be maintained 09/27/2023 0346 by Tennis Ship, RN Outcome: Progressing 09/27/2023 0346 by Tennis Ship, RN Outcome: Progressing   Problem: Coping: Goal: Level  of anxiety will decrease 09/27/2023 0346 by Tennis Ship, RN Outcome: Progressing 09/27/2023 0346 by Tennis Ship, RN Outcome: Progressing   Problem: Elimination: Goal: Will not experience complications related to bowel motility 09/27/2023 0346 by Tennis Ship, RN Outcome: Progressing 09/27/2023 0346 by Tennis Ship, RN Outcome: Progressing Goal: Will not experience complications related to urinary retention 09/27/2023 0346 by Tennis Ship, RN Outcome: Progressing 09/27/2023 0346 by Tennis Ship, RN Outcome: Progressing   Problem: Pain Management: Goal: General experience of comfort will improve 09/27/2023 0346 by Tennis Ship, RN Outcome: Progressing 09/27/2023 0346 by Tennis Ship, RN Outcome: Progressing   Problem: Safety: Goal: Ability to remain free from injury will improve 09/27/2023 0346 by Tennis Ship, RN Outcome: Progressing 09/27/2023 0346 by Tennis Ship, RN Outcome: Progressing   Problem: Skin Integrity: Goal: Risk for impaired skin integrity will decrease 09/27/2023 0346 by Tennis Ship, RN Outcome: Progressing 09/27/2023 0346 by Tennis Ship, RN Outcome: Progressing

## 2023-09-27 NOTE — ED Notes (Signed)
ED TO INPATIENT HANDOFF REPORT  ED Nurse Name and Phone #: Ocie Cornfield  S Name/Age/Gender Lynn [name] 77 y.o. female Room/Bed: 025C/025C  Code Status   Code Status: Limited: Do not attempt resuscitation (DNR) -DNR-LIMITED -Do Not Intubate/DNI   Home/SNF/Other Home Patient oriented to: self and place Is this baseline? Yes   Triage Complete: Triage complete  Chief Complaint Ground-level fall [W18.30XA]  Triage Note PT BIB EMS for a fall, was found by her cousin, patient lives alone, history of dementia, was seen by family approximately 24 hours ago, patient is alert and oriented at this time, but does not remember what caused her to fall or how long she has been down.  C-collar in place.  172/98 130/70 HR 90 98% RA CBG 153   Allergies Allergies  Allergen Reactions   Morphine And Codeine Itching    Patient states that she can still take this medication it just makes her itch.    Level of Care/Admitting Diagnosis ED Disposition     ED Disposition  Admit   Condition  --   Comment  Hospital Area: [facility] [[id number]]  Level of Care: Telemetry Medical [104]  May place patient in observation at [facility] or [facility] if equivalent level of care is available:: Yes  Covid Evaluation: Asymptomatic - no recent exposure (last 10 days) testing not required  Diagnosis: Ground-level fall [[id number]]  Admitting Physician: Dolly Rias [[id number]]  Attending Physician: Dolly Rias [[id number]]          B Medical/Surgery History Past Medical History:  Diagnosis Date   ASTHMA 03/14/2007   NO PROBELEM IN 12 YRS   Blood dyscrasia    VON WILLIBRANDE "FREE BLEEDER"   COLONIC POLYPS, ADENOMATOUS, 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   DEGENERATION, MACULAR NOS 03/14/2007   DEPRESSION 03/14/2007   DIABETES MELLITUS, TYPE II 03/14/2007   Diarrhea 05/31/2007   DISC DISEASE, LUMBAR 05/25/2009   DIVERTICULOSIS OF COLON 01/11/2009   HYPERLIPIDEMIA 06/21/2010   HYPERTENSION  03/14/2007   Lung abnormality    LT LUNG WITH SPOT HAS BEEN FOLLOWED X 3 YRS DR BURNEY   SEBORRHEIC KERATOSIS, INFLAMED 12/02/2010   Unspecified hearing loss 01/13/2010   VERTIGO 11/10/2010   Past Surgical History:  Procedure Laterality Date   CHOLECYSTECTOMY  1982   urosespsis post op   JOINT REPLACEMENT  1998   1998 RT KNEE  +2009 REMOVED, REPLACED JOINT   KNEE ARTHROSCOPY Right 1995   right, TNR   left shoulder Left 2012   rotator cuff   SPINE SURGERY  1996   fusion, ruptured disk   TONSILLECTOMY  1952     A IV Location/Drains/Wounds Patient Lines/Drains/Airways Status     Active Line/Drains/Airways     Name Placement date Placement time Site Days   Peripheral IV 09/26/23 20 G Right Forearm 09/26/23  2104  Forearm  1   Incision 11/30/11 Other (Comment) Left 11/30/11  1622  -- 4319            Intake/Output Last 24 hours  Intake/Output Summary (Last 24 hours) at 09/27/2023 0033 Last data filed at 09/26/2023 2205 Gross per 24 hour  Intake 500 ml  Output --  Net 500 ml    Labs/Imaging Results for orders placed or performed during the hospital encounter of 09/26/23 (from the past 48 hour(s))  CBC with Differential     Status: Abnormal   Collection Time: 09/26/23  4:54 PM  Result Value Ref Range   WBC 13.0 (H)  4.0 - 10.5 K/uL   RBC 5.81 (H) 3.87 - 5.11 MIL/uL   Hemoglobin 17.4 (H) 12.0 - 15.0 g/dL   HCT 65.7 (H) 84.6 - 96.2 %   MCV 95.4 80.0 - 100.0 fL   MCH 29.9 26.0 - 34.0 pg   MCHC 31.4 30.0 - 36.0 g/dL   RDW 95.2 84.1 - 32.4 %   Platelets 164 150 - 400 K/uL   nRBC 0.0 0.0 - 0.2 %   Neutrophils Relative % 83 %   Neutro Abs 10.8 (H) 1.7 - 7.7 K/uL   Lymphocytes Relative 10 %   Lymphs Abs 1.2 0.7 - 4.0 K/uL   Monocytes Relative 7 %   Monocytes Absolute 0.9 0.1 - 1.0 K/uL   Eosinophils Relative 0 %   Eosinophils Absolute 0.0 0.0 - 0.5 K/uL   Basophils Relative 0 %   Basophils Absolute 0.0 0.0 - 0.1 K/uL   Immature Granulocytes 0 %   Abs Immature  Granulocytes 0.03 0.00 - 0.07 K/uL    Comment: Performed at [facility], [address] [place], [place] [address]  CK     Status: Abnormal   Collection Time: 09/26/23  6:23 PM  Result Value Ref Range   Total CK 606 (H) 38 - 234 U/L    Comment: Performed at [facility], [address], [place], [place] [address]  Comprehensive metabolic panel     Status: Abnormal   Collection Time: 09/26/23  6:23 PM  Result Value Ref Range   Sodium 135 135 - 145 mmol/L   Potassium 3.7 3.5 - 5.1 mmol/L   Chloride 101 98 - 111 mmol/L   CO2 20 (L) 22 - 32 mmol/L   Glucose, Bld 130 (H) 70 - 99 mg/dL    Comment: Glucose reference range applies only to samples taken after fasting for at least 8 hours.   BUN 24 (H) 8 - 23 mg/dL   Creatinine, Ser 6.44 (H) 0.44 - 1.00 mg/dL   Calcium 9.4 8.9 - 03.4 mg/dL   Total Protein 5.9 (L) 6.5 - 8.1 g/dL   Albumin 3.4 (L) 3.5 - 5.0 g/dL   AST 37 15 - 41 U/L   ALT 21 0 - 44 U/L   Alkaline Phosphatase 81 38 - 126 U/L   Total Bilirubin 1.6 (H) <1.2 mg/dL   GFR, Estimated 54 (L) >60 mL/min    Comment: (NOTE) Calculated using the CKD-EPI Creatinine Equation (2021)    Anion gap 14 5 - 15    Comment: Performed at [facility], [address] [place], [place] [address]  Urinalysis, Routine w reflex microscopic -Urine, Clean Catch     Status: Abnormal   Collection Time: 09/26/23  8:57 PM  Result Value Ref Range   Color, Urine AMBER (A) YELLOW    Comment: BIOCHEMICALS MAY BE AFFECTED BY COLOR   APPearance CLEAR CLEAR   Specific Gravity, Urine 1.026 1.005 - 1.030   pH 5.0 5.0 - 8.0   Glucose, UA NEGATIVE NEGATIVE mg/dL   Hgb urine dipstick NEGATIVE NEGATIVE   Bilirubin Urine NEGATIVE NEGATIVE   Ketones, ur 20 (A) NEGATIVE mg/dL   Protein, ur 563 (A) NEGATIVE mg/dL   Nitrite NEGATIVE NEGATIVE   Leukocytes,Ua NEGATIVE NEGATIVE   RBC / HPF 0-5 0 - 5 RBC/hpf   WBC, UA 0-5 0 - 5 WBC/hpf   Bacteria, UA NONE SEEN NONE SEEN   Squamous Epithelial / HPF  0-5 0 - 5 /HPF  Comment: Performed at [facility], [address], [place], [place] [address]  Lactic acid, plasma     Status: Abnormal   Collection Time: 09/26/23 11:32 PM  Result Value Ref Range   Lactic Acid, Venous 3.3 (HH) 0.5 - 1.9 mmol/L    Comment: CRITICAL RESULT CALLED TO, READ BACK BY AND VERIFIED WITH Karle Barr RN 09/27/23 @0000  BY JWHITE Performed at [facility], [address] [place], [place] [address]    CT Head Wo Contrast  Result Date: 09/26/2023 CLINICAL DATA:  Fall, head and neck trauma. EXAM: CT HEAD WITHOUT CONTRAST CT CERVICAL SPINE WITHOUT CONTRAST TECHNIQUE: Multidetector CT imaging of the head and cervical spine was performed following the standard protocol without intravenous contrast. Multiplanar CT image reconstructions of the cervical spine were also generated. RADIATION DOSE REDUCTION: This exam was performed according to the departmental dose-optimization program which includes automated exposure control, adjustment of the mA and/or kV according to patient size and/or use of iterative reconstruction technique. COMPARISON:  None available. FINDINGS: CT HEAD FINDINGS Brain: No acute intracranial hemorrhage, midline shift or mass effect. No extra-axial fluid collection. Diffuse atrophy is noted. Periventricular white matter hypodensities are present bilaterally. No hydrocephalus. Vascular: No hyperdense vessel or unexpected calcification. Skull: Normal. Negative for fracture or focal lesion. Sinuses/Orbits: No acute finding. Other: A scalp hematoma is present over the parietal bone on the left. CT CERVICAL SPINE FINDINGS Alignment: Normal. Skull base and vertebrae: No acute fracture. There is fusion of the C5-C6 vertebral bodies. Soft tissues and spinal canal: No prevertebral fluid or swelling. No visible canal hematoma. Disc levels: Multilevel intervertebral disc space narrowing, disc osteophyte complexes, and facet arthropathy. Upper chest:  Emphysematous changes are present at the lung apices. Other: No acute abnormality. IMPRESSION: 1. No acute intracranial process. 2. Atrophy with chronic microvascular ischemic changes. 3. Multilevel degenerative changes in the cervical spine without evidence of acute fracture. Electronically Signed   By: Thornell Sartorius M.D.   On: 09/26/2023 20:38   CT Cervical Spine Wo Contrast  Result Date: 09/26/2023 CLINICAL DATA:  Fall, head and neck trauma. EXAM: CT HEAD WITHOUT CONTRAST CT CERVICAL SPINE WITHOUT CONTRAST TECHNIQUE: Multidetector CT imaging of the head and cervical spine was performed following the standard protocol without intravenous contrast. Multiplanar CT image reconstructions of the cervical spine were also generated. RADIATION DOSE REDUCTION: This exam was performed according to the departmental dose-optimization program which includes automated exposure control, adjustment of the mA and/or kV according to patient size and/or use of iterative reconstruction technique. COMPARISON:  None available. FINDINGS: CT HEAD FINDINGS Brain: No acute intracranial hemorrhage, midline shift or mass effect. No extra-axial fluid collection. Diffuse atrophy is noted. Periventricular white matter hypodensities are present bilaterally. No hydrocephalus. Vascular: No hyperdense vessel or unexpected calcification. Skull: Normal. Negative for fracture or focal lesion. Sinuses/Orbits: No acute finding. Other: A scalp hematoma is present over the parietal bone on the left. CT CERVICAL SPINE FINDINGS Alignment: Normal. Skull base and vertebrae: No acute fracture. There is fusion of the C5-C6 vertebral bodies. Soft tissues and spinal canal: No prevertebral fluid or swelling. No visible canal hematoma. Disc levels: Multilevel intervertebral disc space narrowing, disc osteophyte complexes, and facet arthropathy. Upper chest: Emphysematous changes are present at the lung apices. Other: No acute abnormality. IMPRESSION: 1. No  acute intracranial process. 2. Atrophy with chronic microvascular ischemic changes. 3. Multilevel degenerative changes in the cervical spine without evidence of acute fracture. Electronically Signed   By: Vernona Rieger  Ladona Ridgel M.D.   On: 09/26/2023 20:38   CT Lumbar Spine Wo Contrast  Result Date: 09/26/2023 CLINICAL DATA:  Back trauma.  Pain. EXAM: CT LUMBAR SPINE WITHOUT CONTRAST TECHNIQUE: Multidetector CT imaging of the lumbar spine was performed without intravenous contrast administration. Multiplanar CT image reconstructions were also generated. RADIATION DOSE REDUCTION: This exam was performed according to the departmental dose-optimization program which includes automated exposure control, adjustment of the mA and/or kV according to patient size and/or use of iterative reconstruction technique. COMPARISON:  None Available. FINDINGS: Segmentation: 5 lumbar type vertebrae. Alignment: Normal. Vertebrae: No acute fracture.  Advanced osteopenia. Paraspinal and other soft tissues: Negative. Disc levels: No acute findings.  Degenerative changes. IMPRESSION: 1. No acute/traumatic lumbar spine pathology. 2. Advanced osteopenia. Electronically Signed   By: Elgie Collard M.D.   On: 09/26/2023 20:33   CT PELVIS WO CONTRAST  Result Date: 09/26/2023 CLINICAL DATA:  Fall, low back pain and SI joint pain EXAM: CT PELVIS WITHOUT CONTRAST TECHNIQUE: Multidetector CT imaging of the pelvis was performed following the standard protocol without intravenous contrast. RADIATION DOSE REDUCTION: This exam was performed according to the departmental dose-optimization program which includes automated exposure control, adjustment of the mA and/or kV according to patient size and/or use of iterative reconstruction technique. COMPARISON:  None Available. FINDINGS: Urinary Tract:  No abnormality visualized. Bowel:  Sigmoid diverticulosis.  Pelvic small bowel unremarkable. Vascular/Lymphatic: Atherosclerotic calcifications in the iliac  vessels. No aneurysm or adenopathy. Reproductive:  Uterus and adnexa unremarkable.  No mass. Other:  No free fluid or free air. Musculoskeletal: No pelvic or proximal femoral fracture. Mild degenerative changes in the hips bilaterally with joint space narrowing and spurring. IMPRESSION: No acute bony abnormality. Sigmoid diverticulosis. 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 Electronically Signed   By: Charlett Nose M.D.   On: 09/26/2023 20:33   DG Chest 2 View  Result Date: 09/26/2023 CLINICAL DATA:  Unwitnessed fall. EXAM: CHEST - 2 VIEW COMPARISON:  October 31, 2013. FINDINGS: Stable cardiomediastinal silhouette. No acute pulmonary disease. Degenerative changes are seen involving both shoulder joints. IMPRESSION: No active cardiopulmonary disease. Electronically Signed   By: Lupita Raider M.D.   On: 09/26/2023 20:18    Pending Labs Unresulted Labs (From admission, onward)     Start     Ordered   09/27/23 0500  Basic metabolic panel  Tomorrow morning,   R        09/26/23 2350   09/27/23 0500  CBC  Tomorrow morning,   R        09/26/23 2350   09/27/23 0500  Magnesium  Tomorrow morning,   R        09/26/23 2350   09/27/23 0500  Phosphorus  Tomorrow morning,   R        09/26/23 2350   09/27/23 0500  Hepatic function panel  Tomorrow morning,   R        09/26/23 2350   09/27/23 0200  Lactic acid, plasma  (Lactic Acid)  ONCE - STAT,   STAT        09/27/23 0029            Vitals/Pain Today's Vitals   09/26/23 1928 09/26/23 1932 09/26/23 2203 09/26/23 2352  BP: (!) 142/58  (!) 141/65   Pulse: 88  89   Resp: 15  16   Temp: 98.8 F (37.1 C)   98.1 F (36.7 C)  TempSrc: Axillary   Oral  SpO2: 95%  98%  Weight:    88.5 kg  Height:    5\' 7"  (1.702 m)  PainSc:  Asleep      Isolation Precautions No active isolations  Medications Medications  enoxaparin (LOVENOX) injection 40 mg (has no administration in time range)  acetaminophen (TYLENOL) tablet 1,000 mg (has no  administration in time range)  oxyCODONE (Oxy IR/ROXICODONE) immediate release tablet 2.5 mg (has no administration in time range)    Or  oxyCODONE (Oxy IR/ROXICODONE) immediate release tablet 5 mg (has no administration in time range)  cyclobenzaprine (FLEXERIL) tablet 5 mg (has no administration in time range)  polyethylene glycol (MIRALAX / GLYCOLAX) packet 17 g (has no administration in time range)  citalopram (CELEXA) tablet 20 mg (has no administration in time range)  albuterol (PROVENTIL) (2.5 MG/3ML) 0.083% nebulizer solution 2.5 mg (has no administration in time range)  budesonide (PULMICORT) nebulizer solution 0.25 mg (has no administration in time range)  loratadine (CLARITIN) tablet 10 mg (has no administration in time range)  fluticasone (FLONASE) 50 MCG/ACT nasal spray 1 spray (has no administration in time range)  montelukast (SINGULAIR) tablet 10 mg (has no administration in time range)  lactated ringers bolus 500 mL (0 mLs Intravenous Stopped 09/26/23 2205)  lactated ringers bolus 1,000 mL (1,000 mLs Intravenous New Bag/Given 09/26/23 2321)  lactated ringers bolus 500 mL (500 mLs Intravenous New Bag/Given 09/27/23 0024)    Mobility Walks at baseline but was unable to walk in ED even with assistance, here for fall     Focused Assessments    R Recommendations: See Admitting Provider Note  Report given to:   Additional Notes:  Needs repeat lactic acid 30 min after IVF, order placed for 0200 collection

## 2023-09-28 DIAGNOSIS — I959 Hypotension, unspecified: Secondary | ICD-10-CM | POA: Diagnosis not present

## 2023-09-28 DIAGNOSIS — E860 Dehydration: Secondary | ICD-10-CM | POA: Diagnosis not present

## 2023-09-28 DIAGNOSIS — E872 Acidosis, unspecified: Secondary | ICD-10-CM | POA: Diagnosis not present

## 2023-09-28 DIAGNOSIS — W1830XA Fall on same level, unspecified, initial encounter: Secondary | ICD-10-CM | POA: Diagnosis not present

## 2023-09-28 NOTE — Plan of Care (Signed)

## 2023-09-28 NOTE — Evaluation (Signed)
Occupational Therapy Evaluation Patient Details Name: Lynn [name] MRN: [id number] DOB: May 16, 1946 Today's Date: 09/28/2023   History of Present Illness Pt is a 77 year old woman admitted on 11/20 after found down by her cousin for an unknown time. PMH: depression, HLD, DM, asthma, CKD, cognitive impairment, R TKA and revision, L TSA.   Clinical Impression   Pt lives alone, walks without AD, functions independently in ADLs and light meal prep. Her cousin has been providing transportation x 6 months and manages pt's finances. Pt has been managing her own medications. Pt presents with significant memory deficits with poor awareness of deficits and safety. She requires min assist for OOB mobility with RW and set up to moderate assistance for ADLs. Patient will benefit from continued inpatient follow up therapy, <3 hours/day. Cousin is aware pt may need long term plan for care.      If plan is discharge home, recommend the following: A little help with walking and/or transfers;A lot of help with bathing/dressing/bathroom;Assistance with cooking/housework;Direct supervision/assist for medications management;Direct supervision/assist for financial management;Assist for transportation;Help with stairs or ramp for entrance    Functional Status Assessment  Patient has had a recent decline in their functional status and demonstrates the ability to make significant improvements in function in a reasonable and predictable amount of time.  Equipment Recommendations  BSC/3in1 (RW)    Recommendations for Other Services       Precautions / Restrictions Precautions Precautions: Fall Restrictions Weight Bearing Restrictions: No      Mobility Bed Mobility               General bed mobility comments: seated EOB at beginning and end of session    Transfers Overall transfer level: Needs assistance Equipment used: 1 person hand held assist Transfers: Sit to/from Stand Sit to Stand: Min  assist, From elevated surface           General transfer comment: hand held assist to rise and steady, once pt began taking steps, became agreeable to use of walker      Balance Overall balance assessment: Needs assistance   Sitting balance-Leahy Scale: Good       Standing balance-Leahy Scale: Poor                             ADL either performed or assessed with clinical judgement   ADL Overall ADL's : Needs assistance/impaired Eating/Feeding: Independent   Grooming: Wash/dry hands;Standing;Supervision/safety   Upper Body Bathing: Minimal assistance;Sitting   Lower Body Bathing: Moderate assistance;Sit to/from stand   Upper Body Dressing : Set up;Sitting   Lower Body Dressing: Moderate assistance;Sit to/from stand   Toilet Transfer: Ambulation;Rolling walker (2 wheels);Minimal assistance   Toileting- Clothing Manipulation and Hygiene: Contact guard assist;Sit to/from stand       Functional mobility during ADLs: Rolling walker (2 wheels);Minimal assistance General ADL Comments: pt unlikely to be able to recall how to use AE if issued     Vision Baseline Vision/History: 1 Wears glasses Ability to See in Adequate Light: 1 Impaired Patient Visual Report: No change from baseline Additional Comments: uses magnifying glass for small print, unaware of eye diseases     Perception         Praxis         Pertinent Vitals/Pain Pain Assessment Pain Assessment: No/denies pain     Extremity/Trunk Assessment Upper Extremity Assessment Upper Extremity Assessment: Generalized weakness;Right hand dominant (hx of L shoulder  replacement)   Lower Extremity Assessment Lower Extremity Assessment: Defer to PT evaluation       Communication Communication Communication: No apparent difficulties   Cognition Arousal: Alert Behavior During Therapy: Flat affect Overall Cognitive Status: Impaired/Different from baseline Area of Impairment: Orientation,  Memory, Safety/judgement, Problem solving                 Orientation Level: Disoriented to, Time, Situation   Memory: Decreased short-term memory   Safety/Judgement: Decreased awareness of safety, Decreased awareness of deficits   Problem Solving: Slow processing, Decreased initiation       General Comments       Exercises     Shoulder Instructions      Home Living Family/patient expects to be discharged to:: Private residence Living Arrangements: Alone Available Help at Discharge: Family;Available PRN/intermittently Type of Home: Other(Comment) (townhouse) Home Access: Stairs to enter [employer] of Steps: 1   Home Layout: One level     Bathroom Shower/Tub: Producer, television/film/video: Standard     Home Equipment: None          Prior Functioning/Environment Prior Level of Function : Needs assist             Mobility Comments: walks without AD, hx of falls, cousin drives her to grocery store ADLs Comments: cousin pays her bills online, pt does not cook per cousin, pt is independent in self care, light meal prep and manages her own medications, avoids socks and wears slip on shoes, pt is a retired Charity fundraiser from [employer]        OT Problem List: Decreased strength;Impaired balance (sitting and/or standing);Decreased cognition;Decreased safety awareness;Decreased knowledge of use of DME or AE      OT Treatment/Interventions: Self-care/ADL training;DME and/or AE instruction;Therapeutic activities;Patient/family education;Balance training;Cognitive remediation/compensation    OT Goals(Current goals can be found in the care plan section) Acute Rehab OT Goals OT Goal Formulation: With patient/family Time For Goal Achievement: 10/12/23 Potential to Achieve Goals: Good  OT Frequency: Min 1X/week    Co-evaluation              AM-PAC OT "6 Clicks" Daily Activity     Outcome Measure Help from another person eating meals?: None Help from  another person taking care of personal grooming?: A Little Help from another person toileting, which includes using toliet, bedpan, or urinal?: A Little Help from another person bathing (including washing, rinsing, drying)?: A Lot Help from another person to put on and taking off regular upper body clothing?: A Little Help from another person to put on and taking off regular lower body clothing?: A Lot 6 Click Score: 17   End of Session Equipment Utilized During Treatment: Rolling walker (2 wheels);Gait belt  Activity Tolerance: Patient tolerated treatment well Patient left: in bed;with call bell/phone within reach;with family/visitor present  OT Visit Diagnosis: Unsteadiness on feet (R26.81);Other abnormalities of gait and mobility (R26.89);Muscle weakness (generalized) (M62.81);Other symptoms and signs involving cognitive function;History of falling (Z91.81)                Time: 1324-4010 OT Time Calculation (min): 24 min Charges:  OT General Charges $OT Visit: 1 Visit OT Evaluation $OT Eval Moderate Complexity: 1 Mod OT Treatments $Self Care/Home Management : 8-22 mins  Berna Spare, OTR/L Acute Rehabilitation Services Office: [phone]   Evern Bio 09/28/2023, 11:38 AM

## 2023-09-28 NOTE — Progress Notes (Signed)
    Durable Medical Equipment  (From admission, onward)           Start     Ordered   09/28/23 1437  For home use only DME Walker rolling  Once       Question Answer Comment  Walker: With 5 Inch Wheels   Patient needs a walker to treat with the following condition Generalized weakness      09/28/23 1436

## 2023-09-28 NOTE — Evaluation (Signed)
Physical Therapy Evaluation Patient Details Name: Lynn [name] MRN: [id number] DOB: Dec 01, 1945 Today's Date: 09/28/2023  History of Present Illness  Pt is a 77 year old woman admitted on 11/20 after found down by her cousin for an unknown time. PMH: depression, HLD, DM, asthma, CKD, cognitive impairment, R TKA and revision, L TSA.  Clinical Impression  Pt admitted with above diagnosis. Pt with poor awareness of safety and deficits. CGA for transfers and bed mobility with notable effort, heavy VC to complete simple tasks. Min assist for RW control with gait and cues to use at all times due to pushing to the side intermittently. Limit assist at d/c. Patient will benefit from continued inpatient follow up therapy, <3 hours/day.  Pt currently with functional limitations due to the deficits listed below (see PT Problem List). Pt will benefit from acute skilled PT to increase their independence and safety with mobility to allow discharge.           If plan is discharge home, recommend the following: A little help with walking and/or transfers;A little help with bathing/dressing/bathroom;Assistance with cooking/housework;Direct supervision/assist for medications management;Assist for transportation;Direct supervision/assist for financial management;Help with stairs or ramp for entrance;Supervision due to cognitive status   Can travel by private vehicle   Yes    Equipment Recommendations Rolling walker (2 wheels)  Recommendations for Other Services       Functional Status Assessment Patient has had a recent decline in their functional status and demonstrates the ability to make significant improvements in function in a reasonable and predictable amount of time.     Precautions / Restrictions Precautions Precautions: Fall Restrictions Weight Bearing Restrictions: No      Mobility  Bed Mobility Overal bed mobility: Needs Assistance Bed Mobility: Sit to Supine       Sit to supine:  Contact guard assist   General bed mobility comments: Sitting EOB start of session. CGA to return to supine. Max VC for technique, difficulty lifting LEs into bed.    Transfers Overall transfer level: Needs assistance Equipment used: Rolling walker (2 wheels) Transfers: Sit to/from Stand Sit to Stand: Contact guard assist, From elevated surface           General transfer comment: CGA for safety to rise from EOB elevated height. Slow to rise and effortful. Stable with RW for support. Pt needs cues to keep RW within BOS prior to sitting as she pushes it away.    Ambulation/Gait Ambulation/Gait assistance: Min assist Gait Distance (Feet): 70 Feet Assistive device: Rolling walker (2 wheels) Gait Pattern/deviations: Step-through pattern, Decreased stride length, Knee flexed in stance - right, Trunk flexed, Ataxic Gait velocity: dec Gait velocity interpretation: <1.31 ft/sec, indicative of household ambulator   General Gait Details: Educated on safe AD use with RW for support. Slow and antalgic. Cues for upright posture, proximity to device and step symmetry. Min assist for RW control with turns. Mild instability but without overt LOB while using RW. Pt requires cues to continue to hold RW when navigating in room as she pushes it to the side.  Stairs            Wheelchair Mobility     Tilt Bed    Modified Rankin (Stroke Patients Only)       Balance Overall balance assessment: Needs assistance Sitting-balance support: No upper extremity supported, Feet supported Sitting balance-Leahy Scale: Good     Standing balance support: Bilateral upper extremity supported, Reliant on assistive device for balance Standing balance-Leahy Scale:  Poor                               Pertinent Vitals/Pain Pain Assessment Pain Assessment: Faces Faces Pain Scale: Hurts little more Pain Location: rt knee Pain Descriptors / Indicators: Aching Pain Intervention(s): Monitored  during session, Repositioned    Home Living Family/patient expects to be discharged to:: Private residence Living Arrangements: Alone Available Help at Discharge: Family;Available PRN/intermittently Type of Home: Other(Comment) (townhouse) Home Access: Stairs to enter   [employer] of Steps: 1   Home Layout: One level Home Equipment: None      Prior Function Prior Level of Function : Needs assist             Mobility Comments: walks without AD, hx of falls, cousin drives her to grocery store ADLs Comments: cousin pays her bills online, pt does not cook per cousin, pt is independent in self care, light meal prep and manages her own medications, avoids socks and wears slip on shoes, pt is a retired Charity fundraiser from [employer]     Extremity/Trunk Assessment   Upper Extremity Assessment Upper Extremity Assessment: Defer to OT evaluation    Lower Extremity Assessment Lower Extremity Assessment: Generalized weakness;RLE deficits/detail RLE Deficits / Details: limited ROM Rt knee, apparently baseline       Communication   Communication Communication: No apparent difficulties  Cognition Arousal: Alert Behavior During Therapy: Flat affect, Agitated Overall Cognitive Status: Impaired/Different from baseline Area of Impairment: Orientation, Memory, Safety/judgement, Problem solving                 Orientation Level: Disoriented to, Time, Situation   Memory: Decreased short-term memory   Safety/Judgement: Decreased awareness of safety, Decreased awareness of deficits   Problem Solving: Slow processing, Decreased initiation          General Comments      Exercises General Exercises - Lower Extremity Long Arc Quad: AROM, Right, 10 reps, Seated   Assessment/Plan    PT Assessment Patient needs continued PT services  PT Problem List Decreased strength;Decreased range of motion;Decreased activity tolerance;Decreased balance;Decreased mobility;Decreased  cognition;Decreased knowledge of use of DME;Pain;Obesity;Decreased safety awareness;Decreased knowledge of precautions       PT Treatment Interventions DME instruction;Gait training;Functional mobility training;Therapeutic activities;Therapeutic exercise;Balance training;Neuromuscular re-education;Cognitive remediation;Patient/family education;Modalities    PT Goals (Current goals can be found in the Care Plan section)  Acute Rehab PT Goals Patient Stated Goal: Get well PT Goal Formulation: With patient/family Time For Goal Achievement: 10/12/23 Potential to Achieve Goals: Good    Frequency Min 1X/week     Co-evaluation               AM-PAC PT "6 Clicks" Mobility  Outcome Measure Help needed turning from your back to your side while in a flat bed without using bedrails?: A Little Help needed moving from lying on your back to sitting on the side of a flat bed without using bedrails?: A Little Help needed moving to and from a bed to a chair (including a wheelchair)?: A Little Help needed standing up from a chair using your arms (e.g., wheelchair or bedside chair)?: A Little Help needed to walk in hospital room?: A Little Help needed climbing 3-5 steps with a railing? : A Lot 6 Click Score: 17    End of Session Equipment Utilized During Treatment: Gait belt Activity Tolerance: Patient tolerated treatment well Patient left: in bed;with call bell/phone within reach;with bed  alarm set;with family/visitor present Nurse Communication: Mobility status PT Visit Diagnosis: Unsteadiness on feet (R26.81);Other abnormalities of gait and mobility (R26.89);History of falling (Z91.81);Muscle weakness (generalized) (M62.81);Pain Pain - Right/Left: Right Pain - part of body: Knee    Time: 1112-1129 PT Time Calculation (min) (ACUTE ONLY): 17 min   Charges:   PT Evaluation $PT Eval Low Complexity: 1 Low   PT General Charges $$ ACUTE PT VISIT: 1 Visit         [name], PT,  DPT [facility]  Rehabilitation Services Physical Therapist Office: [phone] Website: [email or web address]   Berton Mount 09/28/2023, 1:02 PM

## 2023-09-28 NOTE — Progress Notes (Signed)
Transition of Care [facility]) - Inpatient Brief Assessment   Patient Details  Name: Lynn [name] MRN: [id number] Date of Birth: May 22, 1946  Transition of Care [facility]) CM/SW Contact:    Janae Bridgeman, RN Phone Number: 09/28/2023, 2:45 PM   Clinical Narrative: Cm met with the patient at the bedside to discuss TOC needs.  The patient refused SNF placement and wants to return home.  Family is present at the bedside.  The patient lives alone and plans to return to home tomorrow.  Medicare choice was offered to the patient and the patient did not have a preference.  I called Zollie Scale and Kandee Keen, RNCM accepted for [facility] services.  Orders placed - MD signed.  The patient was offered choice regarding DME and she did not have a preference.  I called [employer] and [employer] plans to deliver RW to the bedside today.  Pending discharge to home tomorrow by car.   Transition of Care Asessment: Insurance and Status: (P) Insurance coverage has been reviewed Patient has primary care physician: (P) Yes Home environment has been reviewed: (P) From home alone   Prior/Current Home Services: (P) No current home services Social Determinants of Health Reivew: (P) SDOH reviewed interventions complete Readmission risk has been reviewed: (P) Yes Transition of care needs: (P) transition of care needs identified, TOC will continue to follow

## 2023-09-28 NOTE — Progress Notes (Signed)
Mobility Specialist Progress Note:    09/28/23 1545  Mobility  Activity Ambulated with assistance in hallway  Level of Assistance Contact guard assist, steadying assist  Assistive Device Front wheel walker  Distance Ambulated (ft) 500 ft  Activity Response Tolerated well  Mobility Referral Yes  $Mobility charge 1 Mobility  Mobility Specialist Start Time (ACUTE ONLY) 1525  Mobility Specialist Stop Time (ACUTE ONLY) 1535  Mobility Specialist Time Calculation (min) (ACUTE ONLY) 10 min   Pt received ambulating with RW. Pt c/o sciatic pain during session. Returned pt to room, family at bedside, all needs met.   Feliciana Rossetti Mobility Specialist Please contact via Special educational needs teacher or  Rehab office at [phone]

## 2023-09-28 NOTE — Progress Notes (Signed)
PROGRESS NOTE  Fidela [place]  DOB: 20-Aug-1946  PCP: [name], NP YQM:[id number]  DOA: 09/26/2023  LOS: 1 day  Hospital Day: 3  Brief narrative: Lynn [name] is a 77 y.o. female with PMH significant for DM2, HTN, HLD, CKD, depression, cognitive impairment 11/20, patient was brought to the ED from home after she was found down on the floor.  Last known normal was 24 hours prior.  Unclear how long she was on the floor for.  Patient was not to recall the circumstances of the fall. In the ED, patient was afebrile, blood pressure 160s, breathing on room air Initial labs with WBC count 13, hemoglobin 17.4, BUN/creatinine 24/1.06, CK elevated to 606,  lactic acid elevated to 3.3>1.1 Urinalysis showed clear amber color urine with negative leukocytes, negative nitrate CT head did not show any acute intracranial process.  It showed atrophy with chronic microvascular ischemic changes. CT medical spine showed multilevel degenerative changes in the cervical spine without evidence of acute fracture. CT lumbar spine showed advanced osteopenia continues to have any traumatic fracture CT pelvis showed sigmoid diverticulosis Admitted to [facility]  Subjective: Patient was seen and examined this morning. Lying in bed.  More awake than yesterday.  Able to have conversation.  POA at bedside.  Seen by PT/OT.  Recommend SNF but patient wants to go home rather.  Assessment and plan: Unwitnessed fall  Impaired mobility found on the floor, unknown soft stance leading to fall, unknown duration of downtime No prior history of fall, syncope Skeletal survey without evidence of fracture Continue pain management.  PT eval obtained.  Recommend SNF  Lactic acidosis Rhabdomyolysis Elevated lactic acid and elevated CK level likely due to dehydration and rhabdomyolysis Lactic acid and CK level improved Continue maintenance fluid Recent Labs  Lab 09/26/23 2332 09/27/23 0206  LATICACIDVEN 3.3* 1.1    Recent Labs  Lab 09/26/23 1823 09/27/23 0555  CKTOTAL 606* 397*    Hypotension H/o hypertension Blood pressure running in 80s this morning but has no tachycardia or other symptoms  PTA meds- losartan 25 mg daily.  It was held.  Patient was given IV hydration.  Blood pressure is running normal today.  Losartan remains on hold.  Lower back pain Secondary to fall.  CT lumbar spine rule out fracture. Probably had muscle strain, spasm. Pain management with scheduled Tylenol, as needed oxycodone and as needed muscle relaxants  Cognitive impairment H/o depression Per report, patient has lately been showing signs of dementia.  She also has history of depression.  She was waiting for neurology eval as an outpatient. PTA meds- Celexa 20 mg daily, Ambien 5 mg nightly Continue Celexa.  Continue Ambien as needed.  Type 2 diabetes mellitus A1c 6.3 on September 2024 PTA meds-metformin 500 mg daily Currently on SSI/Accu-Cheks Recent Labs  Lab 09/27/23 0806  GLUCAP 90   Elevated T Bili  Likely due to dehydration.  Continue to monitor Recent Labs  Lab 09/26/23 1823 09/27/23 0555  AST 37 30  ALT 21 19  ALKPHOS 81 69  BILITOT 1.6* 1.9*  PROT 5.9* 5.0*  ALBUMIN 3.4* 2.9*    Hyperlipidemia PTA on Lipitor.  Currently on hold because of elevated CK  Goals of care   Code Status: Limited: Do not attempt resuscitation (DNR) -DNR-LIMITED -Do Not Intubate/DNI      DVT prophylaxis:  enoxaparin (LOVENOX) injection 40 mg Start: 09/27/23 1000   Antimicrobials: None Fluid: Can stop IV fluid today Consultants: None Family Communication: None at bedside  Status: Observation Level of care:  Med-Surg   Patient is from: Home Needs to continue in-hospital care: SNF recommend PT. Anticipated d/c to: At the time of my evaluation this morning, POA/niece was at bedside. Patient does not want to go to SNF. Wants to go home tomorrow.. I did face-to-face for home health PT/OT. If able to  arrange, can add other services. Lives alone. POA lives 20 miles away.    Diet:  Diet Order             Diet regular Room service appropriate? Yes; Fluid consistency: Thin  Diet effective now                   Scheduled Meds:  acetaminophen  1,000 mg Oral TID   budesonide  0.25 mg Inhalation BID   citalopram  20 mg Oral Daily   enoxaparin (LOVENOX) injection  40 mg Subcutaneous Q24H   fluticasone  1 spray Each Nare Daily   loratadine  10 mg Oral Daily   montelukast  10 mg Oral QHS    PRN meds: albuterol, cyclobenzaprine, oxyCODONE, polyethylene glycol, zolpidem   Infusions:     Antimicrobials: Anti-infectives (From admission, onward)    None       Objective: Vitals:   09/28/23 0300 09/28/23 0732  BP: 104/62 121/64  Pulse: 67 77  Resp: 18 18  Temp: 98.2 F (36.8 C) 98 F (36.7 C)  SpO2: 95% 95%   No intake or output data in the 24 hours ending 09/28/23 1411  Filed Weights   09/26/23 2352  Weight: 88.5 kg   Weight change:  Body mass index is 30.54 kg/m.   Physical Exam: General exam: Pleasant elderly Caucasian female.  Lying on bed.  Not in distress Skin: No rashes, lesions or ulcers. HEENT: Atraumatic, normocephalic, no obvious bleeding Lungs: Clear to auscultation bilaterally CVS: Regular rate and rhythm, no murmur GI/Abd soft, nontender, nondistended, bowels are present CNS: Alert, awake, oriented to place and person Psychiatry: Mood appropriate. Extremities: No pedal edema, no calf tenderness  Data Review: I have personally reviewed the laboratory data and studies available.  F/u labs ordered Unresulted Labs (From admission, onward)     Start     Ordered   09/29/23 0500  Basic metabolic panel  Tomorrow morning,   R       Question:  Specimen collection method  Answer:  Lab=Lab collect   09/28/23 0951   09/29/23 0500  CBC with Differential/Platelet  Tomorrow morning,   R       Question:  Specimen collection method  Answer:  Lab=Lab  collect   09/28/23 0951   09/29/23 0500  Magnesium  Tomorrow morning,   R       Question:  Specimen collection method  Answer:  Lab=Lab collect   09/28/23 0951   09/29/23 0500  Phosphorus  Tomorrow morning,   R       Question:  Specimen collection method  Answer:  Lab=Lab collect   09/28/23 0951            Total time spent in review of labs and imaging, patient evaluation, formulation of plan, documentation and communication with family: 45 minutes  Signed, Lorin Glass, MD [facility] 09/28/2023

## 2023-09-29 DIAGNOSIS — W1830XA Fall on same level, unspecified, initial encounter: Secondary | ICD-10-CM | POA: Diagnosis not present

## 2023-09-29 DIAGNOSIS — E872 Acidosis, unspecified: Secondary | ICD-10-CM | POA: Diagnosis not present

## 2023-09-29 DIAGNOSIS — I959 Hypotension, unspecified: Secondary | ICD-10-CM | POA: Diagnosis not present

## 2023-09-29 DIAGNOSIS — E860 Dehydration: Secondary | ICD-10-CM | POA: Diagnosis not present

## 2023-09-29 LAB — CBC WITH DIFFERENTIAL/PLATELET
Abs Immature Granulocytes: 0.02 10*3/uL (ref 0.00–0.07)
Basophils Absolute: 0 10*3/uL (ref 0.0–0.1)
Basophils Relative: 1 %
Eosinophils Absolute: 0.2 10*3/uL (ref 0.0–0.5)
Eosinophils Relative: 4 %
HCT: 42.6 % (ref 36.0–46.0)
Hemoglobin: 13.9 g/dL (ref 12.0–15.0)
Immature Granulocytes: 0 %
Lymphocytes Relative: 31 %
Lymphs Abs: 1.6 10*3/uL (ref 0.7–4.0)
MCH: 29 pg (ref 26.0–34.0)
MCHC: 32.6 g/dL (ref 30.0–36.0)
MCV: 88.8 fL (ref 80.0–100.0)
Monocytes Absolute: 0.5 10*3/uL (ref 0.1–1.0)
Monocytes Relative: 10 %
Neutro Abs: 2.8 10*3/uL (ref 1.7–7.7)
Neutrophils Relative %: 54 %
Platelets: 158 10*3/uL (ref 150–400)
RBC: 4.8 MIL/uL (ref 3.87–5.11)
RDW: 13.5 % (ref 11.5–15.5)
WBC: 5.2 10*3/uL (ref 4.0–10.5)
nRBC: 0 % (ref 0.0–0.2)

## 2023-09-29 LAB — BASIC METABOLIC PANEL
Anion gap: 8 (ref 5–15)
BUN: 22 mg/dL (ref 8–23)
CO2: 25 mmol/L (ref 22–32)
Calcium: 9.1 mg/dL (ref 8.9–10.3)
Chloride: 105 mmol/L (ref 98–111)
Creatinine, Ser: 0.98 mg/dL (ref 0.44–1.00)
GFR, Estimated: 59 mL/min — ABNORMAL LOW (ref >60)
Glucose, Bld: 116 mg/dL — ABNORMAL HIGH (ref 70–99)
Potassium: 3.4 mmol/L — ABNORMAL LOW (ref 3.5–5.1)
Sodium: 138 mmol/L (ref 135–145)

## 2023-09-29 LAB — MAGNESIUM: Magnesium: 1.9 mg/dL (ref 1.7–2.4)

## 2023-09-29 LAB — PHOSPHORUS: Phosphorus: 2.2 mg/dL — ABNORMAL LOW (ref 2.5–4.6)

## 2023-09-29 MED ORDER — POTASSIUM CHLORIDE CRYS ER 20 MEQ PO TBCR
40.0000 meq | EXTENDED_RELEASE_TABLET | Freq: Once | ORAL | Status: AC
  Administered 2023-09-29: 40 meq via ORAL
  Filled 2023-09-29: qty 2

## 2023-09-29 NOTE — Discharge Summary (Signed)
Physician Discharge Summary  TALER BOUTELL BJY:[id number] DOB: 04/09/1946 DOA: 09/26/2023  PCP: [name], NP  Admit date: 09/26/2023 Discharge date: 09/29/2023  Admitted From: Home Discharge disposition: Home with home health  Recommendations at discharge:  Follow-up with PCP in 1 to 2 weeks for repeat blood work   Brief narrative: Lynn [name] is a 77 y.o. female with PMH significant for DM2, HTN, HLD, CKD, depression, cognitive impairment 11/20, patient was brought to the ED from home after she was found down on the floor.  Last known normal was 24 hours prior.  Unclear how long she was on the floor for.  Patient was not to recall the circumstances of the fall. In the ED, patient was afebrile, blood pressure 160s, breathing on room air Initial labs with WBC count 13, hemoglobin 17.4, BUN/creatinine 24/1.06, CK elevated to 606,  lactic acid elevated to 3.3>1.1 Urinalysis showed clear amber color urine with negative leukocytes, negative nitrate CT head did not show any acute intracranial process.  It showed atrophy with chronic microvascular ischemic changes. CT medical spine showed multilevel degenerative changes in the cervical spine without evidence of acute fracture. CT lumbar spine showed advanced osteopenia continues to have any traumatic fracture CT pelvis showed sigmoid diverticulosis Admitted to [facility]  Subjective: Patient was seen and examined this morning. Lying on bed.  Not in distress.  Wants to go home today. Her cousin/POA Ms. Rochele Raring on the phone.  Updated.  Hospital coarse: Unwitnessed fall  Impaired mobility found on the floor, unknown soft stance leading to fall, unknown duration of downtime No prior history of fall, syncope Skeletal survey without evidence of fracture Continue pain management.  PT eval obtained.  Recommend SNF.  Patient made a choice to go home with home health services.  Lactic acidosis Rhabdomyolysis Elevated lactic acid  and elevated CK level likely due to dehydration and rhabdomyolysis Lactic acid and CK level improved Adequately hydrated  Hypotension H/o hypertension PTA meds- losartan 25 mg daily.  Blood pressure running was running in 80s and his losartan was held. Blood pressure improved with adequate hydration. Okay to resume losartan post discharge.  Lower back pain Secondary to fall.  CT lumbar spine rule out fracture. Probably had muscle strain, spasm. Pain management with scheduled Tylenol, as needed oxycodone and as needed muscle relaxants  Cognitive impairment H/o depression Per report, patient has lately been showing signs of dementia.  She also has history of depression.  She was waiting for neurology eval as an outpatient. PTA meds- Celexa 20 mg daily, Ambien 5 mg nightly Continue both  Type 2 diabetes mellitus A1c 6.3 on September 2024 PTA meds-metformin 500 mg daily.  Continue the same  Elevated T Bili  Likely due to dehydration.  Continue to monitor as an outpatient Recent Labs  Lab 09/26/23 1823 09/27/23 0555  AST 37 30  ALT 21 19  ALKPHOS 81 69  BILITOT 1.6* 1.9*  PROT 5.9* 5.0*  ALBUMIN 3.4* 2.9*    Hyperlipidemia Continue Lipitor  Goals of care   Code Status: Limited: Do not attempt resuscitation (DNR) -DNR-LIMITED -Do Not Intubate/DNI    Wounds:  - Incision 11/30/11 Other (Comment) Left (Active)  Date First Assessed/Time First Assessed: 11/30/11 1622   Location: Other (Comment)  Location Orientation: Left    Assessments 11/30/2011  4:39 PM 12/03/2011  8:00 AM  Dressing Type Adhesive bandage Silicone dressing  Dressing Clean;Dry;Intact Dry;Intact;Old drainage (marked)  Site / Wound Assessment -- Clean;Dry  No associated orders.    Discharge Exam:   Vitals:   09/28/23 1538 09/28/23 2008 09/29/23 0500 09/29/23 0733  BP: (!) 158/71 (!) 147/72 (!) 152/81 (!) 153/75  Pulse: 76 81 72 77  Resp:  16 18 18   Temp: 98 F (36.7 C) (!) 97.5 F (36.4 C) 98.4  F (36.9 C) 97.8 F (36.6 C)  TempSrc:  Oral Oral   SpO2: 98% 97% 94% 96%  Weight:      Height:        Body mass index is 30.54 kg/m.  General exam: Pleasant elderly Caucasian female.  Lying on bed.  Not in distress Skin: No rashes, lesions or ulcers. HEENT: Atraumatic, normocephalic, no obvious bleeding Lungs: Clear to auscultation bilaterally CVS: Regular rate and rhythm, no murmur GI/Abd soft, nontender, nondistended, bowels are present CNS: Alert, awake, oriented to place and person Psychiatry: Mood appropriate. Extremities: No pedal edema, no calf tenderness  Follow ups:    Follow-up Information     [facility], [facility] Follow up.   Specialty: Home Health Services Why: [facility] will provide you with home health services.  They will call you in the next 24-48 hours to set up services. Contact information: [address] [place] [address] [phone]         [facility], [facility] Follow up.   Why: [employer] will provide you with a RW before you go home. Contact information: [address] [place] [address] [phone]         [name], NP Follow up.   Specialty: Nurse Practitioner Contact information: [address] [place] [address] [phone]                 Discharge Instructions:   Discharge Instructions     Call MD for:  difficulty breathing, headache or visual disturbances   Complete by: As directed    Call MD for:  extreme fatigue   Complete by: As directed    Call MD for:  hives   Complete by: As directed    Call MD for:  persistant dizziness or light-headedness   Complete by: As directed    Call MD for:  persistant nausea and vomiting   Complete by: As directed    Call MD for:  severe uncontrolled pain   Complete by: As directed    Call MD for:  temperature >100.4   Complete by: As directed    Diet general   Complete by: As directed    Discharge instructions   Complete by: As directed     Recommendations at discharge:   Follow-up with PCP in 1 to 2 weeks for repeat blood work  General discharge instructions: Follow with Primary MD [name], NP in 7 days  Please request your PCP  to go over your hospital tests, procedures, radiology results at the follow up. Please get your medicines reviewed and adjusted.  Your PCP may decide to repeat certain labs or tests as needed. Do not drive, operate heavy machinery, perform activities at heights, swimming or participation in water activities or provide baby sitting services if your were admitted for syncope or siezures until you have seen by Primary MD or a Neurologist and advised to do so again. [place] Controlled Substance Reporting System database was reviewed. Do not drive, operate heavy machinery, perform activities at heights, swim, participate in water activities or provide baby-sitting services while on medications for pain, sleep and mood until your  outpatient physician has reevaluated you and advised to do so again.  You are strongly recommended to comply with the dose, frequency and duration of prescribed medications. Activity: As tolerated with Full fall precautions use walker/cane & assistance as needed Avoid using any recreational substances like cigarette, tobacco, alcohol, or non-prescribed drug. If you experience worsening of your admission symptoms, develop shortness of breath, life threatening emergency, suicidal or homicidal thoughts you must seek medical attention immediately by calling 911 or calling your MD immediately  if symptoms less severe. You must read complete instructions/literature along with all the possible adverse reactions/side effects for all the medicines you take and that have been prescribed to you. Take any new medicine only after you have completely understood and accepted all the possible adverse reactions/side effects.  Wear Seat belts while driving. You were cared for by a hospitalist  during your hospital stay. If you have any questions about your discharge medications or the care you received while you were in the hospital after you are discharged, you can call the unit and ask to speak with the hospitalist or the covering physician. Once you are discharged, your primary care physician will handle any further medical issues. Please note that NO REFILLS for any discharge medications will be authorized once you are discharged, as it is imperative that you return to your primary care physician (or establish a relationship with a primary care physician if you do not have one).   Increase activity slowly   Complete by: As directed        Discharge Medications:   Allergies as of 09/29/2023       Reactions   Morphine And Codeine Itching   Patient states that she can still take this medication it just makes her itch.        Medication List     TAKE these medications    Accu-Chek FastClix Lancets Misc USE TO TEST BLOOD SUGAR ONCE A DAY   Accu-Chek Guide w/Device Kit 1 Device by Does not apply route daily.   Accu-Chek SmartView test strip Generic drug: glucose blood TEST BLOOD SUGAR ONCE DAILY   acetaminophen 325 MG tablet Commonly known as: TYLENOL Take 650 mg by mouth every 6 (six) hours as needed for moderate pain.   albuterol 108 (90 Base) MCG/ACT inhaler Commonly known as: Proventil HFA Inhale 2 puffs into the lungs every 6 (six) hours as needed for wheezing or shortness of breath.   atorvastatin 40 MG tablet Commonly known as: LIPITOR Take 1 tablet (40 mg total) by mouth daily.   blood glucose meter kit and supplies Kit Test once daily for glucose control.  Dx E11.9   cetirizine 5 MG tablet Commonly known as: ZYRTEC Take 5 mg by mouth daily.   citalopram 20 MG tablet Commonly known as: CELEXA Take 1 tablet (20 mg) by mouth daily.   docusate sodium 100 MG capsule Commonly known as: COLACE Take 100 mg by mouth daily as needed. Reported on  10/21/2015   fluticasone 50 MCG/ACT nasal spray Commonly known as: FLONASE 1 spray up each nostril  at bedtime   ipratropium 0.03 % nasal spray Commonly known as: ATROVENT Place 2 sprays into both nostrils every 12 (twelve) hours.   losartan 25 MG tablet Commonly known as: COZAAR Take 1 tablet (25 mg total) by mouth daily.   metFORMIN 500 MG tablet Commonly known as: GLUCOPHAGE Take 1 tablet (500 mg total) by mouth daily with breakfast.   montelukast 10 MG tablet Commonly  known as: SINGULAIR Take 1 tablet (10 mg total) by mouth at bedtime.   OCUVITE PO Take 1 tablet by mouth daily.   Qvar RediHaler 40 MCG/ACT inhaler Generic drug: beclomethasone Inhale 1 puff into the lungs 2 (two) times daily.   zolpidem 5 MG tablet Commonly known as: AMBIEN Take 1 tablet (5 mg total) by mouth at bedtime as needed for sleep.               Durable Medical Equipment  (From admission, onward)           Start     Ordered   09/28/23 1437  For home use only DME Walker rolling  Once       Question Answer Comment  Walker: With 5 Inch Wheels   Patient needs a walker to treat with the following condition Generalized weakness      09/28/23 1436             The results of significant diagnostics from this hospitalization (including imaging, microbiology, ancillary and laboratory) are listed below for reference.    Procedures and Diagnostic Studies:   CT Head Wo Contrast  Result Date: 09/26/2023 CLINICAL DATA:  Fall, head and neck trauma. EXAM: CT HEAD WITHOUT CONTRAST CT CERVICAL SPINE WITHOUT CONTRAST TECHNIQUE: Multidetector CT imaging of the head and cervical spine was performed following the standard protocol without intravenous contrast. Multiplanar CT image reconstructions of the cervical spine were also generated. RADIATION DOSE REDUCTION: This exam was performed according to the departmental dose-optimization program which includes automated exposure control,  adjustment of the mA and/or kV according to patient size and/or use of iterative reconstruction technique. COMPARISON:  None available. FINDINGS: CT HEAD FINDINGS Brain: No acute intracranial hemorrhage, midline shift or mass effect. No extra-axial fluid collection. Diffuse atrophy is noted. Periventricular white matter hypodensities are present bilaterally. No hydrocephalus. Vascular: No hyperdense vessel or unexpected calcification. Skull: Normal. Negative for fracture or focal lesion. Sinuses/Orbits: No acute finding. Other: A scalp hematoma is present over the parietal bone on the left. CT CERVICAL SPINE FINDINGS Alignment: Normal. Skull base and vertebrae: No acute fracture. There is fusion of the C5-C6 vertebral bodies. Soft tissues and spinal canal: No prevertebral fluid or swelling. No visible canal hematoma. Disc levels: Multilevel intervertebral disc space narrowing, disc osteophyte complexes, and facet arthropathy. Upper chest: Emphysematous changes are present at the lung apices. Other: No acute abnormality. IMPRESSION: 1. No acute intracranial process. 2. Atrophy with chronic microvascular ischemic changes. 3. Multilevel degenerative changes in the cervical spine without evidence of acute fracture. Electronically Signed   By: Thornell Sartorius M.D.   On: 09/26/2023 20:38   CT Cervical Spine Wo Contrast  Result Date: 09/26/2023 CLINICAL DATA:  Fall, head and neck trauma. EXAM: CT HEAD WITHOUT CONTRAST CT CERVICAL SPINE WITHOUT CONTRAST TECHNIQUE: Multidetector CT imaging of the head and cervical spine was performed following the standard protocol without intravenous contrast. Multiplanar CT image reconstructions of the cervical spine were also generated. RADIATION DOSE REDUCTION: This exam was performed according to the departmental dose-optimization program which includes automated exposure control, adjustment of the mA and/or kV according to patient size and/or use of iterative reconstruction  technique. COMPARISON:  None available. FINDINGS: CT HEAD FINDINGS Brain: No acute intracranial hemorrhage, midline shift or mass effect. No extra-axial fluid collection. Diffuse atrophy is noted. Periventricular white matter hypodensities are present bilaterally. No hydrocephalus. Vascular: No hyperdense vessel or unexpected calcification. Skull: Normal. Negative for fracture or focal lesion. Sinuses/Orbits: No  acute finding. Other: A scalp hematoma is present over the parietal bone on the left. CT CERVICAL SPINE FINDINGS Alignment: Normal. Skull base and vertebrae: No acute fracture. There is fusion of the C5-C6 vertebral bodies. Soft tissues and spinal canal: No prevertebral fluid or swelling. No visible canal hematoma. Disc levels: Multilevel intervertebral disc space narrowing, disc osteophyte complexes, and facet arthropathy. Upper chest: Emphysematous changes are present at the lung apices. Other: No acute abnormality. IMPRESSION: 1. No acute intracranial process. 2. Atrophy with chronic microvascular ischemic changes. 3. Multilevel degenerative changes in the cervical spine without evidence of acute fracture. Electronically Signed   By: Thornell Sartorius M.D.   On: 09/26/2023 20:38   CT Lumbar Spine Wo Contrast  Result Date: 09/26/2023 CLINICAL DATA:  Back trauma.  Pain. EXAM: CT LUMBAR SPINE WITHOUT CONTRAST TECHNIQUE: Multidetector CT imaging of the lumbar spine was performed without intravenous contrast administration. Multiplanar CT image reconstructions were also generated. RADIATION DOSE REDUCTION: This exam was performed according to the departmental dose-optimization program which includes automated exposure control, adjustment of the mA and/or kV according to patient size and/or use of iterative reconstruction technique. COMPARISON:  None Available. FINDINGS: Segmentation: 5 lumbar type vertebrae. Alignment: Normal. Vertebrae: No acute fracture.  Advanced osteopenia. Paraspinal and other soft  tissues: Negative. Disc levels: No acute findings.  Degenerative changes. IMPRESSION: 1. No acute/traumatic lumbar spine pathology. 2. Advanced osteopenia. Electronically Signed   By: Elgie Collard M.D.   On: 09/26/2023 20:33   CT PELVIS WO CONTRAST  Result Date: 09/26/2023 CLINICAL DATA:  Fall, low back pain and SI joint pain EXAM: CT PELVIS WITHOUT CONTRAST TECHNIQUE: Multidetector CT imaging of the pelvis was performed following the standard protocol without intravenous contrast. RADIATION DOSE REDUCTION: This exam was performed according to the departmental dose-optimization program which includes automated exposure control, adjustment of the mA and/or kV according to patient size and/or use of iterative reconstruction technique. COMPARISON:  None Available. FINDINGS: Urinary Tract:  No abnormality visualized. Bowel:  Sigmoid diverticulosis.  Pelvic small bowel unremarkable. Vascular/Lymphatic: Atherosclerotic calcifications in the iliac vessels. No aneurysm or adenopathy. Reproductive:  Uterus and adnexa unremarkable.  No mass. Other:  No free fluid or free air. Musculoskeletal: No pelvic or proximal femoral fracture. Mild degenerative changes in the hips bilaterally with joint space narrowing and spurring. IMPRESSION: No acute bony abnormality. Sigmoid diverticulosis. 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 Electronically Signed   By: Charlett Nose M.D.   On: 09/26/2023 20:33   DG Chest 2 View  Result Date: 09/26/2023 CLINICAL DATA:  Unwitnessed fall. EXAM: CHEST - 2 VIEW COMPARISON:  October 31, 2013. FINDINGS: Stable cardiomediastinal silhouette. No acute pulmonary disease. Degenerative changes are seen involving both shoulder joints. IMPRESSION: No active cardiopulmonary disease. Electronically Signed   By: Lupita Raider M.D.   On: 09/26/2023 20:18     Labs:   Basic Metabolic Panel: Recent Labs  Lab 09/26/23 1823 09/27/23 0555 09/29/23 0431  NA 135 134* 138  K 3.7 3.1* 3.4*   CL 101 103 105  CO2 20* 23 25  GLUCOSE 130* 96 116*  BUN 24* 27* 22  CREATININE 1.06* 1.01* 0.98  CALCIUM 9.4 9.0 9.1  MG  --  1.9 1.9  PHOS  --  2.6 2.2*   GFR Estimated Creatinine Clearance: 54.9 mL/min (by C-G formula based on SCr of 0.98 mg/dL). Liver Function Tests: Recent Labs  Lab 09/26/23 1823 09/27/23 0555  AST 37 30  ALT 21 19  ALKPHOS 81  69  BILITOT 1.6* 1.9*  PROT 5.9* 5.0*  ALBUMIN 3.4* 2.9*   No results for input(s): "LIPASE", "AMYLASE" in the last 168 hours. No results for input(s): "AMMONIA" in the last 168 hours. Coagulation profile No results for input(s): "INR", "PROTIME" in the last 168 hours.  CBC: Recent Labs  Lab 09/26/23 1654 09/27/23 0555 09/29/23 0431  WBC 13.0* 10.0 5.2  NEUTROABS 10.8*  --  2.8  HGB 17.4* 14.2 13.9  HCT 55.4* 42.9 42.6  MCV 95.4 87.7 88.8  PLT 164 169 158   Cardiac Enzymes: Recent Labs  Lab 09/26/23 1823 09/27/23 0555  CKTOTAL 606* 397*   BNP: Invalid input(s): "POCBNP" CBG: Recent Labs  Lab 09/27/23 0806  GLUCAP 90   D-Dimer No results for input(s): "DDIMER" in the last 72 hours. Hgb A1c No results for input(s): "HGBA1C" in the last 72 hours. Lipid Profile No results for input(s): "CHOL", "HDL", "LDLCALC", "TRIG", "CHOLHDL", "LDLDIRECT" in the last 72 hours. Thyroid function studies No results for input(s): "TSH", "T4TOTAL", "T3FREE", "THYROIDAB" in the last 72 hours.  Invalid input(s): "FREET3" Anemia work up No results for input(s): "VITAMINB12", "FOLATE", "FERRITIN", "TIBC", "IRON", "RETICCTPCT" in the last 72 hours. Microbiology No results found for this or any previous visit (from the past 240 hour(s)).  Time coordinating discharge: 45 minutes  Signed: Caldonia Leap  [facility] 09/29/2023, 1:33 PM

## 2023-09-29 NOTE — Progress Notes (Signed)
Mobility Specialist Progress Note    09/29/23 1048  Mobility  Activity Ambulated with assistance to bathroom;Ambulated with assistance in hallway  Level of Assistance Minimal assist, patient does 75% or more  Assistive Device Front wheel walker  Distance Ambulated (ft) 500 ft  Activity Response Tolerated well  Mobility Referral Yes  $Mobility charge 1 Mobility  Mobility Specialist Start Time (ACUTE ONLY) 1009  Mobility Specialist Stop Time (ACUTE ONLY) 1047  Mobility Specialist Time Calculation (min) (ACUTE ONLY) 38 min   Pt received sitting EOB asking questions about medicines in her pill cup. RN present to re-explain medication to pt. Pt agreeable to session. Pt voided in bathroom. C/o R knee pain referring to it as "that knee that won't bend". Returned to chair with call bell in reach and alarm on. RN aware.   [place] Mobility Specialist  Please Neurosurgeon or Rehab Office at [phone]

## 2023-09-29 NOTE — TOC Transition Note (Signed)
Transition of Care [facility]) - CM/SW Discharge Note   Patient Details  Name: Lynn [name] MRN: [id number] Date of Birth: 05/08/1946  Transition of Care [facility]) CM/SW Contact:  Lawerance Sabal, RN Phone Number: 09/29/2023, 1:43 PM   Clinical Narrative:     Notified HH agency of DC today Family to transport home   Final next level of care: Home w Home Health Services Barriers to Discharge: No Barriers Identified   Patient Goals and CMS Choice      Discharge Placement                         Discharge Plan and Services Additional resources added to the After Visit Summary for                              [facility] Agency: [facility] Date [facility] Agency Contacted: 09/29/23 Time HH Agency Contacted: 1342 Representative spoke with at [facility] Agency: Kandee Keen  Social Determinants of Health (SDOH) Interventions SDOH Screenings   Food Insecurity: No Food Insecurity (09/27/2023)  Housing: Low Risk  (09/27/2023)  Transportation Needs: No Transportation Needs (09/27/2023)  Utilities: Not At Risk (09/27/2023)  Alcohol Screen: Low Risk  (08/07/2022)  Depression (PHQ2-9): Low Risk  (07/19/2023)  Financial Resource Strain: Low Risk  (08/07/2022)  Physical Activity: Inactive (08/07/2022)  Social Connections: Unknown (08/07/2022)  Stress: No Stress Concern Present (08/07/2022)  Tobacco Use: Medium Risk (09/26/2023)     Readmission Risk Interventions    09/28/2023    2:44 PM  Readmission Risk Prevention Plan  Post Dischage Appt Complete  Medication Screening Complete  Transportation Screening Complete

## 2023-10-01 ENCOUNTER — Telehealth: Payer: Self-pay | Admitting: *Deleted

## 2023-10-01 NOTE — Transitions of Care (Post Inpatient/ED Visit) (Signed)
10/01/2023  Name: Lynn [name] MRN: [id number] DOB: 1945/11/08  Today's TOC FU Call Status: Today's TOC FU Call Status:: Successful TOC FU Call Completed TOC FU Call Complete Date: 10/01/23 Patient's Name and Date of Birth confirmed.  Transition Care Management Follow-up Telephone Call Date of Discharge: 09/29/23 Discharge Facility: [facility]) Type of Discharge: Inpatient Admission Primary Inpatient Discharge Diagnosis:: unwitnessed mechanical fall How have you been since you were released from the hospital?: Better (caregiver/ POA/ cousin Lynn [name]: "I am on my way over there now to meet the home health nurse.  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  She refuses most all of the care that is offered to her.  I make sure she has what she needs as best as I can") Any questions or concerns?: No  Items Reviewed: Did you receive and understand the discharge instructions provided?: Yes (briefly reviewed with patient's caregiver who verbalizes good understanding of same - caregiver is not yet at patient's home: is on her way there now to meet home health team) Medications obtained,verified, and reconciled?: No Medications Not Reviewed Reasons:: Other: (Caregiver reports patient refuses repeatedly to allow anyone to be involved with her medication management; caregiver reports she has no information about patient's medications since patient repeatedly refuses help) Any new allergies since your discharge?: No Dietary orders reviewed?: Yes Type of Diet Ordered:: "As healthy as possible" Do you have support at home?: Yes People in Home: alone Name of Support/Comfort Primary Source: Caregiver reports patient resides alone/ is essentially independent in self-care activities; caregiver- POA Lynn [name] is her cousin- assists as/ if needed/ indicated: reports goes to patient's home 1-2 times per week; reports patient repeatedly declines help from family and from care providers  Medications  Reviewed Today: Medications Reviewed Today     Reviewed by Michaela Corner, RN (Registered Nurse) on 10/01/23 at 1303  Med List Status: <None>   Medication Order Taking? 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 [provider] Past Week Active Self, Pharmacy Records  albuterol (PROVENTIL HFA) 108 (90 Base) MCG/ACT inhaler [id number] No Inhale 2 puffs into the lungs every 6 (six) hours as needed for wheezing or shortness of breath. Lynn Paddy, NP Unk Active Self, Pharmacy Records  atorvastatin (LIPITOR) 40 MG tablet [id number] No Take 1 tablet (40 mg total) by mouth daily. Lynn Paddy, NP 09/25/2023 Active Self, Pharmacy Records  beclomethasone (QVAR REDIHALER) 40 MCG/ACT inhaler [id number] No Inhale 1 puff into the lungs 2 (two) times daily. Lynn Paddy, NP Unk Active Self, Pharmacy Records  blood glucose meter kit and supplies KIT [id number] No Test once daily for glucose control.  Dx E11.9 Lynn Covey, MD Taking Active Self, Pharmacy  Records  Blood Glucose Monitoring Suppl (ACCU-CHEK GUIDE) w/Device KIT [id number] No 1 Device by Does not apply route daily. Ardith Dark, MD Taking Active Self, Pharmacy Records  cetirizine (ZYRTEC) 5 MG tablet  [id number] No Take 5 mg by mouth daily. [provider] Past Week Active Self, Pharmacy Records  citalopram (CELEXA) 20 MG tablet [id number] No Take 1 tablet (20 mg) by mouth daily. Lynn Paddy, NP Past Week Active Self, Pharmacy Records  docusate sodium (COLACE) 100 MG capsule [id number] No Take 100 mg by mouth daily as needed. Reported on 10/21/2015 [provider] Past Week Active Self, Pharmacy Records  fluticasone [facility]) 50 MCG/ACT nasal spray [id number] No 1 spray up each nostril  at bedtime Lynn Pee, MD Unk Active Self, Pharmacy Records  ipratropium (ATROVENT) 0.03 % nasal spray [id number] No Place 2 sprays into both nostrils every 12 (twelve) hours. Lynn Paddy, NP Unk Active Self, Pharmacy Records  losartan (COZAAR) 25 MG tablet [id number] No Take 1 tablet (25 mg total) by mouth daily. Lynn Paddy, NP Past Week Active Self, Pharmacy Records  metFORMIN (GLUCOPHAGE) 500 MG tablet [id number] No Take 1 tablet (500 mg total) by mouth daily with breakfast. Lynn Paddy, NP Past Week Active Self, Pharmacy Records  montelukast (SINGULAIR) 10 MG tablet [id number] No Take 1 tablet (10 mg total) by mouth at bedtime. Lynn Paddy, NP Past Week Active Self, Pharmacy Records  Multiple Vitamins-Minerals (OCUVITE PO) [id number] No Take 1 tablet by mouth daily.  [provider] Past Week Active Self, Pharmacy Records  zolpidem (AMBIEN) 5 MG tablet [id number] No Take 1 tablet (5 mg total) by mouth at bedtime as needed for sleep. Lynn Paddy, NP Past Week Active Self, Pharmacy Records           Home Care and Equipment/Supplies: Were Home Health Services Ordered?: Yes [facility] Agency[facility] Has Agency set up a time to come to your home?: Yes First Home Health Visit Date: 10/01/23 Any new equipment or medical supplies ordered?: Yes (rolling walker) Name of Medical supply agency?: [employer] Were you able to get the equipment/medical supplies?: Yes Do you have  any questions related to the use of the equipment/supplies?: No  Functional Questionnaire: Do you need assistance with bathing/showering or dressing?: No (per caregiver:  "She refuses all help from anyone; I would help her get cleaned up, but she refuses; I am not sure how often she bathes") Do you need assistance with meal preparation?: Yes (cousin makes sure patient has enough prepared food for each week; patient cooks using microwave only) Do you need assistance with eating?: No Do you have difficulty maintaining continence: No Do you need assistance with getting out of bed/getting out of a chair/moving?: No Do you have difficulty managing or taking your medications?: No (caregiver reports patient refuses any help/ assistance with medication management)  Follow up appointments reviewed: PCP Follow-up appointment confirmed?: Yes (care coordination outreach in real-time with scheduling care guide to successfully schedule hospital follow up PCP appointment with covering provider 10/10/23) Date of PCP follow-up appointment?: 10/10/23 Follow-up Provider: PCP- covering provider Hetty Blend NP [facility] Follow-up appointment confirmed?: NA Do you need transportation to your follow-up appointment?: No Do you understand care options if your condition(s) worsen?: Yes-patient verbalized understanding  SDOH Interventions Today    Flowsheet Row Most Recent Value  SDOH Interventions   Food Insecurity Interventions Intervention Not Indicated  [cousin/ POA reports she makes sure patient has food  for each week,  patient prepares her own meals using microwave]  Housing Interventions Intervention Not Indicated  Transportation Interventions Intervention Not Indicated  [POA/ caregiver/ cousin Lynn [name] provides all transportation for patient]  Utilities Interventions Intervention Not Indicated      POA/ caregiver/ cousin declines need for ongoing/ further care management/ coordination outreach;  declines enrollment in 30-day TOC program- reports she has home health services in place, "as long as patient will allow it;" reports patient declines assistance with any potential care needs/ medical advice on routine basis/ per patient baseline;   Additional TOC interventions: Discussed options around potential clinical social work referral- caregiver reports she has plans to discuss with home health team this afternoon-- but states, "Lynn [name] will probably refuse anything that is offered to her;" made caregiver aware that she can request additional assistance for potential clinical social work needs through her PCP office as well, if patient agrees Discussed/ provided education around determining competency of patient: caregiver reports that she has concerns around patient competency-- but readily admits that "on paper- she would be determined to be competent to make her own decisions; and 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

## 2023-10-10 ENCOUNTER — Inpatient Hospital Stay: Payer: Medicare Other | Admitting: Family Medicine

## 2023-10-26 ENCOUNTER — Telehealth (INDEPENDENT_AMBULATORY_CARE_PROVIDER_SITE_OTHER): Payer: Medicare Other | Admitting: Nurse Practitioner

## 2023-10-26 ENCOUNTER — Other Ambulatory Visit: Payer: Self-pay

## 2023-10-26 ENCOUNTER — Other Ambulatory Visit (HOSPITAL_COMMUNITY): Payer: Self-pay

## 2023-10-26 DIAGNOSIS — I152 Hypertension secondary to endocrine disorders: Secondary | ICD-10-CM

## 2023-10-26 DIAGNOSIS — E1159 Type 2 diabetes mellitus with other circulatory complications: Secondary | ICD-10-CM | POA: Diagnosis not present

## 2023-10-26 DIAGNOSIS — E785 Hyperlipidemia, unspecified: Secondary | ICD-10-CM

## 2023-10-26 DIAGNOSIS — G4700 Insomnia, unspecified: Secondary | ICD-10-CM | POA: Diagnosis not present

## 2023-10-26 DIAGNOSIS — F321 Major depressive disorder, single episode, moderate: Secondary | ICD-10-CM | POA: Diagnosis not present

## 2023-10-26 DIAGNOSIS — Z7984 Long term (current) use of oral hypoglycemic drugs: Secondary | ICD-10-CM

## 2023-10-26 DIAGNOSIS — E1169 Type 2 diabetes mellitus with other specified complication: Secondary | ICD-10-CM | POA: Diagnosis not present

## 2023-10-26 MED ORDER — ZOLPIDEM TARTRATE 5 MG PO TABS
5.0000 mg | ORAL_TABLET | Freq: Every evening | ORAL | 0 refills | Status: DC | PRN
  Filled 2023-10-26: qty 30, 30d supply, fill #0

## 2023-10-26 MED ORDER — ATORVASTATIN CALCIUM 40 MG PO TABS
40.0000 mg | ORAL_TABLET | Freq: Every day | ORAL | 1 refills | Status: AC
  Filled 2023-10-26: qty 90, 90d supply, fill #0
  Filled 2024-07-17: qty 90, 90d supply, fill #1

## 2023-10-26 MED ORDER — CITALOPRAM HYDROBROMIDE 20 MG PO TABS
20.0000 mg | ORAL_TABLET | Freq: Every day | ORAL | 1 refills | Status: AC
  Filled 2023-10-26: qty 90, 90d supply, fill #0
  Filled 2024-07-17: qty 90, 90d supply, fill #1

## 2023-10-26 MED ORDER — METFORMIN HCL 500 MG PO TABS
500.0000 mg | ORAL_TABLET | Freq: Every day | ORAL | 1 refills | Status: DC
  Filled 2023-10-26: qty 90, 90d supply, fill #0
  Filled 2024-07-17: qty 90, 90d supply, fill #1

## 2023-10-26 MED ORDER — LOSARTAN POTASSIUM 25 MG PO TABS
25.0000 mg | ORAL_TABLET | Freq: Every day | ORAL | 1 refills | Status: DC
  Filled 2023-10-26: qty 90, 90d supply, fill #0
  Filled 2024-05-14: qty 90, 90d supply, fill #1

## 2023-10-26 NOTE — Assessment & Plan Note (Signed)
Chronic, stable Citalopram 20 mg tablet sent to pharmacy.  Patient will continue on 1 tablet by mouth daily.

## 2023-10-26 NOTE — Progress Notes (Addendum)
Established Patient Office Visit  An audio/visual tele-health visit was completed today for this patient. I connected with  Shauntavia Eckland Hoelzel on 10/26/23 utilizing audio/visual technology and verified that I am speaking with the correct person using two identifiers. The patient was located at their home, and I was located at home during the encounter. I discussed the limitations of evaluation and management by telemedicine. The patient expressed understanding and agreed to proceed.    Subjective   Patient ID: Lynn [name], female    DOB: July 03, 1946  Age: 77 y.o. MRN: [id number]  Chief Complaint  Patient presents with   Medical Management of Chronic Issues    3 month follow up, medication refill    Patient arrives today for follow-up via virtual point.  She is accompanied by her cousin who assists her with her medical needs.  Patient reports overall she is feeling well, she was hospitalized last month after having a fall at home when she tripped over a rug, and experiencing dehydration after being down on the floor for unknown amount of time.  She was treated with IV fluids and was discharged.  Since discharge she reports that the rug has been removed from her home, she denies any repeat falls.  She does use a walker, she also has a life alert that she reports she keeps nearby at all times.  She reports that she is tolerating her chronic medications and would like refills on these.     Review of Systems  Eyes:  Negative for blurred vision.  Respiratory:  Positive for wheezing (r/t asthma, stable). Negative for cough and shortness of breath.   Cardiovascular:  Negative for chest pain and palpitations.      Objective:     There were no vitals taken for this visit. BP Readings from Last 3 Encounters:  09/29/23 (!) 153/75  07/19/23 126/84  10/19/22 130/68   Wt Readings from Last 3 Encounters:  09/26/23 195 lb (88.5 kg)  07/19/23 195 lb 2 oz (88.5 kg)  10/19/22 187 lb 3.2 oz  (84.9 kg)      Physical Exam Comprehensive physical exam not completed today as office visit was conducted remotely.  Patient appears well over video, no acute distress noted.  Patient was alert and oriented, and appeared to have appropriate judgment.   No results found for any visits on 10/26/23.    The 10-year ASCVD risk score (Arnett DK, et al., 2019) is: 57.8%    Assessment & Plan:   Problem List Items Addressed This Visit       Cardiovascular and Mediastinum   HTN associated with DM (HCC) (Chronic)   Chronic, stable Patient does not have at home blood pressure readings available to review today.  Will refill losartan 25 mg tablets, she will take 1 tablet by mouth daily.  She will also continue on metformin 500 mg daily with first meal.  Last A1c 6.3.  She also is on atorvastatin which also was refilled today.  Last LDL 183.      Relevant Medications   losartan (COZAAR) 25 MG tablet   metFORMIN (GLUCOPHAGE) 500 MG tablet   atorvastatin (LIPITOR) 40 MG tablet     Endocrine   Dyslipidemia associated with T2DM (HCC) (Chronic)   Chronic, suboptimally controlled Last LDL 183 which is above goal.  Patient prefers to be on as little medication as possible, we have discussed risks associated with overmedicating under medicating.  She reports that she is accepting of risks and  that she is "old" and does not wish to further complicate her medical care at this time.  For now continue on atorvastatin 40 mg daily.  Refill sent to pharmacy today.  Consider checking lipid panel at next office visit.      Relevant Medications   losartan (COZAAR) 25 MG tablet   metFORMIN (GLUCOPHAGE) 500 MG tablet   atorvastatin (LIPITOR) 40 MG tablet     Other   Depression, major, single episode, moderate (HCC)   Chronic, stable Citalopram 20 mg tablet sent to pharmacy.  Patient will continue on 1 tablet by mouth daily.      Relevant Medications   citalopram (CELEXA) 20 MG tablet   Insomnia    Chronic Patient is unable to sleep without Ambien.  She has been on Ambien for many years.  Per [facility] controlled substance database has not had excessive prescriptions within the last month.  We did discuss risks of Ambien especially increased risks of falls, confusion, overall poor outcomes.  Patient reports that she is aware of these risks and would prefer to take the medication despite the risks.  Ambien 5 mg tablets sent to pharmacy she can take 1 tablet at bedtime as needed for sleep.      Relevant Medications   zolpidem (AMBIEN) 5 MG tablet    Return in about 3 months (around 01/24/2024) for F/U with Maralyn Sago.    [name], NP

## 2023-10-26 NOTE — Assessment & Plan Note (Signed)
Chronic, stable Patient does not have at home blood pressure readings available to review today.  Will refill losartan 25 mg tablets, she will take 1 tablet by mouth daily.  She will also continue on metformin 500 mg daily with first meal.  Last A1c 6.3.  She also is on atorvastatin which also was refilled today.  Last LDL 183.

## 2023-10-26 NOTE — Assessment & Plan Note (Signed)
Chronic Patient is unable to sleep without Ambien.  She has been on Ambien for many years.  Per [facility] controlled substance database has not had excessive prescriptions within the last month.  We did discuss risks of Ambien especially increased risks of falls, confusion, overall poor outcomes.  Patient reports that she is aware of these risks and would prefer to take the medication despite the risks.  Ambien 5 mg tablets sent to pharmacy she can take 1 tablet at bedtime as needed for sleep.

## 2023-10-26 NOTE — Assessment & Plan Note (Signed)
Chronic, suboptimally controlled Last LDL 183 which is above goal.  Patient prefers to be on as little medication as possible, we have discussed risks associated with overmedicating under medicating.  She reports that she is accepting of risks and that she is "old" and does not wish to further complicate her medical care at this time.  For now continue on atorvastatin 40 mg daily.  Refill sent to pharmacy today.  Consider checking lipid panel at next office visit.

## 2023-10-29 ENCOUNTER — Other Ambulatory Visit: Payer: Self-pay

## 2023-10-29 ENCOUNTER — Other Ambulatory Visit (HOSPITAL_COMMUNITY): Payer: Self-pay

## 2023-12-04 ENCOUNTER — Other Ambulatory Visit: Payer: Self-pay | Admitting: Nurse Practitioner

## 2023-12-04 DIAGNOSIS — G4700 Insomnia, unspecified: Secondary | ICD-10-CM

## 2023-12-05 ENCOUNTER — Other Ambulatory Visit (HOSPITAL_COMMUNITY): Payer: Self-pay

## 2023-12-05 MED ORDER — ZOLPIDEM TARTRATE 5 MG PO TABS
5.0000 mg | ORAL_TABLET | Freq: Every evening | ORAL | 0 refills | Status: DC | PRN
  Filled 2023-12-05: qty 30, 30d supply, fill #0

## 2023-12-10 ENCOUNTER — Other Ambulatory Visit (HOSPITAL_COMMUNITY): Payer: Self-pay

## 2024-01-28 ENCOUNTER — Other Ambulatory Visit: Payer: Self-pay | Admitting: Nurse Practitioner

## 2024-01-28 DIAGNOSIS — G4700 Insomnia, unspecified: Secondary | ICD-10-CM

## 2024-01-29 ENCOUNTER — Other Ambulatory Visit (HOSPITAL_COMMUNITY): Payer: Self-pay

## 2024-01-29 MED ORDER — ZOLPIDEM TARTRATE 5 MG PO TABS
5.0000 mg | ORAL_TABLET | Freq: Every evening | ORAL | 0 refills | Status: DC | PRN
  Filled 2024-01-29: qty 30, 30d supply, fill #0

## 2024-02-01 ENCOUNTER — Ambulatory Visit: Payer: Medicare Other | Admitting: Nurse Practitioner

## 2024-02-26 ENCOUNTER — Encounter: Payer: Self-pay | Admitting: Nurse Practitioner

## 2024-02-27 ENCOUNTER — Ambulatory Visit: Admitting: Nurse Practitioner

## 2024-03-06 ENCOUNTER — Other Ambulatory Visit: Payer: Self-pay | Admitting: Nurse Practitioner

## 2024-03-06 ENCOUNTER — Other Ambulatory Visit (HOSPITAL_COMMUNITY): Payer: Self-pay

## 2024-03-06 ENCOUNTER — Ambulatory Visit

## 2024-03-06 VITALS — Ht 67.0 in | Wt 195.0 lb

## 2024-03-06 DIAGNOSIS — E1159 Type 2 diabetes mellitus with other circulatory complications: Secondary | ICD-10-CM | POA: Diagnosis not present

## 2024-03-06 DIAGNOSIS — Z0000 Encounter for general adult medical examination without abnormal findings: Secondary | ICD-10-CM | POA: Diagnosis not present

## 2024-03-06 DIAGNOSIS — E119 Type 2 diabetes mellitus without complications: Secondary | ICD-10-CM

## 2024-03-06 DIAGNOSIS — I152 Hypertension secondary to endocrine disorders: Secondary | ICD-10-CM | POA: Diagnosis not present

## 2024-03-06 DIAGNOSIS — G4700 Insomnia, unspecified: Secondary | ICD-10-CM

## 2024-03-06 MED ORDER — ZOLPIDEM TARTRATE 5 MG PO TABS
5.0000 mg | ORAL_TABLET | Freq: Every evening | ORAL | 0 refills | Status: DC | PRN
  Filled 2024-03-06: qty 30, 30d supply, fill #0

## 2024-03-06 NOTE — Progress Notes (Signed)
 Subjective:   Lynn [name] is a 78 y.o. who presents for a Medicare Wellness preventive visit.  Visit Complete: Virtual I connected with  Lynn [name] on 03/06/24 by a audio enabled telemedicine application and verified that I am speaking with the correct person using two identifiers.  Patient Location: Home  Provider Location: Office/Clinic  I discussed the limitations of evaluation and management by telemedicine. The patient expressed understanding and agreed to proceed.  Vital Signs: Because this visit was a virtual/telehealth visit, some criteria may be missing or patient reported. Any vitals not documented were not able to be obtained and vitals that have been documented are patient reported.  VideoDeclined- This patient declined Librarian, academic. Therefore the visit was completed with audio only.  Persons Participating in Visit: Patient.  AWV Questionnaire: No: Patient Medicare AWV questionnaire was not completed prior to this visit.  Cardiac Risk Factors include: advanced age (>18men, >92 women);hypertension;diabetes mellitus;dyslipidemia;Other (see comment), Risk factor comments: CKD stage 2     Objective:    Today's Vitals   03/06/24 1431  Weight: 195 lb (88.5 kg)  Height: 5\' 7"  (1.702 m)   Body mass index is 30.54 kg/m.     03/06/2024    2:37 PM 09/27/2023    1:00 AM 09/26/2023    3:40 PM 08/07/2022    9:07 AM 06/15/2015   12:36 PM 12/08/2014    5:39 AM 04/08/2014    1:05 PM  Advanced Directives  Does Patient Have a Medical Advance Directive? Yes Yes Yes Yes No No Patient has advance directive, copy not in chart  Type of Advance Directive Healthcare Power of [place];Living will Living will  Healthcare Power of [place];Living will   Living will;Healthcare Power of Attorney  Does patient want to make changes to medical advance directive?  No - Patient declined  No - Patient declined     Copy of Healthcare Power of Attorney in  Chart? No - copy requested   No - copy requested     Would patient like information on creating a medical advance directive?     No - patient declined information No - patient declined information     Current Medications (verified) Outpatient Encounter Medications as of 03/06/2024  Medication Sig   Accu-Chek FastClix Lancets MISC USE TO TEST BLOOD SUGAR ONCE A DAY   ACCU-CHEK SMARTVIEW test strip TEST BLOOD SUGAR ONCE DAILY   acetaminophen  (TYLENOL ) 325 MG tablet Take 650 mg by mouth every 6 (six) hours as needed for moderate pain.   albuterol  (PROVENTIL  HFA) 108 (90 Base) MCG/ACT inhaler Inhale 2 puffs into the lungs every 6 (six) hours as needed for wheezing or shortness of breath.   atorvastatin  (LIPITOR) 40 MG tablet Take 1 tablet (40 mg total) by mouth daily.   beclomethasone (QVAR  REDIHALER) 40 MCG/ACT inhaler Inhale 1 puff into the lungs 2 (two) times daily.   blood glucose meter kit and supplies KIT Test once daily for glucose control.  Dx E11.9   Blood Glucose Monitoring Suppl (ACCU-CHEK GUIDE) w/Device KIT 1 Device by Does not apply route daily.   cetirizine (ZYRTEC) 5 MG tablet Take 5 mg by mouth daily.   citalopram  (CELEXA ) 20 MG tablet Take 1 tablet (20 mg) by mouth daily.   docusate sodium (COLACE) 100 MG capsule Take 100 mg by mouth daily as needed. Reported on 10/21/2015   fluticasone  (FLONASE ) 50 MCG/ACT nasal spray 1 spray up each nostril  at bedtime   ipratropium (  ATROVENT ) 0.03 % nasal spray Place 2 sprays into both nostrils every 12 (twelve) hours.   losartan  (COZAAR ) 25 MG tablet Take 1 tablet (25 mg total) by mouth daily.   metFORMIN  (GLUCOPHAGE ) 500 MG tablet Take 1 tablet (500 mg total) by mouth daily with breakfast.   montelukast  (SINGULAIR ) 10 MG tablet Take 1 tablet (10 mg total) by mouth at bedtime.   Multiple Vitamins-Minerals (OCUVITE PO) Take 1 tablet by mouth daily.    zolpidem  (AMBIEN ) 5 MG tablet Take 1 tablet (5 mg total) by mouth at bedtime as needed for  sleep.   No facility-administered encounter medications on file as of 03/06/2024.    Allergies (verified) Morphine and codeine   History: Past Medical History:  Diagnosis Date   ASTHMA 03/14/2007   NO PROBELEM IN 12 YRS   Blood dyscrasia    VON WILLIBRANDE "FREE BLEEDER"   COLONIC POLYPS, ADENOMATOUS, 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   DEGENERATION, MACULAR NOS 03/14/2007   DEPRESSION 03/14/2007   DIABETES MELLITUS, TYPE II 03/14/2007   Diarrhea 05/31/2007   DISC DISEASE, LUMBAR 05/25/2009   DIVERTICULOSIS OF COLON 01/11/2009   HYPERLIPIDEMIA 06/21/2010   HYPERTENSION 03/14/2007   Lung abnormality    LT LUNG WITH SPOT HAS BEEN FOLLOWED X 3 YRS DR BURNEY   SEBORRHEIC KERATOSIS, INFLAMED 12/02/2010   Unspecified hearing loss 01/13/2010   VERTIGO 11/10/2010   Past Surgical History:  Procedure Laterality Date   CHOLECYSTECTOMY  1982   urosespsis post op   JOINT REPLACEMENT  1998   1998 RT KNEE  +2009 REMOVED, REPLACED JOINT   KNEE ARTHROSCOPY Right 1995   right, TNR   left shoulder Left 2012   rotator cuff   SPINE SURGERY  1996   fusion, ruptured disk   TONSILLECTOMY  1952   Family History  Problem Relation Age of Onset   Cancer Mother    Diabetes Mother    Heart disease Mother    Hyperlipidemia Mother    Hypertension Mother    Hyperlipidemia Father    Hypertension Father    Colon cancer Maternal Uncle 33   Colon cancer Maternal Uncle 80   Heart disease Other    Diabetes Other        diabeties   Hyperlipidemia Other    Hypertension Other    Colon cancer Maternal Aunt 74   Social History   Socioeconomic History   Marital status: Single    Spouse name: Not on file   Number of children: Not on file   Years of education: Not on file   Highest education level: Not on file  Occupational History   Occupation: RETIRED  Tobacco Use   Smoking status: Former    Current packs/day: 0.00    Average packs/day: 2.0 packs/day for 37.0 years (74.0 ttl pk-yrs)    Types: Cigarettes    Start date:  09/18/1962    Quit date: 09/19/1999    Years since quitting: 24.4   Smokeless tobacco: Never  Vaping Use   Vaping status: Never Used  Substance and Sexual Activity   Alcohol use: Yes    Comment: rare   Drug use: No   Sexual activity: Not on file  Other Topics Concern   Not on file  Social History Narrative   Lives alone with one cat   Social Drivers of Health   Financial Resource Strain: Low Risk  (03/06/2024)   Overall Financial Resource Strain (CARDIA)    Difficulty of Paying Living Expenses: Not hard at all  Food Insecurity: No Food Insecurity (03/06/2024)   Hunger Vital Sign    Worried About Running Out of Food in the Last Year: Never true    Ran Out of Food in the Last Year: Never true  Transportation Needs: No Transportation Needs (03/06/2024)   PRAPARE - Administrator, Civil Service (Medical): No    Lack of Transportation (Non-Medical): No  Physical Activity: Inactive (03/06/2024)   Exercise Vital Sign    Days of Exercise per Week: 0 days    Minutes of Exercise per Session: 0 min  Stress: Stress Concern Present (03/06/2024)   [employer] of Occupational Health - Occupational Stress Questionnaire    Feeling of Stress : To some extent  Social Connections: Socially Isolated (03/06/2024)   Social Connection and Isolation Panel [NHANES]    Frequency of Communication with Friends and Family: More than three times a week    Frequency of Social Gatherings with Friends and Family: Three times a week    Attends Religious Services: Never    Active Member of Clubs or Organizations: No    Attends Banker Meetings: Never    Marital Status: Never married    Tobacco Counseling Counseling given: Not Answered    Clinical Intake:  Pre-visit preparation completed: Yes  Pain : No/denies pain     BMI - recorded: 30.54 Nutritional Status: BMI > 30  Obese Nutritional Risks: None Diabetes: Yes CBG done?: Yes (per pt-124) CBG resulted in Enter/ Edit  results?: No Did pt. bring in CBG monitor from home?: No  Lab Results  Component Value Date   HGBA1C 6.3 07/19/2023   HGBA1C 6.7 (H) 10/19/2022   HGBA1C 6.6 (H) 02/09/2022     How often do you need to have someone help you when you read instructions, pamphlets, or other written materials from your doctor or pharmacy?: 1 - Never  Interpreter Needed?: No  Information entered by :: Liticia Gasior, RMA   Activities of Daily Living     03/06/2024    2:31 PM 09/27/2023    1:00 AM  In your present state of health, do you have any difficulty performing the following activities:  Hearing? 0 1  Vision? 0 0  Difficulty concentrating or making decisions? 0 1  Walking or climbing stairs? 0   Dressing or bathing? 0   Doing errands, shopping? 0 0  Comment family drives   Quarry manager and eating ? N   Using the Toilet? N   In the past six months, have you accidently leaked urine? N   Do you have problems with loss of bowel control? N   Managing your Medications? N   Managing your Finances? N   Housekeeping or managing your Housekeeping? N     Patient Care Team: Zorita Hiss, NP as PCP - General (Nurse Practitioner) Buck Carbon, My [place], [place] as Referring Physician (Optometry)  Indicate any recent Medical Services you may have received from other than [facility] providers in the past year (date may be approximate).     Assessment:   This is a routine wellness examination for Lynn [name].  Hearing/Vision screen Hearing Screening - Comments:: Denies hearing difficulties   Vision Screening - Comments:: Wears eyeglasses/ [facility]   Goals Addressed               This Visit's Progress     Patient Stated (pt-stated)        To stay healthy       Depression Screen  03/06/2024    2:41 PM 10/26/2023    1:25 PM 07/19/2023    1:13 PM 10/19/2022   10:05 AM 08/07/2022    9:06 AM 03/16/2022    3:04 PM 02/09/2022    3:48 PM  PHQ 2/9 Scores  PHQ - 2 Score 0 0 0 0 0 1 0  PHQ- 9 Score 0     1      Fall Risk     03/06/2024    2:38 PM 10/26/2023    1:24 PM 07/19/2023    1:13 PM 10/19/2022   10:05 AM 08/07/2022    9:08 AM  Fall Risk   Falls in the past year? 0 1 0 0 0  Number falls in past yr: 0 0 0 0 0  Injury with Fall? 0 0 0 0 0  Risk for fall due to : Medication side effect History of fall(s) No Fall Risks No Fall Risks No Fall Risks  Follow up Falls prevention discussed;Falls evaluation completed Falls evaluation completed Falls evaluation completed Falls evaluation completed Falls evaluation completed    MEDICARE RISK AT HOME:  Medicare Risk at Home Any stairs in or around the home?: No Home free of loose throw rugs in walkways, pet beds, electrical cords, etc?: Yes Adequate lighting in your home to reduce risk of falls?: Yes Life alert?: Yes Use of a cane, walker or w/c?: No Grab bars in the bathroom?: Yes Shower chair or bench in shower?: No Elevated toilet seat or a handicapped toilet?: No  TIMED UP AND GO:  Was the test performed?  No  Cognitive Function: 6CIT completed        Immunizations Immunization History  Administered Date(s) Administered   Fluad Quad(high Dose 65+) 08/06/2020   Influenza Split 08/26/2012   Influenza Whole 11/06/2004, 08/11/2009   Influenza, High Dose Seasonal PF 07/28/2015, 07/24/2016, 07/27/2017, 07/26/2018, 08/07/2019   Influenza,inj,Quad PF,6+ Mos 07/29/2013, 07/06/2014   PFIZER(Purple Top)SARS-COV-2 Vaccination 12/11/2019, 01/05/2020, 03/14/2021   Pneumococcal Conjugate-13 04/06/2014   Pneumococcal Polysaccharide-23 11/06/2004, 04/02/2013   Td 11/06/2002, 06/21/2010   Tdap 06/15/2015   Zoster Recombinant(Shingrix) 08/07/2019   Zoster, Live 03/18/2008    Screening Tests Health Maintenance  Topic Date Due   Diabetic kidney evaluation - Urine ACR  04/03/2019   Zoster Vaccines- Shingrix (2 of 2) 10/02/2019   OPHTHALMOLOGY EXAM  12/20/2019   COVID-19 Vaccine (4 - 2024-25 season) 07/08/2023   Medicare Annual Wellness  (AWV)  08/05/2023   FOOT EXAM  10/20/2023   HEMOGLOBIN A1C  01/16/2024   INFLUENZA VACCINE  06/06/2024   Diabetic kidney evaluation - eGFR measurement  09/28/2024   DTaP/Tdap/Td (4 - Td or Tdap) 06/14/2025   Pneumonia Vaccine 20+ Years old  Completed   DEXA SCAN  Completed   Hepatitis C Screening  Completed   HPV VACCINES  Aged Out   Meningococcal B Vaccine  Aged Out   Colonoscopy  Discontinued    Health Maintenance  Health Maintenance Due  Topic Date Due   Diabetic kidney evaluation - Urine ACR  04/03/2019   Zoster Vaccines- Shingrix (2 of 2) 10/02/2019   OPHTHALMOLOGY EXAM  12/20/2019   COVID-19 Vaccine (4 - 2024-25 season) 07/08/2023   Medicare Annual Wellness (AWV)  08/05/2023   FOOT EXAM  10/20/2023   HEMOGLOBIN A1C  01/16/2024   Health Maintenance Items Addressed: A1C, UACR (Urine Albumin:Creatinine Ratio), See Nurse Notes  Additional Screening:  Vision Screening: Recommended annual ophthalmology exams for early detection of glaucoma and other disorders of  the eye.  Dental Screening: Recommended annual dental exams for proper oral hygiene  Community Resource Referral / Chronic Care Management: CRR required this visit?  No   CCM required this visit?  No     Plan:     I have personally reviewed and noted the following in the patient's chart:   Medical and social history Use of alcohol, tobacco or illicit drugs  Current medications and supplements including opioid prescriptions. Patient is not currently taking opioid prescriptions. Functional ability and status Nutritional status Physical activity Advanced directives List of other physicians Hospitalizations, surgeries, and ER visits in previous 12 months Vitals Screenings to include cognitive, depression, and falls Referrals and appointments  In addition, I have reviewed and discussed with patient certain preventive protocols, quality metrics, and best practice recommendations. A written personalized  care plan for preventive services as well as general preventive health recommendations were provided to patient.     Ricky Gallery L Jemia Fata, CMA   03/06/2024   After Visit Summary: (MyChart) Due to this being a telephonic visit, the after visit summary with patients personalized plan was offered to patient via MyChart   Notes: Please refer to Routing Comments.

## 2024-03-06 NOTE — Patient Instructions (Signed)
 Lynn [name] , Thank you for taking time to come for your Medicare Wellness Visit. I appreciate your ongoing commitment to your health goals. Please review the following plan we discussed and let me know if I can assist you in the future.   Referrals/Orders/Follow-Ups/Clinician Recommendations: It was nice talking with you today.  You are due for a foot exam, an A1C check and a kidney evaluation (by urine).  Aim for 30 minutes of exercise or brisk walking, 6-8 glasses of water, and 5 servings of fruits and vegetables each day.   This is a list of the screening recommended for you and due dates:  Health Maintenance  Topic Date Due   Yearly kidney health urinalysis for diabetes  04/03/2019   Zoster (Shingles) Vaccine (2 of 2) 10/02/2019   Eye exam for diabetics  12/20/2019   COVID-19 Vaccine (4 - 2024-25 season) 07/08/2023   Medicare Annual Wellness Visit  08/05/2023   Complete foot exam   10/20/2023   Hemoglobin A1C  01/16/2024   Flu Shot  06/06/2024   Yearly kidney function blood test for diabetes  09/28/2024   DTaP/Tdap/Td vaccine (4 - Td or Tdap) 06/14/2025   Pneumonia Vaccine  Completed   DEXA scan (bone density measurement)  Completed   Hepatitis C Screening  Completed   HPV Vaccine  Aged Out   Meningitis B Vaccine  Aged Out   Colon Cancer Screening  Discontinued    Advanced directives: (Copy Requested) Please bring a copy of your health care power of attorney and living will to the office to be added to your chart at your convenience. You can mail to [facility] [address] [place], [place] [address] or email to [email or web address]  Next Medicare Annual Wellness Visit scheduled for next year: Yes

## 2024-03-28 ENCOUNTER — Ambulatory Visit: Payer: Self-pay | Admitting: Nurse Practitioner

## 2024-03-28 ENCOUNTER — Other Ambulatory Visit: Payer: Self-pay | Admitting: Nurse Practitioner

## 2024-03-28 ENCOUNTER — Ambulatory Visit (INDEPENDENT_AMBULATORY_CARE_PROVIDER_SITE_OTHER): Admitting: Nurse Practitioner

## 2024-03-28 VITALS — BP 134/78 | HR 78 | Temp 98.3°F | Ht 67.0 in

## 2024-03-28 DIAGNOSIS — E785 Hyperlipidemia, unspecified: Secondary | ICD-10-CM | POA: Diagnosis not present

## 2024-03-28 DIAGNOSIS — F5101 Primary insomnia: Secondary | ICD-10-CM

## 2024-03-28 DIAGNOSIS — G4700 Insomnia, unspecified: Secondary | ICD-10-CM

## 2024-03-28 DIAGNOSIS — N182 Chronic kidney disease, stage 2 (mild): Secondary | ICD-10-CM

## 2024-03-28 DIAGNOSIS — Z7984 Long term (current) use of oral hypoglycemic drugs: Secondary | ICD-10-CM | POA: Diagnosis not present

## 2024-03-28 DIAGNOSIS — E1159 Type 2 diabetes mellitus with other circulatory complications: Secondary | ICD-10-CM

## 2024-03-28 DIAGNOSIS — I152 Hypertension secondary to endocrine disorders: Secondary | ICD-10-CM

## 2024-03-28 DIAGNOSIS — E1169 Type 2 diabetes mellitus with other specified complication: Secondary | ICD-10-CM

## 2024-03-28 LAB — CBC
HCT: 49.3 % — ABNORMAL HIGH (ref 36.0–46.0)
Hemoglobin: 16.2 g/dL — ABNORMAL HIGH (ref 12.0–15.0)
MCHC: 32.8 g/dL (ref 30.0–36.0)
MCV: 88.3 fl (ref 78.0–100.0)
Platelets: 154 10*3/uL (ref 150.0–400.0)
RBC: 5.58 Mil/uL — ABNORMAL HIGH (ref 3.87–5.11)
RDW: 14.3 % (ref 11.5–15.5)
WBC: 7 10*3/uL (ref 4.0–10.5)

## 2024-03-28 LAB — COMPREHENSIVE METABOLIC PANEL WITH GFR
ALT: 8 U/L (ref 0–35)
AST: 15 U/L (ref 0–37)
Albumin: 4.2 g/dL (ref 3.5–5.2)
Alkaline Phosphatase: 106 U/L (ref 39–117)
BUN: 14 mg/dL (ref 6–23)
CO2: 28 meq/L (ref 19–32)
Calcium: 9.7 mg/dL (ref 8.4–10.5)
Chloride: 99 meq/L (ref 96–112)
Creatinine, Ser: 0.97 mg/dL (ref 0.40–1.20)
GFR: 56.24 mL/min — ABNORMAL LOW (ref >60.00)
Glucose, Bld: 125 mg/dL — ABNORMAL HIGH (ref 70–99)
Potassium: 4.7 meq/L (ref 3.5–5.1)
Sodium: 136 meq/L (ref 135–145)
Total Bilirubin: 0.7 mg/dL (ref 0.2–1.2)
Total Protein: 6.6 g/dL (ref 6.0–8.3)

## 2024-03-28 LAB — MICROALBUMIN / CREATININE URINE RATIO
Creatinine,U: 87.8 mg/dL
Microalb Creat Ratio: 272.5 mg/g — ABNORMAL HIGH (ref 0.0–30.0)
Microalb, Ur: 23.9 mg/dL — ABNORMAL HIGH (ref 0.0–1.9)

## 2024-03-28 LAB — LIPID PANEL
Cholesterol: 258 mg/dL — ABNORMAL HIGH (ref 0–200)
HDL: 68.8 mg/dL (ref >39.00)
LDL Cholesterol: 167 mg/dL — ABNORMAL HIGH (ref 0–99)
NonHDL: 189
Total CHOL/HDL Ratio: 4
Triglycerides: 111 mg/dL (ref 0.0–149.0)
VLDL: 22.2 mg/dL (ref 0.0–40.0)

## 2024-03-28 LAB — TSH: TSH: 3.51 u[IU]/mL (ref 0.35–5.50)

## 2024-03-28 LAB — VITAMIN B12: Vitamin B-12: 249 pg/mL (ref 211–911)

## 2024-03-28 LAB — HEMOGLOBIN A1C: Hgb A1c MFr Bld: 6.5 % (ref 4.6–6.5)

## 2024-03-28 NOTE — Progress Notes (Signed)
 Established Patient Office Visit  Subjective   Patient ID: Lynn [name], female    DOB: [birth year]  Age: 78 y.o. MRN: [id number]  Chief Complaint  Patient presents with   Medical Management of Chronic Issues    Type 2 diabetes/hyperlipidemia/CKD/insomnia: Last A1c 6.3, last LDL 183.  Currently on atorvastatin  40 mg daily, losartan  25 mg daily, metformin  500 mg daily.  Takes Ambien  5 mg at bedtime as needed.  Overall reports feeling well and at her baseline.  Has no acute concerns today.  Is accompanied by her family member who is very supportive.  Of note, patient did have a fall about 6 months ago which resulted in hospitalization.  She denies any additional falls, she does live alone.  She does have a life alert that is acceptable to her but does not use it.    ROS: see HPI    Objective:     BP 134/78   Pulse 78   Temp 98.3 F (36.8 C) (Temporal)   Ht 5\' 7"  (1.702 m)   SpO2 99%   BMI 30.54 kg/m  BP Readings from Last 3 Encounters:  03/28/24 134/78  09/29/23 (!) 153/75  07/19/23 126/84   Wt Readings from Last 3 Encounters:  03/06/24 195 lb (88.5 kg)  09/26/23 195 lb (88.5 kg)  07/19/23 195 lb 2 oz (88.5 kg)      Physical Exam Vitals reviewed.  Constitutional:      General: She is not in acute distress.    Appearance: Normal appearance.  HENT:     Head: Normocephalic and atraumatic.  Neck:     Vascular: No carotid bruit.  Cardiovascular:     Rate and Rhythm: Normal rate and regular rhythm.     Pulses: Normal pulses.     Heart sounds: Normal heart sounds.  Pulmonary:     Effort: Pulmonary effort is normal.     Breath sounds: Normal breath sounds.  Skin:    General: Skin is warm and dry.  Neurological:     General: No focal deficit present.     Mental Status: She is alert and oriented to person, place, and time.  Psychiatric:        Mood and Affect: Mood normal.        Behavior: Behavior normal.        Judgment: Judgment normal.      No  results found for any visits on 03/28/24.  Last CBC Lab Results  Component Value Date   WBC 5.2 09/29/2023   HGB 13.9 09/29/2023   HCT 42.6 09/29/2023   MCV 88.8 09/29/2023   MCH 29.0 09/29/2023   RDW 13.5 09/29/2023   PLT 158 09/29/2023   Last metabolic panel Lab Results  Component Value Date   GLUCOSE 116 (H) 09/29/2023   NA 138 09/29/2023   K 3.4 (L) 09/29/2023   CL 105 09/29/2023   CO2 25 09/29/2023   BUN 22 09/29/2023   CREATININE 0.98 09/29/2023   GFRNONAA 59 (L) 09/29/2023   CALCIUM  9.1 09/29/2023   PHOS 2.2 (L) 09/29/2023   PROT 5.0 (L) 09/27/2023   ALBUMIN 2.9 (L) 09/27/2023   BILITOT 1.9 (H) 09/27/2023   ALKPHOS 69 09/27/2023   AST 30 09/27/2023   ALT 19 09/27/2023   ANIONGAP 8 09/29/2023   Last lipids Lab Results  Component Value Date   CHOL 286 (H) 07/19/2023   HDL 83.10 07/19/2023   LDLCALC 183 (H) 07/19/2023   LDLDIRECT 73.0 01/13/2020  TRIG 102.0 07/19/2023   CHOLHDL 3 07/19/2023   Last hemoglobin A1c Lab Results  Component Value Date   HGBA1C 6.3 07/19/2023      The 10-year ASCVD risk score (Arnett DK, et al., 2019) is: 48.3%    Assessment & Plan:   Problem List Items Addressed This Visit       Cardiovascular and Mediastinum   HTN associated with DM (HCC) - Primary (Chronic)   Chronic, stable Continue on losartan  25 mg daily and metformin  500 mg daily Check A1c and metabolic panel, further recommendations may be made based upon these results.      Relevant Orders   CBC   Comprehensive metabolic panel with GFR   Hemoglobin A1c   Lipid panel   TSH   Vitamin B12     Endocrine   Dyslipidemia associated with T2DM (HCC) (Chronic)   Chronic LDL not at goal Check repeat lipid panel today Continue atorvastatin  40 mg daily      Relevant Orders   CBC   Comprehensive metabolic panel with GFR   Hemoglobin A1c   Lipid panel   TSH   Vitamin B12     Genitourinary   CKD (chronic kidney disease) stage 2, GFR 60-89 ml/min    Chronic Check CBC, check urine for albuminuria Further recommendations may be made based upon results      Relevant Orders   CBC   Comprehensive metabolic panel with GFR   Hemoglobin A1c   Lipid panel   TSH   Microalbumin / creatinine urine ratio   Vitamin B12     Other   Insomnia   Chronic, stable, controlled Continue Ambien  5 mg by mouth at bedtime      Relevant Orders   CBC   Comprehensive metabolic panel with GFR   Hemoglobin A1c   Lipid panel   TSH   Vitamin B12    Return in about 6 months (around 09/28/2024) for F/U with Devaris Quirk - virtual.    Zorita Hiss, NP

## 2024-03-28 NOTE — Assessment & Plan Note (Signed)
 Chronic LDL not at goal Check repeat lipid panel today Continue atorvastatin  40 mg daily

## 2024-03-28 NOTE — Assessment & Plan Note (Addendum)
 Chronic Check CBC, check urine for albuminuria Further recommendations may be made based upon results

## 2024-03-28 NOTE — Assessment & Plan Note (Signed)
 Chronic, stable, controlled Continue Ambien  5 mg by mouth at bedtime

## 2024-03-28 NOTE — Assessment & Plan Note (Signed)
 Chronic, stable Continue on losartan  25 mg daily and metformin  500 mg daily Check A1c and metabolic panel, further recommendations may be made based upon these results.

## 2024-04-03 ENCOUNTER — Other Ambulatory Visit (HOSPITAL_COMMUNITY): Payer: Self-pay

## 2024-04-03 MED ORDER — ZOLPIDEM TARTRATE 5 MG PO TABS
5.0000 mg | ORAL_TABLET | Freq: Every evening | ORAL | 0 refills | Status: DC | PRN
  Filled 2024-04-03: qty 30, 30d supply, fill #0

## 2024-05-14 ENCOUNTER — Other Ambulatory Visit: Payer: Self-pay

## 2024-05-14 ENCOUNTER — Other Ambulatory Visit: Payer: Self-pay | Admitting: Nurse Practitioner

## 2024-05-14 DIAGNOSIS — G4700 Insomnia, unspecified: Secondary | ICD-10-CM

## 2024-05-14 MED ORDER — ZOLPIDEM TARTRATE 5 MG PO TABS
5.0000 mg | ORAL_TABLET | Freq: Every evening | ORAL | 0 refills | Status: DC | PRN
  Filled 2024-05-14: qty 30, 30d supply, fill #0

## 2024-05-15 ENCOUNTER — Other Ambulatory Visit (HOSPITAL_COMMUNITY): Payer: Self-pay

## 2024-05-16 ENCOUNTER — Other Ambulatory Visit (HOSPITAL_COMMUNITY): Payer: Self-pay

## 2024-06-10 ENCOUNTER — Other Ambulatory Visit: Payer: Self-pay | Admitting: Family

## 2024-06-10 DIAGNOSIS — G4700 Insomnia, unspecified: Secondary | ICD-10-CM

## 2024-06-18 ENCOUNTER — Other Ambulatory Visit: Payer: Self-pay | Admitting: Family

## 2024-06-18 ENCOUNTER — Other Ambulatory Visit: Payer: Self-pay | Admitting: Nurse Practitioner

## 2024-06-18 ENCOUNTER — Other Ambulatory Visit (HOSPITAL_COMMUNITY): Payer: Self-pay

## 2024-06-18 ENCOUNTER — Other Ambulatory Visit: Payer: Self-pay

## 2024-06-18 DIAGNOSIS — G4700 Insomnia, unspecified: Secondary | ICD-10-CM

## 2024-06-18 NOTE — Telephone Encounter (Unsigned)
 Copied from CRM #[id number]. Topic: Clinical - Medication Refill >> Jun 18, 2024  1:05 PM Henretta I wrote: Medication: zolpidem  (AMBIEN ) 5 MG tablet  Has the patient contacted their pharmacy? No  (Agent: If no, request that the patient contact the pharmacy for the refill. If patient does not wish to contact the pharmacy document the reason why and proceed with request.) (Agent: If yes, when and what did the pharmacy advise?)  This is the patient's preferred pharmacy:  [facility] [address] [place] [address] Phone: [phone] Fax: [phone]   Is this the correct pharmacy for this prescription? Yes If no, delete pharmacy and type the correct one.   Has the prescription been filled recently? No  Is the patient out of the medication? Yes  Has the patient been seen for an appointment in the last year OR does the patient have an upcoming appointment? Yes  Can we respond through MyChart? Yes  Agent: Please be advised that Rx refills may take up to 3 business days. We ask that you follow-up with your pharmacy.

## 2024-06-19 ENCOUNTER — Other Ambulatory Visit (HOSPITAL_COMMUNITY): Payer: Self-pay

## 2024-06-19 MED ORDER — ZOLPIDEM TARTRATE 5 MG PO TABS
5.0000 mg | ORAL_TABLET | Freq: Every evening | ORAL | 0 refills | Status: DC | PRN
  Filled 2024-06-19: qty 30, 30d supply, fill #0

## 2024-06-19 NOTE — Telephone Encounter (Signed)
 Copied from CRM [id number]. Topic: Clinical - Medication Question >> Jun 18, 2024  3:54 PM Harlene ORN wrote: Reason for CRM: Patient called to follow up on med request for ambien 

## 2024-07-17 ENCOUNTER — Other Ambulatory Visit: Payer: Self-pay | Admitting: Family

## 2024-07-17 ENCOUNTER — Other Ambulatory Visit: Payer: Self-pay

## 2024-07-17 DIAGNOSIS — G4700 Insomnia, unspecified: Secondary | ICD-10-CM

## 2024-07-18 ENCOUNTER — Other Ambulatory Visit (HOSPITAL_COMMUNITY): Payer: Self-pay

## 2024-07-22 ENCOUNTER — Other Ambulatory Visit: Payer: Self-pay | Admitting: Family

## 2024-07-22 ENCOUNTER — Other Ambulatory Visit (HOSPITAL_COMMUNITY): Payer: Self-pay

## 2024-07-22 ENCOUNTER — Telehealth: Payer: Self-pay | Admitting: Radiology

## 2024-07-22 DIAGNOSIS — G4700 Insomnia, unspecified: Secondary | ICD-10-CM

## 2024-07-22 NOTE — Telephone Encounter (Signed)
 Copied from CRM [id number]. Topic: General - Other >> Jul 21, 2024  1:40 PM Harlene ORN wrote: Reason for CRM: Cousin called to follow up on med refill status. Informed her the refill was sent to [facility] on 08/14 >> Jul 22, 2024 10:32 AM Franky GRADE wrote: Patient's cousin Zebedee is calling to follow up on the refill request for Rx #: [id number]  zolpidem  (AMBIEN ) 5 MG tablet [[id number]] , Patient is out of the medication and has not been able to sleep well. She would like to know if there is anyway to expedite.

## 2024-07-23 ENCOUNTER — Other Ambulatory Visit: Payer: Self-pay | Admitting: Internal Medicine

## 2024-07-23 ENCOUNTER — Other Ambulatory Visit (HOSPITAL_COMMUNITY): Payer: Self-pay

## 2024-07-23 ENCOUNTER — Telehealth: Payer: Self-pay

## 2024-07-23 DIAGNOSIS — G4700 Insomnia, unspecified: Secondary | ICD-10-CM

## 2024-07-23 MED ORDER — ZOLPIDEM TARTRATE 5 MG PO TABS
5.0000 mg | ORAL_TABLET | Freq: Every evening | ORAL | 0 refills | Status: DC | PRN
  Filled 2024-07-23: qty 30, 30d supply, fill #0

## 2024-07-23 NOTE — Telephone Encounter (Signed)
 Medication sent to pharmacy

## 2024-07-23 NOTE — Telephone Encounter (Signed)
 Copied from CRM [id number]. Topic: General - Other >> Jul 21, 2024  1:40 PM Harlene ORN wrote: Reason for CRM: Cousin called to follow up on med refill status. Informed her the refill was sent to [facility] on 08/14 >> Jul 23, 2024  1:03 PM Martinique E wrote: Patient's cousin, Zebedee, called in with concerns about patient not getting her Ambien  medication, she has called several times and really hoping this process could be expedited as she has been without this medication. Please call Zebedee back today with an update, if possible. >> Jul 22, 2024 10:32 AM Franky GRADE wrote: Patient's cousin Zebedee is calling to follow up on the refill request for Rx #: [id number]  zolpidem  (AMBIEN ) 5 MG tablet [[id number]] , Patient is out of the medication and has not been able to sleep well. She would like to know if there is anyway to expedite.

## 2024-07-24 NOTE — Telephone Encounter (Signed)
 Medication was send in on 07/23/24

## 2024-08-18 ENCOUNTER — Other Ambulatory Visit: Payer: Self-pay | Admitting: Internal Medicine

## 2024-08-18 DIAGNOSIS — G4700 Insomnia, unspecified: Secondary | ICD-10-CM

## 2024-08-21 ENCOUNTER — Other Ambulatory Visit (HOSPITAL_COMMUNITY): Payer: Self-pay

## 2024-08-21 MED ORDER — ZOLPIDEM TARTRATE 5 MG PO TABS
5.0000 mg | ORAL_TABLET | Freq: Every evening | ORAL | 0 refills | Status: DC | PRN
  Filled 2024-08-21: qty 30, 30d supply, fill #0

## 2024-08-23 ENCOUNTER — Other Ambulatory Visit (HOSPITAL_COMMUNITY): Payer: Self-pay

## 2024-09-15 ENCOUNTER — Other Ambulatory Visit: Payer: Self-pay | Admitting: Internal Medicine

## 2024-09-15 DIAGNOSIS — G4700 Insomnia, unspecified: Secondary | ICD-10-CM

## 2024-09-19 ENCOUNTER — Other Ambulatory Visit (HOSPITAL_COMMUNITY): Payer: Self-pay

## 2024-09-19 MED ORDER — ZOLPIDEM TARTRATE 5 MG PO TABS
5.0000 mg | ORAL_TABLET | Freq: Every evening | ORAL | 0 refills | Status: DC | PRN
  Filled 2024-09-19 – 2024-09-20 (×2): qty 30, 30d supply, fill #0
  Filled 2024-09-20: qty 14, 14d supply, fill #0
  Filled 2024-09-20: qty 16, 16d supply, fill #0

## 2024-09-20 ENCOUNTER — Other Ambulatory Visit (HOSPITAL_COMMUNITY): Payer: Self-pay

## 2024-09-21 ENCOUNTER — Encounter (HOSPITAL_COMMUNITY): Payer: Self-pay

## 2024-09-21 ENCOUNTER — Inpatient Hospital Stay (HOSPITAL_COMMUNITY)
Admission: EM | Admit: 2024-09-21 | Discharge: 2024-10-06 | DRG: 884 | Disposition: A | Discharge status: Skilled Nursing Facility | Attending: Internal Medicine | Admitting: Internal Medicine

## 2024-09-21 ENCOUNTER — Emergency Department (HOSPITAL_COMMUNITY)

## 2024-09-21 ENCOUNTER — Other Ambulatory Visit: Payer: Self-pay

## 2024-09-21 DIAGNOSIS — R4182 Altered mental status, unspecified: Principal | ICD-10-CM

## 2024-09-21 DIAGNOSIS — D72829 Elevated white blood cell count, unspecified: Secondary | ICD-10-CM | POA: Diagnosis present

## 2024-09-21 DIAGNOSIS — Z860100 Personal history of colon polyps, unspecified: Secondary | ICD-10-CM

## 2024-09-21 DIAGNOSIS — Z79899 Other long term (current) drug therapy: Secondary | ICD-10-CM

## 2024-09-21 DIAGNOSIS — Z9181 History of falling: Secondary | ICD-10-CM

## 2024-09-21 DIAGNOSIS — Z7984 Long term (current) use of oral hypoglycemic drugs: Secondary | ICD-10-CM

## 2024-09-21 DIAGNOSIS — Z96651 Presence of right artificial knee joint: Secondary | ICD-10-CM | POA: Diagnosis present

## 2024-09-21 DIAGNOSIS — F03918 Unspecified dementia, unspecified severity, with other behavioral disturbance: Principal | ICD-10-CM | POA: Diagnosis present

## 2024-09-21 DIAGNOSIS — M6282 Rhabdomyolysis: Secondary | ICD-10-CM | POA: Diagnosis present

## 2024-09-21 DIAGNOSIS — Z83438 Family history of other disorder of lipoprotein metabolism and other lipidemia: Secondary | ICD-10-CM

## 2024-09-21 DIAGNOSIS — I160 Hypertensive urgency: Secondary | ICD-10-CM | POA: Diagnosis not present

## 2024-09-21 DIAGNOSIS — I152 Hypertension secondary to endocrine disorders: Secondary | ICD-10-CM | POA: Diagnosis present

## 2024-09-21 DIAGNOSIS — W19XXXA Unspecified fall, initial encounter: Secondary | ICD-10-CM | POA: Diagnosis not present

## 2024-09-21 DIAGNOSIS — Z6831 Body mass index (BMI) 31.0-31.9, adult: Secondary | ICD-10-CM

## 2024-09-21 DIAGNOSIS — E66811 Obesity, class 1: Secondary | ICD-10-CM | POA: Diagnosis present

## 2024-09-21 DIAGNOSIS — I959 Hypotension, unspecified: Secondary | ICD-10-CM | POA: Diagnosis not present

## 2024-09-21 DIAGNOSIS — E1122 Type 2 diabetes mellitus with diabetic chronic kidney disease: Secondary | ICD-10-CM | POA: Diagnosis present

## 2024-09-21 DIAGNOSIS — Z91148 Patient's other noncompliance with medication regimen for other reason: Secondary | ICD-10-CM

## 2024-09-21 DIAGNOSIS — E1169 Type 2 diabetes mellitus with other specified complication: Secondary | ICD-10-CM | POA: Diagnosis present

## 2024-09-21 DIAGNOSIS — Z981 Arthrodesis status: Secondary | ICD-10-CM

## 2024-09-21 DIAGNOSIS — J4520 Mild intermittent asthma, uncomplicated: Secondary | ICD-10-CM | POA: Diagnosis present

## 2024-09-21 DIAGNOSIS — Z885 Allergy status to narcotic agent status: Secondary | ICD-10-CM

## 2024-09-21 DIAGNOSIS — F03911 Unspecified dementia, unspecified severity, with agitation: Secondary | ICD-10-CM | POA: Diagnosis present

## 2024-09-21 DIAGNOSIS — Z66 Do not resuscitate: Secondary | ICD-10-CM | POA: Diagnosis present

## 2024-09-21 DIAGNOSIS — Z888 Allergy status to other drugs, medicaments and biological substances status: Secondary | ICD-10-CM

## 2024-09-21 DIAGNOSIS — F32A Depression, unspecified: Secondary | ICD-10-CM | POA: Diagnosis present

## 2024-09-21 DIAGNOSIS — E860 Dehydration: Secondary | ICD-10-CM | POA: Diagnosis present

## 2024-09-21 DIAGNOSIS — N1831 Chronic kidney disease, stage 3a: Secondary | ICD-10-CM | POA: Diagnosis present

## 2024-09-21 DIAGNOSIS — E785 Hyperlipidemia, unspecified: Secondary | ICD-10-CM | POA: Diagnosis present

## 2024-09-21 DIAGNOSIS — M5031 Other cervical disc degeneration,  high cervical region: Secondary | ICD-10-CM | POA: Diagnosis present

## 2024-09-21 DIAGNOSIS — J45909 Unspecified asthma, uncomplicated: Secondary | ICD-10-CM | POA: Diagnosis present

## 2024-09-21 DIAGNOSIS — Z8249 Family history of ischemic heart disease and other diseases of the circulatory system: Secondary | ICD-10-CM

## 2024-09-21 DIAGNOSIS — Z800 Family history of malignant neoplasm of digestive organs: Secondary | ICD-10-CM

## 2024-09-21 DIAGNOSIS — E1159 Type 2 diabetes mellitus with other circulatory complications: Secondary | ICD-10-CM | POA: Diagnosis not present

## 2024-09-21 DIAGNOSIS — Z833 Family history of diabetes mellitus: Secondary | ICD-10-CM

## 2024-09-21 DIAGNOSIS — F0394 Unspecified dementia, unspecified severity, with anxiety: Secondary | ICD-10-CM | POA: Diagnosis present

## 2024-09-21 DIAGNOSIS — G4700 Insomnia, unspecified: Secondary | ICD-10-CM | POA: Diagnosis present

## 2024-09-21 DIAGNOSIS — E119 Type 2 diabetes mellitus without complications: Secondary | ICD-10-CM

## 2024-09-21 DIAGNOSIS — Z794 Long term (current) use of insulin: Secondary | ICD-10-CM

## 2024-09-21 DIAGNOSIS — F0393 Unspecified dementia, unspecified severity, with mood disturbance: Secondary | ICD-10-CM | POA: Diagnosis present

## 2024-09-21 DIAGNOSIS — Z515 Encounter for palliative care: Secondary | ICD-10-CM

## 2024-09-21 DIAGNOSIS — Y92009 Unspecified place in unspecified non-institutional (private) residence as the place of occurrence of the external cause: Secondary | ICD-10-CM | POA: Diagnosis not present

## 2024-09-21 DIAGNOSIS — Z7409 Other reduced mobility: Secondary | ICD-10-CM | POA: Diagnosis present

## 2024-09-21 DIAGNOSIS — Z9049 Acquired absence of other specified parts of digestive tract: Secondary | ICD-10-CM

## 2024-09-21 DIAGNOSIS — Z87891 Personal history of nicotine dependence: Secondary | ICD-10-CM

## 2024-09-21 LAB — CBC WITH DIFFERENTIAL/PLATELET
Abs Immature Granulocytes: 0.04 K/uL (ref 0.00–0.07)
Basophils Absolute: 0 K/uL (ref 0.0–0.1)
Basophils Relative: 0 %
Eosinophils Absolute: 0 K/uL (ref 0.0–0.5)
Eosinophils Relative: 0 %
HCT: 52.8 % — ABNORMAL HIGH (ref 36.0–46.0)
Hemoglobin: 17.8 g/dL — ABNORMAL HIGH (ref 12.0–15.0)
Immature Granulocytes: 0 %
Lymphocytes Relative: 8 %
Lymphs Abs: 1 K/uL (ref 0.7–4.0)
MCH: 30.1 pg (ref 26.0–34.0)
MCHC: 33.7 g/dL (ref 30.0–36.0)
MCV: 89.3 fL (ref 80.0–100.0)
Monocytes Absolute: 0.9 K/uL (ref 0.1–1.0)
Monocytes Relative: 7 %
Neutro Abs: 10.6 K/uL — ABNORMAL HIGH (ref 1.7–7.7)
Neutrophils Relative %: 85 %
Platelets: 203 K/uL (ref 150–400)
RBC: 5.91 MIL/uL — ABNORMAL HIGH (ref 3.87–5.11)
RDW: 13.2 % (ref 11.5–15.5)
WBC: 12.7 K/uL — ABNORMAL HIGH (ref 4.0–10.5)
nRBC: 0 % (ref 0.0–0.2)

## 2024-09-21 LAB — I-STAT CHEM 8, ED
BUN: 22 mg/dL (ref 8–23)
Calcium, Ion: 1.01 mmol/L — ABNORMAL LOW (ref 1.15–1.40)
Chloride: 104 mmol/L (ref 98–111)
Creatinine, Ser: 1 mg/dL (ref 0.44–1.00)
Glucose, Bld: 171 mg/dL — ABNORMAL HIGH (ref 70–99)
HCT: 53 % — ABNORMAL HIGH (ref 36.0–46.0)
Hemoglobin: 18 g/dL — ABNORMAL HIGH (ref 12.0–15.0)
Potassium: 4 mmol/L (ref 3.5–5.1)
Sodium: 134 mmol/L — ABNORMAL LOW (ref 135–145)
TCO2: 21 mmol/L — ABNORMAL LOW (ref 22–32)

## 2024-09-21 LAB — COMPREHENSIVE METABOLIC PANEL WITH GFR
ALT: 16 U/L (ref 0–44)
AST: 27 U/L (ref 15–41)
Albumin: 3.3 g/dL — ABNORMAL LOW (ref 3.5–5.0)
Alkaline Phosphatase: 93 U/L (ref 38–126)
Anion gap: 13 (ref 5–15)
BUN: 17 mg/dL (ref 8–23)
CO2: 23 mmol/L (ref 22–32)
Calcium: 9.7 mg/dL (ref 8.9–10.3)
Chloride: 100 mmol/L (ref 98–111)
Creatinine, Ser: 1.18 mg/dL — ABNORMAL HIGH (ref 0.44–1.00)
GFR, Estimated: 47 mL/min — ABNORMAL LOW (ref >60)
Glucose, Bld: 175 mg/dL — ABNORMAL HIGH (ref 70–99)
Potassium: 3.8 mmol/L (ref 3.5–5.1)
Sodium: 136 mmol/L (ref 135–145)
Total Bilirubin: 2.4 mg/dL — ABNORMAL HIGH (ref 0.0–1.2)
Total Protein: 6.3 g/dL — ABNORMAL LOW (ref 6.5–8.1)

## 2024-09-21 LAB — TROPONIN I (HIGH SENSITIVITY)
Troponin I (High Sensitivity): 35 ng/L — ABNORMAL HIGH (ref <18)
Troponin I (High Sensitivity): 31 ng/L — ABNORMAL HIGH

## 2024-09-21 LAB — CK: Total CK: 321 U/L — ABNORMAL HIGH (ref 38–234)

## 2024-09-21 LAB — ETHANOL: Alcohol, Ethyl (B): <15 mg/dL (ref <15)

## 2024-09-21 MED ORDER — ACETAMINOPHEN 325 MG PO TABS
650.0000 mg | ORAL_TABLET | Freq: Four times a day (QID) | ORAL | Status: DC | PRN
  Administered 2024-10-05: 650 mg via ORAL
  Filled 2024-09-21 (×2): qty 2

## 2024-09-21 MED ORDER — MELATONIN 3 MG PO TABS: 3.0000 mg | ORAL_TABLET | Freq: Every evening | ORAL | Status: DC | PRN

## 2024-09-21 MED ORDER — HYDRALAZINE HCL 20 MG/ML IJ SOLN: 10.0000 mg | INTRAMUSCULAR | Status: DC | PRN

## 2024-09-21 MED ORDER — ONDANSETRON HCL 4 MG PO TABS: 4.0000 mg | ORAL_TABLET | Freq: Four times a day (QID) | ORAL | Status: DC | PRN

## 2024-09-21 MED ORDER — SODIUM CHLORIDE 0.9% FLUSH
3.0000 mL | Freq: Two times a day (BID) | INTRAVENOUS | Status: DC
  Administered 2024-09-22 – 2024-09-26 (×9): 3 mL via INTRAVENOUS

## 2024-09-21 MED ORDER — CITALOPRAM HYDROBROMIDE 20 MG PO TABS
20.0000 mg | ORAL_TABLET | Freq: Every day | ORAL | Status: DC
  Administered 2024-09-22 – 2024-10-06 (×13): 20 mg via ORAL
  Filled 2024-09-21 (×15): qty 1
  Filled 2024-09-21: qty 2

## 2024-09-21 MED ORDER — SENNOSIDES-DOCUSATE SODIUM 8.6-50 MG PO TABS: 1.0000 | ORAL_TABLET | Freq: Every evening | ORAL | Status: DC | PRN

## 2024-09-21 MED ORDER — ACETAMINOPHEN 650 MG RE SUPP: 650.0000 mg | Freq: Four times a day (QID) | RECTAL | Status: DC | PRN

## 2024-09-21 MED ORDER — ENOXAPARIN SODIUM 40 MG/0.4ML IJ SOSY
40.0000 mg | PREFILLED_SYRINGE | INTRAMUSCULAR | Status: DC
  Administered 2024-09-23 – 2024-10-06 (×12): 40 mg via SUBCUTANEOUS
  Filled 2024-09-21 (×14): qty 0.4

## 2024-09-21 MED ORDER — BISACODYL 5 MG PO TBEC: 5.0000 mg | DELAYED_RELEASE_TABLET | Freq: Every day | ORAL | Status: DC | PRN

## 2024-09-21 MED ORDER — SODIUM CHLORIDE 0.9 % IV SOLN: INTRAVENOUS | Status: AC

## 2024-09-21 MED ORDER — INSULIN ASPART 100 UNIT/ML IJ SOLN
0.0000 [IU] | Freq: Three times a day (TID) | INTRAMUSCULAR | Status: DC
  Administered 2024-09-22 – 2024-09-23 (×3): 1 [IU] via SUBCUTANEOUS
  Administered 2024-09-23: 2 [IU] via SUBCUTANEOUS
  Administered 2024-09-24 – 2024-09-25 (×4): 1 [IU] via SUBCUTANEOUS
  Administered 2024-09-26 – 2024-09-27 (×2): 3 [IU] via SUBCUTANEOUS
  Administered 2024-09-27 (×2): 1 [IU] via SUBCUTANEOUS
  Administered 2024-09-28: 3 [IU] via SUBCUTANEOUS
  Administered 2024-09-28: 2 [IU] via SUBCUTANEOUS
  Administered 2024-09-28: 1 [IU] via SUBCUTANEOUS
  Administered 2024-09-29: 2 [IU] via SUBCUTANEOUS
  Administered 2024-09-29 – 2024-09-30 (×2): 1 [IU] via SUBCUTANEOUS
  Administered 2024-09-30: 3 [IU] via SUBCUTANEOUS
  Administered 2024-09-30: 1 [IU] via SUBCUTANEOUS
  Administered 2024-10-01: 2 [IU] via SUBCUTANEOUS
  Administered 2024-10-01 (×3): 1 [IU] via SUBCUTANEOUS
  Administered 2024-10-02 – 2024-10-03 (×4): 2 [IU] via SUBCUTANEOUS
  Administered 2024-10-03 – 2024-10-05 (×5): 1 [IU] via SUBCUTANEOUS
  Administered 2024-10-06: 2 [IU] via SUBCUTANEOUS
  Filled 2024-09-21: qty 1
  Filled 2024-09-21 (×2): qty 3
  Filled 2024-09-21: qty 2
  Filled 2024-09-21: qty 1
  Filled 2024-09-21: qty 2
  Filled 2024-09-21: qty 3
  Filled 2024-09-21 (×2): qty 1
  Filled 2024-09-21: qty 2
  Filled 2024-09-21 (×2): qty 1
  Filled 2024-09-21: qty 2
  Filled 2024-09-21: qty 6
  Filled 2024-09-21: qty 3
  Filled 2024-09-21: qty 2
  Filled 2024-09-21 (×5): qty 1
  Filled 2024-09-21: qty 5
  Filled 2024-09-21: qty 1
  Filled 2024-09-21: qty 3
  Filled 2024-09-21 (×2): qty 1
  Filled 2024-09-21: qty 2
  Filled 2024-09-21: qty 3
  Filled 2024-09-21: qty 5
  Filled 2024-09-21: qty 3
  Filled 2024-09-21: qty 4
  Filled 2024-09-21 (×2): qty 1
  Filled 2024-09-21: qty 5

## 2024-09-21 MED ORDER — ONDANSETRON HCL 4 MG/2ML IJ SOLN: 4.0000 mg | Freq: Four times a day (QID) | INTRAMUSCULAR | Status: DC | PRN

## 2024-09-21 MED ORDER — ALBUTEROL SULFATE (2.5 MG/3ML) 0.083% IN NEBU: 3.0000 mL | INHALATION_SOLUTION | Freq: Four times a day (QID) | RESPIRATORY_TRACT | Status: DC | PRN

## 2024-09-21 MED ORDER — LACTATED RINGERS IV BOLUS
1000.0000 mL | Freq: Once | INTRAVENOUS | Status: AC
  Administered 2024-09-21: 1000 mL via INTRAVENOUS

## 2024-09-21 NOTE — Hospital Course (Signed)
 Lynn [name] is a 78 y.o. female with medical history significant for T2DM, HTN, HLD, CKD stage III, asthma, depression, cognitive impairment/suspected underlying dementia who is admitted after unwitnessed fall at home with prolonged downtime.

## 2024-09-21 NOTE — H&P (Signed)
 History and Physical    Lynn [name] DOB: 04/04/1946 DOA: 09/21/2024  PCP: Elnor Lauraine BRAVO, NP  Patient coming from: Home  I have personally briefly reviewed patient's old medical records in [facility]  Chief Complaint: Fall at home  HPI: Lynn [name] is a 78 y.o. female with medical history significant for T2DM, HTN, HLD, CKD stage III, asthma, depression, cognitive impairment/suspected underlying dementia who presented to the ED from home for evaluation after she was found down on her floor.  History is limited from patient due to altered mental status and is otherwise supplemented by EDP, chart review, and cousin Lynn [name] by phone.  Patient lives alone.  She has had progressive decline in ability to care for self and memory.  Family states that she is post to ambulate with a walker but she never uses it.  She has not been taking any of her medications other than Ambien .  She often refuses help from family.  She has not been bathing herself.  She has not been able to take care of her finances.  Her cousins have been having to take care of all of these things.  She has not left her house since her last PCP visit.    Family do call her twice a day and check in on her to make sure she is doing okay.  They last spoke to her last night around 6:30 PM.  She did not answer the the phone today therefore her cousin went to her house to check in on her.  They found her lying on the hardwood floor undressed.  They tried to get her up.  She was refusing to get up and also thought that she was laying in her own bed.  Family stated that she has confusion at baseline but this appeared worse today.  EMS were called.  Patient was refusing to get up with EMS/fire.  I spent an hour and a half trying to assist her.  Per ED triage documentation patient was combative with EMS and she was given 5 mg of Haldol and she calmed down afterwards.  On admit exam, patient is resting flat  in bed trying to sleep but will answer questions and follow commands.  When asked if she knows where she is right now, she says at home.  When told that she has not had her home she says then I must be in the hospital.  She does not know the year or why she is here.  She does not remember the events of the day.  She is oriented only to self.  She denies any pain.  She denies any recent nausea, vomiting, diarrhea, dysuria, chest pain, dyspnea.  She says that she walks on her own without assistive devices.  ED Course  Labs/Imaging on admission: I have personally reviewed following labs and imaging studies.  Initial vitals showed BP 109/90, pulse 85, RR 17, temp 98.9 F, SpO2 100% on room air.  Labs showed sodium 136, potassium 3.8, bicarb 23, BUN 17, creatinine 1.18, serum glucose 175, AST 27, ALT 16, alk phos 93, total bilirubin 2.4, WBC 12.7, hemoglobin 17.8, platelets 203, CK3 121, troponin 35 > 31.  Serum ethanol <15.  UA and UDS pending collection.  Portable chest x-ray negative for focal consolidation, edema, effusion.  CT head without contrast negative for acute intracranial abnormality.  Periventricular white matter small vessel ischemic changes and age-related involutional changes noted.  CT cervical spine without contrast negative  for acute traumatic abnormalities.  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  Patient was given 1 L LR.  The hospitalist service was consulted for admission.  Review of Systems:  All systems reviewed and are negative except as documented in history of present illness above.   Past Medical History:  Diagnosis Date   ASTHMA 03/14/2007   NO PROBELEM IN 12 YRS   Blood dyscrasia    VON WILLIBRANDE FREE BLEEDER   COLONIC POLYPS, ADENOMATOUS, 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   DEGENERATION, MACULAR NOS 03/14/2007   DEPRESSION 03/14/2007   DIABETES MELLITUS, TYPE II 03/14/2007    Diarrhea 05/31/2007   DISC DISEASE, LUMBAR 05/25/2009   DIVERTICULOSIS OF COLON 01/11/2009   HYPERLIPIDEMIA 06/21/2010   HYPERTENSION 03/14/2007   Lung abnormality    LT LUNG WITH SPOT HAS BEEN FOLLOWED X 3 YRS DR BURNEY   SEBORRHEIC KERATOSIS, INFLAMED 12/02/2010   Unspecified hearing loss 01/13/2010   VERTIGO 11/10/2010    Past Surgical History:  Procedure Laterality Date   CHOLECYSTECTOMY  1982   urosespsis post op   JOINT REPLACEMENT  1998   1998 RT KNEE  +2009 REMOVED, REPLACED JOINT   KNEE ARTHROSCOPY Right 1995   right, TNR   left shoulder Left 2012   rotator cuff   SPINE SURGERY  1996   fusion, ruptured disk   TONSILLECTOMY  1952    Social History: Social History   Tobacco Use   Smoking status: Former    Current packs/day: 0.00    Average packs/day: 2.0 packs/day for 37.0 years (74.0 ttl pk-yrs)    Types: Cigarettes    Start date: 09/18/1962    Quit date: 09/19/1999    Years since quitting: 25.0   Smokeless tobacco: Never  Vaping Use   Vaping status: Never Used  Substance Use Topics   Alcohol use: Yes    Comment: rare   Drug use: No    Allergies  Allergen Reactions   Morphine And Codeine Itching    Patient states that she can still take this medication it just makes her itch.    Family History  Problem Relation Age of Onset   Cancer Mother    Diabetes Mother    Heart disease Mother    Hyperlipidemia Mother    Hypertension Mother    Hyperlipidemia Father    Hypertension Father    Colon cancer Maternal Uncle 33   Colon cancer Maternal Uncle 51   Heart disease Other    Diabetes Other        diabeties   Hyperlipidemia Other    Hypertension Other    Colon cancer Maternal Aunt 70     Prior to Admission medications   Medication Sig Start Date End Date Taking? Authorizing Provider  Accu-Chek FastClix Lancets MISC USE TO TEST BLOOD SUGAR ONCE A DAY 05/02/19   Kennyth Worth HERO, MD  ACCU-CHEK SMARTVIEW test strip TEST BLOOD SUGAR ONCE DAILY 06/18/15   Krystal Reyes LABOR, MD  acetaminophen  (TYLENOL ) 325 MG tablet Take 650 mg by mouth every 6 (six) hours as needed for moderate pain.    [provider]  albuterol  (PROVENTIL  HFA) 108 (90 Base) MCG/ACT inhaler Inhale 2 puffs into the lungs every 6 (six) hours as needed for wheezing or shortness of breath. 07/19/23   Elnor Lauraine BRAVO, NP  atorvastatin  (LIPITOR) 40 MG tablet Take 1 tablet (40 mg total) by mouth daily.  10/26/23 10/25/24  Gray, Sarah E, NP  beclomethasone (QVAR  REDIHALER) 40 MCG/ACT inhaler Inhale 1 puff into the lungs 2 (two) times daily. 07/19/23   Elnor Lauraine BRAVO, NP  blood glucose meter kit and supplies KIT Test once daily for glucose control.  Dx E11.9 08/10/17   Micheal Wolm ORN, MD  Blood Glucose Monitoring Suppl (ACCU-CHEK GUIDE) w/Device KIT 1 Device by Does not apply route daily. 11/07/18   Kennyth Worth HERO, MD  cetirizine (ZYRTEC) 5 MG tablet Take 5 mg by mouth daily.    [provider]  citalopram  (CELEXA ) 20 MG tablet Take 1 tablet (20 mg) by mouth daily. 10/26/23   Elnor Lauraine BRAVO, NP  docusate sodium (COLACE) 100 MG capsule Take 100 mg by mouth daily as needed. Reported on 10/21/2015    [provider]  fluticasone  (FLONASE ) 50 MCG/ACT nasal spray 1 spray up each nostril  at bedtime 03/28/17   Krystal Reyes LABOR, MD  ipratropium (ATROVENT ) 0.03 % nasal spray Place 2 sprays into both nostrils every 12 (twelve) hours. 03/16/22   Elnor Lauraine BRAVO, NP  losartan  (COZAAR ) 25 MG tablet Take 1 tablet (25 mg total) by mouth daily. 10/26/23   Elnor Lauraine BRAVO, NP  metFORMIN  (GLUCOPHAGE ) 500 MG tablet Take 1 tablet (500 mg total) by mouth daily with breakfast. 10/26/23 10/25/24  Elnor Lauraine BRAVO, NP  montelukast  (SINGULAIR ) 10 MG tablet Take 1 tablet (10 mg total) by mouth at bedtime. 09/13/23   Elnor Lauraine BRAVO, NP  Multiple Vitamins-Minerals (OCUVITE PO) Take 1 tablet by mouth daily.     [provider]  zolpidem  (AMBIEN ) 5 MG tablet Take 1 tablet (5 mg total) by mouth at bedtime as  needed for sleep. 09/19/24   Elnor Lauraine BRAVO, NP    Physical Exam: Vitals:   09/21/24 1915 09/21/24 1930 09/21/24 1945 09/21/24 2000  BP: (!) 162/74 (!) 145/72 (!) 173/78 (!) 176/82  Pulse: 85 86 84 86  Resp: 16 16 17 17   Temp:      SpO2: 99% 98% 98% 99%   Constitutional: Resting supine in bed.  NAD, calm, comfortable Eyes: Keeps eyes closed, lids and conjunctivae normal ENMT: Mucous membranes are dry. Posterior pharynx clear of any exudate or lesions.Normal dentition.  Neck: normal, supple, no masses. Respiratory: clear to auscultation anteriorly. Normal respiratory effort. No accessory muscle use.  Cardiovascular: Regular rate and rhythm, no murmurs / rubs / gallops. No extremity edema. 2+ pedal pulses. Abdomen: Soft, no tenderness, no masses palpated. Musculoskeletal: no clubbing / cyanosis. No joint deformity upper and lower extremities. Good ROM, no contractures. Normal muscle tone.  Skin: no rashes, lesions, ulcers. No induration Neurologic: Sensation intact. Strength 5/5 in all 4.  Psychiatric: Alert and oriented to self only.  EKG: Personally reviewed. Sinus rhythm, rate 81, PACs.  Similar to previous.  Assessment/Plan Principal Problem:   Fall at home, initial encounter Active Problems:   T2DM (type 2 diabetes mellitus) (HCC)   Dyslipidemia associated with T2DM (HCC)   HTN associated with DM (HCC)   Asthma   Chronic kidney disease, stage 3a (HCC)   Lynn [name] is a 78 y.o. female with medical history significant for T2DM, HTN, HLD, CKD stage III, asthma, depression, cognitive impairment/suspected underlying dementia who is admitted after unwitnessed fall at home with prolonged downtime.  Assessment and Plan: Unwitnessed fall at home with prolonged downtime Mildly elevated CK: Patient found on floor at home after suspected fall with prolonged downtime.  Patient unable to provide  any context regarding the fall.  There was no obvious injury.  CK is 321.  She was  admitted for similar situation this time last year. - Continue IV fluid hydration overnight - Repeat CK level in a.m., obtain UA - Fall precautions, PT/OT eval  Acute delirium/agitation on background of suspected underlying dementia Depression: Patient with likely underlying dementia.  She lives alone and unable to care for self.  Family have been attempting to care for her but likely needs long-term placement.  Reportedly more confused from baseline, likely from dehydration.  She has not been taking any of her meds other than Ambien . - Continue IV fluid hydration as above - Follow UA - Resume Celexa  20 mg daily - Fall and delirium precautions - TOC consult  CKD stage IIIa: Renal function relatively stable.  Continue IV fluid hydration.  Type 2 diabetes: Has not been taking her metformin .  Last hemoglobin A1c 6.5% in May.  Placed on SSI.  Hypertension: Not taking antihypertensives.  Will place on IV hydralazine as needed.  Hyperlipidemia: Has not been taking atorvastatin , will not restart given elevated CK.  Asthma: Stable on admission.  Continue albuterol  as needed.  Insomnia: Holding Ambien .   DVT prophylaxis: enoxaparin  (LOVENOX ) injection 40 mg Start: 09/22/24 2200 Code Status:   Code Status: Limited: Do not attempt resuscitation (DNR) -DNR-LIMITED -Do Not Intubate/DNI discussed and confirmed with patient's cousin by phone. Family Communication: Discussed with cousin Lynn [name] by phone Disposition Plan: From home, dispo pending clinical progress Consults called: None Severity of Illness: The appropriate patient status for this patient is OBSERVATION. Observation status is judged to be reasonable and necessary in order to provide the required intensity of service to ensure the patient's safety. The patient's presenting symptoms, physical exam findings, and initial radiographic and laboratory data in the context of their medical condition is felt to place them at decreased  risk for further clinical deterioration. Furthermore, it is anticipated that the patient will be medically stable for discharge from the hospital within 2 midnights of admission.   Jorie Blanch MD [facility]  If 7PM-7AM, please contact night-coverage [email or web address]  09/21/2024, 11:47 PM

## 2024-09-21 NOTE — ED Provider Notes (Signed)
 [place] EMERGENCY DEPARTMENT AT [facility] Provider Note   CSN: [id number] Arrival date & time: 09/21/24  1810     Patient presents with: Lynn [name]   Lynn [name] is a 78 y.o. female.   HPI 78 year old female presents with altered mental status and fall.  History is primarily from EMS.  The patient was reportedly last seen normal yesterday.  She has reportedly had progressive cognitive decline for a while according to family that checks on her.  She lives by herself.  She has refused to take all meds.  The patient was found on the floor today, it was unclear how long she was on the floor and was naked.  Cousin called EMS and got her into a nightgown.  The patient denies any acute complaints such as new pain or injury.  No headache, chest pain, etc.  She was quite agitated with EMS and they gave her IM Haldol and she calm down a lot.  She is more conversant but still confused.  Prior to Admission medications   Medication Sig Start Date End Date Taking? Authorizing Provider  Accu-Chek FastClix Lancets MISC USE TO TEST BLOOD SUGAR ONCE A DAY 05/02/19   Kennyth Worth HERO, MD  ACCU-CHEK SMARTVIEW test strip TEST BLOOD SUGAR ONCE DAILY 06/18/15   Krystal Reyes LABOR, MD  acetaminophen  (TYLENOL ) 325 MG tablet Take 650 mg by mouth every 6 (six) hours as needed for moderate pain.    [provider]  albuterol  (PROVENTIL  HFA) 108 (90 Base) MCG/ACT inhaler Inhale 2 puffs into the lungs every 6 (six) hours as needed for wheezing or shortness of breath. 07/19/23   Elnor Lauraine BRAVO, NP  atorvastatin  (LIPITOR) 40 MG tablet Take 1 tablet (40 mg total) by mouth daily. 10/26/23 10/25/24  Gray, Sarah E, NP  beclomethasone (QVAR  REDIHALER) 40 MCG/ACT inhaler Inhale 1 puff into the lungs 2 (two) times daily. 07/19/23   Elnor Lauraine BRAVO, NP  blood glucose meter kit and supplies KIT Test once daily for glucose control.  Dx E11.9 08/10/17   Micheal Wolm ORN, MD  Blood Glucose Monitoring Suppl (ACCU-CHEK  GUIDE) w/Device KIT 1 Device by Does not apply route daily. 11/07/18   Kennyth Worth HERO, MD  cetirizine (ZYRTEC) 5 MG tablet Take 5 mg by mouth daily.    [provider]  citalopram  (CELEXA ) 20 MG tablet Take 1 tablet (20 mg) by mouth daily. 10/26/23   Elnor Lauraine BRAVO, NP  docusate sodium (COLACE) 100 MG capsule Take 100 mg by mouth daily as needed. Reported on 10/21/2015    [provider]  fluticasone  (FLONASE ) 50 MCG/ACT nasal spray 1 spray up each nostril  at bedtime 03/28/17   Krystal Reyes LABOR, MD  ipratropium (ATROVENT ) 0.03 % nasal spray Place 2 sprays into both nostrils every 12 (twelve) hours. 03/16/22   Elnor Lauraine BRAVO, NP  losartan  (COZAAR ) 25 MG tablet Take 1 tablet (25 mg total) by mouth daily. 10/26/23   Elnor Lauraine BRAVO, NP  metFORMIN  (GLUCOPHAGE ) 500 MG tablet Take 1 tablet (500 mg total) by mouth daily with breakfast. 10/26/23 10/25/24  Elnor Lauraine BRAVO, NP  montelukast  (SINGULAIR ) 10 MG tablet Take 1 tablet (10 mg total) by mouth at bedtime. 09/13/23   Elnor Lauraine BRAVO, NP  Multiple Vitamins-Minerals (OCUVITE PO) Take 1 tablet by mouth daily.     [provider]  zolpidem  (AMBIEN ) 5 MG tablet Take 1 tablet (5 mg total) by mouth at bedtime as needed for sleep. 09/19/24  Elnor Lauraine BRAVO, NP    Allergies: Morphine and codeine    Review of Systems  Unable to perform ROS: Mental status change    Updated Vital Signs BP (!) 176/82   Pulse 86   Temp 98.9 F (37.2 C)   Resp 17   SpO2 99%   Physical Exam Vitals and nursing note reviewed.  Constitutional:      Appearance: She is well-developed.  HENT:     Head: Normocephalic and atraumatic.     Mouth/Throat:     Mouth: Mucous membranes are dry.  Eyes:     Pupils: Pupils are equal, round, and reactive to light.  Cardiovascular:     Rate and Rhythm: Normal rate and regular rhythm.     Heart sounds: Normal heart sounds.  Pulmonary:     Effort: Pulmonary effort is normal.     Breath sounds: Normal breath sounds. No  wheezing.  Abdominal:     General: There is no distension.     Palpations: Abdomen is soft.     Tenderness: There is no abdominal tenderness.  Skin:    General: Skin is warm and dry.  Neurological:     Mental Status: She is alert.     Comments: Awake, alert, confused.  Oriented to person and place but disoriented to time/situation.  She has equal strength in both upper extremities and is barely able to lift her right lower leg off the stretcher but states this is because of her previous knee replacement/chronic knee issues.  Left lower extremity is able to lift off the stretcher without difficulty.     (all labs ordered are listed, but only abnormal results are displayed) Labs Reviewed  COMPREHENSIVE METABOLIC PANEL WITH GFR - Abnormal; Notable for the following components:      Result Value   Glucose, Bld 175 (*)    Creatinine, Ser 1.18 (*)    Total Protein 6.3 (*)    Albumin 3.3 (*)    Total Bilirubin 2.4 (*)    GFR, Estimated 47 (*)    All other components within normal limits  CBC WITH DIFFERENTIAL/PLATELET - Abnormal; Notable for the following components:   WBC 12.7 (*)    RBC 5.91 (*)    Hemoglobin 17.8 (*)    HCT 52.8 (*)    Neutro Abs 10.6 (*)    All other components within normal limits  CK - Abnormal; Notable for the following components:   Total CK 321 (*)    All other components within normal limits  I-STAT CHEM 8, ED - Abnormal; Notable for the following components:   Sodium 134 (*)    Glucose, Bld 171 (*)    Calcium , Ion 1.01 (*)    TCO2 21 (*)    Hemoglobin 18.0 (*)    HCT 53.0 (*)    All other components within normal limits  TROPONIN I (HIGH SENSITIVITY) - Abnormal; Notable for the following components:   Troponin I (High Sensitivity) 35 (*)    All other components within normal limits  TROPONIN I (HIGH SENSITIVITY) - Abnormal; Notable for the following components:   Troponin I (High Sensitivity) 31 (*)    All other components within normal limits   ETHANOL  URINALYSIS, ROUTINE W REFLEX MICROSCOPIC  RAPID URINE DRUG SCREEN, HOSP PERFORMED    EKG: EKG Interpretation Date/Time:  Sunday September 21 2024 18:33:24 EST Ventricular Rate:  81 PR Interval:  173 QRS Duration:  93 QT Interval:  384 QTC Calculation: 446 R  Axis:   52  Text Interpretation: Sinus rhythm Atrial premature complexes Anterior infarct, old Minimal ST elevation, inferior leads Confirmed by Freddi [name] [phone]) on 09/21/2024 7:06:30 PM  Radiology: CT Head Wo Contrast Result Date: 09/21/2024 EXAM: CT HEAD AND CERVICAL SPINE 09/21/2024 08:26:00 PM TECHNIQUE: CT of the head and cervical spine was performed without the administration of intravenous contrast. Multiplanar reformatted images are provided for review. Automated exposure control, iterative reconstruction, and/or weight based adjustment of the mA/kV was utilized to reduce the radiation dose to as low as reasonably achievable. COMPARISON: 09/26/2023. CLINICAL HISTORY: Mental status change, unknown cause. FINDINGS: CT HEAD BRAIN AND VENTRICLES: Periventricular white matter small vessel ischemic changes. Age-related involutional changes. No acute intracranial hemorrhage. No mass effect or midline shift. No abnormal extra-axial fluid collection. No evidence of acute infarct. No hydrocephalus. ORBITS: No acute abnormality. SINUSES AND MASTOIDS: No acute abnormality. SOFT TISSUES AND SKULL: No acute skull fracture. No acute soft tissue abnormality. 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 No acute fracture or traumatic malalignment. DEGENERATIVE CHANGES: Extensive degenerative disc disease with anterior and posterior projecting osteophyte formation and disc space narrowing at each cervical level. SOFT TISSUES: No prevertebral soft tissue swelling. IMPRESSION: 1. No acute intracranial abnormality. 2. Periventricular white matter small vessel ischemic changes and age-related involutional changes. 3.  No acute traumatic abnormalities of the cervical spine. 4. Extensive cervical degenerative disc disease with anterior and posterior osteophyte formation and disc space narrowing at each level, with osteoarthritis at C1C2. Electronically signed by: Fonda Field MD 09/21/2024 08:50 PM EST RP Workstation: [id number]   CT Cervical Spine Wo Contrast Result Date: 09/21/2024 EXAM: CT HEAD AND CERVICAL SPINE 09/21/2024 08:26:00 PM TECHNIQUE: CT of the head and cervical spine was performed without the administration of intravenous contrast. Multiplanar reformatted images are provided for review. Automated exposure control, iterative reconstruction, and/or weight based adjustment of the mA/kV was utilized to reduce the radiation dose to as low as reasonably achievable. COMPARISON: 09/26/2023. CLINICAL HISTORY: Mental status change, unknown cause. FINDINGS: CT HEAD BRAIN AND VENTRICLES: Periventricular white matter small vessel ischemic changes. Age-related involutional changes. No acute intracranial hemorrhage. No mass effect or midline shift. No abnormal extra-axial fluid collection. No evidence of acute infarct. No hydrocephalus. ORBITS: No acute abnormality. SINUSES AND MASTOIDS: No acute abnormality. SOFT TISSUES AND SKULL: No acute skull fracture. No acute soft tissue abnormality. 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 No acute fracture or traumatic malalignment. DEGENERATIVE CHANGES: Extensive degenerative disc disease with anterior and posterior projecting osteophyte formation and disc space narrowing at each cervical level. SOFT TISSUES: No prevertebral soft tissue swelling. IMPRESSION: 1. No acute intracranial abnormality. 2. Periventricular white matter small vessel ischemic changes and age-related involutional changes. 3. No acute traumatic abnormalities of the cervical spine. 4. Extensive cervical degenerative disc disease with anterior and posterior osteophyte formation and  disc space narrowing at each level, with osteoarthritis at C1C2. Electronically signed by: Fonda Field MD 09/21/2024 08:50 PM EST RP Workstation: [id number]   DG Chest Portable 1 View Result Date: 09/21/2024 CLINICAL DATA:  Altered mental status. EXAM: PORTABLE CHEST 1 VIEW COMPARISON:  September 26, 2023 FINDINGS: The heart size and mediastinal contours are within normal limits. There is marked severity calcification of the aortic arch. The lungs are hyperinflated with mild, diffuse, chronic appearing increased interstitial lung markings. A stable 11 mm pulmonary nodule is seen within the mid left lung. No pleural effusion or pneumothorax is identified. The  visualized skeletal structures are unremarkable. IMPRESSION: No acute or active cardiopulmonary disease. Electronically Signed   By: Suzen Dials M.D.   On: 09/21/2024 20:14     Procedures   Medications Ordered in the ED  lactated ringers  bolus 1,000 mL (0 mLs Intravenous Stopped 09/21/24 2204)                                    Medical Decision Making Amount and/or Complexity of Data Reviewed Independent Historian: EMS Labs: ordered.    Details: Mild but stable troponin elevations Radiology: ordered and independent interpretation performed.    Details: No head bleed ECG/medicine tests: ordered and independent interpretation performed.    Details: Nonspecific changes  Risk Decision regarding hospitalization.   Patient presents with worse altered mental status.  Discussed with cousin at the bedside after she arrived and the patient has probably been having progressive dementia/memory changes but now is more altered today.  Vital signs show some hypertension but otherwise are unremarkable.  Head CT negative.  Patient has minimal troponin elevation but flat, doubt ischemia as the cause.  Unable to get urine due to patient not being compliant, given that she does not appear septic I do not think we have to sedate her for an  In-N-Out cath and can wait for urine.  Will need admission.  Discussed with Dr. Tobie.     Final diagnoses:  Altered mental status, unspecified altered mental status type    ED Discharge Orders     None          Freddi [name], MD 09/21/24 [phone]

## 2024-09-21 NOTE — ED Notes (Addendum)
 This nurse asked patient for a urine sample. Patient stated that she could not produce one right now. This nurse asked if we could perform an in and out catheter on her. Patient stated no. This nurse asked if there was a reason we could not, patient stated, You cannot do anything without my consent. Now get out and turn the lights off so I can go back to sleep.SABRA EDP Freddi MD made aware.

## 2024-09-21 NOTE — ED Triage Notes (Signed)
 Pt from home, pt was found on floor in living room and unknown how long she has been on the floor. Pt altered at baseline. Pt was combative with EMS. EMS gave her 2m of haldol. Non compliant with medication. Last seen last night.

## 2024-09-22 ENCOUNTER — Observation Stay (HOSPITAL_COMMUNITY)

## 2024-09-22 DIAGNOSIS — Z7189 Other specified counseling: Secondary | ICD-10-CM

## 2024-09-22 DIAGNOSIS — N1831 Chronic kidney disease, stage 3a: Secondary | ICD-10-CM

## 2024-09-22 DIAGNOSIS — E785 Hyperlipidemia, unspecified: Secondary | ICD-10-CM | POA: Diagnosis not present

## 2024-09-22 DIAGNOSIS — W19XXXA Unspecified fall, initial encounter: Secondary | ICD-10-CM

## 2024-09-22 DIAGNOSIS — Z515 Encounter for palliative care: Secondary | ICD-10-CM

## 2024-09-22 DIAGNOSIS — Y92009 Unspecified place in unspecified non-institutional (private) residence as the place of occurrence of the external cause: Secondary | ICD-10-CM

## 2024-09-22 DIAGNOSIS — R4182 Altered mental status, unspecified: Secondary | ICD-10-CM

## 2024-09-22 DIAGNOSIS — R7989 Other specified abnormal findings of blood chemistry: Secondary | ICD-10-CM

## 2024-09-22 DIAGNOSIS — E1159 Type 2 diabetes mellitus with other circulatory complications: Secondary | ICD-10-CM

## 2024-09-22 DIAGNOSIS — E119 Type 2 diabetes mellitus without complications: Secondary | ICD-10-CM

## 2024-09-22 DIAGNOSIS — E1169 Type 2 diabetes mellitus with other specified complication: Secondary | ICD-10-CM

## 2024-09-22 DIAGNOSIS — I152 Hypertension secondary to endocrine disorders: Secondary | ICD-10-CM

## 2024-09-22 LAB — BASIC METABOLIC PANEL WITH GFR
Anion gap: 12 (ref 5–15)
BUN: 18 mg/dL (ref 8–23)
CO2: 22 mmol/L (ref 22–32)
Calcium: 8.7 mg/dL — ABNORMAL LOW (ref 8.9–10.3)
Chloride: 102 mmol/L (ref 98–111)
Creatinine, Ser: 1.15 mg/dL — ABNORMAL HIGH (ref 0.44–1.00)
GFR, Estimated: 49 mL/min — ABNORMAL LOW (ref >60)
Glucose, Bld: 151 mg/dL — ABNORMAL HIGH (ref 70–99)
Potassium: 3.5 mmol/L (ref 3.5–5.1)
Sodium: 136 mmol/L (ref 135–145)

## 2024-09-22 LAB — CBC
HCT: 48.9 % — ABNORMAL HIGH (ref 36.0–46.0)
Hemoglobin: 16.4 g/dL — ABNORMAL HIGH (ref 12.0–15.0)
MCH: 30.2 pg (ref 26.0–34.0)
MCHC: 33.5 g/dL (ref 30.0–36.0)
MCV: 90.1 fL (ref 80.0–100.0)
Platelets: 183 K/uL (ref 150–400)
RBC: 5.43 MIL/uL — ABNORMAL HIGH (ref 3.87–5.11)
RDW: 13.2 % (ref 11.5–15.5)
WBC: 11 K/uL — ABNORMAL HIGH (ref 4.0–10.5)
nRBC: 0 % (ref 0.0–0.2)

## 2024-09-22 LAB — CBG MONITORING, ED
Glucose-Capillary: 145 mg/dL — ABNORMAL HIGH (ref 70–99)
Glucose-Capillary: 147 mg/dL — ABNORMAL HIGH (ref 70–99)
Glucose-Capillary: 124 mg/dL — ABNORMAL HIGH (ref 70–99)
Glucose-Capillary: 119 mg/dL — ABNORMAL HIGH (ref 70–99)

## 2024-09-22 LAB — CK: Total CK: 159 U/L (ref 38–234)

## 2024-09-22 MED ORDER — HALOPERIDOL LACTATE 5 MG/ML IJ SOLN
2.0000 mg | Freq: Four times a day (QID) | INTRAMUSCULAR | Status: DC | PRN
  Filled 2024-09-22: qty 1

## 2024-09-22 MED ORDER — OXYCODONE HCL 5 MG PO TABS
2.5000 mg | ORAL_TABLET | Freq: Four times a day (QID) | ORAL | Status: DC | PRN
  Administered 2024-09-22: 2.5 mg via ORAL
  Filled 2024-09-22: qty 1

## 2024-09-22 NOTE — ED Notes (Signed)
 Pt refusing 1200 cbg check. RN aware. Will try again later.

## 2024-09-22 NOTE — ED Notes (Signed)
 Elgie, MD messaged to request pure wick order. Pt states that her back hurts too bad to get on a bed pan and pt refused to get on bedside.

## 2024-09-22 NOTE — Consult Note (Signed)
 Consultation Note Date: 09/22/2024   Patient Name: Lynn [name]  DOB: [birth year]  MRN: [id number]  Age / Sex: 78 y.o., female   PCP: Elnor Lauraine BRAVO, NP Referring Physician: Cherlyn Labella, MD  Reason for Consultation: Establishing goals of care     Chief Complaint/History of Present Illness:  Ms. Govea is a 78 year old female with history of diabetes type 2, hypertension, hyperlipidemia, CKD stage III, asthma, depression, cognitive impairment/suspected dementia admitted after being found down in her home by family.  She is currently admitted for mild rhabdomyolysis, mildly elevated troponins, and dementia with agitation.  She has required IV Haldol in the ED due to agitation.  Noted that she is not safe to be home by herself moving forward and evaluations pending from PT, OT, and TOC.  Chart reviewed including personal review of labs that reveal sodium 136, potassium 3.5, creatinine 1.15, white blood count 11, hemoglobin 16.4, troponin 31 (35 at first draw).  Imaging personally reviewed with CT head without acute intracranial abnormality.  I saw and examined patient and met with her 3 cousins at the bedside.  Patient is confused and somewhat agitated.  I also met with her 3 cousins in the hall per their request.  They report patient is always been a fiercely independent person.  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  She has not been married.  Arzella Boyer, is her designated primary healthcare power of attorney.  They dropped off a copy of paperwork to desk today but it has not yet been scanned in for review.  Her cousins report that patient has stated that she is ready to die and go and see her mother.  She has been a fiercely independent person who can also be difficult to get along with at times per their report.    They do not feel she is safe to live by herself any longer and understand need to pursue placement for her safety.  Cousins reports that she has confusion  with including thinking that her cousin's husband is her father (though she has never met her father), he lives next-door to her, and President Trump is supposed to come to her house to visit later this week.  We discussed clinical course as well as wishes moving forward in regard to advanced directives.  Concepts specific to code status discussed.  Patient has been very clear that she would not desire heroic interventions in the event of cardiac or respiratory arrest.  We discussed difference between a aggressive medical intervention path and a palliative, comfort focused care path.  Values and goals of care important to patient and family were attempted to be elicited.  Family would be agreeable to transition to full comfort care, however, patient appears stable at this time.  Discussed need for rehab/possible long-term placement and they are in agreement.  Reviewed advance care planning including consideration for completion of a MOST form.  They are agreeable and like to meet again tomorrow once we have more information available to discuss long-term options.  Questions and concerns addressed.   PMT will continue to support holistically.    Primary Diagnoses  Present on Admission:  Asthma  Dyslipidemia associated with T2DM (HCC)  Chronic kidney disease, stage 3a (HCC)  HTN associated with DM (HCC)   Palliative Review of Systems: Denies complaints, but is agitated at times  Past Medical History:  Diagnosis Date   ASTHMA 03/14/2007   NO PROBELEM IN 12 YRS   Blood dyscrasia  VON WILLIBRANDE FREE BLEEDER   COLONIC POLYPS, ADENOMATOUS, 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   DEGENERATION, MACULAR NOS 03/14/2007   DEPRESSION 03/14/2007   DIABETES MELLITUS, TYPE II 03/14/2007   Diarrhea 05/31/2007   DISC DISEASE, LUMBAR 05/25/2009   DIVERTICULOSIS OF COLON 01/11/2009   HYPERLIPIDEMIA 06/21/2010   HYPERTENSION 03/14/2007   Lung abnormality    LT LUNG WITH SPOT HAS BEEN FOLLOWED X 3 YRS DR BURNEY   SEBORRHEIC  KERATOSIS, INFLAMED 12/02/2010   Unspecified hearing loss 01/13/2010   VERTIGO 11/10/2010   Social History   Socioeconomic History   Marital status: Single    Spouse name: Not on file   Number of children: Not on file   Years of education: Not on file   Highest education level: Not on file  Occupational History   Occupation: RETIRED  Tobacco Use   Smoking status: Former    Current packs/day: 0.00    Average packs/day: 2.0 packs/day for 37.0 years (74.0 ttl pk-yrs)    Types: Cigarettes    Start date: 09/18/1962    Quit date: 09/19/1999    Years since quitting: 25.0   Smokeless tobacco: Never  Vaping Use   Vaping status: Never Used  Substance and Sexual Activity   Alcohol use: Yes    Comment: rare   Drug use: No   Sexual activity: Not on file  Other Topics Concern   Not on file  Social History Narrative   Lives alone with one cat   Social Drivers of Health   Financial Resource Strain: Low Risk  (03/06/2024)   Overall Financial Resource Strain (CARDIA)    Difficulty of Paying Living Expenses: Not hard at all  Food Insecurity: No Food Insecurity (03/06/2024)   Hunger Vital Sign    Worried About Running Out of Food in the Last Year: Never true    Ran Out of Food in the Last Year: Never true  Transportation Needs: No Transportation Needs (03/06/2024)   PRAPARE - Administrator, Civil Service (Medical): No    Lack of Transportation (Non-Medical): No  Physical Activity: Inactive (03/06/2024)   Exercise Vital Sign    Days of Exercise per Week: 0 days    Minutes of Exercise per Session: 0 min  Stress: Stress Concern Present (03/06/2024)   [employer] of Occupational Health - Occupational Stress Questionnaire    Feeling of Stress : To some extent  Social Connections: Socially Isolated (03/06/2024)   Social Connection and Isolation Panel    Frequency of Communication with Friends and Family: More than three times a week    Frequency of Social Gatherings with Friends and  Family: Three times a week    Attends Religious Services: Never    Active Member of Clubs or Organizations: No    Attends Banker Meetings: Never    Marital Status: Never married   Family History  Problem Relation Age of Onset   Cancer Mother    Diabetes Mother    Heart disease Mother    Hyperlipidemia Mother    Hypertension Mother    Hyperlipidemia Father    Hypertension Father    Colon cancer Maternal Uncle 13   Colon cancer Maternal Uncle 80   Heart disease Other    Diabetes Other        diabeties   Hyperlipidemia Other    Hypertension Other    Colon cancer Maternal Aunt 70   Scheduled Meds:  citalopram   20 mg Oral Daily   enoxaparin  (LOVENOX )  injection  40 mg Subcutaneous Q24H   insulin aspart  0-9 Units Subcutaneous TID WC   sodium chloride  flush  3 mL Intravenous Q12H   Continuous Infusions: PRN Meds:.acetaminophen  **OR** acetaminophen , albuterol , bisacodyl, haloperidol lactate, hydrALAZINE, melatonin, ondansetron  **OR** ondansetron  (ZOFRAN ) IV, senna-docusate Allergies  Allergen Reactions   Diphenhydramine Other (See Comments)    Makes her hyperactive    Morphine And Codeine Itching    Patient states that she can still take this medication it just makes her itch.   CBC:    Component Value Date/Time   WBC 11.0 (H) 09/22/2024 1124   HGB 16.4 (H) 09/22/2024 1124   HCT 48.9 (H) 09/22/2024 1124   PLT 183 09/22/2024 1124   MCV 90.1 09/22/2024 1124   NEUTROABS 10.6 (H) 09/21/2024 1819   LYMPHSABS 1.0 09/21/2024 1819   MONOABS 0.9 09/21/2024 1819   EOSABS 0.0 09/21/2024 1819   BASOSABS 0.0 09/21/2024 1819   Comprehensive Metabolic Panel:    Component Value Date/Time   NA 136 09/22/2024 1124   K 3.5 09/22/2024 1124   CL 102 09/22/2024 1124   CO2 22 09/22/2024 1124   BUN 18 09/22/2024 1124   CREATININE 1.15 (H) 09/22/2024 1124   GLUCOSE 151 (H) 09/22/2024 1124   CALCIUM  8.7 (L) 09/22/2024 1124   AST 27 09/21/2024 1819   ALT 16 09/21/2024 1819    ALKPHOS 93 09/21/2024 1819   BILITOT 2.4 (H) 09/21/2024 1819   PROT 6.3 (L) 09/21/2024 1819   ALBUMIN 3.3 (L) 09/21/2024 1819    Physical Exam: Vital Signs: BP 93/67   Pulse 89   Temp (!) 97.1 F (36.2 C) (Oral)   Resp 16   SpO2 98%  SpO2: SpO2: 98 % O2 Device:   O2 Flow Rate:   Intake/output summary:  Intake/Output Summary (Last 24 hours) at 09/22/2024 1525 Last data filed at 09/22/2024 0034 Gross per 24 hour  Intake 3 ml  Output --  Net 3 ml   LBM:   Baseline Weight:   Most recent weight:    General: Alert, awake, in no acute distress.  Confused. HEENT: No bruits, no goiter, no JVD Heart: Regular rate and rhythm. No murmur appreciated. Lungs: Good air movement, clear Abdomen: Soft, nontender, nondistended, positive bowel sounds.   Ext: No significant edema Skin: Warm and dry Neuro: Grossly intact, nonfocal. Psych, confused and slightly agitated            Additional Data Reviewed: Recent Labs    09/21/24 1819 09/21/24 1834 09/22/24 1124  WBC 12.7*  --  11.0*  HGB 17.8* 18.0* 16.4*  PLT 203  --  183  NA 136 134* 136  BUN 17 22 18   CREATININE 1.18* 1.00 1.15*    Imaging: CT Head Wo Contrast EXAM: CT HEAD AND CERVICAL SPINE 09/21/2024 08:26:00 PM  TECHNIQUE: CT of the head and cervical spine was performed without the administration of intravenous contrast. Multiplanar reformatted images are provided for review. Automated exposure control, iterative reconstruction, and/or weight based adjustment of the mA/kV was utilized to reduce the radiation dose to as low as reasonably achievable.  COMPARISON: 09/26/2023.  CLINICAL HISTORY: Mental status change, unknown cause.  FINDINGS:  CT HEAD  BRAIN AND VENTRICLES: Periventricular white matter small vessel ischemic changes. Age-related involutional changes. No acute intracranial hemorrhage. No mass effect or midline shift. No abnormal extra-axial fluid collection. No evidence of acute infarct.  No hydrocephalus.  ORBITS: No acute abnormality.  SINUSES AND MASTOIDS: No acute abnormality.  SOFT TISSUES  AND SKULL: No acute skull fracture. No acute soft tissue abnormality.  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 No acute fracture or traumatic malalignment.  DEGENERATIVE CHANGES: Extensive degenerative disc disease with anterior and posterior projecting osteophyte formation and disc space narrowing at each cervical level.  SOFT TISSUES: No prevertebral soft tissue swelling.  IMPRESSION: 1. No acute intracranial abnormality. 2. Periventricular white matter small vessel ischemic changes and age-related involutional changes. 3. No acute traumatic abnormalities of the cervical spine. 4. Extensive cervical degenerative disc disease with anterior and posterior osteophyte formation and disc space narrowing at each level, with osteoarthritis at C1C2.  Electronically signed by: Fonda Field MD 09/21/2024 08:50 PM EST RP Workstation: [id number] CT Cervical Spine Wo Contrast EXAM: CT HEAD AND CERVICAL SPINE 09/21/2024 08:26:00 PM  TECHNIQUE: CT of the head and cervical spine was performed without the administration of intravenous contrast. Multiplanar reformatted images are provided for review. Automated exposure control, iterative reconstruction, and/or weight based adjustment of the mA/kV was utilized to reduce the radiation dose to as low as reasonably achievable.  COMPARISON: 09/26/2023.  CLINICAL HISTORY: Mental status change, unknown cause.  FINDINGS:  CT HEAD  BRAIN AND VENTRICLES: Periventricular white matter small vessel ischemic changes. Age-related involutional changes. No acute intracranial hemorrhage. No mass effect or midline shift. No abnormal extra-axial fluid collection. No evidence of acute infarct. No hydrocephalus.  ORBITS: No acute abnormality.  SINUSES AND MASTOIDS: No acute abnormality.  SOFT  TISSUES AND SKULL: No acute skull fracture. No acute soft tissue abnormality.  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 No acute fracture or traumatic malalignment.  DEGENERATIVE CHANGES: Extensive degenerative disc disease with anterior and posterior projecting osteophyte formation and disc space narrowing at each cervical level.  SOFT TISSUES: No prevertebral soft tissue swelling.  IMPRESSION: 1. No acute intracranial abnormality. 2. Periventricular white matter small vessel ischemic changes and age-related involutional changes. 3. No acute traumatic abnormalities of the cervical spine. 4. Extensive cervical degenerative disc disease with anterior and posterior osteophyte formation and disc space narrowing at each level, with osteoarthritis at C1C2.  Electronically signed by: Fonda Field MD 09/21/2024 08:50 PM EST RP Workstation: [id number] DG Chest Portable 1 View CLINICAL DATA:  Altered mental status.  EXAM: PORTABLE CHEST 1 VIEW  COMPARISON:  September 26, 2023  FINDINGS: The heart size and mediastinal contours are within normal limits. There is marked severity calcification of the aortic arch. The lungs are hyperinflated with mild, diffuse, chronic appearing increased interstitial lung markings. A stable 11 mm pulmonary nodule is seen within the mid left lung. No pleural effusion or pneumothorax is identified. The visualized skeletal structures are unremarkable.  IMPRESSION: No acute or active cardiopulmonary disease.  Electronically Signed   By: Suzen Dials M.D.   On: 09/21/2024 20:14    I personally reviewed recent imaging.   Palliative Care Assessment and Plan Summary of Established Goals of Care and Medical Treatment Preferences    # Complex medical decision making/goals of care  - Await input from PT, OT, TOC.  Patient not safe to return home.  Family concerned she would not be agreeable to placement.  Overall,  goals would be more aligned with comfort if she were actively dying, however, this is not the case and we will need to continue to pursue options for placement.  - Plan for follow-up family meeting tomorrow at 11 AM.  We did discuss advance care planning documents and we  discussed plan to review MOST form tomorrow.  -  Code Status: Limited: Do not attempt resuscitation (DNR) -DNR-LIMITED -Do Not Intubate/DNI   Prognosis: Unable to determine  # Symptom management Patient is receiving these palliative interventions for symptom management with an intent to improve quality of life.   - Agitation: Haldol as needed  # Psycho-social/Spiritual Support:  - Support System: 3 cousins, who also service her HCPOA. - Desire for further Chaplain support:no  # Discharge Planning:  To Be Determined  Thank you for allowing the palliative care team to participate in the care [facility].  Amaryllis Meissner, MD Palliative Care Provider PMT # [phone]  If patient remains symptomatic despite maximum doses, please call PMT at [phone] between 0700 and 1900. Outside of these hours, please call attending, as PMT does not have night coverage.  I personally spent a total of 80 minutes in the care of the patient today including preparing to see the patient, getting/reviewing separately obtained history, performing a medically appropriate exam/evaluation, counseling and educating, referring and communicating with other health care professionals, documenting clinical information in the EHR, and coordinating care.

## 2024-09-22 NOTE — ED Notes (Signed)
 Per report, pt refused morning labs. Pt now refusing CBG check. Pt raised hand to this RN and threatened to hit, and verbally aggressive while trying to perform care. MD notified.

## 2024-09-22 NOTE — ED Notes (Signed)
 This RN is assuming care of this pt. Pt is resting with her eyes closed. Respirations at 18 and 98% O2 on RA. Pt's family at bedside.

## 2024-09-22 NOTE — Evaluation (Signed)
 Occupational Therapy Evaluation Patient Details Name: Lynn [name] MRN: [id number] DOB: 11/02/1946 Today's Date: 09/22/2024   History of Present Illness   Patient is a 78 yo female presenting to the ED with AMS, found down for an unknown period of time on 09/21/24. Head CT clear. PMH includes:  T2DM, HTN, HLD, CKD stage III, asthma, depression, cognitive impairment/suspected underlying dementia (Simultaneous filing. User may not have seen previous data.)     Clinical Impressions Prior to this admission, patient was living alone, with her cousins assiting with meals, setting up medications, and managing finances. Patient has a life alert button, but does not use it, and has a walker and a cane but also does not use. Family reports a significant cognitive decline of late. Currently, patient with increased back pain, occasional restlessness/agitation and need for total A of 2 to transition to edge of stretcher. Patient max A for ADL management. Ambulation not attempted due to cognition, decreased balance, pain, and decreased awareness. OT recomending stint at lesser intensity rehab < 3 hours prior to discharge home. OT will continue to follow acutely.   BP in supine: 123/103 (110)     If plan is discharge home, recommend the following:   Two people to help with walking and/or transfers;A lot of help with bathing/dressing/bathroom;Assistance with cooking/housework;Assistance with feeding;Direct supervision/assist for medications management;Direct supervision/assist for financial management;Assist for transportation;Help with stairs or ramp for entrance;Supervision due to cognitive status     Functional Status Assessment   Patient has had a recent decline in their functional status and demonstrates the ability to make significant improvements in function in a reasonable and predictable amount of time.     Equipment Recommendations   Other (comment) (defer to next venue of care)      Recommendations for Other Services         Precautions/Restrictions   Precautions Precautions: Fall (Simultaneous filing. User may not have seen previous data.) Recall of Precautions/Restrictions: Impaired (Simultaneous filing. User may not have seen previous data.) Restrictions Weight Bearing Restrictions Per Provider Order: No (Simultaneous filing. User may not have seen previous data.)     Mobility Bed Mobility Overal bed mobility: Needs Assistance Bed Mobility: Rolling, Sidelying to Sit, Sit to Sidelying Rolling: Max assist, +2 for physical assistance, +2 for safety/equipment Sidelying to sit: Total assist, +2 for physical assistance, +2 for safety/equipment     Sit to sidelying: Total assist, +2 for physical assistance, +2 for safety/equipment General bed mobility comments: patient pulling to sit with bed rails in initiate, PT providing assist at trunk, OT providing assist with BLEs, total A of 2 to come into sitting on edge of stretcher, min to mod A to maintain balance, total A of 2 to transition back into sidelying, with pillows placed between BLEs and at low back    Transfers Overall transfer level: Needs assistance                 General transfer comment: did not assess due to pain/cognition/awareness      Balance Overall balance assessment: Needs assistance Sitting-balance support: Bilateral upper extremity supported, Feet unsupported Sitting balance-Leahy Scale: Fair                                     ADL either performed or assessed with clinical judgement   ADL Overall ADL's : Needs assistance/impaired Eating/Feeding: Set up;Sitting   Grooming: Set up;Sitting;Wash/dry hands;Wash/dry face  Upper Body Bathing: Contact guard assist;Sitting   Lower Body Bathing: Total assistance;Maximal assistance;Sit to/from stand;Sitting/lateral leans   Upper Body Dressing : Contact guard assist;Sitting   Lower Body Dressing: Total  assistance;Maximal assistance;Sit to/from stand;Sitting/lateral leans   Toilet Transfer: Total assistance;+2 for physical assistance;+2 for safety/equipment   Toileting- Clothing Manipulation and Hygiene: Total assistance;+2 for physical assistance;+2 for safety/equipment;Cueing for sequencing;Cueing for safety;Bed level       Functional mobility during ADLs: Maximal assistance;+2 for physical assistance;+2 for safety/equipment;Cueing for safety;Rolling walker (2 wheels);Cueing for sequencing General ADL Comments: Prior to this admission, patient was living alone, with her cousins assiting with meals, setting up medications, and managing finances. Patient has a life alert button, but does not use it, and has a walker and a cane but also does not use. Family reports a significant cognitive decline of late. Currently, patient with increased back pain, occasional restlessness/agitation and need for total A of 2 to transition to edge of stretcher. Patient max A for ADL management. Ambulation not attempted due to cognition, decreased balance, pain, and decreased awareness. OT recomending stint at lesser intensity rehab < 3 hours prior to discharge home. OT will continue to follow acutely.     Vision Baseline Vision/History: 1 Wears glasses Ability to See in Adequate Light: 1 Impaired Patient Visual Report: No change from baseline Vision Assessment?: No apparent visual deficits Additional Comments: will continue to assess     Perception Perception: Not tested       Praxis Praxis: Not tested       Pertinent Vitals/Pain Pain Assessment Pain Assessment: Faces Faces Pain Scale: Hurts whole lot Pain Location: Back Pain Descriptors / Indicators: Grimacing, Discomfort, Guarding Pain Intervention(s): Limited activity within patient's tolerance, Monitored during session, Repositioned     Extremity/Trunk Assessment Upper Extremity Assessment Upper Extremity Assessment: Generalized weakness;Right  hand dominant   Lower Extremity Assessment Lower Extremity Assessment: Defer to PT evaluation   Cervical / Trunk Assessment Cervical / Trunk Assessment: Kyphotic;Other exceptions Cervical / Trunk Exceptions: increased body habitus   Communication Communication Communication: Impaired Factors Affecting Communication: Difficulty expressing self   Cognition Arousal: Alert Behavior During Therapy: Agitated, Restless, Flat affect Cognition: Cognition impaired   Orientation impairments: Time, Situation, Place Awareness: Intellectual awareness impaired, Online awareness impaired Memory impairment (select all impairments): Short-term memory, Working civil service fast streamer, Non-declarative long-term memory, Geneticist, Molecular long-term memory Attention impairment (select first level of impairment): Focused attention Executive functioning impairment (select all impairments): Initiation, Organization, Sequencing, Reasoning, Problem solving OT - Cognition Comments: cognitive impairments at baseline, would get agitated with family but could be redirected, decreased attention and awareness                 Following commands: Impaired Following commands impaired: Follows one step commands inconsistently     Cueing  General Comments   Cueing Techniques: Verbal cues  123/103 (110)   Exercises     Shoulder Instructions      Home Living Family/patient expects to be discharged to:: Private residence (Simultaneous filing. User may not have seen previous data.) Living Arrangements: Alone (Simultaneous filing. User may not have seen previous data.) Available Help at Discharge: Family;Available PRN/intermittently (Simultaneous filing. User may not have seen previous data.) Type of Home: House ([facility]  Simultaneous filing. User may not have seen previous data.) Home Access: Stairs to enter (Simultaneous filing. User may not have seen previous data.) Entrance Stairs-Number of Steps: 1 (Simultaneous filing. User  may not have seen previous data.) Entrance Stairs-Rails: None (Simultaneous filing. User  may not have seen previous data.) Home Layout: One level (Simultaneous filing. User may not have seen previous data.)     Bathroom Shower/Tub: Walk-in shower (Simultaneous filing. User may not have seen previous data.)   Bathroom Toilet: Standard (Simultaneous filing. User may not have seen previous data.)     Home Equipment: Rolling Walker (2 wheels);Cane - single point;Grab bars - toilet;Grab bars - tub/shower (Simultaneous filing. User may not have seen previous data.)          Prior Functioning/Environment Prior Level of Function : History of Falls (last six months);Needs assist (Simultaneous filing. User may not have seen previous data.)             Mobility Comments: walks with a walker, family endorses falls (Simultaneous filing. User may not have seen previous data.) ADLs Comments: family brings meals 1x/week, inconsistent with medication and has a life alert but does not wear it    OT Problem List: Decreased strength;Impaired balance (sitting and/or standing);Decreased activity tolerance;Decreased range of motion;Decreased coordination;Decreased cognition;Decreased safety awareness;Decreased knowledge of use of DME or AE;Decreased knowledge of precautions;Pain   OT Treatment/Interventions: Self-care/ADL training;Therapeutic exercise;Energy conservation;DME and/or AE instruction;Manual therapy;Therapeutic activities;Patient/family education;Balance training      OT Goals(Current goals can be found in the care plan section)   Acute Rehab OT Goals Patient Stated Goal: unable OT Goal Formulation: Patient unable to participate in goal setting Time For Goal Achievement: 10/06/24 Potential to Achieve Goals: Fair   OT Frequency:  Min 2X/week    Co-evaluation   Reason for Co-Treatment: Complexity of the patient's impairments (multi-system involvement)          AM-PAC OT 6  Clicks Daily Activity     Outcome Measure Help from another person eating meals?: A Little Help from another person taking care of personal grooming?: A Little Help from another person toileting, which includes using toliet, bedpan, or urinal?: Total Help from another person bathing (including washing, rinsing, drying)?: A Lot Help from another person to put on and taking off regular upper body clothing?: A Little Help from another person to put on and taking off regular lower body clothing?: Total 6 Click Score: 13   End of Session Nurse Communication: Mobility status  Activity Tolerance: Patient limited by fatigue;Patient limited by lethargy;Patient limited by pain Patient left: in bed;with bed alarm set;with family/visitor present  OT Visit Diagnosis: Unsteadiness on feet (R26.81);Other abnormalities of gait and mobility (R26.89);Repeated falls (R29.6);Muscle weakness (generalized) (M62.81);History of falling (Z91.81);Other symptoms and signs involving cognitive function;Pain Pain - part of body:  (Back)                Time: 8982-8958 OT Time Calculation (min): 24 min Charges:  OT General Charges $OT Visit: 1 Visit OT Evaluation $OT Eval Moderate Complexity: 1 Mod  Ronal Gift E. Rayaan Garguilo, OTR/L Acute Rehabilitation Services [phone]   Ronal Gift Salt 09/22/2024, 1:44 PM

## 2024-09-22 NOTE — Progress Notes (Signed)
 [facility]                                                                               Lynn [name], is a 78 y.o. female, DOB - 11-Feb-1946, FMW:[id number] Admit date - 09/21/2024    Outpatient Primary MD for the patient is Lynn Lauraine BRAVO, NP  LOS - 0  days    Brief summary   Lynn [name] is a 78 y.o. female with medical history significant for T2DM, HTN, HLD, CKD stage III, asthma, depression, cognitive impairment/suspected underlying dementia who is admitted after unwitnessed fall at home with prolonged downtime.   Assessment & Plan    Assessment and Plan:   Unwitnessed fall at home Mild rhabdomyolysis CK on admission at 321 improved to 159 with IV fluids. Therapy evaluations ordered.     Mildly elevated troponins Patient currently denies any chest pain or shortness of breath EKG shows sinus rhythm with mild ST elevation in the lateral leads.  Family concerned about her having a MI.  Echocardiogram ordered.  Cardiology consulted.    Type 2 DM Continue with SSI.  Will check A1c.   Underlying dementia/ cognitive impairment. With intermittent agitation.  Patient on IV haldol.     Depression Resume celexa .    Hyperlipidemia On statin at home.    Stage 3a ckd Creatinine at baseline.    Mild Leukocytosis.  Rule out UTI. Get UA CXR is negative for pneumonia.   As per the family patient does not take any  meds at home, though she is on several meds.  In view of her worsening debility , deconditioning and refusal to take medications, will request palliative medicine consult for goals of care.  She will also need placement as she is not safe to be by herself.    Estimated body mass index is 30.54 kg/m as calculated from the following:   Height as of 03/28/24: 5' 7 (1.702 m).   Weight as of 03/06/24: 88.5 kg.  Code Status: DNR Confirmed.  DVT Prophylaxis:  enoxaparin  (LOVENOX ) injection 40 mg Start: 09/22/24 2200   Level of  Care: Level of care: Telemetry Family Communication: Updated patient's family at bedside.   Disposition Plan:     Remains inpatient appropriate:  pending further work up   Procedures:  None  Consultants:   Palliative care consult Cardiology.   Antimicrobials:   Anti-infectives (From admission, onward)    None        Medications  Scheduled Meds:  citalopram   20 mg Oral Daily   enoxaparin  (LOVENOX ) injection  40 mg Subcutaneous Q24H   insulin aspart  0-9 Units Subcutaneous TID WC   sodium chloride  flush  3 mL Intravenous Q12H   Continuous Infusions: PRN Meds:.acetaminophen  **OR** acetaminophen , albuterol , bisacodyl, haloperidol lactate, hydrALAZINE, melatonin, ondansetron  **OR** ondansetron  (ZOFRAN ) IV, senna-docusate    Subjective:   Lynn [name] was seen and examined today.  Confused, restless, denies any chest pain or shortness of breath or abdominal pain, nausea or vomiting  Objective:   Vitals:   09/22/24 0615 09/22/24 1015 09/22/24 1048 09/22/24 1335  BP: (!) 156/85 138/87  93/67  Pulse: 91 97  89  Resp:  16 12  16   Temp:   (!) 97.1 F (36.2 C)   TempSrc:   Oral   SpO2: 98% 98%  98%    Intake/Output Summary (Last 24 hours) at 09/22/2024 1355 Last data filed at 09/22/2024 0034 Gross per 24 hour  Intake 3 ml  Output --  Net 3 ml   There were no vitals filed for this visit.   Exam General exam: ill appearing elderly woman, restless, not indistress.  Respiratory system: Clear to auscultation. Respiratory effort normal. Cardiovascular system: S1 & S2 heard, RRR.  Gastrointestinal system: Abdomen is nondistended, soft and nontender.  Central nervous system: Alert and oriented to person only, confused.  Extremities: Symmetric 5 x 5 power. Skin: No rashes, lesions or ulcers Psychiatry: Restless, pleasantly confused    Data Reviewed:  I have personally reviewed following labs and imaging studies   CBC Lab Results  Component Value Date    WBC 11.0 (H) 09/22/2024   RBC 5.43 (H) 09/22/2024   HGB 16.4 (H) 09/22/2024   HCT 48.9 (H) 09/22/2024   MCV 90.1 09/22/2024   MCH 30.2 09/22/2024   PLT 183 09/22/2024   MCHC 33.5 09/22/2024   RDW 13.2 09/22/2024   LYMPHSABS 1.0 09/21/2024   MONOABS 0.9 09/21/2024   EOSABS 0.0 09/21/2024   BASOSABS 0.0 09/21/2024     Last metabolic panel Lab Results  Component Value Date   NA 136 09/22/2024   K 3.5 09/22/2024   CL 102 09/22/2024   CO2 22 09/22/2024   BUN 18 09/22/2024   CREATININE 1.15 (H) 09/22/2024   GLUCOSE 151 (H) 09/22/2024   GFRNONAA 49 (L) 09/22/2024   GFRAA 81 (L) 12/08/2014   CALCIUM  8.7 (L) 09/22/2024   PHOS 2.2 (L) 09/29/2023   PROT 6.3 (L) 09/21/2024   ALBUMIN 3.3 (L) 09/21/2024   BILITOT 2.4 (H) 09/21/2024   ALKPHOS 93 09/21/2024   AST 27 09/21/2024   ALT 16 09/21/2024   ANIONGAP 12 09/22/2024    CBG (last 3)  Recent Labs    09/22/24 1011 09/22/24 1247  GLUCAP 145* 147*      Coagulation Profile: No results for input(s): INR, PROTIME in the last 168 hours.   Radiology Studies: CT Head Wo Contrast Result Date: 09/21/2024 EXAM: CT HEAD AND CERVICAL SPINE 09/21/2024 08:26:00 PM TECHNIQUE: CT of the head and cervical spine was performed without the administration of intravenous contrast. Multiplanar reformatted images are provided for review. Automated exposure control, iterative reconstruction, and/or weight based adjustment of the mA/kV was utilized to reduce the radiation dose to as low as reasonably achievable. COMPARISON: 09/26/2023. CLINICAL HISTORY: Mental status change, unknown cause. FINDINGS: CT HEAD BRAIN AND VENTRICLES: Periventricular white matter small vessel ischemic changes. Age-related involutional changes. No acute intracranial hemorrhage. No mass effect or midline shift. No abnormal extra-axial fluid collection. No evidence of acute infarct. No hydrocephalus. ORBITS: No acute abnormality. SINUSES AND MASTOIDS: No acute abnormality.  SOFT TISSUES AND SKULL: No acute skull fracture. No acute soft tissue abnormality. 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 No acute fracture or traumatic malalignment. DEGENERATIVE CHANGES: Extensive degenerative disc disease with anterior and posterior projecting osteophyte formation and disc space narrowing at each cervical level. SOFT TISSUES: No prevertebral soft tissue swelling. IMPRESSION: 1. No acute intracranial abnormality. 2. Periventricular white matter small vessel ischemic changes and age-related involutional changes. 3. No acute traumatic abnormalities of the cervical spine. 4. Extensive cervical degenerative disc disease with anterior and posterior osteophyte formation  and disc space narrowing at each level, with osteoarthritis at C1C2. Electronically signed by: Fonda Field MD 09/21/2024 08:50 PM EST RP Workstation: [id number]   CT Cervical Spine Wo Contrast Result Date: 09/21/2024 EXAM: CT HEAD AND CERVICAL SPINE 09/21/2024 08:26:00 PM TECHNIQUE: CT of the head and cervical spine was performed without the administration of intravenous contrast. Multiplanar reformatted images are provided for review. Automated exposure control, iterative reconstruction, and/or weight based adjustment of the mA/kV was utilized to reduce the radiation dose to as low as reasonably achievable. COMPARISON: 09/26/2023. CLINICAL HISTORY: Mental status change, unknown cause. FINDINGS: CT HEAD BRAIN AND VENTRICLES: Periventricular white matter small vessel ischemic changes. Age-related involutional changes. No acute intracranial hemorrhage. No mass effect or midline shift. No abnormal extra-axial fluid collection. No evidence of acute infarct. No hydrocephalus. ORBITS: No acute abnormality. SINUSES AND MASTOIDS: No acute abnormality. SOFT TISSUES AND SKULL: No acute skull fracture. No acute soft tissue abnormality. 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 No acute  fracture or traumatic malalignment. DEGENERATIVE CHANGES: Extensive degenerative disc disease with anterior and posterior projecting osteophyte formation and disc space narrowing at each cervical level. SOFT TISSUES: No prevertebral soft tissue swelling. IMPRESSION: 1. No acute intracranial abnormality. 2. Periventricular white matter small vessel ischemic changes and age-related involutional changes. 3. No acute traumatic abnormalities of the cervical spine. 4. Extensive cervical degenerative disc disease with anterior and posterior osteophyte formation and disc space narrowing at each level, with osteoarthritis at C1C2. Electronically signed by: Fonda Field MD 09/21/2024 08:50 PM EST RP Workstation: [id number]   DG Chest Portable 1 View Result Date: 09/21/2024 CLINICAL DATA:  Altered mental status. EXAM: PORTABLE CHEST 1 VIEW COMPARISON:  September 26, 2023 FINDINGS: The heart size and mediastinal contours are within normal limits. There is marked severity calcification of the aortic arch. The lungs are hyperinflated with mild, diffuse, chronic appearing increased interstitial lung markings. A stable 11 mm pulmonary nodule is seen within the mid left lung. No pleural effusion or pneumothorax is identified. The visualized skeletal structures are unremarkable. IMPRESSION: No acute or active cardiopulmonary disease. Electronically Signed   By: Suzen Dials M.D.   On: 09/21/2024 20:14       Elgie Butter M.D. [facility] 09/22/2024, 1:55 PM  Available via Epic secure chat 7am-7pm After 7 pm, please refer to night coverage provider listed on amion.

## 2024-09-22 NOTE — ED Notes (Signed)
 This NT attempted to get pts CBG with RN, pt became agitated cursing and swatting at the RN.

## 2024-09-22 NOTE — ED Notes (Signed)
 PT assisted by NT and RN to sit up and eat breakfast. CBG was also obtained and updated vitals.

## 2024-09-22 NOTE — ED Notes (Addendum)
 Pt complains of lower right back pain 9/10, requesting something stronger than tylenol . Md notified.

## 2024-09-22 NOTE — Evaluation (Signed)
 Physical Therapy Evaluation Patient Details Name: Lynn [name] MRN: [id number] DOB: [birth year] Today's Date: 09/22/2024  History of Present Illness  Patient is a 78 yo female presenting to the ED with AMS, found down for an unknown period of time on 09/21/24. Head CT clear. PMH includes:  T2DM, HTN, HLD, CKD stage III, asthma, depression, cognitive impairment/suspected underlying dementia   Clinical Impression  Pt admitted with above. PLOF and home set up received from cousins present in room. Pt fluctuated from agitation, tearful, and pleasant t/o session. Pt repeatedly kept saying I don't even know where I live. I am crazy. Pt with c/o back pain limiting mobility and requiring increased assist. PTA pt was living alone however was demonstrating a steady decline both functionally and mentally over the last year. At this time patient unsafe to return home alone and would benefit from inpatient rehab program and ultimately a transition to LTC or memory care unit. Acute PT to cont to follow in hospital to minimize functional regression.        If plan is discharge home, recommend the following: A lot of help with walking and/or transfers;A lot of help with bathing/dressing/bathroom;Assistance with cooking/housework;Direct supervision/assist for medications management;Direct supervision/assist for financial management;Assist for transportation;Help with stairs or ramp for entrance;Supervision due to cognitive status   Can travel by private vehicle   No    Equipment Recommendations Other (comment) (TBD at next venue)  Recommendations for Other Services       Functional Status Assessment Patient has had a recent decline in their functional status and/or demonstrates limited ability to make significant improvements in function in a reasonable and predictable amount of time     Precautions / Restrictions Precautions Precautions: Fall Recall of Precautions/Restrictions:  Impaired Restrictions Weight Bearing Restrictions Per Provider Order: No      Mobility  Bed Mobility Overal bed mobility: Needs Assistance Bed Mobility: Rolling, Sit to Sidelying, Supine to Sit Rolling: +2 for physical assistance, Mod assist   Supine to sit: Max assist, Mod assist, +2 for physical assistance, HOB elevated   Sit to sidelying: +2 for physical assistance, +2 for safety/equipment, Max assist General bed mobility comments: max directional verbal cues, pt pulled self forward on rail and PT UEs despite education on logroll, pt then initiated moving LEs to EOB however ultimatedly required mod/maxAx2 via helicopter transfer to EOB, pt did return to sidelying with maxA with pillows between knees to offer support to back    Transfers                   General transfer comment: deferred due to patients emotional lability and back pain    Ambulation/Gait               General Gait Details: unable this date  Stairs            Wheelchair Mobility     Tilt Bed    Modified Rankin (Stroke Patients Only)       Balance Overall balance assessment: Needs assistance (Simultaneous filing. User may not have seen previous data.)     Sitting balance - Comments: pt with posterior bias at times but overall contact guard                                     Pertinent Vitals/Pain Pain Assessment Pain Assessment: Faces Faces Pain Scale: Hurts even more Pain Location: back Pain  Descriptors / Indicators: Aching Pain Intervention(s): Repositioned    Home Living Family/patient expects to be discharged to:: Private residence Living Arrangements: Alone Available Help at Discharge: Family;Available PRN/intermittently Type of Home: House [facility]) Home Access: Stairs to enter Entrance Stairs-Rails: None Entrance Stairs-Number of Steps: 1   Home Layout: One level Home Equipment: Agricultural Consultant (2 wheels);Cane - single point;Grab bars -  toilet;Grab bars - tub/shower      Prior Function Prior Level of Function : History of Falls (last six months);Needs assist             Mobility Comments: has a RW however family reports she doesn't use it because she is stubborn ADLs Comments: has been indep, is a retired [employer] from [employer]     Extremity/Trunk Assessment   Upper Extremity Assessment Upper Extremity Assessment: Defer to OT evaluation    Lower Extremity Assessment Lower Extremity Assessment: Generalized weakness (R TKA with limited flexion at baseline)    Cervical / Trunk Assessment Cervical / Trunk Assessment: Normal;Other exceptions (has had previous back injury and suffers from chronic back pain) Cervical / Trunk Exceptions: increased body habitus  Communication   Communication Communication: Impaired Factors Affecting Communication: Difficulty expressing self    Cognition Arousal: Alert Behavior During Therapy: Agitated, Lability   PT - Cognitive impairments: Orientation, Awareness, Memory, Attention, Initiation, Sequencing, Problem solving, Safety/Judgement   Orientation impairments: Place, Time, Situation                   PT - Cognition Comments: pt emotionally labile, irritated at times, appreciative at times, and then tearful at times. Pt repeatedly stating I don't know where I live. Pt also stating Im crazy, have Sherida go out back and shoot me. Following commands: Impaired Following commands impaired: Follows one step commands with increased time     Cueing Cueing Techniques: Verbal cues, Gestural cues, Tactile cues, Visual cues     General Comments General comments (skin integrity, edema, etc.): VSS    Exercises     Assessment/Plan    PT Assessment Patient needs continued PT services  PT Problem List Decreased strength;Decreased range of motion;Decreased activity tolerance;Decreased balance;Decreased mobility;Decreased coordination       PT Treatment Interventions DME  instruction;Gait training;Functional mobility training;Therapeutic activities;Therapeutic exercise;Balance training;Cognitive remediation    PT Goals (Current goals can be found in the Care Plan section)  Acute Rehab PT Goals Patient Stated Goal: didn't state PT Goal Formulation: With patient/family Time For Goal Achievement: 10/06/24 Potential to Achieve Goals: Fair    Frequency Min 2X/week     Co-evaluation PT/OT/SLP Co-Evaluation/Treatment: Yes Reason for Co-Treatment: Complexity of the patient's impairments (multi-system involvement) PT goals addressed during session: Mobility/safety with mobility         AM-PAC PT 6 Clicks Mobility  Outcome Measure Help needed turning from your back to your side while in a flat bed without using bedrails?: A Lot Help needed moving from lying on your back to sitting on the side of a flat bed without using bedrails?: A Lot Help needed moving to and from a bed to a chair (including a wheelchair)?: Total Help needed standing up from a chair using your arms (e.g., wheelchair or bedside chair)?: Total Help needed to walk in hospital room?: Total Help needed climbing 3-5 steps with a railing? : Total 6 Click Score: 8    End of Session   Activity Tolerance: Treatment limited secondary to agitation Patient left: in bed;with call bell/phone within reach;with family/visitor present Nurse Communication:  Mobility status PT Visit Diagnosis: History of falling (Z91.81)    Time: 8982-8958 PT Time Calculation (min) (ACUTE ONLY): 24 min   Charges:   PT Evaluation $PT Eval Moderate Complexity: 1 Mod   PT General Charges $$ ACUTE PT VISIT: 1 Visit         Norene Ames, PT, DPT Acute Rehabilitation Services Secure chat preferred Office #: [phone]   Norene CHRISTELLA Ames 09/22/2024, 1:55 PM

## 2024-09-22 NOTE — ED Notes (Signed)
 Pt refused to have temperature taken. Asked why NT was in her house. After being informed that she was in the hospital, pt repeatedly asked why she is in the ER. Unable to redirect. Pt currently laying in bed.

## 2024-09-22 NOTE — TOC CM/SW Note (Signed)
 TOC consult received. SW to follow up for placement needs.   Merilee Batty, MSN, RN Case Management [phone]

## 2024-09-22 NOTE — Consult Note (Signed)
 Cardiology Consultation   Patient ID: MAHOGANY TORRANCE MRN: [id number]; DOB: December 14, 1945  Admit date: 09/21/2024 Date of Consult: 09/22/2024  PCP:  Elnor Lauraine BRAVO, NP   [facility] Providers Cardiologist:  New   Patient Profile: AROHI [name] is a 78 y.o. female with a hx of diabetes mellitus, hypertension, hyperlipidemia, chronic stage III kidney disease, cognitive impairment/suspected underlying dementia, depression, asthma admitted with a fall who is being seen 09/22/2024 for the evaluation of abnormal ECG and elevated troponin at the request of Elgie Butter MD.  History of Present Illness: Patient was admitted after being found on her floor and was noted to have altered mental status.  She apparently has had progressive decline in ability to care for self as well as decreased memory.  She has not been bathing herself.  Troponins were checked and were minimally elevated.  Her electrocardiogram also was abnormal and cardiology asked to evaluate.  At time of my evaluation patient think she is at home and does not know the year.  She does not know why she is here.  She denies dyspnea, chest pain, palpitations or syncope.  She does not know her age.   Past Medical History:  Diagnosis Date   ASTHMA 03/14/2007   NO PROBELEM IN 12 YRS   Blood dyscrasia    VON WILLIBRANDE FREE BLEEDER   COLONIC POLYPS, ADENOMATOUS, 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   DEGENERATION, MACULAR NOS 03/14/2007   DEPRESSION 03/14/2007   DIABETES MELLITUS, TYPE II 03/14/2007   Diarrhea 05/31/2007   DISC DISEASE, LUMBAR 05/25/2009   DIVERTICULOSIS OF COLON 01/11/2009   HYPERLIPIDEMIA 06/21/2010   HYPERTENSION 03/14/2007   Lung abnormality    LT LUNG WITH SPOT HAS BEEN FOLLOWED X 3 YRS DR BURNEY   SEBORRHEIC KERATOSIS, INFLAMED 12/02/2010   Unspecified hearing loss 01/13/2010   VERTIGO 11/10/2010    Past Surgical History:  Procedure Laterality Date   CHOLECYSTECTOMY  1982   urosespsis post op   JOINT REPLACEMENT  1998    1998 RT KNEE  +2009 REMOVED, REPLACED JOINT   KNEE ARTHROSCOPY Right 1995   right, TNR   left shoulder Left 2012   rotator cuff   SPINE SURGERY  1996   fusion, ruptured disk   TONSILLECTOMY  1952       Scheduled Meds:  citalopram   20 mg Oral Daily   enoxaparin  (LOVENOX ) injection  40 mg Subcutaneous Q24H   insulin aspart  0-9 Units Subcutaneous TID WC   sodium chloride  flush  3 mL Intravenous Q12H   Continuous Infusions:  PRN Meds: acetaminophen  **OR** acetaminophen , albuterol , bisacodyl, haloperidol lactate, hydrALAZINE, melatonin, ondansetron  **OR** ondansetron  (ZOFRAN ) IV, senna-docusate  Allergies:    Allergies  Allergen Reactions   Diphenhydramine Other (See Comments)    Makes her hyperactive    Morphine And Codeine Itching    Patient states that she can still take this medication it just makes her itch.    Social History:   Social History   Socioeconomic History   Marital status: Single    Spouse name: Not on file   Number of children: Not on file   Years of education: Not on file   Highest education level: Not on file  Occupational History   Occupation: RETIRED  Tobacco Use   Smoking status: Former    Current packs/day: 0.00    Average packs/day: 2.0 packs/day for 37.0 years (74.0 ttl pk-yrs)    Types: Cigarettes    Start date: 09/18/1962    Quit  date: 09/19/1999    Years since quitting: 25.0   Smokeless tobacco: Never  Vaping Use   Vaping status: Never Used  Substance and Sexual Activity   Alcohol use: Yes    Comment: rare   Drug use: No   Sexual activity: Not on file  Other Topics Concern   Not on file  Social History Narrative   Lives alone with one cat   Social Drivers of Health   Financial Resource Strain: Low Risk  (03/06/2024)   Overall Financial Resource Strain (CARDIA)    Difficulty of Paying Living Expenses: Not hard at all  Food Insecurity: No Food Insecurity (03/06/2024)   Hunger Vital Sign    Worried About Running Out of Food  in the Last Year: Never true    Ran Out of Food in the Last Year: Never true  Transportation Needs: No Transportation Needs (03/06/2024)   PRAPARE - Administrator, Civil Service (Medical): No    Lack of Transportation (Non-Medical): No  Physical Activity: Inactive (03/06/2024)   Exercise Vital Sign    Days of Exercise per Week: 0 days    Minutes of Exercise per Session: 0 min  Stress: Stress Concern Present (03/06/2024)   [employer] of Occupational Health - Occupational Stress Questionnaire    Feeling of Stress : To some extent  Social Connections: Socially Isolated (03/06/2024)   Social Connection and Isolation Panel    Frequency of Communication with Friends and Family: More than three times a week    Frequency of Social Gatherings with Friends and Family: Three times a week    Attends Religious Services: Never    Active Member of Clubs or Organizations: No    Attends Banker Meetings: Never    Marital Status: Never married  Intimate Partner Violence: Patient Unable To Answer (03/06/2024)   Humiliation, Afraid, Rape, and Kick questionnaire    Fear of Current or Ex-Partner: Patient unable to answer    Emotionally Abused: Patient unable to answer    Physically Abused: Patient unable to answer    Sexually Abused: Patient unable to answer    Family History:    Family History  Problem Relation Age of Onset   Cancer Mother    Diabetes Mother    Heart disease Mother    Hyperlipidemia Mother    Hypertension Mother    Hyperlipidemia Father    Hypertension Father    Colon cancer Maternal Uncle 76   Colon cancer Maternal Uncle 80   Heart disease Other    Diabetes Other        diabeties   Hyperlipidemia Other    Hypertension Other    Colon cancer Maternal Aunt 70     ROS:  Please see the history of present illness.  No F/C, productive cough or hemoptysis. All other ROS reviewed and negative.     Physical Exam/Data: Vitals:   09/22/24 0615 09/22/24  1015 09/22/24 1048 09/22/24 1335  BP: (!) 156/85 138/87  93/67  Pulse: 91 97  89  Resp: 16 12  16   Temp:   (!) 97.1 F (36.2 C)   TempSrc:   Oral   SpO2: 98% 98%  98%    Intake/Output Summary (Last 24 hours) at 09/22/2024 1531 Last data filed at 09/22/2024 0034 Gross per 24 hour  Intake 3 ml  Output --  Net 3 ml      03/06/2024    2:31 PM 09/26/2023   11:52 PM 07/19/2023  1:10 PM  Last 3 Weights  Weight (lbs) 195 lb 195 lb 195 lb 2 oz  Weight (kg) 88.451 kg 88.451 kg 88.508 kg     There is no height or weight on file to calculate BMI.  General:  Well nourished, well developed, in no acute distress HEENT: normal Neck: no JVD Vascular: No carotid bruits; Distal pulses 2+ bilaterally Cardiac:  normal S1, S2; RRR; no murmur  Lungs:  clear to auscultation bilaterally, no wheezing, rhonchi or rales  Abd: soft, nontender, no hepatomegaly  Ext: no edema Musculoskeletal:  No deformities, BUE and BLE strength normal and equal Skin: warm and dry  Neuro: Alert and oriented to person. CNs 2-12 intact, no focal abnormalities noted Psych:  Normal affect   EKG:  The EKG was personally reviewed and demonstrates: NSR, anterior MI. Telemetry:  Telemetry was personally reviewed and demonstrates:  Sinus   Laboratory Data: High Sensitivity Troponin:   Recent Labs  Lab 09/21/24 1819 09/21/24 2057  TROPONINIHS 35* 31*     Chemistry Recent Labs  Lab 09/21/24 1819 09/21/24 1834 09/22/24 1124  NA 136 134* 136  K 3.8 4.0 3.5  CL 100 104 102  CO2 23  --  22  GLUCOSE 175* 171* 151*  BUN 17 22 18   CREATININE 1.18* 1.00 1.15*  CALCIUM  9.7  --  8.7*  GFRNONAA 47*  --  49*  ANIONGAP 13  --  12    Recent Labs  Lab 09/21/24 1819  PROT 6.3*  ALBUMIN 3.3*  AST 27  ALT 16  ALKPHOS 93  BILITOT 2.4*    Hematology Recent Labs  Lab 09/21/24 1819 09/21/24 1834 09/22/24 1124  WBC 12.7*  --  11.0*  RBC 5.91*  --  5.43*  HGB 17.8* 18.0* 16.4*  HCT 52.8* 53.0* 48.9*  MCV 89.3   --  90.1  MCH 30.1  --  30.2  MCHC 33.7  --  33.5  RDW 13.2  --  13.2  PLT 203  --  183    Radiology/Studies:  CT Head Wo Contrast Result Date: 09/21/2024 EXAM: CT HEAD AND CERVICAL SPINE 09/21/2024 08:26:00 PM TECHNIQUE: CT of the head and cervical spine was performed without the administration of intravenous contrast. Multiplanar reformatted images are provided for review. Automated exposure control, iterative reconstruction, and/or weight based adjustment of the mA/kV was utilized to reduce the radiation dose to as low as reasonably achievable. COMPARISON: 09/26/2023. CLINICAL HISTORY: Mental status change, unknown cause. FINDINGS: CT HEAD BRAIN AND VENTRICLES: Periventricular white matter small vessel ischemic changes. Age-related involutional changes. No acute intracranial hemorrhage. No mass effect or midline shift. No abnormal extra-axial fluid collection. No evidence of acute infarct. No hydrocephalus. ORBITS: No acute abnormality. SINUSES AND MASTOIDS: No acute abnormality. SOFT TISSUES AND SKULL: No acute skull fracture. No acute soft tissue abnormality. 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 No acute fracture or traumatic malalignment. DEGENERATIVE CHANGES: Extensive degenerative disc disease with anterior and posterior projecting osteophyte formation and disc space narrowing at each cervical level. SOFT TISSUES: No prevertebral soft tissue swelling. IMPRESSION: 1. No acute intracranial abnormality. 2. Periventricular white matter small vessel ischemic changes and age-related involutional changes. 3. No acute traumatic abnormalities of the cervical spine. 4. Extensive cervical degenerative disc disease with anterior and posterior osteophyte formation and disc space narrowing at each level, with osteoarthritis at C1C2. Electronically signed by: Fonda Field MD 09/21/2024 08:50 PM EST RP Workstation: [id number]   CT Cervical Spine Wo  Contrast Result Date:  09/21/2024 EXAM: CT HEAD AND CERVICAL SPINE 09/21/2024 08:26:00 PM TECHNIQUE: CT of the head and cervical spine was performed without the administration of intravenous contrast. Multiplanar reformatted images are provided for review. Automated exposure control, iterative reconstruction, and/or weight based adjustment of the mA/kV was utilized to reduce the radiation dose to as low as reasonably achievable. COMPARISON: 09/26/2023. CLINICAL HISTORY: Mental status change, unknown cause. FINDINGS: CT HEAD BRAIN AND VENTRICLES: Periventricular white matter small vessel ischemic changes. Age-related involutional changes. No acute intracranial hemorrhage. No mass effect or midline shift. No abnormal extra-axial fluid collection. No evidence of acute infarct. No hydrocephalus. ORBITS: No acute abnormality. SINUSES AND MASTOIDS: No acute abnormality. SOFT TISSUES AND SKULL: No acute skull fracture. No acute soft tissue abnormality. 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 No acute fracture or traumatic malalignment. DEGENERATIVE CHANGES: Extensive degenerative disc disease with anterior and posterior projecting osteophyte formation and disc space narrowing at each cervical level. SOFT TISSUES: No prevertebral soft tissue swelling. IMPRESSION: 1. No acute intracranial abnormality. 2. Periventricular white matter small vessel ischemic changes and age-related involutional changes. 3. No acute traumatic abnormalities of the cervical spine. 4. Extensive cervical degenerative disc disease with anterior and posterior osteophyte formation and disc space narrowing at each level, with osteoarthritis at C1C2. Electronically signed by: Fonda Field MD 09/21/2024 08:50 PM EST RP Workstation: [id number]   DG Chest Portable 1 View Result Date: 09/21/2024 CLINICAL DATA:  Altered mental status. EXAM: PORTABLE CHEST 1 VIEW COMPARISON:  September 26, 2023 FINDINGS: The heart size and mediastinal contours are  within normal limits. There is marked severity calcification of the aortic arch. The lungs are hyperinflated with mild, diffuse, chronic appearing increased interstitial lung markings. A stable 11 mm pulmonary nodule is seen within the mid left lung. No pleural effusion or pneumothorax is identified. The visualized skeletal structures are unremarkable. IMPRESSION: No acute or active cardiopulmonary disease. Electronically Signed   By: Suzen Dials M.D.   On: 09/21/2024 20:14     Assessment and Plan: Elevated troponin-minimal elevation in troponin and patient is not having chest pain.  Will plan echocardiogram to assess LV function.  However at present given altered mental status/possible dementia she is not a candidate for aggressive cardiac procedures. Possible septal infarct on ECG-check echocardiogram for LV function.  If wall motion normal will not plan further evaluation and I do not think she likely is a candidate for aggressive cardiac procedures regardless. Altered mental status/dementia/cognitive impairment-plans per primary care Hyperlipidemia-continue statin. Diabetes mellitus-per primary care   For questions or updates, please contact [facility] Please consult [email or web address] for contact info under    Signed, Redell Shallow, MD  09/22/2024 3:31 PM

## 2024-09-23 ENCOUNTER — Observation Stay (HOSPITAL_COMMUNITY)

## 2024-09-23 DIAGNOSIS — E119 Type 2 diabetes mellitus without complications: Secondary | ICD-10-CM | POA: Diagnosis not present

## 2024-09-23 DIAGNOSIS — E1169 Type 2 diabetes mellitus with other specified complication: Secondary | ICD-10-CM | POA: Diagnosis not present

## 2024-09-23 DIAGNOSIS — R7989 Other specified abnormal findings of blood chemistry: Secondary | ICD-10-CM | POA: Diagnosis not present

## 2024-09-23 DIAGNOSIS — E785 Hyperlipidemia, unspecified: Secondary | ICD-10-CM | POA: Diagnosis not present

## 2024-09-23 DIAGNOSIS — N1831 Chronic kidney disease, stage 3a: Secondary | ICD-10-CM | POA: Diagnosis not present

## 2024-09-23 DIAGNOSIS — Z515 Encounter for palliative care: Secondary | ICD-10-CM | POA: Diagnosis not present

## 2024-09-23 DIAGNOSIS — R55 Syncope and collapse: Secondary | ICD-10-CM | POA: Diagnosis not present

## 2024-09-23 DIAGNOSIS — E1159 Type 2 diabetes mellitus with other circulatory complications: Secondary | ICD-10-CM | POA: Diagnosis not present

## 2024-09-23 DIAGNOSIS — W19XXXA Unspecified fall, initial encounter: Secondary | ICD-10-CM | POA: Diagnosis not present

## 2024-09-23 DIAGNOSIS — R4182 Altered mental status, unspecified: Secondary | ICD-10-CM | POA: Diagnosis not present

## 2024-09-23 LAB — GLUCOSE, CAPILLARY
Glucose-Capillary: 120 mg/dL — ABNORMAL HIGH (ref 70–99)
Glucose-Capillary: 138 mg/dL — ABNORMAL HIGH (ref 70–99)
Glucose-Capillary: 165 mg/dL — ABNORMAL HIGH (ref 70–99)
Glucose-Capillary: 114 mg/dL — ABNORMAL HIGH (ref 70–99)

## 2024-09-23 LAB — ECHOCARDIOGRAM COMPLETE
Area-P 1/2: 4.21 cm2
S' Lateral: 2.5 cm

## 2024-09-23 LAB — HEMOGLOBIN A1C
Hgb A1c MFr Bld: 6.6 % — ABNORMAL HIGH (ref 4.8–5.6)
Mean Plasma Glucose: 142.72 mg/dL

## 2024-09-23 MED ORDER — PERFLUTREN LIPID MICROSPHERE
1.0000 mL | INTRAVENOUS | Status: AC | PRN
Start: 2024-09-23 — End: 2024-09-23
  Administered 2024-09-23: 3 mL via INTRAVENOUS

## 2024-09-23 MED ORDER — ORAL CARE MOUTH RINSE: 15.0000 mL | OROMUCOSAL | Status: DC | PRN

## 2024-09-23 MED ORDER — AMLODIPINE BESYLATE 5 MG PO TABS
5.0000 mg | ORAL_TABLET | Freq: Every day | ORAL | Status: DC
  Administered 2024-09-23 – 2024-09-28 (×5): 5 mg via ORAL
  Filled 2024-09-23 (×6): qty 1

## 2024-09-23 NOTE — [place] FL2 (Signed)
 [place]  MEDICAID FL2 LEVEL OF CARE FORM     IDENTIFICATION  Patient Name: Lynn [name] Birthdate: 1946/03/23 Sex: female Admission Date (Current Location): 09/21/2024  [facility] and [place] Number:  Producer, Television/film/video and Address:  [facility] [place], [address], [place], [place] [address]      Provider Number: [id number]  Attending Physician Name and Address:  Cherlyn Labella, MD  Relative Name and Phone Number:       Current Level of Care: Hospital Recommended Level of Care: Skilled Nursing Facility Prior Approval Number:    Date Approved/Denied:   PASRR Number: [id number]  Discharge Plan: SNF    Current Diagnoses: Patient Active Problem List   Diagnosis Date Noted   Fall at home, initial encounter 09/21/2024   Chronic kidney disease, stage 3a (HCC) 09/21/2024   Lactic acidosis 09/27/2023   Starvation ketoacidosis 09/27/2023   Dehydration 09/27/2023   Ground-level fall 09/26/2023   Hypercalcemia 07/19/2023   Memory change 07/19/2023   Erythrocytosis 10/19/2022   CKD (chronic kidney disease) stage 2, GFR 60-89 ml/min 03/16/2022   Elevated hemoglobin 02/09/2022   Stopped smoking with greater than 40 pack year history 09/01/2016   Lung nodule, solitary 09/01/2016   Insomnia 01/14/2015   Allergic rhinitis 10/13/2013   Dyslipidemia associated with T2DM (HCC) 06/21/2010   DISC DISEASE, LUMBAR 05/25/2009   DIVERTICULOSIS OF COLON 01/11/2009   History of colonic polyps 01/11/2009   T2DM (type 2 diabetes mellitus) (HCC) 03/14/2007   Depression, major, single episode, moderate (HCC) 03/14/2007   HTN associated with DM (HCC) 03/14/2007   Asthma 03/14/2007    Orientation RESPIRATION BLADDER Height & Weight     Self  Normal Incontinent Weight:   Height:     BEHAVIORAL SYMPTOMS/MOOD NEUROLOGICAL BOWEL NUTRITION STATUS      Continent Diet (See DC Summary)  AMBULATORY STATUS COMMUNICATION OF NEEDS Skin   Extensive Assist Verbally  Normal                       Personal Care Assistance Level of Assistance  Bathing, Dressing, Feeding Bathing Assistance: Maximum assistance Feeding assistance: Limited assistance Dressing Assistance: Maximum assistance     Functional Limitations Info  Sight, Hearing, Speech Sight Info: Impaired Hearing Info: Adequate Speech Info: Adequate    SPECIAL CARE FACTORS FREQUENCY  PT (By licensed PT), OT (By licensed OT)     PT Frequency: 5x/week OT Frequency: 5x/week            Contractures Contractures Info: Not present    Additional Factors Info  Code Status, Allergies Code Status Info: DNR-Limited Allergies Info: Diphenhydramine; Morphine and Codeine           Current Medications (09/23/2024):  This is the current hospital active medication list Current Facility-Administered Medications  Medication Dose Route Frequency Provider Last Rate Last Admin   acetaminophen  (TYLENOL ) tablet 650 mg  650 mg Oral Q6H PRN Patel, Vishal R, MD       Or   acetaminophen  (TYLENOL ) suppository 650 mg  650 mg Rectal Q6H PRN Tobie Jorie SAUNDERS, MD       albuterol  (PROVENTIL ) (2.5 MG/3ML) 0.083% nebulizer solution 3 mL  3 mL Inhalation Q6H PRN Patel, Vishal R, MD       bisacodyl (DULCOLAX) EC tablet 5 mg  5 mg Oral Daily PRN Patel, Vishal R, MD       citalopram  (CELEXA ) tablet 20 mg  20 mg Oral Daily Patel, Vishal R,  MD   20 mg at 09/23/24 0906   enoxaparin  (LOVENOX ) injection 40 mg  40 mg Subcutaneous Q24H Patel, Vishal R, MD   40 mg at 09/23/24 9093   haloperidol lactate (HALDOL) injection 2 mg  2 mg Intravenous Q6H PRN Akula, Vijaya, MD       hydrALAZINE (APRESOLINE) injection 10 mg  10 mg Intravenous Q4H PRN Patel, Vishal R, MD       insulin aspart (novoLOG) injection 0-9 Units  0-9 Units Subcutaneous TID WC Patel, Vishal R, MD   1 Units at 09/22/24 1258   melatonin tablet 3 mg  3 mg Oral QHS PRN Patel, Vishal R, MD       ondansetron  (ZOFRAN ) tablet 4 mg  4 mg Oral Q6H PRN Patel,  Vishal R, MD       Or   ondansetron  (ZOFRAN ) injection 4 mg  4 mg Intravenous Q6H PRN Patel, Vishal R, MD       Oral care mouth rinse  15 mL Mouth Rinse PRN Akula, Vijaya, MD       oxyCODONE  (Oxy IR/ROXICODONE ) immediate release tablet 2.5 mg  2.5 mg Oral Q6H PRN Akula, Vijaya, MD   2.5 mg at 09/22/24 1639   senna-docusate (Senokot-S) tablet 1 tablet  1 tablet Oral QHS PRN Patel, Vishal R, MD       sodium chloride  flush (NS) 0.9 % injection 3 mL  3 mL Intravenous Q12H Patel, Vishal R, MD   3 mL at 09/23/24 9085     Discharge Medications: Please see discharge summary for a list of discharge medications.  Relevant Imaging Results:  Relevant Lab Results:   Additional Information SSN: [id number]  Jeoffrey LITTIE Moose, LCSWA

## 2024-09-23 NOTE — Progress Notes (Signed)
 Daily Progress Note   Patient Name: Lynn [name]       Date: 09/23/2024 DOB: Sep 12, 1946  Age: 78 y.o. MRN#: [id number] Attending Physician: Cherlyn Labella, MD Primary Care Physician: Elnor Lauraine BRAVO, NP Admit Date: 09/21/2024 Length of Stay: 0 days  Reason for Consultation/Follow-up: Establishing goals of care  Subjective:   Subjective: MAR reviewed including personal review of recent notes from cardiology and hospitalist.  No new labs or imaging as of this morning.  Echo is pending.  I saw and examined [name].  She is awake and alert.  She is less agitated today.  Reports that she wants to go home.  Her cousins in the room expressed that she is not able to go home as she is too weak and needs to go to rehab facility.  She expressed not wanting to go and I reiterated concern about her safety at home.  She then expressed that she was not happy about it, but she would be willing to go to facility to see if she can regain functional status with her eventual goal being to return to her home.  I expressed the unknown of the future, and that our primary concern was her safety moving forward and  going home is not a safe option.  I then met with her cousins, Verneita and Dayla.  We reviewed a MOST form and discussed how to develop plan of care to focus on her quality of life moving forward and limiting therapies not in line with this goal. We completed MOST form today.  DNR, Limited additional interventions, IVF and ABX if indicated, no feeding tube.   Review of Systems Denies complaints today.   Objective:   Vital Signs:  BP (!) 182/89   Pulse 88   Temp 97.6 F (36.4 C) (Oral)   Resp 18   SpO2 97%   Physical Exam: General: Alert, awake, in no acute distress.  Confused. HEENT: No bruits, no goiter, no JVD Heart: Regular rate and rhythm. No murmur appreciated. Lungs: Good air movement, clear Abdomen: Soft, nontender, nondistended, positive bowel sounds.   Ext: No significant  edema Skin: Warm and dry Neuro: Grossly intact, nonfocal. Psych, confused but not agitated    Imaging: @IMAGES @  I personally reviewed recent imaging.   Assessment & Plan:   Assessment:[name] is a 78 year old female with history of diabetes type 2, hypertension, hyperlipidemia, CKD stage III, asthma, depression, cognitive impairment/suspected dementia admitted after being found down in her home by family.   Recommendations/Plan: # Complex medical decision making/goals of care: Patient does not have insight into her condition.  Met with family, including HCPOA.  We completed MOST form today.  DNR, Limited additional interventions, IVF and ABX if indicated, no feeding tube. If she were to become severely sick, goal per family would be to forgo aggressive therapies and focus on comfort care. Plan for trial of rehab.  She is likely to need LTC as she is not safe to be home alone.  -  Code Status: Limited: Do not attempt resuscitation (DNR) -DNR-LIMITED -Do Not Intubate/DNI   Prognosis: Unable to determine   # Discharge Planning: Skilled Nursing Facility for rehab with Palliative care service follow-up  -  Discussed with: family, including HCPOA  Thank you for allowing the palliative care team to participate in the care [facility] JINNY [facility].  Amaryllis Meissner, MD Palliative Care Provider PMT # [phone]  If patient remains symptomatic despite maximum doses, please call PMT at [phone] between 0700  and 1900. Outside of these hours, please call attending, as PMT does not have night coverage.   I personally spent a total of 55 minutes in the care of the patient today including preparing to see the patient, getting/reviewing separately obtained history, performing a medically appropriate exam/evaluation, counseling and educating, referring and communicating with other health care professionals, and documenting clinical information in the EHR.

## 2024-09-23 NOTE — TOC Initial Note (Signed)
 Transition of Care [facility]) - Initial/Assessment Note    Patient Details  Name: Lynn [name] MRN: [id number] Date of Birth: 26-Jan-1946  [facility] CM/SW Contact:    Jeoffrey LITTIE Moose, [place] Phone Number: 09/23/2024, 9:47 AM  Clinical Narrative:                 Pt admitted from home after being found on the floor in her living room. Pt AMS at baseline. CSW completed SNF workup with pt cousin, Zebedee over the phone. Zebedee stated pt lives alone and is in agreement with SNF following hospital discharge but expressed interest in LTC. CSW explained that once a SNF facility is chosen and pt is admitted, the facility can help facilitate LTC. CSW completed Fl2 and sent out SNF referrals. CSW will continue to follow.  Expected Discharge Plan: Skilled Nursing Facility Barriers to Discharge: Continued Medical Work up, English As A Second Language Teacher, SNF Pending bed offer   Patient Goals and CMS Choice Patient states their goals for this hospitalization and ongoing recovery are:: SNF          Expected Discharge Plan and Services       Living arrangements for the past 2 months: Single Family Home                                      Prior Living Arrangements/Services Living arrangements for the past 2 months: Single Family Home Lives with:: Self Patient language and need for interpreter reviewed:: Yes Do you feel safe going back to the place where you live?: Yes      Need for Family Participation in Patient Care: Yes (Comment) Care giver support system in place?: Yes (comment)   Criminal Activity/Legal Involvement Pertinent to Current Situation/Hospitalization: No - Comment as needed  Activities of Daily Living      Permission Sought/Granted Permission sought to share information with : Facility Medical Sales Representative, Family Supports    Share Information with NAME: Zebedee  Permission granted to share info w AGENCY: SNFs  Permission granted to share info w  Relationship: Cousin  Permission granted to share info w Contact Information: [phone]  Emotional Assessment Appearance:: Appears stated age Attitude/Demeanor/Rapport: Unable to Assess Affect (typically observed): Unable to Assess Orientation: : Oriented to Self Alcohol / Substance Use: Not Applicable Psych Involvement: No (comment)  Admission diagnosis:  Fall at home, initial encounter [W19.XXXA, Y92.009] Altered mental status, unspecified altered mental status type [R41.82] Patient Active Problem List   Diagnosis Date Noted   Fall at home, initial encounter 09/21/2024   Chronic kidney disease, stage 3a (HCC) 09/21/2024   Lactic acidosis 09/27/2023   Starvation ketoacidosis 09/27/2023   Dehydration 09/27/2023   Ground-level fall 09/26/2023   Hypercalcemia 07/19/2023   Memory change 07/19/2023   Erythrocytosis 10/19/2022   CKD (chronic kidney disease) stage 2, GFR 60-89 ml/min 03/16/2022   Elevated hemoglobin 02/09/2022   Stopped smoking with greater than 40 pack year history 09/01/2016   Lung nodule, solitary 09/01/2016   Insomnia 01/14/2015   Allergic rhinitis 10/13/2013   Dyslipidemia associated with T2DM (HCC) 06/21/2010   DISC DISEASE, LUMBAR 05/25/2009   DIVERTICULOSIS OF COLON 01/11/2009   History of colonic polyps 01/11/2009   T2DM (type 2 diabetes mellitus) (HCC) 03/14/2007   Depression, major, single episode, moderate (HCC) 03/14/2007   HTN associated with DM (HCC) 03/14/2007   Asthma 03/14/2007   PCP:  Elnor Lauraine BRAVO, NP  Pharmacy:   [facility] - [facility] [address] [place] [address] Phone: [phone] Fax: [phone]  [id number] [place] [facility] - [address] [place] [address] Phone: [phone] Fax: [phone]  [id number] [place], [place] - [address] [place] [address] Phone: [phone] Fax: [phone]     Social Drivers of Health  (SDOH) Social History: SDOH Screenings   Food Insecurity: Patient Unable To Answer (09/22/2024)  Housing: Patient Unable To Answer (09/22/2024)  Transportation Needs: Patient Unable To Answer (09/22/2024)  Utilities: Patient Unable To Answer (09/22/2024)  Alcohol Screen: Low Risk  (03/06/2024)  Depression (PHQ2-9): Low Risk  (03/06/2024)  Financial Resource Strain: Low Risk  (03/06/2024)  Physical Activity: Inactive (03/06/2024)  Social Connections: Unknown (09/22/2024)  Stress: Stress Concern Present (03/06/2024)  Tobacco Use: Medium Risk (09/21/2024)  Health Literacy: Adequate Health Literacy (03/06/2024)   SDOH Interventions:     Readmission Risk Interventions    09/28/2023    2:44 PM  Readmission Risk Prevention Plan  Post Dischage Appt Complete  Medication Screening Complete  Transportation Screening Complete

## 2024-09-23 NOTE — Progress Notes (Signed)
 Mobility Specialist Progress Note:    09/23/24 1045  Mobility  Activity Dangled on edge of bed  Level of Assistance Maximum assist, patient does 25-49% (+2)  Activity Response Tolerated fair  Mobility Referral Yes  Mobility visit 1 Mobility  Mobility Specialist Start Time (ACUTE ONLY) 1030  Mobility Specialist Stop Time (ACUTE ONLY) 1045  Mobility Specialist Time Calculation (min) (ACUTE ONLY) 15 min   Received pt in bed and eager for OOB. Pt required MaxA +2 to EOB. Pt c/o back and RLE pain. Upon sitting EOB, pt changed her mind and requested to lay back down. Assisted pt supine in bed with alarm on. Personal belongings and call light within reach. All needs met.  Lavanda Pollack Mobility Specialist  Please contact via [employer] or  Rehab Office [phone]

## 2024-09-23 NOTE — Progress Notes (Signed)
 [facility]                                                                               Lynn [name], is a 78 y.o. female, DOB - [birth year], FMW:[id number] Admit date - 09/21/2024    Outpatient Primary MD for the patient is Lynn Lauraine BRAVO, NP  LOS - 0  days    Brief summary   Lynn [name] is a 78 y.o. female with medical history significant for T2DM, HTN, HLD, CKD stage III, asthma, depression, cognitive impairment/suspected underlying dementia who is admitted after unwitnessed fall at home with prolonged downtime. She was admitted for mild rhabdomyolysis  Assessment & Plan    Assessment and Plan:   Unwitnessed fall at home Mild rhabdomyolysis CK on admission at 321 improved to 159 with IV fluids. Therapy evaluations ordered.     Mildly elevated troponins Patient currently denies any chest pain or shortness of breath EKG shows sinus rhythm with mild ST elevation in the lateral leads.  Family concerned about her having a MI.  Echocardiogram ordered.  Cardiology consulted. Not a candidate for aggressive management.    Type 2 DM Continue with SSI.  CBG (last 3)  Recent Labs    09/22/24 2222 09/23/24 0814 09/23/24 1159  GLUCAP 119* 120* 138*   Will check A1c.   Underlying dementia/ cognitive impairment. With intermittent agitation.  Patient on IV haldol.     Depression Resume celexa .    Hyperlipidemia On statin at home.    Stage 3a ckd Creatinine at baseline.    Mild Leukocytosis.  Rule out UTI. Get UA CXR is negative for pneumonia.    Hypertensive urgency Added norvasc and prn hydralazine.   As per the family patient does not take any  meds at home, though she is on several meds.  In view of her worsening debility , deconditioning and refusal to take medications, will request palliative medicine consult for goals of care.  She will also need placement as she is not safe to be by herself.    Estimated body mass  index is 30.54 kg/m as calculated from the following:   Height as of 03/28/24: 5' 7 (1.702 m).   Weight as of 03/06/24: 88.5 kg.  Code Status: DNR Confirmed.  DVT Prophylaxis:  enoxaparin  (LOVENOX ) injection 40 mg Start: 09/22/24 2200   Level of Care: Level of care: Telemetry Family Communication: Updated patient's family at bedside.   Disposition Plan:     Remains inpatient appropriate:  pending further work up   Procedures:  None  Consultants:   Palliative care consult Cardiology.   Antimicrobials:   Anti-infectives (From admission, onward)    None        Medications  Scheduled Meds:  amLODipine  5 mg Oral Daily   citalopram   20 mg Oral Daily   enoxaparin  (LOVENOX ) injection  40 mg Subcutaneous Q24H   insulin aspart  0-9 Units Subcutaneous TID WC   sodium chloride  flush  3 mL Intravenous Q12H   Continuous Infusions: PRN Meds:.acetaminophen  **OR** acetaminophen , albuterol , bisacodyl, haloperidol lactate, hydrALAZINE, melatonin, ondansetron  **OR** ondansetron  (ZOFRAN ) IV, mouth rinse, oxyCODONE , senna-docusate    Subjective:  Lynn [name] was seen and examined today.  Still upset about the beds. Wants regular bed.   Objective:   Vitals:   09/22/24 2259 09/23/24 0416 09/23/24 0900 09/23/24 1016  BP: (!) 153/98 (!) 177/86 (!) 187/101 (!) 182/89  Pulse: 94 93 95 88  Resp: 14 20 18    Temp:  98.4 F (36.9 C) 98.2 F (36.8 C) 97.6 F (36.4 C)  TempSrc: Oral Oral Oral Oral  SpO2: 96% 97% 98% 97%   No intake or output data in the 24 hours ending 09/23/24 1543  There were no vitals filed for this visit.   Exam General exam: Appears calm and comfortable  Respiratory system: Clear to auscultation. Respiratory effort normal. Cardiovascular system: S1 & S2 heard, RRR.  Gastrointestinal system: Abdomen is nondistended, soft and nontender. Central nervous system: Alert and oriented to person and place.  Extremities: no edema.  Skin: No rashes,   Psychiatry: restless.      Data Reviewed:  I have personally reviewed following labs and imaging studies   CBC Lab Results  Component Value Date   WBC 11.0 (H) 09/22/2024   RBC 5.43 (H) 09/22/2024   HGB 16.4 (H) 09/22/2024   HCT 48.9 (H) 09/22/2024   MCV 90.1 09/22/2024   MCH 30.2 09/22/2024   PLT 183 09/22/2024   MCHC 33.5 09/22/2024   RDW 13.2 09/22/2024   LYMPHSABS 1.0 09/21/2024   MONOABS 0.9 09/21/2024   EOSABS 0.0 09/21/2024   BASOSABS 0.0 09/21/2024     Last metabolic panel Lab Results  Component Value Date   NA 136 09/22/2024   K 3.5 09/22/2024   CL 102 09/22/2024   CO2 22 09/22/2024   BUN 18 09/22/2024   CREATININE 1.15 (H) 09/22/2024   GLUCOSE 151 (H) 09/22/2024   GFRNONAA 49 (L) 09/22/2024   GFRAA 81 (L) 12/08/2014   CALCIUM  8.7 (L) 09/22/2024   PHOS 2.2 (L) 09/29/2023   PROT 6.3 (L) 09/21/2024   ALBUMIN 3.3 (L) 09/21/2024   BILITOT 2.4 (H) 09/21/2024   ALKPHOS 93 09/21/2024   AST 27 09/21/2024   ALT 16 09/21/2024   ANIONGAP 12 09/22/2024    CBG (last 3)  Recent Labs    09/22/24 2222 09/23/24 0814 09/23/24 1159  GLUCAP 119* 120* 138*      Coagulation Profile: No results for input(s): INR, PROTIME in the last 168 hours.   Radiology Studies: DG Lumbar Spine 2-3 Views Result Date: 09/22/2024 CLINICAL DATA:  Fall EXAM: LUMBAR SPINE - 2-3 VIEW COMPARISON:  None Available. FINDINGS: The bones are osteopenic. No acute fracture or dislocation identified. Alignment is anatomic. There is mild disc space narrowing and endplate osteophyte formation throughout the lumbar spine compatible with degenerative change. There severe atherosclerotic calcifications of the aorta. IMPRESSION: 1. No acute fracture or dislocation. 2. Mild degenerative changes. Electronically Signed   By: Greig Pique M.D.   On: 09/22/2024 18:46   CT Head Wo Contrast Result Date: 09/21/2024 EXAM: CT HEAD AND CERVICAL SPINE 09/21/2024 08:26:00 PM TECHNIQUE: CT of the head  and cervical spine was performed without the administration of intravenous contrast. Multiplanar reformatted images are provided for review. Automated exposure control, iterative reconstruction, and/or weight based adjustment of the mA/kV was utilized to reduce the radiation dose to as low as reasonably achievable. COMPARISON: 09/26/2023. CLINICAL HISTORY: Mental status change, unknown cause. FINDINGS: CT HEAD BRAIN AND VENTRICLES: Periventricular white matter small vessel ischemic changes. Age-related involutional changes. No acute intracranial hemorrhage. No mass effect or midline shift.  No abnormal extra-axial fluid collection. No evidence of acute infarct. No hydrocephalus. ORBITS: No acute abnormality. SINUSES AND MASTOIDS: No acute abnormality. SOFT TISSUES AND SKULL: No acute skull fracture. No acute soft tissue abnormality. 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 No acute fracture or traumatic malalignment. DEGENERATIVE CHANGES: Extensive degenerative disc disease with anterior and posterior projecting osteophyte formation and disc space narrowing at each cervical level. SOFT TISSUES: No prevertebral soft tissue swelling. IMPRESSION: 1. No acute intracranial abnormality. 2. Periventricular white matter small vessel ischemic changes and age-related involutional changes. 3. No acute traumatic abnormalities of the cervical spine. 4. Extensive cervical degenerative disc disease with anterior and posterior osteophyte formation and disc space narrowing at each level, with osteoarthritis at C1C2. Electronically signed by: Fonda Field MD 09/21/2024 08:50 PM EST RP Workstation: [id number]   CT Cervical Spine Wo Contrast Result Date: 09/21/2024 EXAM: CT HEAD AND CERVICAL SPINE 09/21/2024 08:26:00 PM TECHNIQUE: CT of the head and cervical spine was performed without the administration of intravenous contrast. Multiplanar reformatted images are provided for review. Automated exposure  control, iterative reconstruction, and/or weight based adjustment of the mA/kV was utilized to reduce the radiation dose to as low as reasonably achievable. COMPARISON: 09/26/2023. CLINICAL HISTORY: Mental status change, unknown cause. FINDINGS: CT HEAD BRAIN AND VENTRICLES: Periventricular white matter small vessel ischemic changes. Age-related involutional changes. No acute intracranial hemorrhage. No mass effect or midline shift. No abnormal extra-axial fluid collection. No evidence of acute infarct. No hydrocephalus. ORBITS: No acute abnormality. SINUSES AND MASTOIDS: No acute abnormality. SOFT TISSUES AND SKULL: No acute skull fracture. No acute soft tissue abnormality. 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 No acute fracture or traumatic malalignment. DEGENERATIVE CHANGES: Extensive degenerative disc disease with anterior and posterior projecting osteophyte formation and disc space narrowing at each cervical level. SOFT TISSUES: No prevertebral soft tissue swelling. IMPRESSION: 1. No acute intracranial abnormality. 2. Periventricular white matter small vessel ischemic changes and age-related involutional changes. 3. No acute traumatic abnormalities of the cervical spine. 4. Extensive cervical degenerative disc disease with anterior and posterior osteophyte formation and disc space narrowing at each level, with osteoarthritis at C1C2. Electronically signed by: Fonda Field MD 09/21/2024 08:50 PM EST RP Workstation: [id number]   DG Chest Portable 1 View Result Date: 09/21/2024 CLINICAL DATA:  Altered mental status. EXAM: PORTABLE CHEST 1 VIEW COMPARISON:  September 26, 2023 FINDINGS: The heart size and mediastinal contours are within normal limits. There is marked severity calcification of the aortic arch. The lungs are hyperinflated with mild, diffuse, chronic appearing increased interstitial lung markings. A stable 11 mm pulmonary nodule is seen within the mid left lung. No  pleural effusion or pneumothorax is identified. The visualized skeletal structures are unremarkable. IMPRESSION: No acute or active cardiopulmonary disease. Electronically Signed   By: Suzen Dials M.D.   On: 09/21/2024 20:14       Elgie Butter M.D. [facility] 09/23/2024, 3:43 PM  Available via Epic secure chat 7am-7pm After 7 pm, please refer to night coverage provider listed on amion.

## 2024-09-23 NOTE — Plan of Care (Signed)
 Pt calm and cooperative with intermittent irritability when asking to perform a task.   Problem: Education: Goal: Ability to describe self-care measures that may prevent or decrease complications (Diabetes Survival Skills Education) will improve Outcome: Progressing   Problem: Coping: Goal: Ability to adjust to condition or change in health will improve Outcome: Progressing   Problem: Fluid Volume: Goal: Ability to maintain a balanced intake and output will improve Outcome: Progressing   Problem: Nutritional: Goal: Maintenance of adequate nutrition will improve Outcome: Progressing   Problem: Skin Integrity: Goal: Risk for impaired skin integrity will decrease Outcome: Progressing

## 2024-09-23 NOTE — Progress Notes (Signed)
 Rounding Note   Patient Name: Lynn [name] Date of Encounter: 09/23/2024  [facility] Cardiologist: New  Subjective Denies CP or dyspnea; alert and oriented x 2 (does not know time)  Scheduled Meds:  citalopram   20 mg Oral Daily   enoxaparin  (LOVENOX ) injection  40 mg Subcutaneous Q24H   insulin aspart  0-9 Units Subcutaneous TID WC   sodium chloride  flush  3 mL Intravenous Q12H   Continuous Infusions:  PRN Meds: acetaminophen  **OR** acetaminophen , albuterol , bisacodyl, haloperidol lactate, hydrALAZINE, melatonin, ondansetron  **OR** ondansetron  (ZOFRAN ) IV, mouth rinse, oxyCODONE , senna-docusate   Vital Signs  Vitals:   09/22/24 2214 09/22/24 2223 09/22/24 2259 09/23/24 0416  BP:  132/89 (!) 153/98 (!) 177/86  Pulse:  91 94 93  Resp:  18 14 20   Temp: 98.7 F (37.1 C)  98.5 F (36.9 C) 98.4 F (36.9 C)  TempSrc: Oral  Oral Oral  SpO2:  96% 96% 97%   No intake or output data in the 24 hours ending 09/23/24 0832    03/06/2024    2:31 PM 09/26/2023   11:52 PM 07/19/2023    1:10 PM  Last 3 Weights  Weight (lbs) 195 lb 195 lb 195 lb 2 oz  Weight (kg) 88.451 kg 88.451 kg 88.508 kg      Telemetry Sinus- Personally Reviewed   Physical Exam  GEN: No acute distress.   Neck: No JVD Cardiac: RRR, no murmurs, rubs, or gallops.  Respiratory: Clear to auscultation bilaterally. GI: Soft, nontender, non-distended  MS: No edema Neuro:  Nonfocal; alert and oriented x 2 Psych: Normal affect   Labs High Sensitivity Troponin:   Recent Labs  Lab 09/21/24 1819 09/21/24 2057  TROPONINIHS 35* 31*     Chemistry Recent Labs  Lab 09/21/24 1819 09/21/24 1834 09/22/24 1124  NA 136 134* 136  K 3.8 4.0 3.5  CL 100 104 102  CO2 23  --  22  GLUCOSE 175* 171* 151*  BUN 17 22 18   CREATININE 1.18* 1.00 1.15*  CALCIUM  9.7  --  8.7*  PROT 6.3*  --   --   ALBUMIN 3.3*  --   --   AST 27  --   --   ALT 16  --   --   ALKPHOS 93  --   --   BILITOT 2.4*  --    --   GFRNONAA 47*  --  49*  ANIONGAP 13  --  12     Hematology Recent Labs  Lab 09/21/24 1819 09/21/24 1834 09/22/24 1124  WBC 12.7*  --  11.0*  RBC 5.91*  --  5.43*  HGB 17.8* 18.0* 16.4*  HCT 52.8* 53.0* 48.9*  MCV 89.3  --  90.1  MCH 30.1  --  30.2  MCHC 33.7  --  33.5  RDW 13.2  --  13.2  PLT 203  --  183    Radiology  DG Lumbar Spine 2-3 Views Result Date: 09/22/2024 CLINICAL DATA:  Fall EXAM: LUMBAR SPINE - 2-3 VIEW COMPARISON:  None Available. FINDINGS: The bones are osteopenic. No acute fracture or dislocation identified. Alignment is anatomic. There is mild disc space narrowing and endplate osteophyte formation throughout the lumbar spine compatible with degenerative change. There severe atherosclerotic calcifications of the aorta. IMPRESSION: 1. No acute fracture or dislocation. 2. Mild degenerative changes. Electronically Signed   By: Greig Pique M.D.   On: 09/22/2024 18:46   CT Head Wo Contrast Result Date: 09/21/2024 EXAM: CT HEAD  AND CERVICAL SPINE 09/21/2024 08:26:00 PM TECHNIQUE: CT of the head and cervical spine was performed without the administration of intravenous contrast. Multiplanar reformatted images are provided for review. Automated exposure control, iterative reconstruction, and/or weight based adjustment of the mA/kV was utilized to reduce the radiation dose to as low as reasonably achievable. COMPARISON: 09/26/2023. CLINICAL HISTORY: Mental status change, unknown cause. FINDINGS: CT HEAD BRAIN AND VENTRICLES: Periventricular white matter small vessel ischemic changes. Age-related involutional changes. No acute intracranial hemorrhage. No mass effect or midline shift. No abnormal extra-axial fluid collection. No evidence of acute infarct. No hydrocephalus. ORBITS: No acute abnormality. SINUSES AND MASTOIDS: No acute abnormality. SOFT TISSUES AND SKULL: No acute skull fracture. No acute soft tissue abnormality. 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 No acute fracture or traumatic malalignment. DEGENERATIVE CHANGES: Extensive degenerative disc disease with anterior and posterior projecting osteophyte formation and disc space narrowing at each cervical level. SOFT TISSUES: No prevertebral soft tissue swelling. IMPRESSION: 1. No acute intracranial abnormality. 2. Periventricular white matter small vessel ischemic changes and age-related involutional changes. 3. No acute traumatic abnormalities of the cervical spine. 4. Extensive cervical degenerative disc disease with anterior and posterior osteophyte formation and disc space narrowing at each level, with osteoarthritis at C1C2. Electronically signed by: Fonda Field MD 09/21/2024 08:50 PM EST RP Workstation: [id number]   CT Cervical Spine Wo Contrast Result Date: 09/21/2024 EXAM: CT HEAD AND CERVICAL SPINE 09/21/2024 08:26:00 PM TECHNIQUE: CT of the head and cervical spine was performed without the administration of intravenous contrast. Multiplanar reformatted images are provided for review. Automated exposure control, iterative reconstruction, and/or weight based adjustment of the mA/kV was utilized to reduce the radiation dose to as low as reasonably achievable. COMPARISON: 09/26/2023. CLINICAL HISTORY: Mental status change, unknown cause. FINDINGS: CT HEAD BRAIN AND VENTRICLES: Periventricular white matter small vessel ischemic changes. Age-related involutional changes. No acute intracranial hemorrhage. No mass effect or midline shift. No abnormal extra-axial fluid collection. No evidence of acute infarct. No hydrocephalus. ORBITS: No acute abnormality. SINUSES AND MASTOIDS: No acute abnormality. SOFT TISSUES AND SKULL: No acute skull fracture. No acute soft tissue abnormality. 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 No acute fracture or traumatic malalignment. DEGENERATIVE CHANGES: Extensive degenerative disc disease with anterior and posterior  projecting osteophyte formation and disc space narrowing at each cervical level. SOFT TISSUES: No prevertebral soft tissue swelling. IMPRESSION: 1. No acute intracranial abnormality. 2. Periventricular white matter small vessel ischemic changes and age-related involutional changes. 3. No acute traumatic abnormalities of the cervical spine. 4. Extensive cervical degenerative disc disease with anterior and posterior osteophyte formation and disc space narrowing at each level, with osteoarthritis at C1C2. Electronically signed by: Fonda Field MD 09/21/2024 08:50 PM EST RP Workstation: [id number]   DG Chest Portable 1 View Result Date: 09/21/2024 CLINICAL DATA:  Altered mental status. EXAM: PORTABLE CHEST 1 VIEW COMPARISON:  September 26, 2023 FINDINGS: The heart size and mediastinal contours are within normal limits. There is marked severity calcification of the aortic arch. The lungs are hyperinflated with mild, diffuse, chronic appearing increased interstitial lung markings. A stable 11 mm pulmonary nodule is seen within the mid left lung. No pleural effusion or pneumothorax is identified. The visualized skeletal structures are unremarkable. IMPRESSION: No acute or active cardiopulmonary disease. Electronically Signed   By: Suzen Dials M.D.   On: 09/21/2024 20:14    Patient Profile   78 y.o. female with a hx of diabetes  mellitus, hypertension, hyperlipidemia, chronic stage III kidney disease, cognitive impairment/suspected underlying dementia, depression, asthma admitted with a fall who is being seen 09/22/2024 for the evaluation of abnormal ECG and elevated troponin. Patient was admitted after being found on her floor and was noted to have altered mental status. She apparently has had progressive decline in ability to care for self as well as decreased memory. She has not been bathing herself. Troponins were checked and were minimally elevated. Her electrocardiogram also was abnormal and cardiology  asked to evaluate.   Assessment & Plan  Elevated troponin-minimal elevation in troponin and patient is not having chest pain.  Will await echocardiogram to assess LV function.  However at present given altered mental status/possible dementia she is not a candidate for aggressive cardiac procedures. Possible septal infarct on ECG-check echocardiogram for LV function.  If wall motion normal will not plan further evaluation and I do not think she likely is a candidate for aggressive cardiac procedures regardless. Altered mental status/dementia/cognitive impairment-plans per primary care Hyperlipidemia-would resume Lipitor at discharge. Diabetes mellitus-per primary care For questions or updates, please contact [facility]     Signed, Redell Shallow, MD  09/23/2024, 8:32 AM

## 2024-09-23 NOTE — Care Management Obs Status (Signed)
 MEDICARE OBSERVATION STATUS NOTIFICATION   Patient Details  Name: AOI KOUNS MRN: [id number] Date of Birth: 03/30/1946   Medicare Observation Status Notification Given:  Yes  Pt unable to verify understanding and CM has not been able to connect with family members in the pt's contacts list within the past 24 hours. Every effort has been made to notify pt or family of obs status. Written copy of obs notification left at pt's bedside and is available in MyChart.   Nena LITTIE Coffee, RN 09/23/2024, 11:51 AM

## 2024-09-24 DIAGNOSIS — N1831 Chronic kidney disease, stage 3a: Secondary | ICD-10-CM | POA: Diagnosis not present

## 2024-09-24 LAB — BASIC METABOLIC PANEL WITH GFR
Anion gap: 14 (ref 5–15)
BUN: 14 mg/dL (ref 8–23)
CO2: 22 mmol/L (ref 22–32)
Calcium: 9 mg/dL (ref 8.9–10.3)
Chloride: 101 mmol/L (ref 98–111)
Creatinine, Ser: 1.01 mg/dL — ABNORMAL HIGH (ref 0.44–1.00)
GFR, Estimated: 57 mL/min — ABNORMAL LOW (ref >60)
Glucose, Bld: 119 mg/dL — ABNORMAL HIGH (ref 70–99)
Potassium: 3.3 mmol/L — ABNORMAL LOW (ref 3.5–5.1)
Sodium: 137 mmol/L (ref 135–145)

## 2024-09-24 LAB — CBC WITH DIFFERENTIAL/PLATELET
Abs Immature Granulocytes: 0.03 K/uL (ref 0.00–0.07)
Basophils Absolute: 0.1 K/uL (ref 0.0–0.1)
Basophils Relative: 1 %
Eosinophils Absolute: 0.1 K/uL (ref 0.0–0.5)
Eosinophils Relative: 1 %
HCT: 47.7 % — ABNORMAL HIGH (ref 36.0–46.0)
Hemoglobin: 16.1 g/dL — ABNORMAL HIGH (ref 12.0–15.0)
Immature Granulocytes: 0 %
Lymphocytes Relative: 19 %
Lymphs Abs: 1.3 K/uL (ref 0.7–4.0)
MCH: 30 pg (ref 26.0–34.0)
MCHC: 33.8 g/dL (ref 30.0–36.0)
MCV: 89 fL (ref 80.0–100.0)
Monocytes Absolute: 0.7 K/uL (ref 0.1–1.0)
Monocytes Relative: 11 %
Neutro Abs: 4.6 K/uL (ref 1.7–7.7)
Neutrophils Relative %: 68 %
Platelets: 185 K/uL (ref 150–400)
RBC: 5.36 MIL/uL — ABNORMAL HIGH (ref 3.87–5.11)
RDW: 13.2 % (ref 11.5–15.5)
WBC: 6.9 K/uL (ref 4.0–10.5)
nRBC: 0 % (ref 0.0–0.2)

## 2024-09-24 LAB — GLUCOSE, CAPILLARY
Glucose-Capillary: 121 mg/dL — ABNORMAL HIGH (ref 70–99)
Glucose-Capillary: 122 mg/dL — ABNORMAL HIGH (ref 70–99)
Glucose-Capillary: 143 mg/dL — ABNORMAL HIGH (ref 70–99)
Glucose-Capillary: 191 mg/dL — ABNORMAL HIGH (ref 70–99)

## 2024-09-24 LAB — MAGNESIUM: Magnesium: 1.8 mg/dL (ref 1.7–2.4)

## 2024-09-24 MED ORDER — MAGNESIUM SULFATE 2 GM/50ML IV SOLN
2.0000 g | Freq: Once | INTRAVENOUS | Status: AC
  Administered 2024-09-24: 2 g via INTRAVENOUS
  Filled 2024-09-24: qty 50

## 2024-09-24 MED ORDER — AMLODIPINE BESYLATE 5 MG PO TABS: 5.0000 mg | ORAL_TABLET | Freq: Every day | ORAL | Status: DC

## 2024-09-24 MED ORDER — POTASSIUM CHLORIDE CRYS ER 20 MEQ PO TBCR
40.0000 meq | EXTENDED_RELEASE_TABLET | Freq: Once | ORAL | Status: AC
  Administered 2024-09-24: 40 meq via ORAL
  Filled 2024-09-24: qty 2

## 2024-09-24 NOTE — TOC Progression Note (Addendum)
 Transition of Care [facility]) - Progression Note    Patient Details  Name: Lynn [name] MRN: [id number] Date of Birth: 01-May-1946  [facility] CM/SW Contact  Lynn [name], [place] Phone Number: 09/24/2024, 12:24 PM  Clinical Narrative:    CSW met with pt cousin, Lynn [name], at bedside and provided SNF options. Lynn [name] stated [facility] was her preferred choice. CSW messaged Brittany with [facility] to confirm bed availability and Brittany informed CSW that they rescinded their bed offer due to [place]. CSW contacted Lynn [name] and informed her the only other bed offer is [facility]. Lynn [name] was disappointed but agreed to [employer] CSW will initiate insurance auth process and continue to follow.   Expected Discharge Plan: Skilled Nursing Facility Barriers to Discharge: Continued Medical Work up, English As A Second Language Teacher, SNF Pending bed offer               Expected Discharge Plan and Services       Living arrangements for the past 2 months: Single Family Home                                       Social Drivers of Health (SDOH) Interventions SDOH Screenings   Food Insecurity: Patient Unable To Answer (09/22/2024)  Housing: Low Risk  (09/23/2024)  Transportation Needs: Patient Unable To Answer (09/22/2024)  Utilities: Patient Unable To Answer (09/22/2024)  Alcohol Screen: Low Risk  (03/06/2024)  Depression (PHQ2-9): Low Risk  (03/06/2024)  Financial Resource Strain: Low Risk  (03/06/2024)  Physical Activity: Inactive (03/06/2024)  Social Connections: Unknown (09/22/2024)  Stress: Stress Concern Present (03/06/2024)  Tobacco Use: Medium Risk (09/21/2024)  Health Literacy: Adequate Health Literacy (03/06/2024)    Readmission Risk Interventions    09/28/2023    2:44 PM  Readmission Risk Prevention Plan  Post Dischage Appt Complete  Medication Screening Complete  Transportation Screening Complete

## 2024-09-24 NOTE — Progress Notes (Signed)
 [facility]                                                                               Lynn [name], is a 78 y.o. female, DOB - 05/01/1946, FMW:[id number] Admit date - 09/21/2024    Outpatient Primary MD for the patient is Elnor Lauraine BRAVO, NP  LOS - 0  days    Brief summary   Lynn [name] is a 78 y.o. female with medical history significant for T2DM, HTN, HLD, CKD stage III, asthma, depression, cognitive impairment/suspected underlying dementia who is admitted after unwitnessed fall at home with prolonged downtime. She was admitted for mild rhabdomyolysis  Assessment & Plan   Unwitnessed fall at home Mild rhabdomyolysis CK on admission at 321 improved to 159 with IV fluids.  Mildly elevated troponins Patient currently denies any chest pain or shortness of breath EKG shows sinus rhythm with mild ST elevation in the lateral leads.  Echocardiogram shows normal LVEF without any wall motion abnormalities. See cardiology.  No plans for further cardiac testing at this time.  Type 2 DM Continue with SSI.  HbA1c 6.6. Monitor CBGs.   Underlying dementia/ cognitive impairment. With intermittent agitation.  Stable for the most part.  Depression Resume celexa .   Hyperlipidemia On statin at home.   Stage 3a ckd Creatinine at baseline.   Mild Leukocytosis.  Afebrile.  WBC is noted to be normal today.  Was likely reactive.    Hypertensive urgency Patient noted to be on amlodipine  and as needed hydralazine .  Blood pressure is better but still elevated.  May need to increase the dose of amlodipine .    Goals of care Seen by palliative care.  Now DNR.  Code Status: DNR.  DVT Prophylaxis:  enoxaparin  (LOVENOX ) injection 40 mg Start: 09/22/24 2200 Level of Care: Level of care: Telemetry Family Communication: No family at bedside today  Procedures:  None  Consultants:   Palliative care consult Cardiology.    Medications  Scheduled Meds:   amLODipine   5 mg Oral Daily   citalopram   20 mg Oral Daily   enoxaparin  (LOVENOX ) injection  40 mg Subcutaneous Q24H   insulin  aspart  0-9 Units Subcutaneous TID WC   potassium chloride   40 mEq Oral Once   sodium chloride  flush  3 mL Intravenous Q12H   Continuous Infusions: PRN Meds:.acetaminophen  **OR** acetaminophen , albuterol , bisacodyl , haloperidol  lactate, hydrALAZINE , melatonin, ondansetron  **OR** ondansetron  (ZOFRAN ) IV, mouth rinse, oxyCODONE , senna-docusate    Subjective:   Patient does not want to be bothered.  Denies any pain issues.  No nausea or vomiting.  Objective:   Vitals:   09/23/24 1816 09/23/24 1824 09/23/24 2146 09/24/24 1042  BP: (!) 182/89 (!) 154/91 (!) 149/85 (!) 168/95  Pulse: 88 100 96 93  Resp: 18 18 17 16   Temp: 97.6 F (36.4 C) 98 F (36.7 C) 98.4 F (36.9 C) 98.9 F (37.2 C)  TempSrc: Oral  Oral Oral  SpO2:  96% 96% 95%  Weight: 87.2 kg     Height: 5' 6 (1.676 m)       Intake/Output Summary (Last 24 hours) at 09/24/2024 1145 Last data filed at 09/24/2024 0523 Gross per 24  hour  Intake --  Output 600 ml  Net -600 ml    Filed Weights   09/23/24 1816  Weight: 87.2 kg   General appearance: Awake alert.  In no distress.  Distracted. Resp: Clear to auscultation bilaterally.  Normal effort Cardio: S1-S2 is normal regular.  No S3-S4.  No rubs murmurs or bruit GI: Abdomen is soft.  Nontender nondistended.  Bowel sounds are present normal.  No masses organomegaly Extremities: No edema.  Full range of motion of lower extremities. Neurologic: No focal neurological deficits.     Data Reviewed:     CBC Lab Results  Component Value Date   WBC 6.9 09/24/2024   RBC 5.36 (H) 09/24/2024   HGB 16.1 (H) 09/24/2024   HCT 47.7 (H) 09/24/2024   MCV 89.0 09/24/2024   MCH 30.0 09/24/2024   PLT 185 09/24/2024   MCHC 33.8 09/24/2024   RDW 13.2 09/24/2024   LYMPHSABS 1.3 09/24/2024   MONOABS 0.7 09/24/2024   EOSABS 0.1 09/24/2024   BASOSABS  0.1 09/24/2024     Last metabolic panel Lab Results  Component Value Date   NA 137 09/24/2024   K 3.3 (L) 09/24/2024   CL 101 09/24/2024   CO2 22 09/24/2024   BUN 14 09/24/2024   CREATININE 1.01 (H) 09/24/2024   GLUCOSE 119 (H) 09/24/2024   GFRNONAA 57 (L) 09/24/2024   GFRAA 81 (L) 12/08/2014   CALCIUM  9.0 09/24/2024   PHOS 2.2 (L) 09/29/2023   PROT 6.3 (L) 09/21/2024   ALBUMIN 3.3 (L) 09/21/2024   BILITOT 2.4 (H) 09/21/2024   ALKPHOS 93 09/21/2024   AST 27 09/21/2024   ALT 16 09/21/2024   ANIONGAP 14 09/24/2024    CBG (last 3)  Recent Labs    09/23/24 1655 09/23/24 2144 09/24/24 0748  GLUCAP 165* 114* 121*     Radiology Studies: ECHOCARDIOGRAM COMPLETE Result Date: 09/23/2024    ECHOCARDIOGRAM REPORT   Patient Name:   Lynn [name] Date of Exam: 09/23/2024 Medical Rec #:  [id number]          Height:       67.0 in Accession #:    [id number]         Weight:       195.0 lb Date of Birth:  1946/08/03          BSA:          2.001 m Patient Age:    78 years           BP:           182/89 mmHg Patient Gender: F                  HR:           96 bpm. Exam Location:  Inpatient Procedure: 2D Echo, Cardiac Doppler, Color Doppler and Intracardiac            Opacification Agent (Both Spectral and Color Flow Doppler were            utilized during procedure). Indications:    Syncope  History:        Patient has no prior history of Echocardiogram examinations.                 Risk Factors:Hypertension, Diabetes and Dyslipidemia. CKD.  Sonographer:    Philomena Daring Referring Phys: TAUNA ELGIE BUTTER  Sonographer Comments: Technically difficult study due to poor echo windows. IMPRESSIONS  1. Left ventricular ejection fraction, by estimation,  is 65 to 70%. The left ventricle has normal function. The left ventricle has no regional wall motion abnormalities. There is mild left ventricular hypertrophy. Left ventricular diastolic parameters are indeterminate.  2. Right ventricular systolic  function is normal. The right ventricular size is normal.  3. The mitral valve is grossly normal. Trivial mitral valve regurgitation.  4. The aortic valve is tricuspid. Aortic valve regurgitation is not visualized.  5. The inferior vena cava is normal in size with greater than 50% respiratory variability, suggesting right atrial pressure of 3 mmHg. FINDINGS  Left Ventricle: Left ventricular ejection fraction, by estimation, is 65 to 70%. The left ventricle has normal function. The left ventricle has no regional wall motion abnormalities. Definity contrast agent was given IV to delineate the left ventricular  endocardial borders. The left ventricular internal cavity size was normal in size. There is mild left ventricular hypertrophy. Left ventricular diastolic parameters are indeterminate. Right Ventricle: The right ventricular size is normal. Right vetricular wall thickness was not assessed. Right ventricular systolic function is normal. Left Atrium: Left atrial size was normal in size. Right Atrium: Right atrial size was normal in size. Pericardium: There is no evidence of pericardial effusion. Mitral Valve: The mitral valve is grossly normal. Trivial mitral valve regurgitation. Tricuspid Valve: The tricuspid valve is normal in structure. Tricuspid valve regurgitation is trivial. Aortic Valve: The aortic valve is tricuspid. Aortic valve regurgitation is not visualized. Pulmonic Valve: The pulmonic valve was not well visualized. Pulmonic valve regurgitation is not visualized. No evidence of pulmonic stenosis. Aorta: The aortic root and ascending aorta are structurally normal, with no evidence of dilitation. Venous: The inferior vena cava is normal in size with greater than 50% respiratory variability, suggesting right atrial pressure of 3 mmHg. IAS/Shunts: No atrial level shunt detected by color flow Doppler.  LEFT VENTRICLE PLAX 2D LVIDd:         3.60 cm   Diastology LVIDs:         2.50 cm   LV e' medial:    7.07  cm/s LV PW:         1.30 cm   LV E/e' medial:  8.4 LV IVS:        1.20 cm   LV e' lateral:   6.31 cm/s LVOT diam:     2.00 cm   LV E/e' lateral: 9.4 LV SV:         68 LV SV Index:   34 LVOT Area:     3.14 cm  RIGHT VENTRICLE             IVC RV S prime:     20.00 cm/s  IVC diam: 1.44 cm TAPSE (M-mode): 2.2 cm LEFT ATRIUM             Index        RIGHT ATRIUM           Index LA diam:        3.04 cm 1.52 cm/m   RA Area:     12.70 cm LA Vol (A2C):   35.4 ml 17.69 ml/m  RA Volume:   27.10 ml  13.55 ml/m LA Vol (A4C):   35.4 ml 17.69 ml/m LA Biplane Vol: 35.3 ml 17.64 ml/m  AORTIC VALVE LVOT Vmax:   125.00 cm/s LVOT Vmean:  82.100 cm/s LVOT VTI:    0.218 m  AORTA Ao Root diam: 2.81 cm Ao Asc diam:  3.32 cm MITRAL VALVE MV Area (PHT): 4.21 cm  SHUNTS MV Decel Time: 180 msec     Systemic VTI:  0.22 m MV E velocity: 59.40 cm/s   Systemic Diam: 2.00 cm MV A velocity: 117.00 cm/s MV E/A ratio:  0.51 Vina Gull MD Electronically signed by Vina Gull MD Signature Date/Time: 09/23/2024/3:56:42 PM    Final    DG Lumbar Spine 2-3 Views Result Date: 09/22/2024 CLINICAL DATA:  Fall EXAM: LUMBAR SPINE - 2-3 VIEW COMPARISON:  None Available. FINDINGS: The bones are osteopenic. No acute fracture or dislocation identified. Alignment is anatomic. There is mild disc space narrowing and endplate osteophyte formation throughout the lumbar spine compatible with degenerative change. There severe atherosclerotic calcifications of the aorta. IMPRESSION: 1. No acute fracture or dislocation. 2. Mild degenerative changes. Electronically Signed   By: Greig Pique M.D.   On: 09/22/2024 18:46       Joette Pebbles M.D. [facility] 09/24/2024, 11:45 AM  Available via Epic secure chat 7am-7pm After 7 pm, please refer to night coverage provider listed on amion.

## 2024-09-24 NOTE — Progress Notes (Signed)
  Rounding Note   Patient Name: Lynn [name] Date of Encounter: 09/24/2024  [facility] Cardiologist: Redell Shallow, MD   Echocardiogram shows normal LV function with no wall motion abnormalities.  I do not think she is a good candidate for aggressive cardiac evaluation and this is not indicated at present regardless.  Cardiology will sign off.  Please call with questions.  She does not require outpatient cardiology follow-up.   Signed, Redell Shallow, MD  09/24/2024, 7:19 AM

## 2024-09-24 NOTE — Plan of Care (Signed)

## 2024-09-25 DIAGNOSIS — W19XXXA Unspecified fall, initial encounter: Secondary | ICD-10-CM | POA: Diagnosis not present

## 2024-09-25 DIAGNOSIS — Y92009 Unspecified place in unspecified non-institutional (private) residence as the place of occurrence of the external cause: Secondary | ICD-10-CM | POA: Diagnosis not present

## 2024-09-25 DIAGNOSIS — N1831 Chronic kidney disease, stage 3a: Secondary | ICD-10-CM | POA: Diagnosis not present

## 2024-09-25 LAB — BASIC METABOLIC PANEL WITH GFR
Anion gap: 14 (ref 5–15)
BUN: 17 mg/dL (ref 8–23)
CO2: 21 mmol/L — ABNORMAL LOW (ref 22–32)
Calcium: 9 mg/dL (ref 8.9–10.3)
Chloride: 99 mmol/L (ref 98–111)
Creatinine, Ser: 1.01 mg/dL — ABNORMAL HIGH (ref 0.44–1.00)
GFR, Estimated: 57 mL/min — ABNORMAL LOW (ref >60)
Glucose, Bld: 127 mg/dL — ABNORMAL HIGH (ref 70–99)
Potassium: 3.8 mmol/L (ref 3.5–5.1)
Sodium: 134 mmol/L — ABNORMAL LOW (ref 135–145)

## 2024-09-25 LAB — GLUCOSE, CAPILLARY
Glucose-Capillary: 133 mg/dL — ABNORMAL HIGH (ref 70–99)
Glucose-Capillary: 128 mg/dL — ABNORMAL HIGH (ref 70–99)
Glucose-Capillary: 111 mg/dL — ABNORMAL HIGH (ref 70–99)
Glucose-Capillary: 103 mg/dL — ABNORMAL HIGH (ref 70–99)

## 2024-09-25 LAB — CBC
HCT: 48.1 % — ABNORMAL HIGH (ref 36.0–46.0)
Hemoglobin: 16.1 g/dL — ABNORMAL HIGH (ref 12.0–15.0)
MCH: 29.7 pg (ref 26.0–34.0)
MCHC: 33.5 g/dL (ref 30.0–36.0)
MCV: 88.7 fL (ref 80.0–100.0)
Platelets: 194 K/uL (ref 150–400)
RBC: 5.42 MIL/uL — ABNORMAL HIGH (ref 3.87–5.11)
RDW: 13.2 % (ref 11.5–15.5)
WBC: 7.1 K/uL (ref 4.0–10.5)
nRBC: 0 % (ref 0.0–0.2)

## 2024-09-25 LAB — MAGNESIUM: Magnesium: 1.7 mg/dL (ref 1.7–2.4)

## 2024-09-25 MED ORDER — LOSARTAN POTASSIUM 25 MG PO TABS: 25.0000 mg | ORAL_TABLET | Freq: Every day | ORAL | Status: DC

## 2024-09-25 MED ORDER — LOSARTAN POTASSIUM 25 MG PO TABS
25.0000 mg | ORAL_TABLET | Freq: Every day | ORAL | Status: DC
  Administered 2024-09-25 – 2024-09-28 (×3): 25 mg via ORAL
  Filled 2024-09-25 (×4): qty 1

## 2024-09-25 NOTE — TOC Progression Note (Signed)
 Transition of Care [facility]) - Progression Note    Patient Details  Name: Lynn [name] MRN: [id number] Date of Birth: 12/12/1945  [facility] CM/SW Contact  Betzaida Cremeens LITTIE Mo[place], [place] Phone Number: 09/25/2024, 4:08 PM  Clinical Narrative:    CSW received insurance auth approval for [facility]. Auth [id number] effective 11/20-11/24. CSW confirmed with [facility] that pt can admit pending bed availability. CSW will continue to follow.   Expected Discharge Plan: Skilled Nursing Facility Barriers to Discharge: Continued Medical Work up, English As A Second Language Teacher, SNF Pending bed offer               Expected Discharge Plan and Services       Living arrangements for the past 2 months: Single Family Home Expected Discharge Date: 09/25/24                                     Social Drivers of Health (SDOH) Interventions SDOH Screenings   Food Insecurity: Patient Unable To Answer (09/22/2024)  Housing: Low Risk  (09/23/2024)  Transportation Needs: Patient Unable To Answer (09/22/2024)  Utilities: Patient Unable To Answer (09/22/2024)  Alcohol Screen: Low Risk  (03/06/2024)  Depression (PHQ2-9): Low Risk  (03/06/2024)  Financial Resource Strain: Low Risk  (03/06/2024)  Physical Activity: Inactive (03/06/2024)  Social Connections: Unknown (09/22/2024)  Stress: Stress Concern Present (03/06/2024)  Tobacco Use: Medium Risk (09/21/2024)  Health Literacy: Adequate Health Literacy (03/06/2024)    Readmission Risk Interventions    09/28/2023    2:44 PM  Readmission Risk Prevention Plan  Post Dischage Appt Complete  Medication Screening Complete  Transportation Screening Complete

## 2024-09-25 NOTE — Plan of Care (Signed)
  Problem: Health Behavior/Discharge Planning: Goal: Ability to identify and utilize available resources and services will improve Outcome: Progressing   Problem: Nutritional: Goal: Maintenance of adequate nutrition will improve Outcome: Progressing   Problem: Skin Integrity: Goal: Risk for impaired skin integrity will decrease Outcome: Progressing

## 2024-09-25 NOTE — Progress Notes (Signed)
 PT Cancellation Note  Patient Details Name: Lynn [name] MRN: [id number] DOB: 1946-01-19   Cancelled Treatment:    Reason Eval/Treat Not Completed: Patient declined, no reason specified Attempted to see pt for PT treatment. Pt asleep and upon wakening stated I'm not getting out of bed. Therapist offered to return later but pt stated don't come back I won't get up. Spoke with daughter and daughter requesting pt transfer to long term memory care unit.    Therisa HERO Zaunegger Therisa Stains PT, DPT 09/25/2024, 11:16 AM

## 2024-09-25 NOTE — Progress Notes (Addendum)
 Pt refused breakfast, lunch, and dinner. POA has even tried to feed her and she is still refusing. Pt is lethargic but arousable. Pt refused for the nurse to do a full assessment, charted what I could assess.

## 2024-09-25 NOTE — Discharge Summary (Signed)
 [facility]  Physician Discharge Summary   Patient ID: Lynn [name] MRN: [id number] DOB/AGE: 1946/05/11 78 y.o.  Admit date: 09/21/2024 Discharge date:   09/25/2024   PCP: Lynn [name]  DISCHARGE DIAGNOSES:    Fall at home, initial encounter Mild rhabdomyolysis Mildly elevated troponin levels   T2DM (type 2 diabetes mellitus) (HCC)   Dyslipidemia associated with T2DM (HCC)   HTN associated with DM (HCC)   Asthma   Chronic kidney disease, stage 3a (HCC)   RECOMMENDATIONS FOR OUTPATIENT FOLLOW UP: Check CBC and basic metabolic panel in 3 to 4 days   Home Health: SNF Equipment/Devices: None  CODE STATUS: DNR  DISCHARGE CONDITION: fair  Diet recommendation: Regular as tolerated  INITIAL HISTORY: 78 y.o. female with medical history significant for T2DM, HTN, HLD, CKD stage III, asthma, depression, cognitive impairment/suspected underlying dementia who is admitted after unwitnessed fall at home with prolonged downtime. She was admitted for mild rhabdomyolysis  HOSPITAL COURSE:   Unwitnessed fall at home Mild rhabdomyolysis CK levels improved with hydration.   Mildly elevated troponins Patient currently denies any chest pain or shortness of breath EKG shows sinus rhythm with mild ST elevation in the lateral leads.  Echocardiogram shows normal LVEF without any wall motion abnormalities. Seen by cardiology.  No plans for further cardiac testing at this time.   Type 2 DM HbA1c 6.6. Monitor CBGs.    Underlying dementia/ cognitive impairment. With intermittent agitation.  Stable for the most part.   Depression Resume celexa .    Hyperlipidemia On statin at home.    Stage 3a ckd Creatinine at baseline.    Mild Leukocytosis.  Resolved   Essential hypertension Patient noted to be on amlodipine.  Blood pressure is better but still elevated.  Resume Cozaar .   Goals of care Seen by palliative care.  Now DNR.   Class I obesity Estimated  body mass index is 31.03 kg/m as calculated from the following:   Height as of this encounter: 5' 6 (1.676 m).   Weight as of this encounter: 87.2 kg.  Patient is stable.  Okay for discharge to SNF.   PERTINENT LABS:  The results of significant diagnostics from this hospitalization (including imaging, microbiology, ancillary and laboratory) are listed below for reference.     Labs:   Basic Metabolic Panel: Recent Labs  Lab 09/21/24 1819 09/21/24 1834 09/22/24 1124 09/24/24 0514 09/25/24 0447  NA 136 134* 136 137 134*  K 3.8 4.0 3.5 3.3* 3.8  CL 100 104 102 101 99  CO2 23  --  22 22 21*  GLUCOSE 175* 171* 151* 119* 127*  BUN 17 22 18 14 17   CREATININE 1.18* 1.00 1.15* 1.01* 1.01*  CALCIUM  9.7  --  8.7* 9.0 9.0  MG  --   --   --  1.8 1.7   Liver Function Tests: Recent Labs  Lab 09/21/24 1819  AST 27  ALT 16  ALKPHOS 93  BILITOT 2.4*  PROT 6.3*  ALBUMIN 3.3*   CBC: Recent Labs  Lab 09/21/24 1819 09/21/24 1834 09/22/24 1124 09/24/24 0514 09/25/24 0447  WBC 12.7*  --  11.0* 6.9 7.1  NEUTROABS 10.6*  --   --  4.6  --   HGB 17.8* 18.0* 16.4* 16.1* 16.1*  HCT 52.8* 53.0* 48.9* 47.7* 48.1*  MCV 89.3  --  90.1 89.0 88.7  PLT 203  --  183 185 194   Cardiac Enzymes: Recent Labs  Lab 09/21/24 1819 09/22/24 1124  CKTOTAL  321* 159    CBG: Recent Labs  Lab 09/24/24 0748 09/24/24 1154 09/24/24 1640 09/24/24 2052 09/25/24 0812  GLUCAP 121* 122* 143* 191* 133*     IMAGING STUDIES ECHOCARDIOGRAM COMPLETE Result Date: 09/23/2024    ECHOCARDIOGRAM REPORT   Patient Name:   Lynn [name] [facility] Date of Exam: 09/23/2024 Medical Rec #:  [id number]          Height:       67.0 in Accession #:    [id number]         Weight:       195.0 lb Date of Birth:  July 09, 1946          BSA:          2.001 m Patient Age:    78 years           BP:           182/89 mmHg Patient Gender: F                  HR:           96 bpm. Exam Location:  Inpatient Procedure: 2D Echo, Cardiac  Doppler, Color Doppler and Intracardiac            Opacification Agent (Both Spectral and Color Flow Doppler were            utilized during procedure). Indications:    Syncope  History:        Patient has no prior history of Echocardiogram examinations.                 Risk Factors:Hypertension, Diabetes and Dyslipidemia. CKD.  Sonographer:    Philomena Daring Referring Phys: TAUNA ELGIE BUTTER  Sonographer Comments: Technically difficult study due to poor echo windows. IMPRESSIONS  1. Left ventricular ejection fraction, by estimation, is 65 to 70%. The left ventricle has normal function. The left ventricle has no regional wall motion abnormalities. There is mild left ventricular hypertrophy. Left ventricular diastolic parameters are indeterminate.  2. Right ventricular systolic function is normal. The right ventricular size is normal.  3. The mitral valve is grossly normal. Trivial mitral valve regurgitation.  4. The aortic valve is tricuspid. Aortic valve regurgitation is not visualized.  5. The inferior vena cava is normal in size with greater than 50% respiratory variability, suggesting right atrial pressure of 3 mmHg. FINDINGS  Left Ventricle: Left ventricular ejection fraction, by estimation, is 65 to 70%. The left ventricle has normal function. The left ventricle has no regional wall motion abnormalities. Definity contrast agent was given IV to delineate the left ventricular  endocardial borders. The left ventricular internal cavity size was normal in size. There is mild left ventricular hypertrophy. Left ventricular diastolic parameters are indeterminate. Right Ventricle: The right ventricular size is normal. Right vetricular wall thickness was not assessed. Right ventricular systolic function is normal. Left Atrium: Left atrial size was normal in size. Right Atrium: Right atrial size was normal in size. Pericardium: There is no evidence of pericardial effusion. Mitral Valve: The mitral valve is grossly normal.  Trivial mitral valve regurgitation. Tricuspid Valve: The tricuspid valve is normal in structure. Tricuspid valve regurgitation is trivial. Aortic Valve: The aortic valve is tricuspid. Aortic valve regurgitation is not visualized. Pulmonic Valve: The pulmonic valve was not well visualized. Pulmonic valve regurgitation is not visualized. No evidence of pulmonic stenosis. Aorta: The aortic root and ascending aorta are structurally normal, with no evidence of dilitation. Venous: The inferior vena cava  is normal in size with greater than 50% respiratory variability, suggesting right atrial pressure of 3 mmHg. IAS/Shunts: No atrial level shunt detected by color flow Doppler.  LEFT VENTRICLE PLAX 2D LVIDd:         3.60 cm   Diastology LVIDs:         2.50 cm   LV e' medial:    7.07 cm/s LV PW:         1.30 cm   LV E/e' medial:  8.4 LV IVS:        1.20 cm   LV e' lateral:   6.31 cm/s LVOT diam:     2.00 cm   LV E/e' lateral: 9.4 LV SV:         68 LV SV Index:   34 LVOT Area:     3.14 cm  RIGHT VENTRICLE             IVC RV S prime:     20.00 cm/s  IVC diam: 1.44 cm TAPSE (M-mode): 2.2 cm LEFT ATRIUM             Index        RIGHT ATRIUM           Index LA diam:        3.04 cm 1.52 cm/m   RA Area:     12.70 cm LA Vol (A2C):   35.4 ml 17.69 ml/m  RA Volume:   27.10 ml  13.55 ml/m LA Vol (A4C):   35.4 ml 17.69 ml/m LA Biplane Vol: 35.3 ml 17.64 ml/m  AORTIC VALVE LVOT Vmax:   125.00 cm/s LVOT Vmean:  82.100 cm/s LVOT VTI:    0.218 m  AORTA Ao Root diam: 2.81 cm Ao Asc diam:  3.32 cm MITRAL VALVE MV Area (PHT): 4.21 cm     SHUNTS MV Decel Time: 180 msec     Systemic VTI:  0.22 m MV E velocity: 59.40 cm/s   Systemic Diam: 2.00 cm MV A velocity: 117.00 cm/s MV E/A ratio:  0.51 Vina Gull MD Electronically signed by Vina Gull MD Signature Date/Time: 09/23/2024/3:56:42 PM    Final    DG Lumbar Spine 2-3 Views Result Date: 09/22/2024 CLINICAL DATA:  Fall EXAM: LUMBAR SPINE - 2-3 VIEW COMPARISON:  None Available. FINDINGS:  The bones are osteopenic. No acute fracture or dislocation identified. Alignment is anatomic. There is mild disc space narrowing and endplate osteophyte formation throughout the lumbar spine compatible with degenerative change. There severe atherosclerotic calcifications of the aorta. IMPRESSION: 1. No acute fracture or dislocation. 2. Mild degenerative changes. Electronically Signed   By: Greig Pique M.D.   On: 09/22/2024 18:46   CT Head Wo Contrast Result Date: 09/21/2024 EXAM: CT HEAD AND CERVICAL SPINE 09/21/2024 08:26:00 PM TECHNIQUE: CT of the head and cervical spine was performed without the administration of intravenous contrast. Multiplanar reformatted images are provided for review. Automated exposure control, iterative reconstruction, and/or weight based adjustment of the mA/kV was utilized to reduce the radiation dose to as low as reasonably achievable. COMPARISON: 09/26/2023. CLINICAL HISTORY: Mental status change, unknown cause. FINDINGS: CT HEAD BRAIN AND VENTRICLES: Periventricular white matter small vessel ischemic changes. Age-related involutional changes. No acute intracranial hemorrhage. No mass effect or midline shift. No abnormal extra-axial fluid collection. No evidence of acute infarct. No hydrocephalus. ORBITS: No acute abnormality. SINUSES AND MASTOIDS: No acute abnormality. SOFT TISSUES AND SKULL: No acute skull fracture. No acute soft tissue abnormality. 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 No acute fracture or traumatic malalignment. DEGENERATIVE CHANGES: Extensive degenerative disc disease with anterior and posterior projecting osteophyte formation and disc space narrowing at each cervical level. SOFT TISSUES: No prevertebral soft tissue swelling. IMPRESSION: 1. No acute intracranial abnormality. 2. Periventricular white matter small vessel ischemic changes and age-related involutional changes. 3. No acute traumatic abnormalities of the cervical  spine. 4. Extensive cervical degenerative disc disease with anterior and posterior osteophyte formation and disc space narrowing at each level, with osteoarthritis at C1C2. Electronically signed by: Fonda Field MD 09/21/2024 08:50 PM EST RP Workstation: [id number]   CT Cervical Spine Wo Contrast Result Date: 09/21/2024 EXAM: CT HEAD AND CERVICAL SPINE 09/21/2024 08:26:00 PM TECHNIQUE: CT of the head and cervical spine was performed without the administration of intravenous contrast. Multiplanar reformatted images are provided for review. Automated exposure control, iterative reconstruction, and/or weight based adjustment of the mA/kV was utilized to reduce the radiation dose to as low as reasonably achievable. COMPARISON: 09/26/2023. CLINICAL HISTORY: Mental status change, unknown cause. FINDINGS: CT HEAD BRAIN AND VENTRICLES: Periventricular white matter small vessel ischemic changes. Age-related involutional changes. No acute intracranial hemorrhage. No mass effect or midline shift. No abnormal extra-axial fluid collection. No evidence of acute infarct. No hydrocephalus. ORBITS: No acute abnormality. SINUSES AND MASTOIDS: No acute abnormality. SOFT TISSUES AND SKULL: No acute skull fracture. No acute soft tissue abnormality. 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 No acute fracture or traumatic malalignment. DEGENERATIVE CHANGES: Extensive degenerative disc disease with anterior and posterior projecting osteophyte formation and disc space narrowing at each cervical level. SOFT TISSUES: No prevertebral soft tissue swelling. IMPRESSION: 1. No acute intracranial abnormality. 2. Periventricular white matter small vessel ischemic changes and age-related involutional changes. 3. No acute traumatic abnormalities of the cervical spine. 4. Extensive cervical degenerative disc disease with anterior and posterior osteophyte formation and disc space narrowing at each level, with  osteoarthritis at C1C2. Electronically signed by: Fonda Field MD 09/21/2024 08:50 PM EST RP Workstation: [id number]   DG Chest Portable 1 View Result Date: 09/21/2024 CLINICAL DATA:  Altered mental status. EXAM: PORTABLE CHEST 1 VIEW COMPARISON:  September 26, 2023 FINDINGS: The heart size and mediastinal contours are within normal limits. There is marked severity calcification of the aortic arch. The lungs are hyperinflated with mild, diffuse, chronic appearing increased interstitial lung markings. A stable 11 mm pulmonary nodule is seen within the mid left lung. No pleural effusion or pneumothorax is identified. The visualized skeletal structures are unremarkable. IMPRESSION: No acute or active cardiopulmonary disease. Electronically Signed   By: Suzen Dials M.D.   On: 09/21/2024 20:14    DISCHARGE EXAMINATION: Vitals:   09/24/24 1801 09/24/24 2050 09/25/24 0613 09/25/24 0901  BP: 133/73 (!) 142/84 (!) 156/91 (!) 169/93  Pulse: 98 85 87 84  Resp: 16  17 16   Temp: 98.2 F (36.8 C) 98.4 F (36.9 C) 98.8 F (37.1 C) 97.7 F (36.5 C)  TempSrc: Oral Oral Oral Oral  SpO2: 96% 96% 95% 96%  Weight:      Height:       General appearance: Awake alert.  In no distress Resp: Clear to auscultation bilaterally.  Normal effort Cardio: S1-S2 is normal regular.  No S3-S4.  No rubs murmurs or bruit GI: Abdomen is soft.  Nontender nondistended.  Bowel sounds are present normal.  No masses organomegaly  DISPOSITION: SNF  Discharge Instructions     Call MD for:  difficulty  breathing, headache or visual disturbances   Complete by: As directed    Call MD for:  extreme fatigue   Complete by: As directed    Call MD for:  persistant dizziness or light-headedness   Complete by: As directed    Call MD for:  persistant nausea and vomiting   Complete by: As directed    Call MD for:  severe uncontrolled pain   Complete by: As directed    Call MD for:  temperature >100.4   Complete by: As  directed    Diet general   Complete by: As directed    Discharge instructions   Complete by: As directed    Please review instructions on the discharge summary.  You were cared for by a hospitalist during your hospital stay. If you have any questions about your discharge medications or the care you received while you were in the hospital after you are discharged, you can call the unit and asked to speak with the hospitalist on call if the hospitalist that took care of you is not available. Once you are discharged, your primary care physician will handle any further medical issues. Please note that NO REFILLS for any discharge medications will be authorized once you are discharged, as it is imperative that you return to your primary care physician (or establish a relationship with a primary care physician if you do not have one) for your aftercare needs so that they can reassess your need for medications and monitor your lab values. If you do not have a primary care physician, you can call [phone] for a physician referral.   Increase activity slowly   Complete by: As directed          Allergies as of 09/25/2024       Reactions   Diphenhydramine Other (See Comments)   Makes her hyperactive    Morphine And Codeine Itching   Patient states that she can still take this medication it just makes her itch.        Medication List     STOP taking these medications    metFORMIN  500 MG tablet Commonly known as: GLUCOPHAGE    montelukast  10 MG tablet Commonly known as: SINGULAIR    zolpidem  5 MG tablet Commonly known as: AMBIEN        TAKE these medications    acetaminophen  325 MG tablet Commonly known as: TYLENOL  Take 650 mg by mouth every 6 (six) hours as needed for moderate pain.   albuterol  108 (90 Base) MCG/ACT inhaler Commonly known as: Proventil  HFA Inhale 2 puffs into the lungs every 6 (six) hours as needed for wheezing or shortness of breath.   amLODipine 5 MG  tablet Commonly known as: NORVASC Take 1 tablet (5 mg total) by mouth daily.   atorvastatin  40 MG tablet Commonly known as: LIPITOR Take 1 tablet (40 mg total) by mouth daily.   citalopram  20 MG tablet Commonly known as: CELEXA  Take 1 tablet (20 mg) by mouth daily.   docusate sodium 100 MG capsule Commonly known as: COLACE Take 100 mg by mouth daily as needed for mild constipation or moderate constipation. Reported on 10/21/2015   losartan  25 MG tablet Commonly known as: COZAAR  Take 1 tablet (25 mg total) by mouth daily.   Qvar  RediHaler 40 MCG/ACT inhaler Generic drug: beclomethasone Inhale 1 puff into the lungs 2 (two) times daily.          Contact information for follow-up providers     Lynn Lauraine BRAVO,  [name]. Schedule an appointment as soon as possible for a visit in 1 week(s).   Specialty: Nurse Practitioner Why: post hospitalization follow up Contact information: [address] [place] [address] [phone]              Contact information for after-discharge care     Destination     [facility] .   Service: Skilled Nursing Contact information: [address] [place]  [address] [phone]                     TOTAL DISCHARGE TIME: 35 minutes  Sherry Blackard Verdene  [facility] Pager on [email or web address]  09/25/2024, 9:57 AM

## 2024-09-25 NOTE — Plan of Care (Signed)
  Problem: Nutritional: Goal: Maintenance of adequate nutrition will improve Outcome: Not Progressing   Problem: Clinical Measurements: Goal: Will remain free from infection Outcome: Progressing Goal: Cardiovascular complication will be avoided Outcome: Progressing   Problem: Coping: Goal: Level of anxiety will decrease Outcome: Not Progressing   Problem: Elimination: Goal: Will not experience complications related to urinary retention Outcome: Progressing

## 2024-09-26 LAB — GLUCOSE, CAPILLARY
Glucose-Capillary: 98 mg/dL (ref 70–99)
Glucose-Capillary: 128 mg/dL — ABNORMAL HIGH (ref 70–99)
Glucose-Capillary: 206 mg/dL — ABNORMAL HIGH (ref 70–99)
Glucose-Capillary: 219 mg/dL — ABNORMAL HIGH (ref 70–99)

## 2024-09-26 MED ORDER — QUETIAPINE 12.5 MG HALF TABLET
25.0000 mg | ORAL_TABLET | Freq: Every day | ORAL | Status: DC
  Filled 2024-09-26: qty 2

## 2024-09-26 NOTE — TOC Progression Note (Signed)
 Transition of Care [facility]) - Progression Note    Patient Details  Name: Lynn [name] MRN: [id number] Date of Birth: July 12, 1946  [facility] CM/SW Contact  Tawyna Pellot LITTIE [place], [place] Phone Number: 09/26/2024, 2:57 PM  Clinical Narrative:    Pt refused to go to SNF when [facility] arrived to transport her to [facility]. CSW informed Jon. Jon stated they will continue to follow wen she is ready to DC to [facility]. CSW will continue to follow.   Expected Discharge Plan: Skilled Nursing Facility Barriers to Discharge: Barriers Resolved               Expected Discharge Plan and Services       Living arrangements for the past 2 months: Single Family Home Expected Discharge Date: 09/26/24                                     Social Drivers of Health (SDOH) Interventions SDOH Screenings   Food Insecurity: Patient Unable To Answer (09/22/2024)  Housing: Low Risk  (09/23/2024)  Transportation Needs: Patient Unable To Answer (09/22/2024)  Utilities: Patient Unable To Answer (09/22/2024)  Alcohol Screen: Low Risk  (03/06/2024)  Depression (PHQ2-9): Low Risk  (03/06/2024)  Financial Resource Strain: Low Risk  (03/06/2024)  Physical Activity: Inactive (03/06/2024)  Social Connections: Unknown (09/22/2024)  Stress: Stress Concern Present (03/06/2024)  Tobacco Use: Medium Risk (09/21/2024)  Health Literacy: Adequate Health Literacy (03/06/2024)    Readmission Risk Interventions    09/28/2023    2:44 PM  Readmission Risk Prevention Plan  Post Dischage Appt Complete  Medication Screening Complete  Transportation Screening Complete

## 2024-09-26 NOTE — TOC Transition Note (Signed)
 Transition of Care [facility]) - Discharge Note   Patient Details  Name: Lynn [name] MRN: [id number] Date of Birth: Feb 27, 1946  [facility]) CM/SW Contact:  Jeoffrey LITTIE Maranda [place] Phone Number: 09/26/2024, 12:26 PM   Clinical Narrative:    Patient will DC to: [facility] Anticipated DC date: 09/26/24 Family notified: Yes Transport by: [facility]   Per MD patient ready for DC to [facility]. RN to call report prior to discharge [phone] room 105. RN, patient, patient's family, and facility notified of DC. Discharge Summary and FL2 sent to facility. DC packet on chart. Ambulance transport requested for patient.   CSW will sign off for now as social work intervention is no longer needed. Please consult us  again if new needs arise.     Final next level of care: Skilled Nursing Facility Barriers to Discharge: Barriers Resolved   Patient Goals and CMS Choice Patient states their goals for this hospitalization and ongoing recovery are:: SNF          Discharge Placement   Existing PASRR number confirmed : 09/26/24          Patient chooses bed at: [facility] Patient to be transferred to facility by: [facility] Name of family member notified: Zebedee Patient and family notified of of transfer: 09/26/24  Discharge Plan and Services Additional resources added to the After Visit Summary for                                       Social Drivers of Health (SDOH) Interventions SDOH Screenings   Food Insecurity: Patient Unable To Answer (09/22/2024)  Housing: Low Risk  (09/23/2024)  Transportation Needs: Patient Unable To Answer (09/22/2024)  Utilities: Patient Unable To Answer (09/22/2024)  Alcohol Screen: Low Risk  (03/06/2024)  Depression (PHQ2-9): Low Risk  (03/06/2024)  Financial Resource Strain: Low Risk  (03/06/2024)  Physical Activity: Inactive (03/06/2024)  Social Connections: Unknown (09/22/2024)  Stress: Stress Concern  Present (03/06/2024)  Tobacco Use: Medium Risk (09/21/2024)  Health Literacy: Adequate Health Literacy (03/06/2024)     Readmission Risk Interventions    09/28/2023    2:44 PM  Readmission Risk Prevention Plan  Post Dischage Appt Complete  Medication Screening Complete  Transportation Screening Complete

## 2024-09-26 NOTE — Discharge Summary (Signed)
 [facility]  Physician Discharge Summary   Patient ID: Lynn [name] MRN: [id number] DOB/AGE: June 03, 1946 78 y.o.  Admit date: 09/21/2024 Discharge date:   09/26/2024   PCP: Elnor Lauraine BRAVO, NP  DISCHARGE DIAGNOSES:    Fall at home, initial encounter Mild rhabdomyolysis Mildly elevated troponin levels   T2DM (type 2 diabetes mellitus) (HCC)   Dyslipidemia associated with T2DM (HCC)   HTN associated with DM (HCC)   Asthma   Chronic kidney disease, stage 3a (HCC)   RECOMMENDATIONS FOR OUTPATIENT FOLLOW UP: Check CBC and basic metabolic panel in 3 to 4 days Encourage oral intake Palliative care to follow at SNF   Home Health: SNF Equipment/Devices: None  CODE STATUS: DNR  DISCHARGE CONDITION: fair  Diet recommendation: Regular as tolerated  INITIAL HISTORY: 78 y.o. female with medical history significant for T2DM, HTN, HLD, CKD stage III, asthma, depression, cognitive impairment/suspected underlying dementia who is admitted after unwitnessed fall at home with prolonged downtime. She was admitted for mild rhabdomyolysis  HOSPITAL COURSE:   Unwitnessed fall at home Mild rhabdomyolysis CK levels improved with hydration.   Mildly elevated troponins Patient currently denies any chest pain or shortness of breath EKG shows sinus rhythm with mild ST elevation in the lateral leads.  Echocardiogram shows normal LVEF without any wall motion abnormalities. Seen by cardiology.  No plans for further cardiac testing at this time.   Type 2 DM HbA1c 6.6. Monitor CBGs.    Underlying dementia/ cognitive impairment. With intermittent agitation.  Stable for the most part. Oral intake remains poor.  She needs constant encouragement.   Depression Resume celexa .    Hyperlipidemia On statin at home.    Stage 3a ckd Creatinine at baseline.    Mild Leukocytosis.  Resolved   Essential hypertension She had been noncompliant with her medications prior to  admission.  Continue with amlodipine  and Cozaar .  Monitor blood pressures.  Will not be too aggressive in treating elevated blood pressure readings.     Goals of care Seen by palliative care.  Now DNR.   Class I obesity Estimated body mass index is 31.03 kg/m as calculated from the following:   Height as of this encounter: 5' 6 (1.676 m).   Weight as of this encounter: 87.2 kg.  Patient is stable.  Okay for discharge to SNF.   PERTINENT LABS:  The results of significant diagnostics from this hospitalization (including imaging, microbiology, ancillary and laboratory) are listed below for reference.     Labs:   Basic Metabolic Panel: Recent Labs  Lab 09/21/24 1819 09/21/24 1834 09/22/24 1124 09/24/24 0514 09/25/24 0447  NA 136 134* 136 137 134*  K 3.8 4.0 3.5 3.3* 3.8  CL 100 104 102 101 99  CO2 23  --  22 22 21*  GLUCOSE 175* 171* 151* 119* 127*  BUN 17 22 18 14 17   CREATININE 1.18* 1.00 1.15* 1.01* 1.01*  CALCIUM  9.7  --  8.7* 9.0 9.0  MG  --   --   --  1.8 1.7   Liver Function Tests: Recent Labs  Lab 09/21/24 1819  AST 27  ALT 16  ALKPHOS 93  BILITOT 2.4*  PROT 6.3*  ALBUMIN 3.3*   CBC: Recent Labs  Lab 09/21/24 1819 09/21/24 1834 09/22/24 1124 09/24/24 0514 09/25/24 0447  WBC 12.7*  --  11.0* 6.9 7.1  NEUTROABS 10.6*  --   --  4.6  --   HGB 17.8* 18.0* 16.4* 16.1* 16.1*  HCT 52.8* 53.0*  48.9* 47.7* 48.1*  MCV 89.3  --  90.1 89.0 88.7  PLT 203  --  183 185 194   Cardiac Enzymes: Recent Labs  Lab 09/21/24 1819 09/22/24 1124  CKTOTAL 321* 159    CBG: Recent Labs  Lab 09/25/24 0812 09/25/24 1153 09/25/24 1750 09/25/24 2044 09/26/24 0809  GLUCAP 133* 128* 111* 103* 98     IMAGING STUDIES ECHOCARDIOGRAM COMPLETE Result Date: 09/23/2024    ECHOCARDIOGRAM REPORT   Patient Name:   Lynn [name] [facility] Date of Exam: 09/23/2024 Medical Rec #:  [id number]          Height:       67.0 in Accession #:    [id number]         Weight:       195.0  lb Date of Birth:  [birth year]          BSA:          2.001 m Patient Age:    78 years           BP:           182/89 mmHg Patient Gender: F                  HR:           96 bpm. Exam Location:  Inpatient Procedure: 2D Echo, Cardiac Doppler, Color Doppler and Intracardiac            Opacification Agent (Both Spectral and Color Flow Doppler were            utilized during procedure). Indications:    Syncope  History:        Patient has no prior history of Echocardiogram examinations.                 Risk Factors:Hypertension, Diabetes and Dyslipidemia. CKD.  Sonographer:    Philomena Daring Referring Phys: TAUNA ELGIE BUTTER  Sonographer Comments: Technically difficult study due to poor echo windows. IMPRESSIONS  1. Left ventricular ejection fraction, by estimation, is 65 to 70%. The left ventricle has normal function. The left ventricle has no regional wall motion abnormalities. There is mild left ventricular hypertrophy. Left ventricular diastolic parameters are indeterminate.  2. Right ventricular systolic function is normal. The right ventricular size is normal.  3. The mitral valve is grossly normal. Trivial mitral valve regurgitation.  4. The aortic valve is tricuspid. Aortic valve regurgitation is not visualized.  5. The inferior vena cava is normal in size with greater than 50% respiratory variability, suggesting right atrial pressure of 3 mmHg. FINDINGS  Left Ventricle: Left ventricular ejection fraction, by estimation, is 65 to 70%. The left ventricle has normal function. The left ventricle has no regional wall motion abnormalities. Definity  contrast agent was given IV to delineate the left ventricular  endocardial borders. The left ventricular internal cavity size was normal in size. There is mild left ventricular hypertrophy. Left ventricular diastolic parameters are indeterminate. Right Ventricle: The right ventricular size is normal. Right vetricular wall thickness was not assessed. Right ventricular  systolic function is normal. Left Atrium: Left atrial size was normal in size. Right Atrium: Right atrial size was normal in size. Pericardium: There is no evidence of pericardial effusion. Mitral Valve: The mitral valve is grossly normal. Trivial mitral valve regurgitation. Tricuspid Valve: The tricuspid valve is normal in structure. Tricuspid valve regurgitation is trivial. Aortic Valve: The aortic valve is tricuspid. Aortic valve regurgitation is not visualized. Pulmonic Valve: The pulmonic valve  was not well visualized. Pulmonic valve regurgitation is not visualized. No evidence of pulmonic stenosis. Aorta: The aortic root and ascending aorta are structurally normal, with no evidence of dilitation. Venous: The inferior vena cava is normal in size with greater than 50% respiratory variability, suggesting right atrial pressure of 3 mmHg. IAS/Shunts: No atrial level shunt detected by color flow Doppler.  LEFT VENTRICLE PLAX 2D LVIDd:         3.60 cm   Diastology LVIDs:         2.50 cm   LV e' medial:    7.07 cm/s LV PW:         1.30 cm   LV E/e' medial:  8.4 LV IVS:        1.20 cm   LV e' lateral:   6.31 cm/s LVOT diam:     2.00 cm   LV E/e' lateral: 9.4 LV SV:         68 LV SV Index:   34 LVOT Area:     3.14 cm  RIGHT VENTRICLE             IVC RV S prime:     20.00 cm/s  IVC diam: 1.44 cm TAPSE (M-mode): 2.2 cm LEFT ATRIUM             Index        RIGHT ATRIUM           Index LA diam:        3.04 cm 1.52 cm/m   RA Area:     12.70 cm LA Vol (A2C):   35.4 ml 17.69 ml/m  RA Volume:   27.10 ml  13.55 ml/m LA Vol (A4C):   35.4 ml 17.69 ml/m LA Biplane Vol: 35.3 ml 17.64 ml/m  AORTIC VALVE LVOT Vmax:   125.00 cm/s LVOT Vmean:  82.100 cm/s LVOT VTI:    0.218 m  AORTA Ao Root diam: 2.81 cm Ao Asc diam:  3.32 cm MITRAL VALVE MV Area (PHT): 4.21 cm     SHUNTS MV Decel Time: 180 msec     Systemic VTI:  0.22 m MV E velocity: 59.40 cm/s   Systemic Diam: 2.00 cm MV A velocity: 117.00 cm/s MV E/A ratio:  0.51 Vina Gull  MD Electronically signed by Vina Gull MD Signature Date/Time: 09/23/2024/3:56:42 PM    Final    DG Lumbar Spine 2-3 Views Result Date: 09/22/2024 CLINICAL DATA:  Fall EXAM: LUMBAR SPINE - 2-3 VIEW COMPARISON:  None Available. FINDINGS: The bones are osteopenic. No acute fracture or dislocation identified. Alignment is anatomic. There is mild disc space narrowing and endplate osteophyte formation throughout the lumbar spine compatible with degenerative change. There severe atherosclerotic calcifications of the aorta. IMPRESSION: 1. No acute fracture or dislocation. 2. Mild degenerative changes. Electronically Signed   By: Greig Pique M.D.   On: 09/22/2024 18:46   CT Head Wo Contrast Result Date: 09/21/2024 EXAM: CT HEAD AND CERVICAL SPINE 09/21/2024 08:26:00 PM TECHNIQUE: CT of the head and cervical spine was performed without the administration of intravenous contrast. Multiplanar reformatted images are provided for review. Automated exposure control, iterative reconstruction, and/or weight based adjustment of the mA/kV was utilized to reduce the radiation dose to as low as reasonably achievable. COMPARISON: 09/26/2023. CLINICAL HISTORY: Mental status change, unknown cause. FINDINGS: CT HEAD BRAIN AND VENTRICLES: Periventricular white matter small vessel ischemic changes. Age-related involutional changes. No acute intracranial hemorrhage. No mass effect or midline shift. No abnormal extra-axial fluid  collection. No evidence of acute infarct. No hydrocephalus. ORBITS: No acute abnormality. SINUSES AND MASTOIDS: No acute abnormality. SOFT TISSUES AND SKULL: No acute skull fracture. No acute soft tissue abnormality. 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 No acute fracture or traumatic malalignment. DEGENERATIVE CHANGES: Extensive degenerative disc disease with anterior and posterior projecting osteophyte formation and disc space narrowing at each cervical level. SOFT TISSUES: No  prevertebral soft tissue swelling. IMPRESSION: 1. No acute intracranial abnormality. 2. Periventricular white matter small vessel ischemic changes and age-related involutional changes. 3. No acute traumatic abnormalities of the cervical spine. 4. Extensive cervical degenerative disc disease with anterior and posterior osteophyte formation and disc space narrowing at each level, with osteoarthritis at C1C2. Electronically signed by: Fonda Field MD 09/21/2024 08:50 PM EST RP Workstation: [id number]   CT Cervical Spine Wo Contrast Result Date: 09/21/2024 EXAM: CT HEAD AND CERVICAL SPINE 09/21/2024 08:26:00 PM TECHNIQUE: CT of the head and cervical spine was performed without the administration of intravenous contrast. Multiplanar reformatted images are provided for review. Automated exposure control, iterative reconstruction, and/or weight based adjustment of the mA/kV was utilized to reduce the radiation dose to as low as reasonably achievable. COMPARISON: 09/26/2023. CLINICAL HISTORY: Mental status change, unknown cause. FINDINGS: CT HEAD BRAIN AND VENTRICLES: Periventricular white matter small vessel ischemic changes. Age-related involutional changes. No acute intracranial hemorrhage. No mass effect or midline shift. No abnormal extra-axial fluid collection. No evidence of acute infarct. No hydrocephalus. ORBITS: No acute abnormality. SINUSES AND MASTOIDS: No acute abnormality. SOFT TISSUES AND SKULL: No acute skull fracture. No acute soft tissue abnormality. 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 No acute fracture or traumatic malalignment. DEGENERATIVE CHANGES: Extensive degenerative disc disease with anterior and posterior projecting osteophyte formation and disc space narrowing at each cervical level. SOFT TISSUES: No prevertebral soft tissue swelling. IMPRESSION: 1. No acute intracranial abnormality. 2. Periventricular white matter small vessel ischemic changes and  age-related involutional changes. 3. No acute traumatic abnormalities of the cervical spine. 4. Extensive cervical degenerative disc disease with anterior and posterior osteophyte formation and disc space narrowing at each level, with osteoarthritis at C1C2. Electronically signed by: Fonda Field MD 09/21/2024 08:50 PM EST RP Workstation: [id number]   DG Chest Portable 1 View Result Date: 09/21/2024 CLINICAL DATA:  Altered mental status. EXAM: PORTABLE CHEST 1 VIEW COMPARISON:  September 26, 2023 FINDINGS: The heart size and mediastinal contours are within normal limits. There is marked severity calcification of the aortic arch. The lungs are hyperinflated with mild, diffuse, chronic appearing increased interstitial lung markings. A stable 11 mm pulmonary nodule is seen within the mid left lung. No pleural effusion or pneumothorax is identified. The visualized skeletal structures are unremarkable. IMPRESSION: No acute or active cardiopulmonary disease. Electronically Signed   By: Suzen Dials M.D.   On: 09/21/2024 20:14    DISCHARGE EXAMINATION: Vitals:   09/25/24 0613 09/25/24 0901 09/25/24 2322 09/26/24 0437  BP: (!) 156/91 (!) 169/93 (!) 153/83 (!) 156/108  Pulse: 87 84 85 97  Resp: 17 16 17 18   Temp: 98.8 F (37.1 C) 97.7 F (36.5 C) 98 F (36.7 C) (!) 97.5 F (36.4 C)  TempSrc: Oral Oral Axillary Axillary  SpO2: 95% 96% 96% 96%  Weight:      Height:       General appearance: Awake alert.  In no distress Resp: Clear to auscultation bilaterally.  Normal effort Cardio: S1-S2 is normal regular.  No S3-S4.  No rubs murmurs or bruit GI: Abdomen is soft.  Nontender nondistended.  Bowel sounds are present normal.  No masses organomegaly   DISPOSITION: SNF  Discharge Instructions     Call MD for:  difficulty breathing, headache or visual disturbances   Complete by: As directed    Call MD for:  extreme fatigue   Complete by: As directed    Call MD for:  persistant dizziness or  light-headedness   Complete by: As directed    Call MD for:  persistant nausea and vomiting   Complete by: As directed    Call MD for:  severe uncontrolled pain   Complete by: As directed    Call MD for:  temperature >100.4   Complete by: As directed    Diet general   Complete by: As directed    Discharge instructions   Complete by: As directed    Please review instructions on the discharge summary.  You were cared for by a hospitalist during your hospital stay. If you have any questions about your discharge medications or the care you received while you were in the hospital after you are discharged, you can call the unit and asked to speak with the hospitalist on call if the hospitalist that took care of you is not available. Once you are discharged, your primary care physician will handle any further medical issues. Please note that NO REFILLS for any discharge medications will be authorized once you are discharged, as it is imperative that you return to your primary care physician (or establish a relationship with a primary care physician if you do not have one) for your aftercare needs so that they can reassess your need for medications and monitor your lab values. If you do not have a primary care physician, you can call [phone] for a physician referral.   Increase activity slowly   Complete by: As directed          Allergies as of 09/26/2024       Reactions   Diphenhydramine Other (See Comments)   Makes her hyperactive    Morphine And Codeine Itching   Patient states that she can still take this medication it just makes her itch.        Medication List     STOP taking these medications    metFORMIN  500 MG tablet Commonly known as: GLUCOPHAGE    montelukast  10 MG tablet Commonly known as: SINGULAIR    zolpidem  5 MG tablet Commonly known as: AMBIEN        TAKE these medications    acetaminophen  325 MG tablet Commonly known as: TYLENOL  Take 650 mg by mouth  every 6 (six) hours as needed for moderate pain.   albuterol  108 (90 Base) MCG/ACT inhaler Commonly known as: Proventil  HFA Inhale 2 puffs into the lungs every 6 (six) hours as needed for wheezing or shortness of breath.   amLODipine  5 MG tablet Commonly known as: NORVASC  Take 1 tablet (5 mg total) by mouth daily.   atorvastatin  40 MG tablet Commonly known as: LIPITOR Take 1 tablet (40 mg total) by mouth daily.   citalopram  20 MG tablet Commonly known as: CELEXA  Take 1 tablet (20 mg) by mouth daily.   docusate sodium  100 MG capsule Commonly known as: COLACE Take 100 mg by mouth daily as needed for mild constipation or moderate constipation. Reported on 10/21/2015   losartan  25 MG tablet Commonly known as: COZAAR  Take 1 tablet (25 mg total) by mouth daily.   Qvar   RediHaler 40 MCG/ACT inhaler Generic drug: beclomethasone Inhale 1 puff into the lungs 2 (two) times daily.          Contact information for follow-up providers     Elnor Lauraine BRAVO, NP. Schedule an appointment as soon as possible for a visit in 1 week(s).   Specialty: Nurse Practitioner Why: post hospitalization follow up Contact information: [address] [place] [address] [phone]              Contact information for after-discharge care     Destination     [facility] .   Service: Skilled Nursing Contact information: [address] [place]  [phone]                     TOTAL DISCHARGE TIME: 35 minutes  Zyan Coby Verdene  [facility] Pager on [email or web address]  09/26/2024, 9:22 AM

## 2024-09-26 NOTE — TOC Progression Note (Signed)
 Transition of Care [facility]) - Progression Note    Patient Details  Name: Lynn [name] MRN: [id number] Date of Birth: [birth year]  [facility] CM/SW Contact  Ruberta Holck LITTIE [place], [place] Phone Number: 09/26/2024, 11:47 AM  Clinical Narrative:    CSW awaiting bed confirmation at [facility] before calling [facility]. CSW will continue to follow.   Expected Discharge Plan: Skilled Nursing Facility Barriers to Discharge: Continued Medical Work up, English As A Second Language Teacher, SNF Pending bed offer               Expected Discharge Plan and Services       Living arrangements for the past 2 months: Single Family Home Expected Discharge Date: 09/25/24                                     Social Drivers of Health (SDOH) Interventions SDOH Screenings   Food Insecurity: Patient Unable To Answer (09/22/2024)  Housing: Low Risk  (09/23/2024)  Transportation Needs: Patient Unable To Answer (09/22/2024)  Utilities: Patient Unable To Answer (09/22/2024)  Alcohol Screen: Low Risk  (03/06/2024)  Depression (PHQ2-9): Low Risk  (03/06/2024)  Financial Resource Strain: Low Risk  (03/06/2024)  Physical Activity: Inactive (03/06/2024)  Social Connections: Unknown (09/22/2024)  Stress: Stress Concern Present (03/06/2024)  Tobacco Use: Medium Risk (09/21/2024)  Health Literacy: Adequate Health Literacy (03/06/2024)    Readmission Risk Interventions    09/28/2023    2:44 PM  Readmission Risk Prevention Plan  Post Dischage Appt Complete  Medication Screening Complete  Transportation Screening Complete

## 2024-09-26 NOTE — Plan of Care (Addendum)
 Pt refused personal care/turns and gets verbally aggressive to nursing staff whenever we ask her permission to turn and  check if she's clean saying, I will smack you if you touch me, leave me alone. Explained to pt that she could get pressure sores and rash if she's left alone and pt says, I don't care, leave me alone. Plan of care continues. Problem: Metabolic: Goal: Ability to maintain appropriate glucose levels will improve Outcome: Progressing   Problem: Nutritional: Goal: Maintenance of adequate nutrition will improve Outcome: Progressing   Problem: Clinical Measurements: Goal: Ability to maintain clinical measurements within normal limits will improve Outcome: Progressing   Problem: Nutrition: Goal: Adequate nutrition will be maintained Outcome: Progressing   Problem: Pain Managment: Goal: General experience of comfort will improve and/or be controlled Outcome: Progressing   Problem: Safety: Goal: Ability to remain free from injury will improve Outcome: Progressing

## 2024-09-26 NOTE — Progress Notes (Addendum)
 Pt has DC order. Printed AVS and kept in the DC pocket. Called facility 2x with no answer from the nurse. POA at bedside and aware of the place. Awaiting for EMS to provide transport.  1440: EMS came to pick up pt but pt refused, POA at bedside and encourage pt to go but with no success. EMS refused to transport pt due to pt's refusal. MD and CM were notified and discontinue the pt's discharge.MD made changes on pt's medication regimen.   1500: This RN came back to talk to pt and pt does not remember that EMS came to pick her up. Pt is alert and oriented x3.

## 2024-09-27 DIAGNOSIS — N1831 Chronic kidney disease, stage 3a: Secondary | ICD-10-CM | POA: Diagnosis not present

## 2024-09-27 DIAGNOSIS — F03911 Unspecified dementia, unspecified severity, with agitation: Secondary | ICD-10-CM | POA: Diagnosis not present

## 2024-09-27 LAB — GLUCOSE, CAPILLARY
Glucose-Capillary: 138 mg/dL — ABNORMAL HIGH (ref 70–99)
Glucose-Capillary: 131 mg/dL — ABNORMAL HIGH (ref 70–99)
Glucose-Capillary: 204 mg/dL — ABNORMAL HIGH (ref 70–99)
Glucose-Capillary: 121 mg/dL — ABNORMAL HIGH (ref 70–99)

## 2024-09-27 MED ORDER — OLANZAPINE 10 MG IM SOLR
2.5000 mg | Freq: Two times a day (BID) | INTRAMUSCULAR | Status: DC
  Administered 2024-09-27 (×2): 2.5 mg via INTRAMUSCULAR
  Filled 2024-09-27 (×4): qty 10

## 2024-09-27 MED ORDER — HALOPERIDOL LACTATE 5 MG/ML IJ SOLN
2.0000 mg | Freq: Once | INTRAMUSCULAR | Status: AC
  Administered 2024-09-27: 2 mg via INTRAMUSCULAR
  Filled 2024-09-27 (×2): qty 1

## 2024-09-27 MED ORDER — VITAMIN B-12 1000 MCG PO TABS
1000.0000 ug | ORAL_TABLET | Freq: Every day | ORAL | Status: DC
  Administered 2024-09-28 – 2024-10-06 (×8): 1000 ug via ORAL
  Filled 2024-09-27 (×10): qty 1

## 2024-09-27 MED ORDER — HALOPERIDOL LACTATE 5 MG/ML IJ SOLN: 2.0000 mg | Freq: Once | INTRAMUSCULAR | Status: DC

## 2024-09-27 MED ORDER — QUETIAPINE 12.5 MG HALF TABLET
25.0000 mg | ORAL_TABLET | Freq: Two times a day (BID) | ORAL | Status: DC
  Filled 2024-09-27: qty 2

## 2024-09-27 MED ORDER — OLANZAPINE 5 MG PO TABS
2.5000 mg | ORAL_TABLET | Freq: Two times a day (BID) | ORAL | Status: DC
  Administered 2024-09-28: 2.5 mg via ORAL
  Filled 2024-09-27 (×2): qty 1

## 2024-09-27 NOTE — Consult Note (Signed)
 [facility] Psychiatric Consult Initial  Patient Name: .Lynn [name]  MRN: [id number]  DOB: 07/23/1946  Consult Order details:  Orders (From admission, onward)     Start     Ordered   09/27/24 1202  IP CONSULT TO PSYCHIATRY       Ordering Provider: Verdene Purchase, MD  Provider:  (Not yet assigned)  Question Answer Comment  Location [facility]   Reason for Consult? patient with dementia, behaving irrationally, refusing SNF but also doesn't want to leave the hospital. refusing meds.no active medical issue. Please assist with management.      09/27/24 1202             Mode of Visit: In person    Psychiatry Consult Evaluation  Service Date: September 27, 2024 LOS:  LOS: 0 days  Chief Complaint Everybody says I'm crazy.  Primary Psychiatric Diagnoses  Agitation related to dementia/cognitive decline  Assessment  Lynn [name] is a 78 y.o. female admitted: Medicallyfor 09/21/2024  6:10 PM for a fall.  Dr. Tobie wrote, with medical history significant for T2DM, HTN, HLD, CKD stage III, asthma, depression, cognitive impairment/suspected underlying dementia who presented to the ED from home for evaluation after she was found down on her floor.   Patient lives alone.  She has had progressive decline in ability to care for self and memory.  Family states that she is post to ambulate with a walker but she never uses it.  She has not been taking any of her medications other than Ambien .  She often refuses help from family.  She has not been bathing herself.  She has not been able to take care of her finances.  Her cousins have been having to take care of all of these things.  She has not left her house since her last PCP visit.    Family do call her twice a day and check in on her to make sure she is doing okay.  They last spoke to her last night around 6:30 PM.  She did not answer the the phone today therefore her cousin went to her house to check in on  her.  They found her lying on the hardwood floor undressed.  They tried to get her up.  She was refusing to get up and also thought that she was laying in her own bed.  Family stated that she has confusion at baseline but this appeared worse today.   EMS were called.  Patient was refusing to get up with EMS/fire.  I spent an hour and a half trying to assist her.  Per ED triage documentation patient was combative with EMS and she was given 5 mg of Haldol  and she calmed down afterwards.  Her current presentation of confusion, refusing medications, memory issues, aggressive at times is most consistent with agitation related to dementia (per Dr. Darlean notes). Current outpatient psychotropic medications include Celexa  and Seroquel  and historically she has had minimal response to these medications as she is non compliant with medications prior to admission as evidenced by self and family report. On initial examination, patient was sleeping and irritable. Please see plan below for detailed recommendations.   Diagnoses:  Active Hospital problems: Principal Problem:   Fall at home, initial encounter Active Problems:   T2DM (type 2 diabetes mellitus) (HCC)   Dyslipidemia associated with T2DM (HCC)   HTN associated with DM (HCC)   Asthma   Chronic kidney disease, stage 3a (HCC)   Agitation  due to dementia [facility])    Plan   ## Psychiatric Medication Recommendations:  -Discontinued Seroquel  25 mg BID for now, started Zyprexa  2.5 mg BID oral or IM until mood stabilized and takes oral medications.  Then, she can return to Seroquel  and not the Zyprexa . -Continue Celexa  20 mg daily  ## Medical Decision Making Capacity: Not specifically addressed in this encounter  ## Further Work-up:  -- most recent EKG on 11/16 had QtC of 446 and 458, 11/17 was 501, repeat order placed -- Pertinent labwork reviewed earlier this admission includes: CBC with diff, EKG, chem panel, and u/a   ## Disposition:-- There  are no psychiatric contraindications to discharge at this time  ## Behavioral / Environmental: - No specific recommendations at this time.     ## Safety and Observation Level:  - Based on my clinical evaluation, I estimate the patient to be at minimal risk of self harm in the current setting. - At this time, we recommend  routine. This decision is based on my review of the chart including patient's history and current presentation, interview of the patient, mental status examination, and consideration of suicide risk including evaluating suicidal ideation, plan, intent, suicidal or self-harm behaviors, risk factors, and protective factors. This judgment is based on our ability to directly address suicide risk, implement suicide prevention strategies, and develop a safety plan while the patient is in the clinical setting. Please contact our team if there is a concern that risk level has changed.  CSSR Risk Category:C-SSRS RISK CATEGORY: No Risk  Suicide Risk Assessment: Patient has following modifiable risk factors for suicide: medication noncompliance, which we are addressing by providing an oral and IM option. Patient has following non-modifiable or demographic risk factors for suicide: none Patient has the following protective factors against suicide: Supportive family, no history of suicide attempts, and no history of NSSIB  Thank you for this consult request. Recommendations have been communicated to the primary team.  We will continue to follow at this time.   Sharlot Becker, NP       History of Present Illness  Relevant Aspects of [place] Hospital Course:  Admitted on 09/21/2024 for a fall. They have memory issues that is increasing with confusion and refusal of meds at home and in the hospital.   Patient Report:  The client was sleeping prior to the assessment.  I was sleeping until you woke me up.  When this provider introduced herself, she said, Good, I need a few more of you  because everyone says I'm crazy.  Irritable throughout most of the assessment yet cooperative.  When asked about her depression, she reported, I don't know and I don't care, no suicidal ideations God no!.  She stated, I don't worry about anything here, seems to have anxiety at home however.  Her sleep is like yours, I guess.  Appetite is fine.  Denies hallucinations, paranoia, homicidal ideations.  Denied substance use and I use to smoke and stopped 40 years ago, cigarettes.  She has been refusing her medications to which she feels she does not need, just need to get out of this place yet reported she has no where to go.  When asked about the SNF, she stated, I'm not going to any nursing home.  Medications adjusted to assist with her mood which will hopefully improved her compliance.  Psych ROS:  Depression: I don't know and I don't care. Anxiety:  Denied Mania (lifetime and current): none Psychosis: (lifetime and current): none  Review of Systems  Psychiatric/Behavioral:  Positive for memory loss. The patient is nervous/anxious.   All other systems reviewed and are negative.    Psychiatric and Social History  Psychiatric History:  Information collected from patient and chart.  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  Exam Findings  Physical Exam: completed by the MD, reviewed. Vital Signs:  Temp:  [97.6 F (36.4 C)-98 F (36.7 C)] 98 F (36.7 C) (11/22 0955) Pulse Rate:  [81-88] 88 (11/22 0955) Resp:  [16-20] 16 (11/22  0955) BP: (105-144)/(61-78) 115/76 (11/22 0955) SpO2:  [96 %-97 %] 97 % (11/22 0955) Blood pressure 115/76, pulse 88, temperature 98 F (36.7 C), temperature source Oral, resp. rate 16, height 5' 6 (1.676 m), weight 87.2 kg, SpO2 97%. Body mass index is 31.03 kg/m.  Physical Exam  Mental Status Exam: General Appearance: Casual  Orientation:  person and place  Memory:  Immediate;   Fair Recent;   Poor Remote;   Fair  Concentration:  Concentration: Fair and Attention Span: Fair  Recall:  Fair to poor  Attention  Fair  Eye Contact:  Good  Speech:  Clear and Coherent  Language:  Good  Volume:  Normal  Mood: irritable  Affect:  Congruent  Thought Process:  Coherent  Thought Content:  goal oriented  Suicidal Thoughts:  No  Homicidal Thoughts:  No  Judgement:  Poor  Insight:  Lacking  Psychomotor Activity:  Decreased  Akathisia:  No  Fund of Knowledge:  Fair      Assets:  Housing Leisure Time Resilience Social Support  Cognition:  Impaired,  Moderate  ADL's:  Impaired  AIMS (if indicated):        Other History   These have been pulled in through the EMR, reviewed, and updated if appropriate.  Family History:  The patient's family history includes Cancer in her mother; Colon cancer (age of onset: 64) in her maternal aunt; Colon cancer (age of onset: 5) in her maternal uncle; Colon cancer (age of onset: 28) in her maternal uncle; Diabetes in her mother and another family member; Heart disease in her mother and another family member; Hyperlipidemia in her father, mother, and another family member; Hypertension in her father, mother, and another family member.  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   DEGENERATION, MACULAR NOS 03/14/2007   DEPRESSION 03/14/2007   DIABETES MELLITUS, TYPE II 03/14/2007   Diarrhea  05/31/2007   DISC DISEASE, LUMBAR 05/25/2009   DIVERTICULOSIS OF COLON 01/11/2009   HYPERLIPIDEMIA 06/21/2010   HYPERTENSION 03/14/2007   Lung abnormality    LT LUNG WITH SPOT HAS BEEN FOLLOWED X 3 YRS DR BURNEY   SEBORRHEIC KERATOSIS, INFLAMED 12/02/2010   Unspecified hearing loss 01/13/2010   VERTIGO 11/10/2010    Surgical History: Past Surgical History:  Procedure Laterality Date   CHOLECYSTECTOMY  1982   urosespsis post op   JOINT REPLACEMENT  1998  1998 RT KNEE  +2009 REMOVED, REPLACED JOINT   KNEE ARTHROSCOPY Right 1995   right, TNR   left shoulder Left 2012   rotator cuff   SPINE SURGERY  1996   fusion, ruptured disk   TONSILLECTOMY  1952     Medications:   Current Facility-Administered Medications:    acetaminophen  (TYLENOL ) tablet 650 mg, 650 mg, Oral, Q6H PRN **OR** acetaminophen  (TYLENOL ) suppository 650 mg, 650 mg, Rectal, Q6H PRN, Tobie, Vishal R, MD   albuterol  (PROVENTIL ) (2.5 MG/3ML) 0.083% nebulizer solution 3 mL, 3 mL, Inhalation, Q6H PRN, Tobie Jorie SAUNDERS, MD   amLODipine  (NORVASC ) tablet 5 mg, 5 mg, Oral, Daily, Akula, Vijaya, MD, 5 mg at 09/26/24 1017   bisacodyl  (DULCOLAX) EC tablet 5 mg, 5 mg, Oral, Daily PRN, Tobie Jorie SAUNDERS, MD   citalopram  (CELEXA ) tablet 20 mg, 20 mg, Oral, Daily, Tobie, Vishal R, MD, 20 mg at 09/26/24 1017   cyanocobalamin  (VITAMIN B12) tablet 1,000 mcg, 1,000 mcg, Oral, Daily, Krishnan, Gokul, MD   enoxaparin  (LOVENOX ) injection 40 mg, 40 mg, Subcutaneous, Q24H, Patel, Vishal R, MD, 40 mg at 09/27/24 9162   haloperidol  lactate (HALDOL ) injection 2 mg, 2 mg, Intravenous, Q6H PRN, Akula, Vijaya, MD   hydrALAZINE  (APRESOLINE ) injection 10 mg, 10 mg, Intravenous, Q4H PRN, Tobie, Vishal R, MD   insulin  aspart (novoLOG ) injection 0-9 Units, 0-9 Units, Subcutaneous, TID WC, Patel, Vishal R, MD, 1 Units at 09/27/24 1234   losartan  (COZAAR ) tablet 25 mg, 25 mg, Oral, Daily, Krishnan, Gokul, MD, 25 mg at 09/26/24 1017   OLANZapine  (ZYPREXA ) tablet  2.5 mg, 2.5 mg, Oral, BID **OR** OLANZapine  (ZYPREXA ) injection 2.5 mg, 2.5 mg, Intramuscular, BID, Mardella Nuckles Y, NP   ondansetron  (ZOFRAN ) tablet 4 mg, 4 mg, Oral, Q6H PRN **OR** ondansetron  (ZOFRAN ) injection 4 mg, 4 mg, Intravenous, Q6H PRN, Tobie Jorie SAUNDERS, MD   Oral care mouth rinse, 15 mL, Mouth Rinse, PRN, Akula, Vijaya, MD   oxyCODONE  (Oxy IR/ROXICODONE ) immediate release tablet 2.5 mg, 2.5 mg, Oral, Q6H PRN, Akula, Vijaya, MD, 2.5 mg at 09/22/24 1639   senna-docusate (Senokot-S) tablet 1 tablet, 1 tablet, Oral, QHS PRN, Tobie, Vishal R, MD   sodium chloride  flush (NS) 0.9 % injection 3 mL, 3 mL, Intravenous, Q12H, Tobie Jorie R, MD, 3 mL at 09/26/24 1022  Allergies: Allergies  Allergen Reactions   Diphenhydramine Other (See Comments)    Makes her hyperactive    Morphine And Codeine Itching    Patient states that she can still take this medication it just makes her itch.    Sharlot Becker, NP

## 2024-09-27 NOTE — Plan of Care (Signed)
  Problem: Pain Managment: Goal: General experience of comfort will improve and/or be controlled Outcome: Completed/Met   Problem: Safety: Goal: Ability to remain free from injury will improve Outcome: Completed/Met

## 2024-09-27 NOTE — Progress Notes (Addendum)
 [facility]                                                                               Lynn [name], is a 78 y.o. female, DOB - 10-10-1946, FMW:[id number] Admit date - 09/21/2024    Outpatient Primary MD for the patient is Lynn Lauraine BRAVO, NP   Brief summary   Lynn [name] is a 78 y.o. female with medical history significant for T2DM, HTN, HLD, CKD stage III, asthma, depression, cognitive impairment/suspected underlying dementia who is admitted after unwitnessed fall at home with prolonged downtime. She was admitted for mild rhabdomyolysis  Assessment & Plan   Unwitnessed fall at home Mild rhabdomyolysis CK on admission at 321 improved to 159 with IV fluids.  Mildly elevated troponins Patient currently denies any chest pain or shortness of breath EKG shows sinus rhythm with mild ST elevation in the lateral leads.  Echocardiogram shows normal LVEF without any wall motion abnormalities. Seen by cardiology.  No plans for further cardiac testing at this time.  Type 2 DM Continue with SSI.  HbA1c 6.6. Monitor CBGs.   Underlying dementia/ cognitive impairment. With intermittent agitation.  Stable for the most part. At times patient is lucid but at other times she is extremely uncooperative.  She refused to go to SNF yesterday.  She was started on Seroquel  but she refused her dose last night.  Patient was told that she needs to decide what she wants to do going forward.  Staying in the hospital is not a long-term option. Patient continues to be belligerent and continues to refuse medications and meals then we may need to involve psychiatry to assist with management. Patient behaving irrationally. Refusing meds. Refusing to eat. She needs haldol  to help control her behavior. Can be given one dose against her wishes. Will also involve psychiatry to assist with management.  Depression Continue Celexa   Hyperlipidemia On statin at home.   Stage 3a  ckd Creatinine at baseline.   Mild Leukocytosis.  Afebrile.  WBC is noted to be normal today.  Was likely reactive.    Hypertensive urgency Continue with amlodipine  and losartan .  Goals of care Seen by palliative care.  Now DNR.  Code Status: DNR.  DVT Prophylaxis:  enoxaparin  (LOVENOX ) injection 40 mg Start: 09/22/24 2200 Family Communication: No family at bedside today Disposition: SNF but patient refused to go yesterday.  Procedures:  None  Consultants:   Palliative care consult Cardiology.    Medications  Scheduled Meds:  amLODipine   5 mg Oral Daily   citalopram   20 mg Oral Daily   enoxaparin  (LOVENOX ) injection  40 mg Subcutaneous Q24H   insulin  aspart  0-9 Units Subcutaneous TID WC   losartan   25 mg Oral Daily   QUEtiapine   25 mg Oral BID   sodium chloride  flush  3 mL Intravenous Q12H   Continuous Infusions: PRN Meds:.acetaminophen  **OR** acetaminophen , albuterol , bisacodyl , haloperidol  lactate, hydrALAZINE , ondansetron  **OR** ondansetron  (ZOFRAN ) IV, mouth rinse, oxyCODONE , senna-docusate    Subjective:   Patient wanted me to leave her alone.  She denies any pain.  She refused to engage with me despite multiple efforts on my end.  Does not want  to go to SNF.  Also does not want to go home.  Objective:   Vitals:   09/26/24 0437 09/26/24 1027 09/26/24 1812 09/27/24 0558  BP: (!) 156/108 106/64 105/61 (!) 144/78  Pulse: 97 100 83 81  Resp: 18 18 20 18   Temp: (!) 97.5 F (36.4 C)  97.8 F (36.6 C) 97.6 F (36.4 C)  TempSrc: Axillary  Oral Oral  SpO2: 96% 95% 96% 97%  Weight:      Height:        Intake/Output Summary (Last 24 hours) at 09/27/2024 0928 Last data filed at 09/26/2024 1300 Gross per 24 hour  Intake 370 ml  Output --  Net 370 ml    Filed Weights   09/23/24 1816  Weight: 87.2 kg   General appearance: Awake alert.  In no distress.  Distracted.  Not very cooperative. Resp: Clear to auscultation bilaterally.  Normal effort Cardio:  S1-S2 is normal regular.  No S3-S4.  No rubs murmurs or bruit GI: Abdomen is soft.  Nontender nondistended.  Bowel sounds are present normal.  No masses organomegaly    Data Reviewed:       Latest Ref Rng & Units 09/25/2024    4:47 AM 09/24/2024    5:14 AM 09/22/2024   11:24 AM  CBC  WBC 4.0 - 10.5 K/uL 7.1  6.9  11.0   Hemoglobin 12.0 - 15.0 g/dL 83.8  83.8  83.5   Hematocrit 36.0 - 46.0 % 48.1  47.7  48.9   Platelets 150 - 400 K/uL 194  185  183       Latest Ref Rng & Units 09/25/2024    4:47 AM 09/24/2024    5:14 AM 09/22/2024   11:24 AM  CMP  Glucose 70 - 99 mg/dL 872  880  848   BUN 8 - 23 mg/dL 17  14  18    Creatinine 0.44 - 1.00 mg/dL 8.98  8.98  8.84   Sodium 135 - 145 mmol/L 134  137  136   Potassium 3.5 - 5.1 mmol/L 3.8  3.3  3.5   Chloride 98 - 111 mmol/L 99  101  102   CO2 22 - 32 mmol/L 21  22  22    Calcium  8.9 - 10.3 mg/dL 9.0  9.0  8.7    CBG (last 3)  Recent Labs    09/26/24 1649 09/26/24 2142 09/27/24 0735  GLUCAP 206* 219* 138*     Radiology Studies: No results found.   Joette Pebbles M.D. [facility] 09/27/2024, 9:28 AM  Available via Epic secure chat 7am-7pm After 7 pm, please refer to night coverage provider listed on amion.

## 2024-09-27 NOTE — Progress Notes (Signed)
 Patient refused one time Haldol  IM injection, stating I don't want no shot, if you come near me with that I will call the police and have you arrested. MD made aware and per MD Verdene ok to give one time dose against patient's wishes. One assist of nurse tech needed for administration, patient became agitated and attempting to hit and spit. Injection given and patient is in no current distress, appears calm. Bed in lowest position and call light within reach.

## 2024-09-27 NOTE — Progress Notes (Signed)
 Patient refused all PO medications this morning. Stating I don't want any pills, I'll throw them all up. Patient refuses any nausea at the moment, educated on the importance of the medications, patient still refused x2 attempts, patient stated it's too early, I don't want any meds. MD made aware. Bed in lowest position, call light within reach, and bed alarm on.

## 2024-09-27 NOTE — Plan of Care (Addendum)
 Patient refused the night time vitals and night meds,She refused to turn around and change the position .Explained the importance of changing position to prevent bed sores but she says 'leave me alone' and denied the care given to her. Problem: Elimination: Goal: Will not experience complications related to urinary retention 09/27/2024 0559 by Arna Rhymes, RN Outcome: Progressing 09/27/2024 0559 by Arna Rhymes, RN Reactivated   Problem: Pain Managment: Goal: General experience of comfort will improve and/or be controlled 09/27/2024 0559 by Arna Rhymes, RN Outcome: Progressing 09/27/2024 0559 by Arna Rhymes, RN Reactivated   Problem: Safety: Goal: Ability to remain free from injury will improve 09/27/2024 0559 by Arna Rhymes, RN Outcome: Progressing 09/27/2024 0559 by Arna Rhymes, RN Reactivated

## 2024-09-28 DIAGNOSIS — N1831 Chronic kidney disease, stage 3a: Secondary | ICD-10-CM | POA: Diagnosis not present

## 2024-09-28 DIAGNOSIS — E1159 Type 2 diabetes mellitus with other circulatory complications: Secondary | ICD-10-CM | POA: Diagnosis not present

## 2024-09-28 DIAGNOSIS — F03911 Unspecified dementia, unspecified severity, with agitation: Secondary | ICD-10-CM | POA: Diagnosis not present

## 2024-09-28 DIAGNOSIS — F03918 Unspecified dementia, unspecified severity, with other behavioral disturbance: Secondary | ICD-10-CM | POA: Diagnosis not present

## 2024-09-28 DIAGNOSIS — E1169 Type 2 diabetes mellitus with other specified complication: Secondary | ICD-10-CM | POA: Diagnosis not present

## 2024-09-28 DIAGNOSIS — E785 Hyperlipidemia, unspecified: Secondary | ICD-10-CM | POA: Diagnosis not present

## 2024-09-28 DIAGNOSIS — J45909 Unspecified asthma, uncomplicated: Secondary | ICD-10-CM | POA: Diagnosis not present

## 2024-09-28 DIAGNOSIS — E1122 Type 2 diabetes mellitus with diabetic chronic kidney disease: Secondary | ICD-10-CM | POA: Diagnosis not present

## 2024-09-28 DIAGNOSIS — I129 Hypertensive chronic kidney disease with stage 1 through stage 4 chronic kidney disease, or unspecified chronic kidney disease: Secondary | ICD-10-CM | POA: Diagnosis not present

## 2024-09-28 LAB — COMPREHENSIVE METABOLIC PANEL WITH GFR
ALT: 11 U/L (ref 0–44)
AST: 15 U/L (ref 15–41)
Albumin: 2.5 g/dL — ABNORMAL LOW (ref 3.5–5.0)
Alkaline Phosphatase: 68 U/L (ref 38–126)
Anion gap: 3 — ABNORMAL LOW (ref 5–15)
BUN: 23 mg/dL (ref 8–23)
CO2: 31 mmol/L (ref 22–32)
Calcium: 9.5 mg/dL (ref 8.9–10.3)
Chloride: 102 mmol/L (ref 98–111)
Creatinine, Ser: 1.11 mg/dL — ABNORMAL HIGH (ref 0.44–1.00)
GFR, Estimated: 51 mL/min — ABNORMAL LOW (ref >60)
Glucose, Bld: 96 mg/dL (ref 70–99)
Potassium: 3.8 mmol/L (ref 3.5–5.1)
Sodium: 136 mmol/L (ref 135–145)
Total Bilirubin: 1 mg/dL (ref 0.0–1.2)
Total Protein: 5 g/dL — ABNORMAL LOW (ref 6.5–8.1)

## 2024-09-28 LAB — CBC
HCT: 47.7 % — ABNORMAL HIGH (ref 36.0–46.0)
Hemoglobin: 15.6 g/dL — ABNORMAL HIGH (ref 12.0–15.0)
MCH: 29.9 pg (ref 26.0–34.0)
MCHC: 32.7 g/dL (ref 30.0–36.0)
MCV: 91.4 fL (ref 80.0–100.0)
Platelets: 193 K/uL (ref 150–400)
RBC: 5.22 MIL/uL — ABNORMAL HIGH (ref 3.87–5.11)
RDW: 13.5 % (ref 11.5–15.5)
WBC: 7.6 K/uL (ref 4.0–10.5)
nRBC: 0 % (ref 0.0–0.2)

## 2024-09-28 LAB — FOLATE: Folate: 6.4 ng/mL (ref >5.9)

## 2024-09-28 LAB — GLUCOSE, CAPILLARY
Glucose-Capillary: 123 mg/dL — ABNORMAL HIGH (ref 70–99)
Glucose-Capillary: 210 mg/dL — ABNORMAL HIGH (ref 70–99)
Glucose-Capillary: 191 mg/dL — ABNORMAL HIGH (ref 70–99)
Glucose-Capillary: 166 mg/dL — ABNORMAL HIGH (ref 70–99)

## 2024-09-28 LAB — VITAMIN B12: Vitamin B-12: 927 pg/mL — ABNORMAL HIGH (ref 180–914)

## 2024-09-28 LAB — MAGNESIUM: Magnesium: 1.8 mg/dL (ref 1.7–2.4)

## 2024-09-28 LAB — PHOSPHORUS: Phosphorus: 3.2 mg/dL (ref 2.5–4.6)

## 2024-09-28 MED ORDER — OLANZAPINE 10 MG IM SOLR
2.5000 mg | Freq: Two times a day (BID) | INTRAMUSCULAR | Status: DC
  Filled 2024-09-28 (×17): qty 10

## 2024-09-28 MED ORDER — OLANZAPINE 10 MG IM SOLR
5.0000 mg | Freq: Two times a day (BID) | INTRAMUSCULAR | Status: DC
  Filled 2024-09-28: qty 10

## 2024-09-28 MED ORDER — OLANZAPINE 5 MG PO TABS
2.5000 mg | ORAL_TABLET | Freq: Two times a day (BID) | ORAL | Status: DC
  Administered 2024-09-28 – 2024-10-06 (×13): 2.5 mg via ORAL
  Filled 2024-09-28 (×18): qty 1

## 2024-09-28 MED ORDER — OLANZAPINE 5 MG PO TABS: 5.0000 mg | ORAL_TABLET | Freq: Two times a day (BID) | ORAL | Status: DC

## 2024-09-28 NOTE — Plan of Care (Signed)
 Patient still refuses to change the position and is combative if we try to help her with positioning and care. Problem: Elimination: Goal: Will not experience complications related to urinary retention Outcome: Progressing   Problem: Pain Managment: Goal: General experience of comfort will improve and/or be controlled 09/28/2024 0717 by Arna Rhymes, RN Outcome: Progressing 09/28/2024 0717 by Arna Rhymes, RN Reactivated   Problem: Safety: Goal: Ability to remain free from injury will improve 09/28/2024 0717 by Arna Rhymes, RN Outcome: Progressing 09/28/2024 0717 by Arna Rhymes, RN Reactivated

## 2024-09-28 NOTE — Progress Notes (Signed)
 Patient blood pressure 89/56, nonsymptomatic. MD made aware, rechecked after an hour BP 89/60. No symptoms present, patient is sitting upright in bed, all needs met at this time. Bed in lowest position, bed alarm on, and call light within reach.

## 2024-09-28 NOTE — Progress Notes (Signed)
 [facility]                                                                               [name], is a 78 y.o. female, DOB - 26-Sep-1946, FMW:[id number] Admit date - 09/21/2024    Outpatient Primary MD for the patient is [facility] Lauraine BRAVO, NP   Brief summary   Lynn [name] is a 79 y.o. female with medical history significant for T2DM, HTN, HLD, CKD stage III, asthma, depression, cognitive impairment/suspected underlying dementia who is admitted after unwitnessed fall at home with prolonged downtime. She was admitted for mild rhabdomyolysis  Assessment & Plan   Unwitnessed fall at home Mild rhabdomyolysis CK on admission at 321 improved to 159 with IV fluids.  Mildly elevated troponins Patient currently denies any chest pain or shortness of breath EKG shows sinus rhythm with mild ST elevation in the lateral leads.  Echocardiogram shows normal LVEF without any wall motion abnormalities. Seen by cardiology.  No plans for further cardiac testing at this time.  Type 2 DM Continue with SSI.  HbA1c 6.6. Monitor CBGs.   Underlying dementia/ cognitive impairment. With intermittent agitation.  Stable for the most part. At times patient is lucid but at other times she is extremely uncooperative.  She refused to go to SNF on Friday.  She was started on Seroquel  but she refused her dose last night.  Patient was told that she needs to decide what she wants to do going forward.  Staying in the hospital is not a long-term option. Patient continues to have episodes of agitation and belligerent behavior.  She seems to be rational.  Does not have any insight into her medical issues.  Does not appear to have medical capacity. Psychiatry was consulted to assist with management.  They have started the patient on olanzapine .  Celexa  is being continued.  Patient had to be given a dose of Haldol  yesterday. Patient continues to refuse her medications.  Her oral intake is  poor.  Depression Continue Celexa .  See above  Hyperlipidemia On statin at home.   Stage 3a ckd Creatinine at baseline.   Mild Leukocytosis.  Likely reactive.  Resolved.  Essential hypertension Continue with amlodipine  and losartan .  Blood pressure is reasonably well-controlled.  Goals of care Seen by palliative care.  Now DNR.  Code Status: DNR.  DVT Prophylaxis:  enoxaparin  (LOVENOX ) injection 40 mg Start: 09/22/24 2200 Family Communication: No family at bedside today Disposition: SNF but patient refused to go.  Procedures:  None  Consultants:   Palliative care consult Cardiology.  Psychiatry   Medications  Scheduled Meds:  amLODipine   5 mg Oral Daily   citalopram   20 mg Oral Daily   vitamin B-12  1,000 mcg Oral Daily   enoxaparin  (LOVENOX ) injection  40 mg Subcutaneous Q24H   insulin  aspart  0-9 Units Subcutaneous TID WC   losartan   25 mg Oral Daily   OLANZapine   5 mg Oral BID   Or   OLANZapine   5 mg Intramuscular BID   sodium chloride  flush  3 mL Intravenous Q12H   Continuous Infusions: PRN Meds:.acetaminophen  **OR** acetaminophen , albuterol , bisacodyl , haloperidol  lactate, hydrALAZINE , ondansetron  **OR** ondansetron  (  ZOFRAN ) IV, mouth rinse, oxyCODONE , senna-docusate    Subjective:   Patient noted to be lying on the bed with eyes closed.  Not very cooperative.  Does not answer questions appropriately.  Does not appear to be in any pain or discomfort.    Objective:   Vitals:   09/27/24 1757 09/27/24 2137 09/28/24 0536 09/28/24 0842  BP: (!) 140/61 96/60 137/64 (!) 111/58  Pulse: 81 60 74   Resp: 16 14 16    Temp: (!) 97.5 F (36.4 C) 98.2 F (36.8 C) 98 F (36.7 C)   TempSrc: Oral  Oral   SpO2: 94% 96% 94%   Weight:      Height:        Intake/Output Summary (Last 24 hours) at 09/28/2024 9061 Last data filed at 09/27/2024 2330 Gross per 24 hour  Intake 0 ml  Output 650 ml  Net -650 ml    Filed Weights   09/23/24 1816  Weight: 87.2  kg   General appearance: Lying on the bed with eyes closed.  Uncooperative Resp: Clear to auscultation bilaterally.  Normal effort Cardio: S1-S2 is normal regular.  No S3-S4.  No rubs murmurs or bruit GI: Abdomen is soft.  Nontender nondistended.  Bowel sounds are present normal.  No masses organomegaly Extremities: Noted to be moving her extremities. No obvious focal neurological deficits.   Data Reviewed:       Latest Ref Rng & Units 09/28/2024    6:15 AM 09/25/2024    4:47 AM 09/24/2024    5:14 AM  CBC  WBC 4.0 - 10.5 K/uL 7.6  7.1  6.9   Hemoglobin 12.0 - 15.0 g/dL 84.3  83.8  83.8   Hematocrit 36.0 - 46.0 % 47.7  48.1  47.7   Platelets 150 - 400 K/uL 193  194  185       Latest Ref Rng & Units 09/28/2024    6:15 AM 09/25/2024    4:47 AM 09/24/2024    5:14 AM  CMP  Glucose 70 - 99 mg/dL 96  872  880   BUN 8 - 23 mg/dL 23  17  14    Creatinine 0.44 - 1.00 mg/dL 8.88  8.98  8.98   Sodium 135 - 145 mmol/L 136  134  137   Potassium 3.5 - 5.1 mmol/L 3.8  3.8  3.3   Chloride 98 - 111 mmol/L 102  99  101   CO2 22 - 32 mmol/L 31  21  22    Calcium  8.9 - 10.3 mg/dL 9.5  9.0  9.0   Total Protein 6.5 - 8.1 g/dL 5.0     Total Bilirubin 0.0 - 1.2 mg/dL 1.0     Alkaline Phos 38 - 126 U/L 68     AST 15 - 41 U/L 15     ALT 0 - 44 U/L 11      CBG (last 3)  Recent Labs    09/27/24 1643 09/27/24 2006 09/28/24 0759  GLUCAP 204* 121* 123*     Radiology Studies: No results found.   Joette Pebbles M.D. [facility] 09/28/2024, 9:38 AM  Available via Epic secure chat 7am-7pm After 7 pm, please refer to night coverage provider listed on amion.

## 2024-09-29 LAB — GLUCOSE, CAPILLARY
Glucose-Capillary: 155 mg/dL — ABNORMAL HIGH (ref 70–99)
Glucose-Capillary: 129 mg/dL — ABNORMAL HIGH (ref 70–99)
Glucose-Capillary: 149 mg/dL — ABNORMAL HIGH (ref 70–99)
Glucose-Capillary: 147 mg/dL — ABNORMAL HIGH (ref 70–99)

## 2024-09-29 MED ORDER — STERILE WATER FOR INJECTION IJ SOLN
INTRAMUSCULAR | Status: AC
  Filled 2024-09-29: qty 10

## 2024-09-29 NOTE — Progress Notes (Signed)
 OT Cancellation Note  Patient Details Name: LEILONI SMITHERS MRN: [id number] DOB: 1946-11-05   Cancelled Treatment:    Reason Eval/Treat Not Completed: Patient declined, no reason specified Patient refusing to participate despite max encouragement. OT will follow back as time permits.   Ronal Gift E. Mirenda Baltazar, OTR/L Acute Rehabilitation Services [phone]   Ronal Gift Salt 09/29/2024, 11:40 AM

## 2024-09-29 NOTE — Plan of Care (Signed)
  Problem: Elimination: Goal: Will not experience complications related to urinary retention Outcome: Progressing   Problem: Pain Managment: Goal: General experience of comfort will improve and/or be controlled Outcome: Progressing

## 2024-09-29 NOTE — Progress Notes (Signed)
 [facility]                                                                               Lynn [name], is a 78 y.o. female, DOB - 05-19-1946, FMW:[id number] Admit date - 09/21/2024    Outpatient Primary MD for the patient is [facility] Lauraine BRAVO, NP   Brief summary   Lynn [name] is a 78 y.o. female with medical history significant for T2DM, HTN, HLD, CKD stage III, asthma, depression, cognitive impairment/suspected underlying dementia who is admitted after unwitnessed fall at home with prolonged downtime. She was admitted for mild rhabdomyolysis  Assessment & Plan   Dementia with behavioral disturbance  With intermittent agitation.  At times patient is lucid but at other times she is extremely uncooperative.  She refused to go to SNF on Friday.  She was started on Seroquel  but she refused to take it.  Patient continues to have episodes of agitation and belligerent behavior.  She seems to be rational.  Does not have any insight into her medical issues.  Does not appear to have medical capacity. Psychiatry was consulted to assist with management.  They have started the patient on olanzapine .  Celexa  is being continued.   Wait for improvement in her behavior  Essential hypertension Experienced hypotension after she was given amlodipine  and losartan  yesterday.  These medications have been held.  Her pressure has rebounded.  Continue to monitor trends.  Will resume only the amlodipine  going forward if needed.    Unwitnessed fall at home Mild rhabdomyolysis CK on admission at 321 improved to 159 with IV fluids.  Mildly elevated troponins Patient currently denies any chest pain or shortness of breath EKG shows sinus rhythm with mild ST elevation in the lateral leads.  Echocardiogram shows normal LVEF without any wall motion abnormalities. Seen by cardiology.  No plans for further cardiac testing at this time.  Type 2 DM Continue with SSI.  HbA1c 6.6. CBGs are  reasonably well-controlled.  Depression Continue Celexa .  See above  Hyperlipidemia On statin at home.   Stage 3a ckd Creatinine at baseline.   Mild Leukocytosis.  Likely reactive.  Resolved.  Goals of care Seen by palliative care.  Now DNR.  Code Status: DNR.  DVT Prophylaxis:  enoxaparin  (LOVENOX ) injection 40 mg Start: 09/22/24 2200 Family Communication: No family at bedside today Disposition: SNF but patient refused to go.  Procedures:  None  Consultants:   Palliative care consult Cardiology.  Psychiatry   Medications  Scheduled Meds:  citalopram   20 mg Oral Daily   vitamin B-12  1,000 mcg Oral Daily   enoxaparin  (LOVENOX ) injection  40 mg Subcutaneous Q24H   insulin  aspart  0-9 Units Subcutaneous TID WC   OLANZapine   2.5 mg Oral BID   Or   OLANZapine   2.5 mg Intramuscular BID   sodium chloride  flush  3 mL Intravenous Q12H   Continuous Infusions: PRN Meds:.acetaminophen  **OR** acetaminophen , albuterol , bisacodyl , haloperidol  lactate, ondansetron  **OR** ondansetron  (ZOFRAN ) IV, mouth rinse, oxyCODONE , senna-docusate    Subjective:   Patient refuses to engage with me today.  Wants me to leave the room.  Refuses examination.    Objective:  Vitals:   09/28/24 1727 09/28/24 2144 09/29/24 0457 09/29/24 1007  BP: 121/75 (!) 148/91 117/65 (!) 157/94  Pulse: 79 80 67 79  Resp: 17 17 18 16   Temp: (!) 97.4 F (36.3 C) 98.6 F (37 C) (!) 97.4 F (36.3 C) 98.2 F (36.8 C)  TempSrc: Axillary Oral Oral Oral  SpO2: 96% 95% 93% 96%  Weight:      Height:        Intake/Output Summary (Last 24 hours) at 09/29/2024 1012 Last data filed at 09/28/2024 1545 Gross per 24 hour  Intake 110 ml  Output --  Net 110 ml    Filed Weights   09/23/24 1816  Weight: 87.2 kg   Patient refused to be examined.   Data Reviewed:       Latest Ref Rng & Units 09/28/2024    6:15 AM 09/25/2024    4:47 AM 09/24/2024    5:14 AM  CBC  WBC 4.0 - 10.5 K/uL 7.6  7.1  6.9    Hemoglobin 12.0 - 15.0 g/dL 84.3  83.8  83.8   Hematocrit 36.0 - 46.0 % 47.7  48.1  47.7   Platelets 150 - 400 K/uL 193  194  185       Latest Ref Rng & Units 09/28/2024    6:15 AM 09/25/2024    4:47 AM 09/24/2024    5:14 AM  CMP  Glucose 70 - 99 mg/dL 96  872  880   BUN 8 - 23 mg/dL 23  17  14    Creatinine 0.44 - 1.00 mg/dL 8.88  8.98  8.98   Sodium 135 - 145 mmol/L 136  134  137   Potassium 3.5 - 5.1 mmol/L 3.8  3.8  3.3   Chloride 98 - 111 mmol/L 102  99  101   CO2 22 - 32 mmol/L 31  21  22    Calcium  8.9 - 10.3 mg/dL 9.5  9.0  9.0   Total Protein 6.5 - 8.1 g/dL 5.0     Total Bilirubin 0.0 - 1.2 mg/dL 1.0     Alkaline Phos 38 - 126 U/L 68     AST 15 - 41 U/L 15     ALT 0 - 44 U/L 11      CBG (last 3)  Recent Labs    09/28/24 1723 09/28/24 2107 09/29/24 0740  GLUCAP 191* 166* 155*     Radiology Studies: No results found.  This is a no charge note.  Joette Pebbles M.D. [facility] 09/29/2024, 10:12 AM  Available via Epic secure chat 7am-7pm After 7 pm, please refer to night coverage provider listed on amion.

## 2024-09-29 NOTE — Plan of Care (Signed)
  Problem: Elimination: Goal: Will not experience complications related to urinary retention Outcome: Progressing   Problem: Pain Managment: Goal: General experience of comfort will improve and/or be controlled Outcome: Progressing   Problem: Safety: Goal: Ability to remain free from injury will improve Outcome: Progressing

## 2024-09-29 NOTE — Progress Notes (Signed)
 PT Cancellation Note  Patient Details Name: Lynn [name] MRN: [id number] DOB: 09/01/1946   Cancelled Treatment:    Reason Eval/Treat Not Completed: Other (comment). Attempted to see patient at 11am, pt adamantly refused stating I'm sleeping. I don't need to get up. Pt continued to refuse despite education on importance of mobility. Pt's cousin asked PT to return, pt was then eating lunch. PT returned again 20 minutes later and pt continued to refuse. Pt's cousin thanked PT for trying. Acute PT to return as able to attempt to progress mobility.  Lynn [name], PT, DPT Acute Rehabilitation Services Secure chat preferred Office #: [phone]    Lynn [name] 09/29/2024, 12:27 PM

## 2024-09-29 NOTE — Consult Note (Signed)
 [facility] Psychiatric Consult Initial  Patient Name: .Lynn [name]  MRN: [id number]  DOB: [birth year]  Consult Order details:  Orders (From admission, onward)     Start     Ordered   09/27/24 1202  IP CONSULT TO PSYCHIATRY       Ordering Provider: Verdene Purchase, MD  Provider:  (Not yet assigned)  Question Answer Comment  Location [facility]   Reason for Consult? patient with dementia, behaving irrationally, refusing SNF but also doesn't want to leave the hospital. refusing meds.no active medical issue. Please assist with management.      09/27/24 1202             Mode of Visit: In person    Psychiatry Consult Evaluation  Service Date: September 29, 2024 LOS:  LOS: 1 day  Chief Complaint Everybody says I'm crazy.  Primary Psychiatric Diagnoses  Agitation related to dementia/cognitive decline  Assessment  Lynn [name] is a 78 y.o. female admitted: Medicallyfor 09/21/2024  6:10 PM for a fall.  Dr. Tobie wrote, with medical history significant for T2DM, HTN, HLD, CKD stage III, asthma, depression, cognitive impairment/suspected underlying dementia who presented to the ED from home for evaluation after she was found down on her floor.   Patient lives alone.  She has had progressive decline in ability to care for self and memory.  Family states that she is post to ambulate with a walker but she never uses it.  She has not been taking any of her medications other than Ambien .  She often refuses help from family.  She has not been bathing herself.  She has not been able to take care of her finances.  Her cousins have been having to take care of all of these things.  She has not left her house since her last PCP visit.    Family do call her twice a day and check in on her to make sure she is doing okay.  They last spoke to her last night around 6:30 PM.  She did not answer the the phone today therefore her cousin went to her house to check in on  her.  They found her lying on the hardwood floor undressed.  They tried to get her up.  She was refusing to get up and also thought that she was laying in her own bed.  Family stated that she has confusion at baseline but this appeared worse today.   EMS were called.  Patient was refusing to get up with EMS/fire.  I spent an hour and a half trying to assist her.  Per ED triage documentation patient was combative with EMS and she was given 5 mg of Haldol  and she calmed down afterwards.  Her current presentation of confusion, refusing medications, memory issues, aggressive at times is most consistent with agitation related to dementia (per Dr. Darlean notes). Current outpatient psychotropic medications include Celexa  and Seroquel  and historically she has had minimal response to these medications as she is non compliant with medications prior to admission as evidenced by self and family report. On initial examination, patient was sleeping and irritable. Please see plan below for detailed recommendations.   Diagnoses:  Active Hospital problems: Principal Problem:   Fall at home, initial encounter Active Problems:   T2DM (type 2 diabetes mellitus) (HCC)   Dyslipidemia associated with T2DM (HCC)   HTN associated with DM (HCC)   Asthma   Chronic kidney disease, stage 3a (HCC)   Agitation  due to dementia [facility])    Plan   ## Psychiatric Medication Recommendations:  -Continue Zyprexa  2.5 mg BID oral or IM until mood stabilized and takes oral medications.  On discharge she can return to Seroquel  and discontinue Zyprexa . -Continue Celexa  20 mg daily  ## Medical Decision Making Capacity: Patient does not have capacity to refuse SNF placement or medical treatment at this time. Continue to reassess capacity daily.   ## Further Work-up:  -- most recent EKG on 11/16 had QtC of 446 and 458, 11/17 was 501, repeat order placed -- Pertinent labwork reviewed earlier this admission includes: CBC with diff,  EKG, chem panel, and u/a   ## Disposition:-- There are no psychiatric contraindications to discharge at this time  ## Behavioral / Environmental: - No specific recommendations at this time.     ## Safety and Observation Level:  - Based on my clinical evaluation, I estimate the patient to be at minimal risk of self harm in the current setting. - At this time, we recommend  routine. This decision is based on my review of the chart including patient's history and current presentation, interview of the patient, mental status examination, and consideration of suicide risk including evaluating suicidal ideation, plan, intent, suicidal or self-harm behaviors, risk factors, and protective factors. This judgment is based on our ability to directly address suicide risk, implement suicide prevention strategies, and develop a safety plan while the patient is in the clinical setting. Please contact our team if there is a concern that risk level has changed.  CSSR Risk Category:C-SSRS RISK CATEGORY: No Risk  Suicide Risk Assessment: Patient has following modifiable risk factors for suicide: medication noncompliance, which we are addressing by providing an oral and IM option. Patient has following non-modifiable or demographic risk factors for suicide: none Patient has the following protective factors against suicide: Supportive family, no history of suicide attempts, and no history of NSSIB  Thank you for this consult request. Recommendations have been communicated to the primary team.  We will continue to follow at this time.   Lynn LITTIE Glatter, DO       History of Present Illness  Relevant Aspects of [facility] Course:  Admitted on 09/21/2024 for a fall. They have memory issues that is increasing with confusion and refusal of meds at home and in the hospital.   Patient Report:  09/28/2024 The client was sleeping prior to the assessment.  I was sleeping until you woke me up.  When this  provider introduced herself, she said, Good, I need a few more of you because everyone says I'm crazy.  Irritable throughout most of the assessment yet cooperative.  When asked about her depression, she reported, I don't know and I don't care, no suicidal ideations God no!.  She stated, I don't worry about anything here, seems to have anxiety at home however.  Her sleep is like yours, I guess.  Appetite is fine.  Denies hallucinations, paranoia, homicidal ideations.  Denied substance use and I use to smoke and stopped 40 years ago, cigarettes.  She has been refusing her medications to which she feels she does not need, just need to get out of this place yet reported she has no where to go.  When asked about the SNF, she stated, I'm not going to any nursing home.  Medications adjusted to assist with her mood which will hopefully improved her compliance.  09/29/2024 Patient seen laying in bed this morning on my approach. She stated she  did not need to see psychiatry because she's not crazy. The patient is disoriented to place, time, and situation stating it's winter time, she's in her house, and she did not understand why the treatment team was recommending SNF placement. She feels that she can take care of herself in her home that she is currently in. She denies any SI/HI/AVH.  Psych ROS:  Depression: I don't know and I don't care. Anxiety:  Denied Mania (lifetime and current): none Psychosis: (lifetime and current): none  Review of Systems  Psychiatric/Behavioral:  Positive for memory loss. The patient is nervous/anxious.   All other systems reviewed and are negative.    Psychiatric and Social History  Psychiatric History:  Information collected from patient and chart.  Prev Dx/Sx: depression, memory issues Current Psych Provider: none Home Meds (current): Celexa , Seroquel  Previous Med Trials: unknown Therapy: none  Prior Psych Hospitalization: none  Prior Self Harm:  none  Social History:  Occupational Hx: retired [employer] Hx: none Living Situation: lived alone, scheduled to go to SNF  Access to weapons/lethal means: denied   Substance History No current or past substance abuse besides cigarettes that she quit using 40 years ago, per client.  Exam Findings  Physical Exam: completed by the MD, reviewed. Vital Signs:  Temp:  [97.4 F (36.3 C)-98.6 F (37 C)] 98.2 F (36.8 C) (11/24 1007) Pulse Rate:  [67-84] 79 (11/24 1007) Resp:  [16-18] 16 (11/24 1007) BP: (89-157)/(60-94) 157/94 (11/24 1007) SpO2:  [93 %-96 %] 96 % (11/24 1007) Blood pressure (!) 157/94, pulse 79, temperature 98.2 F (36.8 C), temperature source Oral, resp. rate 16, height 5' 6 (1.676 m), weight 87.2 kg, SpO2 96%. Body mass index is 31.03 kg/m.  Physical Exam  Mental Status Exam: General Appearance: Casual  Orientation:  person and place  Memory:  Immediate;   Fair Recent;   Poor Remote;   Fair  Concentration:  Concentration: Fair and Attention Span: Fair  Recall:  Fair to poor  Attention  Fair  Eye Contact:  Good  Speech:  Clear and Coherent  Language:  Good  Volume:  Normal  Mood: irritable  Affect:  Congruent  Thought Process:  Coherent  Thought Content:  goal oriented  Suicidal Thoughts:  No  Homicidal Thoughts:  No  Judgement:  Poor  Insight:  Lacking  Psychomotor Activity:  Decreased  Akathisia:  No  Fund of Knowledge:  Fair      Assets:  Housing Leisure Time Resilience Social Support  Cognition:  Impaired,  Moderate  ADL's:  Impaired  AIMS (if indicated):        Other History   These have been pulled in through the EMR, reviewed, and updated if appropriate.  Family History:  The patient's family history includes Cancer in her mother; Colon cancer (age of onset: 90) in her maternal aunt; Colon cancer (age of onset: 70) in her maternal uncle; Colon cancer (age of onset: 38) in her maternal uncle; Diabetes in her mother and another family  member; Heart disease in her mother and another family member; Hyperlipidemia in her father, mother, and another family member; Hypertension in her father, mother, and another family member.  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   DEGENERATION, MACULAR NOS 03/14/2007   DEPRESSION 03/14/2007   DIABETES MELLITUS, TYPE II 03/14/2007  Diarrhea 05/31/2007   DISC DISEASE, LUMBAR 05/25/2009   DIVERTICULOSIS OF COLON 01/11/2009   HYPERLIPIDEMIA 06/21/2010   HYPERTENSION 03/14/2007   Lung abnormality    LT LUNG WITH SPOT HAS BEEN FOLLOWED X 3 YRS DR BURNEY   SEBORRHEIC KERATOSIS, INFLAMED 12/02/2010   Unspecified hearing loss 01/13/2010   VERTIGO 11/10/2010    Surgical History: Past Surgical History:  Procedure Laterality Date   CHOLECYSTECTOMY  1982   urosespsis post op   JOINT REPLACEMENT  1998   1998 RT KNEE  +2009 REMOVED, REPLACED JOINT   KNEE ARTHROSCOPY Right 1995   right, TNR   left shoulder Left 2012   rotator cuff   SPINE SURGERY  1996   fusion, ruptured disk   TONSILLECTOMY  1952     Medications:   Current Facility-Administered Medications:    acetaminophen  (TYLENOL ) tablet 650 mg, 650 mg, Oral, Q6H PRN **OR** acetaminophen  (TYLENOL ) suppository 650 mg, 650 mg, Rectal, Q6H PRN, Tobie, Vishal R, MD   albuterol  (PROVENTIL ) (2.5 MG/3ML) 0.083% nebulizer solution 3 mL, 3 mL, Inhalation, Q6H PRN, Tobie, Vishal R, MD   bisacodyl  (DULCOLAX) EC tablet 5 mg, 5 mg, Oral, Daily PRN, Patel, Vishal R, MD   citalopram  (CELEXA ) tablet 20 mg, 20 mg, Oral, Daily, Patel, Vishal R, MD, 20 mg at 09/28/24 9161   cyanocobalamin  (VITAMIN B12) tablet 1,000 mcg, 1,000 mcg, Oral, Daily, Krishnan, Gokul, MD, 1,000 mcg at 09/28/24 9162   enoxaparin  (LOVENOX ) injection 40 mg, 40 mg, Subcutaneous, Q24H, Patel, Vishal R, MD, 40 mg at 09/29/24 9060    haloperidol  lactate (HALDOL ) injection 2 mg, 2 mg, Intravenous, Q6H PRN, Akula, Vijaya, MD   insulin  aspart (novoLOG ) injection 0-9 Units, 0-9 Units, Subcutaneous, TID WC, Tobie Jorie SAUNDERS, MD, 2 Units at 09/29/24 9158   OLANZapine  (ZYPREXA ) tablet 2.5 mg, 2.5 mg, Oral, BID, 2.5 mg at 09/28/24 2150 **OR** OLANZapine  (ZYPREXA ) injection 2.5 mg, 2.5 mg, Intramuscular, BID, Lord, Jamison Y, NP   ondansetron  (ZOFRAN ) tablet 4 mg, 4 mg, Oral, Q6H PRN **OR** ondansetron  (ZOFRAN ) injection 4 mg, 4 mg, Intravenous, Q6H PRN, Tobie Jorie SAUNDERS, MD   Oral care mouth rinse, 15 mL, Mouth Rinse, PRN, Akula, Vijaya, MD   oxyCODONE  (Oxy IR/ROXICODONE ) immediate release tablet 2.5 mg, 2.5 mg, Oral, Q6H PRN, Akula, Vijaya, MD, 2.5 mg at 09/22/24 1639   senna-docusate (Senokot-S) tablet 1 tablet, 1 tablet, Oral, QHS PRN, Tobie, Vishal R, MD   sodium chloride  flush (NS) 0.9 % injection 3 mL, 3 mL, Intravenous, Q12H, Tobie Jorie R, MD, 3 mL at 09/26/24 1022  Allergies: Allergies  Allergen Reactions   Diphenhydramine Other (See Comments)    Makes her hyperactive    Morphine And Codeine Itching    Patient states that she can still take this medication it just makes her itch.    Stefanee Mckell L Harrie Cazarez, DO

## 2024-09-30 DIAGNOSIS — F03918 Unspecified dementia, unspecified severity, with other behavioral disturbance: Secondary | ICD-10-CM | POA: Diagnosis not present

## 2024-09-30 LAB — GLUCOSE, CAPILLARY
Glucose-Capillary: 222 mg/dL — ABNORMAL HIGH (ref 70–99)
Glucose-Capillary: 127 mg/dL — ABNORMAL HIGH (ref 70–99)
Glucose-Capillary: 274 mg/dL — ABNORMAL HIGH (ref 70–99)
Glucose-Capillary: 152 mg/dL — ABNORMAL HIGH (ref 70–99)

## 2024-09-30 NOTE — Consult Note (Signed)
 [facility] Psychiatric Consult Initial  Patient Name: .Lynn [name]  MRN: [id number]  DOB: July 23, 1946  Consult Order details:  Orders (From admission, onward)     Start     Ordered   09/27/24 1202  IP CONSULT TO PSYCHIATRY       Ordering Provider: Verdene Purchase, MD  Provider:  (Not yet assigned)  Question Answer Comment  Location [facility]   Reason for Consult? patient with dementia, behaving irrationally, refusing SNF but also doesn't want to leave the hospital. refusing meds.no active medical issue. Please assist with management.      09/27/24 1202             Mode of Visit: In person    Psychiatry Consult Evaluation  Service Date: September 30, 2024 LOS:  LOS: 2 days  Chief Complaint Everybody says I'm crazy.  Primary Psychiatric Diagnoses  Agitation related to dementia/cognitive decline  Assessment  Lynn [name] is a 78 y.o. female admitted: Medicallyfor 09/21/2024  6:10 PM for a fall.  Dr. Tobie wrote, with medical history significant for T2DM, HTN, HLD, CKD stage III, asthma, depression, cognitive impairment/suspected underlying dementia who presented to the ED from home for evaluation after she was found down on her floor.   Patient lives alone.  She has had progressive decline in ability to care for self and memory.  Family states that she is post to ambulate with a walker but she never uses it.  She has not been taking any of her medications other than Ambien .  She often refuses help from family.  She has not been bathing herself.  She has not been able to take care of her finances.  Her cousins have been having to take care of all of these things.  She has not left her house since her last PCP visit.    Family do call her twice a day and check in on her to make sure she is doing okay.  They last spoke to her last night around 6:30 PM.  She did not answer the the phone today therefore her cousin went to her house to check in on  her.  They found her lying on the hardwood floor undressed.  They tried to get her up.  She was refusing to get up and also thought that she was laying in her own bed.  Family stated that she has confusion at baseline but this appeared worse today.   EMS were called.  Patient was refusing to get up with EMS/fire.  I spent an hour and a half trying to assist her.  Per ED triage documentation patient was combative with EMS and she was given 5 mg of Haldol  and she calmed down afterwards.  Her current presentation of confusion, refusing medications, memory issues, aggressive at times is most consistent with agitation related to dementia (per Dr. Darlean notes). Current outpatient psychotropic medications include Celexa  and Seroquel  and historically she has had minimal response to these medications as she is non compliant with medications prior to admission as evidenced by self and family report.   On examination today on 09/30/2024, 2 of the patient's cousins were there in the room.  She was in a darkened room, very irritable and denying any need for help and claiming that she can take care of herself at home.    Diagnoses:  Active Hospital problems: Principal Problem:   Fall at home, initial encounter Active Problems:   T2DM (type 2 diabetes mellitus) (  HCC)   Dyslipidemia associated with T2DM (HCC)   HTN associated with DM (HCC)   Asthma   Chronic kidney disease, stage 3a (HCC)   Agitation due to dementia [facility])    Plan   ## Psychiatric Medication Recommendations:  -Continue Zyprexa  2.5 mg BID oral or IM until mood stabilized and takes oral medications.  On discharge she can return to Seroquel  and discontinue Zyprexa . -Continue Celexa  20 mg daily  ## Medical Decision Making Capacity:  On 09/30/2024 Patient does not have capacity to refuse SNF placement or medical treatment at this time. Continue to reassess capacity daily.   ## Further Work-up:  -- most recent EKG on 11/16 had QtC of  446 and 458, 11/17 was 501, repeat order placed -- Pertinent labwork reviewed earlier this admission includes: CBC with diff, EKG, chem panel, and u/a   ## Disposition:-- There are no psychiatric contraindications to discharge at this time  ## Behavioral / Environmental: - No specific recommendations at this time.     ## Safety and Observation Level:  - Based on my clinical evaluation, I estimate the patient to be at minimal risk of self harm in the current setting. - At this time, we recommend  routine. This decision is based on my review of the chart including patient's history and current presentation, interview of the patient, mental status examination, and consideration of suicide risk including evaluating suicidal ideation, plan, intent, suicidal or self-harm behaviors, risk factors, and protective factors. This judgment is based on our ability to directly address suicide risk, implement suicide prevention strategies, and develop a safety plan while the patient is in the clinical setting. Please contact our team if there is a concern that risk level has changed.  CSSR Risk Category:C-SSRS RISK CATEGORY: No Risk  Suicide Risk Assessment: Patient has following modifiable risk factors for suicide: medication noncompliance, which we are addressing by providing an oral and IM option. Patient has following non-modifiable or demographic risk factors for suicide: none Patient has the following protective factors against suicide: Supportive family, no history of suicide attempts, and no history of NSSIB  Thank you for this consult request. Recommendations have been communicated to the primary team.  We will continue to follow at this time.   PAULETTE BEETS, MD       History of Present Illness  Relevant Aspects of [facility] Course:  Admitted on 09/21/2024 for a fall. They have memory issues that is increasing with confusion and refusal of meds at home and in the hospital.   Patient  Report:  09/28/2024 The client was sleeping prior to the assessment.  I was sleeping until you woke me up.  When this provider introduced herself, she said, Good, I need a few more of you because everyone says I'm crazy.  Irritable throughout most of the assessment yet cooperative.  When asked about her depression, she reported, I don't know and I don't care, no suicidal ideations God no!.  She stated, I don't worry about anything here, seems to have anxiety at home however.  Her sleep is like yours, I guess.  Appetite is fine.  Denies hallucinations, paranoia, homicidal ideations.  Denied substance use and I use to smoke and stopped 40 years ago, cigarettes.  She has been refusing her medications to which she feels she does not need, just need to get out of this place yet reported she has no where to go.  When asked about the SNF, she stated, I'm not going to any  nursing home.  Medications adjusted to assist with her mood which will hopefully improved her compliance.  09/29/2024 Patient seen laying in bed this morning on my approach. She stated she did not need to see psychiatry because she's not crazy. The patient is disoriented to place, time, and situation stating it's winter time, she's in her house, and she did not understand why the treatment team was recommending SNF placement. She feels that she can take care of herself in her home that she is currently in. She denies any SI/HI/AVH.  09/30/2024: The patient was seen and reevaluated today.  On today's examination, the patient's 2 cousins also contributed significantly to the information.  Apparently the patient lives alone and the 2 cousins actually help her with the food and ADLs.  Today she is lying in the room with all the shades drawn in an extremely dark room claiming that she is extremely photophobic and that her eyes hurt.  She is also not oriented to place person or situation and just states that it is the winter and  does not know if it is before Thanksgiving or after.  She continues to reject all suggestions about care and claims that she can go back home and take care of herself and that she will not have any falls.  She has minimal insight and judgment.  However she continues to deny any active SI/HI/AVH. Psych ROS:  Depression: I don't know and I don't care. Anxiety:  Denied Mania (lifetime and current): none Psychosis: (lifetime and current): none  Review of Systems  Psychiatric/Behavioral:  Positive for memory loss. The patient is nervous/anxious.   All other systems reviewed and are negative.    Psychiatric and Social History  Psychiatric History:  Information collected from patient and chart.  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  Exam Findings  Physical Exam: completed by the MD, reviewed. Vital Signs:  Temp:  [97.5 F (36.4 C)-98.1 F (36.7 C)] 98.1 F (36.7 C) (11/24 2149) Pulse Rate:  [74-77] 77 (11/24 2149) Resp:  [16] 16 (11/24 2149) BP: (119-120)/(61-68) 119/61 (11/24 2149) SpO2:  [97 %-98 %] 97 % (11/24 2149) Blood pressure 119/61, pulse 77, temperature 98.1 F (36.7 C), temperature source Oral, resp. rate 16, height 5' 6 (1.676 m), weight 87.2 kg, SpO2 97%. Body mass index is 31.03 kg/m.  Physical Exam  Mental Status Exam: General Appearance: Casual  Orientation:  person and place  Memory:  Immediate;   Fair Recent;   Poor Remote;   Fair  Concentration:  Concentration: Fair and Attention Span: Fair  Recall:  Fair to  poor  Attention  Fair  Eye Contact:  Good  Speech:  Clear and Coherent  Language:  Good  Volume:  Normal  Mood: irritable  Affect:  Congruent  Thought Process:  Coherent  Thought Content:  goal oriented  Suicidal Thoughts:  No  Homicidal Thoughts:  No  Judgement:  Poor  Insight:  Lacking  Psychomotor Activity:  Decreased  Akathisia:  No  Fund of Knowledge:  Fair      Assets:  Housing Leisure Time  Resilience Social Support  Cognition:  Impaired,  Moderate  ADL's:  Impaired  AIMS (if indicated):        Other History   These have been pulled in through the EMR, reviewed, and updated if appropriate.  Family History:  The patient's family history includes Cancer in her mother; Colon cancer (age of onset: 41) in her maternal aunt; Colon cancer (age of onset: 35) in her maternal uncle; Colon cancer (age of onset: 41) in her maternal uncle; Diabetes in her mother and another family member; Heart disease in her mother and another family member; Hyperlipidemia in her father, mother, and another family member; Hypertension in her father, mother, and another family member.  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   DEGENERATION, MACULAR NOS 03/14/2007   DEPRESSION 03/14/2007   DIABETES MELLITUS, TYPE II 03/14/2007   Diarrhea 05/31/2007   DISC DISEASE, LUMBAR 05/25/2009   DIVERTICULOSIS OF COLON 01/11/2009   HYPERLIPIDEMIA 06/21/2010   HYPERTENSION 03/14/2007   Lung abnormality    LT LUNG WITH SPOT HAS BEEN FOLLOWED X 3 YRS DR BURNEY   SEBORRHEIC KERATOSIS, INFLAMED 12/02/2010   Unspecified hearing loss 01/13/2010   VERTIGO 11/10/2010    Surgical History: Past Surgical History:  Procedure Laterality Date   CHOLECYSTECTOMY  1982   urosespsis post op   JOINT REPLACEMENT  1998   1998 RT KNEE  +2009 REMOVED, REPLACED JOINT   KNEE  ARTHROSCOPY Right 1995   right, TNR   left shoulder Left 2012   rotator cuff   SPINE SURGERY  1996   fusion, ruptured disk   TONSILLECTOMY  1952     Medications:   Current Facility-Administered Medications:    acetaminophen  (TYLENOL ) tablet 650 mg, 650 mg, Oral, Q6H PRN **OR** acetaminophen  (TYLENOL ) suppository 650 mg, 650 mg, Rectal, Q6H PRN, Tobie, Vishal R, MD   albuterol  (PROVENTIL ) (2.5 MG/3ML) 0.083% nebulizer solution 3 mL, 3 mL, Inhalation, Q6H PRN, Tobie, Vishal R, MD   bisacodyl  (DULCOLAX) EC tablet 5 mg, 5 mg, Oral, Daily PRN, Tobie Jorie SAUNDERS, MD   citalopram  (CELEXA ) tablet 20 mg, 20 mg, Oral, Daily, Patel, Vishal R, MD, 20 mg at 09/30/24 1035   cyanocobalamin  (VITAMIN B12) tablet 1,000 mcg, 1,000 mcg, Oral, Daily, Krishnan, Gokul, MD, 1,000 mcg at 09/30/24 1034   enoxaparin  (LOVENOX ) injection 40 mg, 40 mg, Subcutaneous, Q24H, Patel, Vishal R, MD, 40 mg at 09/30/24 1034   haloperidol  lactate (HALDOL ) injection 2 mg, 2 mg, Intravenous, Q6H PRN, Akula, Vijaya, MD   insulin  aspart (novoLOG ) injection 0-9 Units, 0-9 Units, Subcutaneous, TID WC, Patel, Vishal R, MD, 3 Units at 09/30/24 0800   OLANZapine  (ZYPREXA ) tablet 2.5 mg, 2.5 mg, Oral, BID, 2.5 mg at 09/30/24 1034 **OR** OLANZapine  (ZYPREXA ) injection 2.5 mg, 2.5 mg, Intramuscular, BID, Lord, Jamison Y, NP   ondansetron  (ZOFRAN ) tablet 4 mg, 4 mg, Oral, Q6H PRN **OR** ondansetron  (ZOFRAN ) injection 4 mg, 4 mg, Intravenous, Q6H PRN, Tobie Jorie SAUNDERS, MD   Oral care mouth rinse, 15 mL, Mouth Rinse, PRN, Akula, Vijaya, MD   oxyCODONE  (Oxy IR/ROXICODONE ) immediate release tablet 2.5 mg, 2.5 mg, Oral, Q6H PRN, Akula, Vijaya, MD, 2.5 mg at 09/22/24 1639   senna-docusate (Senokot-S) tablet 1 tablet, 1 tablet, Oral, QHS PRN, Tobie Jorie R, MD   sodium chloride  flush (NS) 0.9 %  injection 3 mL, 3 mL, Intravenous, Q12H, Tobie Bloch R, MD, 3 mL at 09/26/24 1022  Allergies: Allergies  Allergen Reactions   Diphenhydramine Other  (See Comments)    Makes her hyperactive    Morphine And Codeine Itching    Patient states that she can still take this medication it just makes her itch.    PAULETTE BEETS, MD

## 2024-09-30 NOTE — Plan of Care (Signed)
   Problem: Safety: Goal: Ability to remain free from injury will improve Outcome: Progressing

## 2024-09-30 NOTE — Progress Notes (Signed)
 [facility]                                                                               Lynn [name], is a 78 y.o. female, DOB - Jul 17, 1946, FMW:[id number] Admit date - 09/21/2024    Outpatient Primary MD for the patient is Lynn Lauraine BRAVO, NP   Brief summary   Lynn [name] is a 78 y.o. female with medical history significant for T2DM, HTN, HLD, CKD stage III, asthma, depression, cognitive impairment/suspected underlying dementia who is admitted after unwitnessed fall at home with prolonged downtime. She was admitted for mild rhabdomyolysis  Assessment & Plan   Dementia with behavioral disturbance  With intermittent agitation.  At times patient is lucid but at other times she is extremely uncooperative.  She refused to go to SNF on Friday.  She was started on Seroquel  but she refused to take it.  Patient continues to have episodes of agitation and belligerent behavior.  She seems to be irrational.  Does not have any insight into her medical issues.  Does not appear to have medical capacity. Psychiatry was consulted to assist with management.  They have started the patient on olanzapine .  Celexa  is being continued.   Waiting on improvement in her behavior.  Seems to be a little bit more cooperative this morning.    May need to [facility] psychiatry if there is no change in the next 24 hours.  Essential hypertension Patient was on amlodipine  and losartan  but she had hypotension on 11/23 so these medications were held.  Blood pressure seems to be reasonably well-controlled off of antihypertensives.  Continue to monitor.      Unwitnessed fall at home Mild rhabdomyolysis CK on admission at 321 improved to 159 with IV fluids.  Mildly elevated troponins Patient currently denies any chest pain or shortness of breath EKG shows sinus rhythm with mild ST elevation in the lateral leads.  Echocardiogram shows normal LVEF without any wall motion abnormalities. Seen by  cardiology.  No plans for further cardiac testing at this time.  Type 2 DM Continue with SSI.  HbA1c 6.6. CBGs are reasonably well-controlled.  Depression Continue Celexa .  See above  Hyperlipidemia On statin at home.   Stage 3a ckd Creatinine at baseline.   Mild Leukocytosis.  Likely reactive.  Resolved.  Goals of care Seen by palliative care.  Now DNR.  Code Status: DNR.  DVT Prophylaxis:  enoxaparin  (LOVENOX ) injection 40 mg Start: 09/22/24 2200 Family Communication: No family at bedside today Disposition: SNF but patient refused to go.  Procedures:  None  Consultants:   Palliative care consult Cardiology.  Psychiatry   Medications  Scheduled Meds:  citalopram   20 mg Oral Daily   vitamin B-12  1,000 mcg Oral Daily   enoxaparin  (LOVENOX ) injection  40 mg Subcutaneous Q24H   insulin  aspart  0-9 Units Subcutaneous TID WC   OLANZapine   2.5 mg Oral BID   Or   OLANZapine   2.5 mg Intramuscular BID   sodium chloride  flush  3 mL Intravenous Q12H   Continuous Infusions: PRN Meds:.acetaminophen  **OR** acetaminophen , albuterol , bisacodyl , haloperidol  lactate, ondansetron  **OR** ondansetron  (ZOFRAN ) IV, mouth rinse, oxyCODONE , senna-docusate  Subjective:   Patient a little bit more cooperative this morning.  Denies any complaints though.  Objective:   Vitals:   09/29/24 0457 09/29/24 1007 09/29/24 1800 09/29/24 2149  BP: 117/65 (!) 157/94 120/68 119/61  Pulse: 67 79 74 77  Resp: 18 16 16 16   Temp: (!) 97.4 F (36.3 C) 98.2 F (36.8 C) (!) 97.5 F (36.4 C) 98.1 F (36.7 C)  TempSrc: Oral Oral Oral Oral  SpO2: 93% 96% 98% 97%  Weight:      Height:        Intake/Output Summary (Last 24 hours) at 09/30/2024 9062 Last data filed at 09/29/2024 2000 Gross per 24 hour  Intake --  Output 300 ml  Net -300 ml    Filed Weights   09/23/24 1816  Weight: 87.2 kg   She was willing to be examined this morning. General appearance: Awake alert.  In no  distress Resp: Clear to auscultation bilaterally.  Normal effort Cardio: S1-S2 is normal regular.  No S3-S4.  No rubs murmurs or bruit GI: Abdomen is soft.  Nontender nondistended.  Bowel sounds are present normal.  No masses organomegaly Extremities: No edema.  Physical deconditioning noted. No obvious focal neurological deficits.   Data Reviewed:       Latest Ref Rng & Units 09/28/2024    6:15 AM 09/25/2024    4:47 AM 09/24/2024    5:14 AM  CBC  WBC 4.0 - 10.5 K/uL 7.6  7.1  6.9   Hemoglobin 12.0 - 15.0 g/dL 84.3  83.8  83.8   Hematocrit 36.0 - 46.0 % 47.7  48.1  47.7   Platelets 150 - 400 K/uL 193  194  185       Latest Ref Rng & Units 09/28/2024    6:15 AM 09/25/2024    4:47 AM 09/24/2024    5:14 AM  CMP  Glucose 70 - 99 mg/dL 96  872  880   BUN 8 - 23 mg/dL 23  17  14    Creatinine 0.44 - 1.00 mg/dL 8.88  8.98  8.98   Sodium 135 - 145 mmol/L 136  134  137   Potassium 3.5 - 5.1 mmol/L 3.8  3.8  3.3   Chloride 98 - 111 mmol/L 102  99  101   CO2 22 - 32 mmol/L 31  21  22    Calcium  8.9 - 10.3 mg/dL 9.5  9.0  9.0   Total Protein 6.5 - 8.1 g/dL 5.0     Total Bilirubin 0.0 - 1.2 mg/dL 1.0     Alkaline Phos 38 - 126 U/L 68     AST 15 - 41 U/L 15     ALT 0 - 44 U/L 11      CBG (last 3)  Recent Labs    09/29/24 1650 09/29/24 2147 09/30/24 0848  GLUCAP 149* 147* 222*     Radiology Studies: No results found.   Lynn [name] M.D. [facility] 09/30/2024, 9:37 AM  Available via Epic secure chat 7am-7pm After 7 pm, please refer to night coverage provider listed on amion.

## 2024-09-30 NOTE — Progress Notes (Signed)
 Physical Therapy Treatment Patient Details Name: Lynn [name] MRN: [id number] DOB: [birth year] Today's Date: 09/30/2024   History of Present Illness Patient is a 78 yo female presenting to the ED with AMS, found down for an unknown period of time on 09/21/24. Head CT clear. PMH includes:  T2DM, HTN, HLD, CKD stage III, asthma, depression, cognitive impairment/suspected underlying dementia    PT Comments  PT called into room by pt visitor as pt had slid down in recliner and family fearful of leaving her alone at this time. Pt requires significant assistance to transfer, unable to functionally utilize RLE in sit to stand or pivot transfers due to lack of knee flexion ROM as well as due to reports of pain. Pt has poor awareness of deficits, placing her at a high risk for falls. Patient will benefit from continued inpatient follow up therapy, <3 hours/day.    If plan is discharge home, recommend the following: A lot of help with walking and/or transfers;A lot of help with bathing/dressing/bathroom;Assistance with cooking/housework;Direct supervision/assist for medications management;Direct supervision/assist for financial management;Assist for transportation;Help with stairs or ramp for entrance;Supervision due to cognitive status   Can travel by private vehicle     No  Equipment Recommendations  Hospital bed;Hoyer lift;Wheelchair (measurements PT);Wheelchair cushion (measurements PT)    Recommendations for Other Services       Precautions / Restrictions Precautions Precautions: Fall Recall of Precautions/Restrictions: Impaired Restrictions Weight Bearing Restrictions Per Provider Order: No     Mobility  Bed Mobility Overal bed mobility: Needs Assistance Bed Mobility: Sit to Supine       Sit to supine: Max assist, +2 for physical assistance   General bed mobility comments: assistance of PT and visitor    Transfers Overall transfer level: Needs assistance Equipment used: 1  person hand held assist Transfers: Bed to chair/wheelchair/BSC       Squat pivot transfers: Max assist     General transfer comment: pt unable to bend R knee, requires significant assistance to squat pivot. Increased time as pt often refuses PT assistance and initiallly attempts to laterally scoot but is not strong enough to safely perform unassisted    Ambulation/Gait                   Stairs             Wheelchair Mobility     Tilt Bed    Modified Rankin (Stroke Patients Only)       Balance Overall balance assessment: Needs assistance Sitting-balance support: No upper extremity supported, Feet supported Sitting balance-Leahy Scale: Fair     Standing balance support:  (pt did not stand during session)                                Communication Communication Communication: Impaired Factors Affecting Communication: Difficulty expressing self  Cognition Arousal: Alert Behavior During Therapy: Agitated   PT - Cognitive impairments: Orientation, Awareness, Memory, Attention, Initiation, Sequencing, Problem solving, Safety/Judgement   Orientation impairments: Time, Situation                     Following commands: Impaired Following commands impaired: Follows one step commands inconsistently    Cueing Cueing Techniques: Verbal cues, Tactile cues, Visual cues  Exercises      General Comments General comments (skin integrity, edema, etc.): VSS on RA      Pertinent Vitals/Pain Pain Assessment Pain Assessment:  Faces Faces Pain Scale: Hurts whole lot Pain Location: RLE Pain Descriptors / Indicators: Aching Pain Intervention(s): Monitored during session    Home Living                          Prior Function            PT Goals (current goals can now be found in the care plan section) Acute Rehab PT Goals Patient Stated Goal: didn't state Progress towards PT goals: Progressing toward goals (slow  progress)    Frequency    Min 2X/week      PT Plan      Co-evaluation              AM-PAC PT 6 Clicks Mobility   Outcome Measure  Help needed turning from your back to your side while in a flat bed without using bedrails?: A Lot Help needed moving from lying on your back to sitting on the side of a flat bed without using bedrails?: A Lot Help needed moving to and from a bed to a chair (including a wheelchair)?: A Lot Help needed standing up from a chair using your arms (e.g., wheelchair or bedside chair)?: Total Help needed to walk in hospital room?: Total Help needed climbing 3-5 steps with a railing? : Total 6 Click Score: 9    End of Session Equipment Utilized During Treatment: Gait belt Activity Tolerance: Treatment limited secondary to agitation Patient left: in bed;with call bell/phone within reach;with bed alarm set Nurse Communication: Mobility status;Need for lift equipment PT Visit Diagnosis: History of falling (Z91.81)     Time: 8698-8685 PT Time Calculation (min) (ACUTE ONLY): 13 min  Charges:    $Therapeutic Activity: 8-22 mins PT General Charges $$ ACUTE PT VISIT: 1 Visit                     Lynn [name], PT, DPT Acute Rehabilitation Office [phone]    Lynn [name] 09/30/2024, 1:19 PM

## 2024-09-30 NOTE — Progress Notes (Signed)
 Physical Therapy Treatment Patient Details Name: Lynn [name] MRN: [id number] DOB: 1946/07/05 Today's Date: 09/30/2024   History of Present Illness Patient is a 78 yo female presenting to the ED with AMS, found down for an unknown period of time on 09/21/24. Head CT clear. PMH includes:  T2DM, HTN, HLD, CKD stage III, asthma, depression, cognitive impairment/suspected underlying dementia    PT Comments  Pt with request to have BM upon PT arrival. Pt wanting to get up but not wanting therapist to help her but also reaching to hold onto therapist. Pt confused with impulsivity, decreased safety awareness, decreased problem solving, decreased insight to deficits, and is very easily irritated. Pt requiring maxAx2 for std pvt to [facility] and then second stand to transfer to chair.  Pt with poor hygiene and refusing to perform peri-care s/p urination stating If I didn't poop then I don't need to clean.   Pt unsafe to return home alone as pt unable to safely care for herself. Pt to continue to benefit from inpatient rehab program < 3 hrs a day to address above deficits. Acute PT to cont to follow.   If plan is discharge home, recommend the following: A lot of help with walking and/or transfers;A lot of help with bathing/dressing/bathroom;Assistance with cooking/housework;Direct supervision/assist for medications management;Direct supervision/assist for financial management;Assist for transportation;Help with stairs or ramp for entrance;Supervision due to cognitive status   Can travel by private vehicle     No  Equipment Recommendations  Hospital bed;Hoyer lift;Wheelchair (measurements PT);Wheelchair cushion (measurements PT)    Recommendations for Other Services       Precautions / Restrictions Precautions Precautions: Fall Recall of Precautions/Restrictions: Impaired Restrictions Weight Bearing Restrictions Per Provider Order: No     Mobility  Bed Mobility Overal bed mobility: Needs  Assistance Bed Mobility: Supine to Sit     Supine to sit: Mod assist, HOB elevated, +2 for physical assistance     General bed mobility comments: pt asking for help but then telling PT and PT tech to not touch her, modA for LE management off EOB and for trunk elevation    Transfers Overall transfer level: Needs assistance Equipment used: 2 person hand held assist (face to face transfer with gait belt) Transfers: Bed to chair/wheelchair/BSC       Squat pivot transfers: Max assist, +2 physical assistance     General transfer comment: pt unable to bend R knee, requires significant assistance to squat pivot. Pt unable to stand fully upright, pt with fear of falling and not wanting therapists to assist her but then also holding onto PT and tech and not letting go, pt with difficulty stepping    Ambulation/Gait               General Gait Details: unable this date   Stairs             Wheelchair Mobility     Tilt Bed    Modified Rankin (Stroke Patients Only)       Balance Overall balance assessment: Needs assistance Sitting-balance support: No upper extremity supported, Feet supported Sitting balance-Leahy Scale: Fair     Standing balance support: Bilateral upper extremity supported, During functional activity, Reliant on assistive device for balance (pt did not stand during session) Standing balance-Leahy Scale: Zero Standing balance comment: reliant on external support                            Communication Communication  Communication: Impaired Factors Affecting Communication: Difficulty expressing self  Cognition Arousal: Alert Behavior During Therapy: Agitated   PT - Cognitive impairments: Orientation, Awareness, Memory, Attention, Initiation, Sequencing, Problem solving, Safety/Judgement   Orientation impairments: Time, Situation                   PT - Cognition Comments: pt very ornery with impulsivity, decreased insight to  safety, decreased insight to safety, argumentative Following commands: Impaired Following commands impaired: Follows one step commands inconsistently    Cueing Cueing Techniques: Verbal cues, Tactile cues, Visual cues  Exercises      General Comments General comments (skin integrity, edema, etc.): VSS on RA      Pertinent Vitals/Pain Pain Assessment Pain Assessment: Faces Faces Pain Scale: Hurts whole lot Pain Location: RLE Pain Descriptors / Indicators: Aching Pain Intervention(s): Monitored during session, Repositioned (placed pillows under LEs on BSC and in recliner)    Home Living                          Prior Function            PT Goals (current goals can now be found in the care plan section) Acute Rehab PT Goals Patient Stated Goal: didn't state PT Goal Formulation: With patient/family Time For Goal Achievement: 10/06/24 Potential to Achieve Goals: Fair Progress towards PT goals: Progressing toward goals    Frequency    Min 2X/week      PT Plan      Co-evaluation              AM-PAC PT 6 Clicks Mobility   Outcome Measure  Help needed turning from your back to your side while in a flat bed without using bedrails?: A Lot Help needed moving from lying on your back to sitting on the side of a flat bed without using bedrails?: A Lot Help needed moving to and from a bed to a chair (including a wheelchair)?: A Lot Help needed standing up from a chair using your arms (e.g., wheelchair or bedside chair)?: Total Help needed to walk in hospital room?: Total Help needed climbing 3-5 steps with a railing? : Total 6 Click Score: 9    End of Session Equipment Utilized During Treatment: Gait belt Activity Tolerance: Treatment limited secondary to agitation Patient left: with call bell/phone within reach;in chair;with chair alarm set;with family/visitor present Nurse Communication: Mobility status;Need for lift equipment PT Visit Diagnosis:  History of falling (Z91.81)     Time: 8889-8865 PT Time Calculation (min) (ACUTE ONLY): 24 min  Charges:    $Therapeutic Activity: 23-37 mins PT General Charges $$ ACUTE PT VISIT: 1 Visit                     Norene Ames, PT, DPT Acute Rehabilitation Services Secure chat preferred Office #: [phone]    Norene CHRISTELLA Ames 09/30/2024, 1:28 PM

## 2024-10-01 DIAGNOSIS — Y92009 Unspecified place in unspecified non-institutional (private) residence as the place of occurrence of the external cause: Secondary | ICD-10-CM | POA: Diagnosis not present

## 2024-10-01 DIAGNOSIS — R4182 Altered mental status, unspecified: Secondary | ICD-10-CM | POA: Diagnosis not present

## 2024-10-01 DIAGNOSIS — W19XXXA Unspecified fall, initial encounter: Secondary | ICD-10-CM | POA: Diagnosis not present

## 2024-10-01 LAB — COMPREHENSIVE METABOLIC PANEL WITH GFR
ALT: 13 U/L (ref 0–44)
AST: 17 U/L (ref 15–41)
Albumin: 2.6 g/dL — ABNORMAL LOW (ref 3.5–5.0)
Alkaline Phosphatase: 69 U/L (ref 38–126)
Anion gap: 7 (ref 5–15)
BUN: 22 mg/dL (ref 8–23)
CO2: 26 mmol/L (ref 22–32)
Calcium: 9.3 mg/dL (ref 8.9–10.3)
Chloride: 102 mmol/L (ref 98–111)
Creatinine, Ser: 1.14 mg/dL — ABNORMAL HIGH (ref 0.44–1.00)
GFR, Estimated: 49 mL/min — ABNORMAL LOW (ref >60)
Glucose, Bld: 134 mg/dL — ABNORMAL HIGH (ref 70–99)
Potassium: 4.3 mmol/L (ref 3.5–5.1)
Sodium: 135 mmol/L (ref 135–145)
Total Bilirubin: 0.6 mg/dL (ref 0.0–1.2)
Total Protein: 5.1 g/dL — ABNORMAL LOW (ref 6.5–8.1)

## 2024-10-01 LAB — PHOSPHORUS: Phosphorus: 2.7 mg/dL (ref 2.5–4.6)

## 2024-10-01 LAB — GLUCOSE, CAPILLARY
Glucose-Capillary: 130 mg/dL — ABNORMAL HIGH (ref 70–99)
Glucose-Capillary: 150 mg/dL — ABNORMAL HIGH (ref 70–99)
Glucose-Capillary: 156 mg/dL — ABNORMAL HIGH (ref 70–99)
Glucose-Capillary: 146 mg/dL — ABNORMAL HIGH (ref 70–99)

## 2024-10-01 LAB — MAGNESIUM: Magnesium: 2.1 mg/dL (ref 1.7–2.4)

## 2024-10-01 LAB — CBC
HCT: 46.4 % — ABNORMAL HIGH (ref 36.0–46.0)
Hemoglobin: 15.1 g/dL — ABNORMAL HIGH (ref 12.0–15.0)
MCH: 29.3 pg (ref 26.0–34.0)
MCHC: 32.5 g/dL (ref 30.0–36.0)
MCV: 89.9 fL (ref 80.0–100.0)
Platelets: 198 K/uL (ref 150–400)
RBC: 5.16 MIL/uL — ABNORMAL HIGH (ref 3.87–5.11)
RDW: 13.4 % (ref 11.5–15.5)
WBC: 6.9 K/uL (ref 4.0–10.5)
nRBC: 0 % (ref 0.0–0.2)

## 2024-10-01 NOTE — Progress Notes (Signed)
 PROGRESS NOTE  Lynn [name]  DOB: [birth year]  PCP: Elnor Lauraine BRAVO, NP FMW:[id number]  DOA: 09/21/2024  LOS: 3 days  Hospital Day: 11  Subjective: Patient was seen and examined this morning. Elderly Caucasian female.  Alert, awake, mumbling.  Oriented to place only. Not interested in getting conversation.  Brief narrative: Lynn [name] is a 78 y.o. female with PMH significant for DM2, HTN, HLD, CKD, asthma, depression, cognitive impairment/suspected underlying dementia  11/16, patient was brought to the ED from home for altered mental status, found on the floor after an unwitnessed fall at  with prolonged downtime. CK level was elevated. CT head unremarkable for acute abnormality Admitted to [facility] for fall, rhabdomyolysis  Seen by PT, recommended SNF 11/21, patient was prepared for discharge to SNF but when EMS came to pick her up, patient refused to go.  Given her behaviors issues, psychiatry was consulted  Assessment and plan: Unwitnessed fall at home Mild rhabdomyolysis CK on admission at 321 improved to 159 with IV fluids. Currently not on IV hydration   Dementia with behavioral disturbance  H/o depression 11/21 she refused to go to SNF.  Psychiatry was consulted.   Started on olanzapine .  Celexa  continued Continues to have uncooperative behavior with intermittent agitation.  Not interested in having a conversation today. Seen by psychiatry again today.  No changes in medications recommended today.   Essential hypertension Patient was on amlodipine  and losartan  but she had hypotension on 11/23 so these medications were held.  Blood pressure seems to be reasonably well-controlled off of antihypertensives.  Continue to monitor.        Mildly elevated troponins Patient denies any chest pain or shortness of breath EKG shows sinus rhythm with mild ST elevation in the lateral leads.  Echocardiogram shows normal LVEF without any wall motion abnormalities. Seen by  cardiology.  No plans for further cardiac testing at this time.   Type 2 DM Continue with SSI.  HbA1c 6.6. CBGs are reasonably well-controlled.   Hyperlipidemia On statin at home.    Stage 3a ckd Creatinine at baseline.  Recent Labs    03/28/24 1551 09/21/24 1819 09/21/24 1834 09/22/24 1124 09/24/24 0514 09/25/24 0447 09/28/24 0615 10/01/24 0433  BUN 14 17 22 18 14 17 23 22   CREATININE 0.97 1.18* 1.00 1.15* 1.01* 1.01* 1.11* 1.14*  CO2 28 23  --  22 22 21* 31 26    Goals of care Seen by palliative care.  Now DNR.   Mobility:   PT Orders: Active   PT Follow up Rec: Skilled Nursing-Short Term Rehab (<3 Hours/Day)09/30/2024 1321    Goals of care   Code Status: Limited: Do not attempt resuscitation (DNR) -DNR-LIMITED -Do Not Intubate/DNI      DVT prophylaxis: enoxaparin  (LOVENOX ) injection 40 mg Start: 09/22/24 2200   Antimicrobials: None Fluid: None Consultants: Psychiatry Family Communication: None at bedside  Status: Inpatient Level of care:  Med-Surg   Patient is from: Home Needs to continue in-hospital care: SNF after behavioral stabilization   Diet:  Diet Order             Diet general           Diet regular Room service appropriate? Yes; Fluid consistency: Thin  Diet effective now                   Scheduled Meds:  citalopram   20 mg Oral Daily   vitamin B-12  1,000 mcg Oral Daily   enoxaparin  (  LOVENOX ) injection  40 mg Subcutaneous Q24H   insulin  aspart  0-9 Units Subcutaneous TID WC   OLANZapine   2.5 mg Oral BID   Or   OLANZapine   2.5 mg Intramuscular BID   sodium chloride  flush  3 mL Intravenous Q12H    PRN meds: acetaminophen  **OR** acetaminophen , albuterol , bisacodyl , haloperidol  lactate, ondansetron  **OR** ondansetron  (ZOFRAN ) IV, mouth rinse, oxyCODONE , senna-docusate   Infusions:    Antimicrobials: Anti-infectives (From admission, onward)    None       Objective: Vitals:   10/01/24 0532 10/01/24 1026  BP: (!)  169/78 128/81  Pulse: 86 84  Resp: 16 16  Temp: 97.7 F (36.5 C) 97.9 F (36.6 C)  SpO2: 98% 95%   No intake or output data in the 24 hours ending 10/01/24 1517  Filed Weights   09/23/24 1816  Weight: 87.2 kg   Weight change:  Body mass index is 31.03 kg/m.   Physical Exam: General exam: Pleasant, elderly Caucasian female.  Not in distress Skin: No rashes, lesions or ulcers. HEENT: Atraumatic, normocephalic, no obvious bleeding Lungs: Clear to auscultation bilaterally,  CVS: S1, S2, no murmur,   GI/Abd: Soft, nontender, nondistended, bowel sound present,   CNS: Alert, awake, mumbling, oriented to place only. Psychiatry: She is not cooperative with exam Extremities: No pedal edema, no calf tenderness,   Data Review: I have personally reviewed the laboratory data and studies available.  F/u labs ordered Unresulted Labs (From admission, onward)    None       Signed, Chapman Rota, MD [facility] 10/01/2024

## 2024-10-01 NOTE — Consult Note (Signed)
 [facility] Psychiatric Consult Initial  Patient Name: .Lynn [name]  MRN: [id number]  DOB: [birth year]  Consult Order details:  Orders (From admission, onward)     Start     Ordered   09/27/24 1202  IP CONSULT TO PSYCHIATRY       Ordering Provider: Verdene Purchase, MD  Provider:  (Not yet assigned)  Question Answer Comment  Location [facility]   Reason for Consult? patient with dementia, behaving irrationally, refusing SNF but also doesn't want to leave the hospital. refusing meds.no active medical issue. Please assist with management.      09/27/24 1202             Mode of Visit: In person    Psychiatry Consult Evaluation  Service Date: October 01, 2024 LOS:  LOS: 3 days  Chief Complaint Everybody says I'm crazy.  Primary Psychiatric Diagnoses  Agitation related to dementia/cognitive decline  Assessment  Lynn [name] is a 78 y.o. female admitted: Medicallyfor 09/21/2024  6:10 PM for a fall.  Dr. Tobie wrote, with medical history significant for T2DM, HTN, HLD, CKD stage III, asthma, depression, cognitive impairment/suspected underlying dementia who presented to the ED from home for evaluation after she was found down on her floor.   Patient lives alone.  She has had progressive decline in ability to care for self and memory.  Family states that she is post to ambulate with a walker but she never uses it.  She has not been taking any of her medications other than Ambien .  She often refuses help from family.  She has not been bathing herself.  She has not been able to take care of her finances.  Her cousins have been having to take care of all of these things.  She has not left her house since her last PCP visit.    Family do call her twice a day and check in on her to make sure she is doing okay.  They last spoke to her last night around 6:30 PM.  She did not answer the the phone today therefore her cousin went to her house to check in on  her.  They found her lying on the hardwood floor undressed.  They tried to get her up.  She was refusing to get up and also thought that she was laying in her own bed.  Family stated that she has confusion at baseline but this appeared worse today.   EMS were called.  Patient was refusing to get up with EMS/fire.  I spent an hour and a half trying to assist her.  Per ED triage documentation patient was combative with EMS and she was given 5 mg of Haldol  and she calmed down afterwards.  Her current presentation of confusion, refusing medications, memory issues, aggressive at times is most consistent with agitation related to dementia (per Dr. Darlean notes). Current outpatient psychotropic medications include Celexa  and Seroquel  and historically she has had minimal response to these medications as she is non compliant with medications prior to admission as evidenced by self and family report.   On examination today on 10/01/2024, the patient was laying in bed and seemed to be calm.  She still has significant cognitive impairment but and does not appear agitated.  She denies depression today.  Although she denies confusion she is unaware that she is in the hospital she thought she was in home health facility.  No changes recommended in the medications. Diagnoses:  Active Hospital  problems: Principal Problem:   Fall at home, initial encounter Active Problems:   T2DM (type 2 diabetes mellitus) (HCC)   Dyslipidemia associated with T2DM (HCC)   HTN associated with DM (HCC)   Asthma   Chronic kidney disease, stage 3a (HCC)   Agitation due to dementia [facility])    Plan   ## Psychiatric Medication Recommendations:  -Continue Zyprexa  2.5 mg BID oral or IM until mood stabilized and takes oral medications.  On discharge she can return to Seroquel  and discontinue Zyprexa . -Continue Celexa  20 mg daily  ## Medical Decision Making Capacity:  On 09/30/2024 Patient does not have capacity to refuse SNF  placement or medical treatment at this time. Continue to reassess capacity daily.   ## Further Work-up:  -- most recent EKG on 11/16 had QtC of 446 and 458, 11/17 was 501, repeat order placed -- Pertinent labwork reviewed earlier this admission includes: CBC with diff, EKG, chem panel, and u/a   ## Disposition:-- There are no psychiatric contraindications to discharge at this time  ## Behavioral / Environmental: - No specific recommendations at this time.     ## Safety and Observation Level:  - Based on my clinical evaluation, I estimate the patient to be at minimal risk of self harm in the current setting. - At this time, we recommend  routine. This decision is based on my review of the chart including patient's history and current presentation, interview of the patient, mental status examination, and consideration of suicide risk including evaluating suicidal ideation, plan, intent, suicidal or self-harm behaviors, risk factors, and protective factors. This judgment is based on our ability to directly address suicide risk, implement suicide prevention strategies, and develop a safety plan while the patient is in the clinical setting. Please contact our team if there is a concern that risk level has changed.  CSSR Risk Category:C-SSRS RISK CATEGORY: No Risk  Suicide Risk Assessment: Patient has following modifiable risk factors for suicide: medication noncompliance, which we are addressing by providing an oral and IM option. Patient has following non-modifiable or demographic risk factors for suicide: none Patient has the following protective factors against suicide: Supportive family, no history of suicide attempts, and no history of NSSIB  Thank you for this consult request. Recommendations have been communicated to the primary team.  We will continue to follow at this time.   PAULETTE BEETS, MD       History of Present Illness  Relevant Aspects of [facility] Course:   Admitted on 09/21/2024 for a fall. They have memory issues that is increasing with confusion and refusal of meds at home and in the hospital.   Patient Report:  09/28/2024 The client was sleeping prior to the assessment.  I was sleeping until you woke me up.  When this provider introduced herself, she said, Good, I need a few more of you because everyone says I'm crazy.  Irritable throughout most of the assessment yet cooperative.  When asked about her depression, she reported, I don't know and I don't care, no suicidal ideations God no!.  She stated, I don't worry about anything here, seems to have anxiety at home however.  Her sleep is like yours, I guess.  Appetite is fine.  Denies hallucinations, paranoia, homicidal ideations.  Denied substance use and I use to smoke and stopped 40 years ago, cigarettes.  She has been refusing her medications to which she feels she does not need, just need to get out of this place yet reported  she has no where to go.  When asked about the SNF, she stated, I'm not going to any nursing home.  Medications adjusted to assist with her mood which will hopefully improved her compliance.  09/29/2024 Patient seen laying in bed this morning on my approach. She stated she did not need to see psychiatry because she's not crazy. The patient is disoriented to place, time, and situation stating it's winter time, she's in her house, and she did not understand why the treatment team was recommending SNF placement. She feels that she can take care of herself in her home that she is currently in. She denies any SI/HI/AVH.  09/30/2024: The patient was seen and reevaluated today.  On today's examination, the patient's 2 cousins also contributed significantly to the information.  Apparently the patient lives alone and the 2 cousins actually help her with the food and ADLs.  Today she is lying in the room with all the shades drawn in an extremely dark room claiming  that she is extremely photophobic and that her eyes hurt.  She is also not oriented to place person or situation and just states that it is the winter and does not know if it is before Thanksgiving or after.  She continues to reject all suggestions about care and claims that she can go back home and take care of herself and that she will not have any falls.  She has minimal insight and judgment.  However she continues to deny any active SI/HI/AVH.  10/01/2024: The patient is lying in bed and seems fairly cooperative.  She is alert, oriented to person but not to place or date.  Her speech is of low volume with some latency and occasional irritability.  She denies any depression and denies any active SI/HI/AVH.  She is a lot more conducive to following up with the doctors recommendations and may be going for rehab or to ALF. Psych ROS:  Depression: I don't know and I don't care. Anxiety:  Denied Mania (lifetime and current): none Psychosis: (lifetime and current): none  Review of Systems  Psychiatric/Behavioral:  Positive for memory loss. The patient is nervous/anxious.   All other systems reviewed and are negative.    Psychiatric and Social History  Psychiatric History:  Information collected from patient and chart.  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  Exam Findings  Physical Exam: completed by the MD, reviewed. Vital Signs:  Temp:  [97.7 F (36.5 C)-98.5 F (36.9 C)] 97.9 F (36.6 C) (11/26 1026) Pulse Rate:  [72-86] 84 (11/26  1026) Resp:  [16-20] 16 (11/26 1026) BP: (108-169)/(61-81) 128/81 (11/26 1026) SpO2:  [95 %-98 %] 95 % (11/26 1026) Blood pressure 128/81, pulse 84, temperature 97.9 F (36.6 C), resp. rate 16, height 5' 6 (1.676 m), weight 87.2 kg, SpO2 95%. Body mass index is 31.03 kg/m.  Physical Exam  Mental Status Exam: General Appearance: Casual  Orientation:  person and place  Memory:  Immediate;   Fair Recent;   Poor Remote;   Fair  Concentration:  Concentration: Fair and Attention Span: Fair  Recall:  Fair to poor  Attention  Fair  Eye Contact:  Good  Speech:  Clear and Coherent  Language:  Good  Volume:  Normal  Mood: irritable  Affect:  Congruent  Thought Process:  Coherent  Thought Content:  goal oriented  Suicidal Thoughts:  No  Homicidal Thoughts:  No  Judgement:  Poor  Insight:  Lacking  Psychomotor Activity:  Decreased  Akathisia:  No  Fund of Knowledge:  Fair      Assets:  Housing Leisure Time Resilience Social Support  Cognition:  Impaired,  Moderate  ADL's:  Impaired  AIMS (if indicated):        Other History   These have been pulled in through the EMR, reviewed, and updated if appropriate.  Family History:  The patient's family history includes Cancer in her mother; Colon cancer (age of onset: 69) in her maternal aunt; Colon cancer (age of onset: 17) in her maternal uncle; Colon cancer (age of onset: 2) in her maternal uncle; Diabetes in her mother and another family member; Heart disease in her mother and another family member; Hyperlipidemia in her father, mother, and another family member; Hypertension in her father, mother, and another family member.  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   DEGENERATION, MACULAR NOS 03/14/2007   DEPRESSION 03/14/2007   DIABETES MELLITUS, TYPE II 03/14/2007    Diarrhea 05/31/2007   DISC DISEASE, LUMBAR 05/25/2009   DIVERTICULOSIS OF COLON 01/11/2009   HYPERLIPIDEMIA 06/21/2010   HYPERTENSION 03/14/2007   Lung abnormality    LT LUNG WITH SPOT HAS BEEN FOLLOWED X 3 YRS DR BURNEY   SEBORRHEIC KERATOSIS, INFLAMED 12/02/2010   Unspecified hearing loss 01/13/2010   VERTIGO 11/10/2010    Surgical History: Past Surgical History:  Procedure Laterality Date   CHOLECYSTECTOMY  1982   urosespsis post op   JOINT REPLACEMENT  1998   1998 RT KNEE  +2009 REMOVED, REPLACED JOINT   KNEE ARTHROSCOPY Right 1995   right, TNR   left shoulder Left 2012   rotator cuff   SPINE SURGERY  1996   fusion, ruptured disk   TONSILLECTOMY  1952     Medications:   Current Facility-Administered Medications:    acetaminophen  (TYLENOL ) tablet 650 mg, 650 mg, Oral, Q6H PRN **OR** acetaminophen  (TYLENOL ) suppository 650 mg, 650 mg, Rectal, Q6H PRN, Tobie, Vishal R, MD   albuterol  (PROVENTIL ) (2.5 MG/3ML) 0.083% nebulizer solution 3 mL, 3 mL, Inhalation, Q6H PRN, Tobie, Vishal R, MD   bisacodyl  (DULCOLAX) EC tablet 5 mg, 5 mg, Oral, Daily PRN, Tobie, Vishal R, MD   citalopram  (CELEXA ) tablet 20 mg, 20 mg, Oral, Daily, Tobie, Vishal R, MD, 20 mg at 10/01/24 9085   cyanocobalamin  (VITAMIN B12) tablet 1,000 mcg, 1,000 mcg, Oral, Daily, Krishnan, Gokul, MD, 1,000 mcg at 10/01/24 0913   enoxaparin  (LOVENOX ) injection 40 mg, 40 mg, Subcutaneous, Q24H, Patel, Vishal R, MD, 40 mg at 10/01/24 9085   haloperidol  lactate (HALDOL ) injection 2 mg, 2 mg, Intravenous, Q6H PRN, Akula, Vijaya, MD   insulin  aspart (novoLOG ) injection 0-9 Units, 0-9 Units, Subcutaneous, TID WC, Patel, Vishal R, MD, 1 Units at 10/01/24 0915   OLANZapine  (ZYPREXA ) tablet 2.5 mg, 2.5 mg, Oral, BID, 2.5 mg at 10/01/24 0914 **OR** OLANZapine  (ZYPREXA ) injection 2.5 mg, 2.5 mg, Intramuscular, BID, Lord, Sharlot GRADE, NP  ondansetron  (ZOFRAN ) tablet 4 mg, 4 mg, Oral, Q6H PRN **OR** ondansetron  (ZOFRAN ) injection 4 mg, 4 mg,  Intravenous, Q6H PRN, Patel, Vishal R, MD   Oral care mouth rinse, 15 mL, Mouth Rinse, PRN, Akula, Vijaya, MD   oxyCODONE  (Oxy IR/ROXICODONE ) immediate release tablet 2.5 mg, 2.5 mg, Oral, Q6H PRN, Akula, Vijaya, MD, 2.5 mg at 09/22/24 1639   senna-docusate (Senokot-S) tablet 1 tablet, 1 tablet, Oral, QHS PRN, Tobie Bloch R, MD   sodium chloride  flush (NS) 0.9 % injection 3 mL, 3 mL, Intravenous, Q12H, Tobie Bloch R, MD, 3 mL at 09/26/24 1022  Allergies: Allergies  Allergen Reactions   Diphenhydramine Other (See Comments)    Makes her hyperactive    Morphine And Codeine Itching    Patient states that she can still take this medication it just makes her itch.    PAULETTE BEETS, MD

## 2024-10-01 NOTE — Care Management Important Message (Signed)
 Important Message  Patient Details  Name: Lynn [name] MRN: [id number] Date of Birth: Jun 16, 1946   Important Message Given:  Yes - Medicare IM     Jennie Laneta Dragon 10/01/2024, 12:47 PM

## 2024-10-01 NOTE — Progress Notes (Signed)
 Occupational Therapy Treatment Patient Details Name: Lynn [name] MRN: [id number] DOB: 07-Jun-1946 Today's Date: 10/01/2024   History of present illness Patient is a 78 yo female presenting to the ED with AMS, found down for an unknown period of time on 09/21/24. Head CT clear. PMH includes:  T2DM, HTN, HLD, CKD stage III, asthma, depression, cognitive impairment/suspected underlying dementia   OT comments  Patient attempting to eat breakfast on OT entry, however leaning to the R. With increased time and patience, OT able to assist patient to center self in bed (total A to complete). Patient complaining of RLE pain, with pillow placed for positioning. Patient assisted with feeding due to visual deficits, with decreased awareness and STM deficits noted throughout. Patient refusing OOB despite encouragement. OT recommendation remains appropriate; will continue to follow.       If plan is discharge home, recommend the following:  Two people to help with walking and/or transfers;A lot of help with bathing/dressing/bathroom;Assistance with cooking/housework;Assistance with feeding;Direct supervision/assist for medications management;Direct supervision/assist for financial management;Assist for transportation;Help with stairs or ramp for entrance;Supervision due to cognitive status   Equipment Recommendations  Other (comment) (defer to next venue)    Recommendations for Other Services      Precautions / Restrictions Precautions Precautions: Fall Recall of Precautions/Restrictions: Impaired Restrictions Weight Bearing Restrictions Per Provider Order: No       Mobility Bed Mobility Overal bed mobility: Needs Assistance             General bed mobility comments: total A to reposition in bed    Transfers Overall transfer level: Needs assistance                 General transfer comment: declined     Balance Overall balance assessment: Needs assistance                                          ADL either performed or assessed with clinical judgement   ADL Overall ADL's : Needs assistance/impaired Eating/Feeding: Moderate assistance Eating/Feeding Details (indicate cue type and reason): assist with food due to visual deficits Grooming: Set up;Wash/dry hands;Wash/dry face;Bed level                               Functional mobility during ADLs: Maximal assistance;+2 for physical assistance;+2 for safety/equipment;Cueing for safety;Rolling walker (2 wheels);Cueing for sequencing General ADL Comments: Patient attempting to eat breakfast on OT entry, however leaning to the R. With increased time and patience, OT able to assist patient to center self in bed (total A to complete). Patient complaining of RLE pain, with pillow placed for positioning. Patient assisted with feeding due to visual deficits, with decreased awareness and STM deficits noted throughout. Patient refusing OOB despite encouragement. OT recommendation remains appropriate; will continue to follow.    Extremity/Trunk Assessment              Occupational Psychologist Communication: Impaired Factors Affecting Communication: Difficulty expressing self   Cognition Arousal: Alert Behavior During Therapy: Flat affect Cognition: Cognition impaired   Orientation impairments: Time, Situation Awareness: Intellectual awareness impaired, Online awareness impaired Memory impairment (select all impairments): Short-term memory, Working civil service fast streamer, Non-declarative long-term memory, Geneticist, Molecular long-term memory Attention impairment (select first level of  impairment): Focused attention Executive functioning impairment (select all impairments): Initiation, Organization, Sequencing, Reasoning, Problem solving OT - Cognition Comments: impaired at baseline, knew she was in the hospital, thought it was nighttime                  Following commands: Impaired Following commands impaired: Follows one step commands inconsistently      Cueing   Cueing Techniques: Verbal cues, Tactile cues, Visual cues  Exercises      Shoulder Instructions       General Comments VSS on RA    Pertinent Vitals/ Pain       Pain Assessment Pain Assessment: Faces Faces Pain Scale: Hurts even more Pain Location: RLE Pain Descriptors / Indicators: Aching Pain Intervention(s): Limited activity within patient's tolerance, Monitored during session, Repositioned  Home Living                                          Prior Functioning/Environment              Frequency  Min 2X/week        Progress Toward Goals  OT Goals(current goals can now be found in the care plan section)  Progress towards OT goals: Progressing toward goals (minimally)  Acute Rehab OT Goals Patient Stated Goal: unable OT Goal Formulation: Patient unable to participate in goal setting Time For Goal Achievement: 10/06/24 Potential to Achieve Goals: Fair  Plan      Co-evaluation                 AM-PAC OT 6 Clicks Daily Activity     Outcome Measure   Help from another person eating meals?: A Lot Help from another person taking care of personal grooming?: A Little Help from another person toileting, which includes using toliet, bedpan, or urinal?: Total Help from another person bathing (including washing, rinsing, drying)?: A Lot Help from another person to put on and taking off regular upper body clothing?: A Little Help from another person to put on and taking off regular lower body clothing?: Total 6 Click Score: 12    End of Session    OT Visit Diagnosis: Unsteadiness on feet (R26.81);Other abnormalities of gait and mobility (R26.89);Repeated falls (R29.6);Muscle weakness (generalized) (M62.81);History of falling (Z91.81);Other symptoms and signs involving cognitive function;Pain Pain - Right/Left: Right Pain  - part of body: Leg   Activity Tolerance Patient limited by fatigue;Patient limited by lethargy;Patient limited by pain   Patient Left in bed;with bed alarm set;with call bell/phone within reach   Nurse Communication Mobility status        Time: 9168-9095 OT Time Calculation (min): 33 min  Charges: OT General Charges $OT Visit: 1 Visit OT Treatments $Self Care/Home Management : 23-37 mins  Ronal Gift E. Tiphanie Vo, OTR/L Acute Rehabilitation Services [phone]   Ronal Gift Salt 10/01/2024, 11:45 AM

## 2024-10-01 NOTE — Plan of Care (Signed)
   Problem: Safety: Goal: Ability to remain free from injury will improve Outcome: Progressing

## 2024-10-01 NOTE — Progress Notes (Signed)
 Started to give Olanzapine  to the patient but patient is requesting for a physician to explain the medication. Explained to the patient that the bedside physician only available for emergencies but the patient insisted on having a physician explain to her the medication and wants to make sure that physicians are not sleeping. Charge Nurse is aware of the situation and attempted to give the medication but to no avail. Informed Abigail Chavez, NP.

## 2024-10-02 DIAGNOSIS — R4182 Altered mental status, unspecified: Secondary | ICD-10-CM | POA: Diagnosis not present

## 2024-10-02 DIAGNOSIS — W19XXXA Unspecified fall, initial encounter: Secondary | ICD-10-CM | POA: Diagnosis not present

## 2024-10-02 DIAGNOSIS — Y92009 Unspecified place in unspecified non-institutional (private) residence as the place of occurrence of the external cause: Secondary | ICD-10-CM | POA: Diagnosis not present

## 2024-10-02 LAB — GLUCOSE, CAPILLARY
Glucose-Capillary: 159 mg/dL — ABNORMAL HIGH (ref 70–99)
Glucose-Capillary: 189 mg/dL — ABNORMAL HIGH (ref 70–99)
Glucose-Capillary: 167 mg/dL — ABNORMAL HIGH (ref 70–99)
Glucose-Capillary: 149 mg/dL — ABNORMAL HIGH (ref 70–99)

## 2024-10-02 NOTE — Plan of Care (Signed)
   Problem: Safety: Goal: Ability to remain free from injury will improve Outcome: Progressing

## 2024-10-02 NOTE — Plan of Care (Signed)
  Problem: Elimination: Goal: Will not experience complications related to urinary retention Outcome: Progressing   Problem: Pain Managment: Goal: General experience of comfort will improve and/or be controlled Outcome: Progressing   Problem: Safety: Goal: Ability to remain free from injury will improve Outcome: Progressing
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
## 2024-10-02 NOTE — Progress Notes (Signed)
 PROGRESS NOTE  Lynn [name] [facility]  DOB: 1946/01/25  PCP: Lynn Lauraine BRAVO, NP FMW:[id number]  DOA: 09/21/2024  LOS: 4 days  Hospital Day: 12  Subjective: Patient was seen and examined this morning. Propped up in bed.  Alert, awake.  Less agitated and more pleasant today. Afebrile, hemodynamically stable Blood sugar level close to 150 consistently  Patient states that she has been able to walk to the bathroom independently but last PT note from 2 days ago states he needed maximal assistance to stand.  Brief narrative: Lynn [name] is a 78 y.o. female with PMH significant for DM2, HTN, HLD, CKD, asthma, depression, cognitive impairment/suspected underlying dementia  11/16, patient was brought to the ED from home for altered mental status, found on the floor after an unwitnessed fall at  with prolonged downtime. CK level was elevated. CT head unremarkable for acute abnormality Admitted to [facility] for fall, rhabdomyolysis  Seen by PT, recommended SNF 11/21, patient was prepared for discharge to SNF but when EMS came to pick her up, patient refused to go.  Given her behaviors issues, psychiatry was consulted  Assessment and plan: Unwitnessed fall at home Mild rhabdomyolysis CK on admission at 321 improved to 159 with IV fluids. Currently not on IV hydration   Dementia with behavioral disturbance  H/o depression 11/21 she refused to go to SNF.  Psychiatry was consulted.   Last seen by psychiatry on 11/26 Currently on olanzapine  and Celexa  Continues to have uncooperative behavior with intermittent agitation.  Noted event from last night where she was not cooperative with nursing care and also did not want to take medicine as prescribed    Essential hypertension Patient was on amlodipine  and losartan  but she had hypotension on 11/23 so these medications were held.  Blood pressure seems to be reasonably well-controlled off of antihypertensives.  Elevated this morning.  Monitor     Mildly elevated troponins Patient denies any chest pain or shortness of breath EKG shows sinus rhythm with mild ST elevation in the lateral leads.  Echocardiogram shows normal LVEF without any wall motion abnormalities. Seen by cardiology.  No plans for further cardiac testing at this time.   Type 2 DM Continue with SSI.  HbA1c 6.6. CBGs are reasonably well-controlled.   Hyperlipidemia On statin at home.    Stage 3a ckd Creatinine at baseline.  Recent Labs    03/28/24 1551 09/21/24 1819 09/21/24 1834 09/22/24 1124 09/24/24 0514 09/25/24 0447 09/28/24 0615 10/01/24 0433  BUN 14 17 22 18 14 17 23 22   CREATININE 0.97 1.18* 1.00 1.15* 1.01* 1.01* 1.11* 1.14*  CO2 28 23  --  22 22 21* 31 26    Impaired mobility Seen by PT, recommended SNF 11/21, patient was prepared for discharge to SNF but when EMS came to pick her up, patient refused to go. Patient continues to refuse to go to SNF.  She believes that she is walking 'just fine'.  I have asked her to participate with PT/mobility today/tomorrow and show her strength.  Hope she can do better and gets to go home just like she wants    PT Orders: Active   PT Follow up Rec: Skilled Nursing-Short Term Rehab (<3 Hours/Day)09/30/2024 1321    Goals of care   Code Status: Limited: Do not attempt resuscitation (DNR) -DNR-LIMITED -Do Not Intubate/DNI   Palliative care consult appreciated    DVT prophylaxis: enoxaparin  (LOVENOX ) injection 40 mg Start: 09/22/24 2200   Antimicrobials: None Fluid: None Consultants: Psychiatry Family  Communication: None at bedside  Status: Inpatient Level of care:  Med-Surg   Patient is from: Home Needs to continue in-hospital care: SNF versus home with home health.  As of now SNF is the recommendation but patient refuses to go.   Diet:  Diet Order             Diet general           Diet regular Room service appropriate? Yes; Fluid consistency: Thin  Diet effective now                    Scheduled Meds:  citalopram   20 mg Oral Daily   vitamin B-12  1,000 mcg Oral Daily   enoxaparin  (LOVENOX ) injection  40 mg Subcutaneous Q24H   insulin  aspart  0-9 Units Subcutaneous TID WC   OLANZapine   2.5 mg Oral BID   Or   OLANZapine   2.5 mg Intramuscular BID   sodium chloride  flush  3 mL Intravenous Q12H    PRN meds: acetaminophen  **OR** acetaminophen , albuterol , bisacodyl , haloperidol  lactate, ondansetron  **OR** ondansetron  (ZOFRAN ) IV, mouth rinse, oxyCODONE , senna-docusate   Infusions:    Antimicrobials: Anti-infectives (From admission, onward)    None       Objective: Vitals:   10/01/24 1755 10/01/24 2119  BP: 115/83 (!) 162/81  Pulse: 96 87  Resp: 16 18  Temp: 98.2 F (36.8 C) 98.1 F (36.7 C)  SpO2: 95% 96%   No intake or output data in the 24 hours ending 10/02/24 1105  Filed Weights   09/23/24 1816  Weight: 87.2 kg   Weight change:  Body mass index is 31.03 kg/m.   Physical Exam: General exam: Pleasant, elderly Caucasian female.  Not in distress Skin: No rashes, lesions or ulcers. HEENT: Atraumatic, normocephalic, no obvious bleeding Lungs: Clear to auscultation bilaterally,  CVS: S1, S2, no murmur,   GI/Abd: Soft, nontender, nondistended, bowel sound present,   CNS: Alert, awake, oriented to place and person Psychiatry: Mood appropriate today. Extremities: No pedal edema, no calf tenderness,   Data Review: I have personally reviewed the laboratory data and studies available.  F/u labs ordered Unresulted Labs (From admission, onward)    None       Signed, Chapman Rota, MD [facility] 10/02/2024

## 2024-10-02 NOTE — Consult Note (Signed)
 [facility] Psychiatric Consult Initial  Patient Name: .[name]  MRN: [id number]  DOB: 1946/01/04  Consult Order details:  Orders (From admission, onward)     Start     Ordered   09/27/24 1202  IP CONSULT TO PSYCHIATRY       Ordering Provider: Verdene Purchase, MD  Provider:  (Not yet assigned)  Question Answer Comment  Location [facility]   Reason for Consult? patient with dementia, behaving irrationally, refusing SNF but also doesn't want to leave the hospital. refusing meds.no active medical issue. Please assist with management.      09/27/24 1202             Mode of Visit: In person    Psychiatry Consult Evaluation  Service Date: October 02, 2024 LOS:  LOS: 4 days  Chief Complaint Everybody says I'm crazy.  Primary Psychiatric Diagnoses  Agitation related to dementia/cognitive decline  Assessment  Lynn [name] is a 78 y.o. female admitted: Medicallyfor 09/21/2024  6:10 PM for a fall.  Dr. Tobie wrote, with medical history significant for T2DM, HTN, HLD, CKD stage III, asthma, depression, cognitive impairment/suspected underlying dementia who presented to the ED from home for evaluation after she was found down on her floor.   Patient lives alone.  She has had progressive decline in ability to care for self and memory.  Family states that she is post to ambulate with a walker but she never uses it.  She has not been taking any of her medications other than Ambien .  She often refuses help from family.  She has not been bathing herself.  She has not been able to take care of her finances.  Her cousins have been having to take care of all of these things.  She has not left her house since her last PCP visit.    Family do call her twice a day and check in on her to make sure she is doing okay.  They last spoke to her last night around 6:30 PM.  She did not answer the the phone today therefore her cousin went to her house to check in on  her.  They found her lying on the hardwood floor undressed.  They tried to get her up.  She was refusing to get up and also thought that she was laying in her own bed.  Family stated that she has confusion at baseline but this appeared worse today.   EMS were called.  Patient was refusing to get up with EMS/fire.  I spent an hour and a half trying to assist her.  Per ED triage documentation patient was combative with EMS and she was given 5 mg of Haldol  and she calmed down afterwards.  Her current presentation of confusion, refusing medications, memory issues, aggressive at times is most consistent with agitation related to dementia (per Dr. Darlean notes). Current outpatient psychotropic medications include Celexa  and Seroquel  and historically she has had minimal response to these medications as she is non compliant with medications prior to admission as evidenced by self and family report.   On examination today on 10/02/2024, there is no significant change in her memory or cognition but she seems to be less agitated and more pleasant.  She remains confused occasionally thinking that she is in rehab and when prompted understands that she is in the hospital.  Progress Notes indicated patient has been demanding to know what the new medication is and requesting a female attending for  assistance.  Diagnoses:  Active Hospital problems: Principal Problem:   Fall at home, initial encounter Active Problems:   T2DM (type 2 diabetes mellitus) (HCC)   Dyslipidemia associated with T2DM (HCC)   HTN associated with DM (HCC)   Asthma   Chronic kidney disease, stage 3a (HCC)   Agitation due to dementia [facility])    Plan   ## Psychiatric Medication Recommendations:  -Continue Zyprexa  2.5 mg BID oral or IM until mood stabilized and takes oral medications.  On discharge she can return to Seroquel  and discontinue Zyprexa . -Continue Celexa  20 mg daily  ## Medical Decision Making Capacity:  On 09/30/2024  Patient does not have capacity to refuse SNF placement or medical treatment at this time. Continue to reassess capacity daily.   ## Further Work-up:  -- most recent EKG on 11/16 had QtC of 446 and 458, 11/17 was 501, repeat order placed -- Pertinent labwork reviewed earlier this admission includes: CBC with diff, EKG, chem panel, and u/a   ## Disposition:-- There are no psychiatric contraindications to discharge at this time  ## Behavioral / Environmental: -Delirium Precautions: Delirium Interventions for Nursing and Staff: - RN to open blinds every AM. - To Bedside: Glasses, hearing aide, and pt's own shoes. Make available to patients. when possible and encourage use. - Encourage po fluids when appropriate, keep fluids within reach. - OOB to chair with meals. - Passive ROM exercises to all extremities with AM & PM care. - RN to assess orientation to person, time and place QAM and PRN. - Recommend extended visitation hours with familiar family/friends as feasible. - Staff to minimize disturbances at night. Turn off television when pt asleep or when not in use. or Patient would benefit from more frequent contact with medical team to delineate plan of care and allow for clarification questions, which will help alleviate anxiety regarding treatment. If possible, try to check back in with the pt in the afternoon.    ## Safety and Observation Level:  - Based on my clinical evaluation, I estimate the patient to be at minimal risk of self harm in the current setting. - At this time, we recommend  routine. This decision is based on my review of the chart including patient's history and current presentation, interview of the patient, mental status examination, and consideration of suicide risk including evaluating suicidal ideation, plan, intent, suicidal or self-harm behaviors, risk factors, and protective factors. This judgment is based on our ability to directly address suicide risk, implement suicide  prevention strategies, and develop a safety plan while the patient is in the clinical setting. Please contact our team if there is a concern that risk level has changed.  CSSR Risk Category:C-SSRS RISK CATEGORY: No Risk  Suicide Risk Assessment: Patient has following modifiable risk factors for suicide: medication noncompliance, which we are addressing by providing an oral and IM option. Patient has following non-modifiable or demographic risk factors for suicide: none Patient has the following protective factors against suicide: Supportive family, no history of suicide attempts, and no history of NSSIB  Thank you for this consult request. Recommendations have been communicated to the primary team.  We will continue to follow at this time.   PAULETTE BEETS, MD       History of Present Illness  Relevant Aspects of [facility] Hospital Course:  Admitted on 09/21/2024 for a fall. They have memory issues that is increasing with confusion and refusal of meds at home and in the hospital.   Patient Report:  09/28/2024 The client was sleeping prior to the assessment.  I was sleeping until you woke me up.  When this provider introduced herself, she said, Good, I need a few more of you because everyone says I'm crazy.  Irritable throughout most of the assessment yet cooperative.  When asked about her depression, she reported, I don't know and I don't care, no suicidal ideations God no!.  She stated, I don't worry about anything here, seems to have anxiety at home however.  Her sleep is like yours, I guess.  Appetite is fine.  Denies hallucinations, paranoia, homicidal ideations.  Denied substance use and I use to smoke and stopped 40 years ago, cigarettes.  She has been refusing her medications to which she feels she does not need, just need to get out of this place yet reported she has no where to go.  When asked about the SNF, she stated, I'm not going to any nursing  home.  Medications adjusted to assist with her mood which will hopefully improved her compliance.  09/29/2024 Patient seen laying in bed this morning on my approach. She stated she did not need to see psychiatry because she's not crazy. The patient is disoriented to place, time, and situation stating it's winter time, she's in her house, and she did not understand why the treatment team was recommending SNF placement. She feels that she can take care of herself in her home that she is currently in. She denies any SI/HI/AVH.  09/30/2024: The patient was seen and reevaluated today.  On today's examination, the patient's 2 cousins also contributed significantly to the information.  Apparently the patient lives alone and the 2 cousins actually help her with the food and ADLs.  Today she is lying in the room with all the shades drawn in an extremely dark room claiming that she is extremely photophobic and that her eyes hurt.  She is also not oriented to place person or situation and just states that it is the winter and does not know if it is before Thanksgiving or after.  She continues to reject all suggestions about care and claims that she can go back home and take care of herself and that she will not have any falls.  She has minimal insight and judgment.  However she continues to deny any active SI/HI/AVH.  10/01/2024: The patient is lying in bed and seems fairly cooperative.  She is alert, oriented to person but not to place or date.  Her speech is of low volume with some latency and occasional irritability.  She denies any depression and denies any active SI/HI/AVH.  She is a lot more conducive to following up with the doctors recommendations and may be going for rehab or to ALF. 10/01/2024: Patient was seen today the chart was reviewed.  According to the records the patient had demanded a female to assist her in moving to the bed because she did not want to female technician because she is fearful of  being assaulted.  She also refused as needed Zyprexa  demanded to know what it was for.  However on mental status examination patient is essentially unchanged.  She is alert, oriented x 1 and cooperative during this interview.  Her speech is of low volume but without any significant looseness of associations or flight of ideas.  She continues to remain confused with poor short-term and long-term memory.  However when asked if she would follow-up with Dr. Recommendations for treatment, she states she has and reports  that she understands that she needs some rehab to get better.  Psych ROS:  Depression: I don't know and I don't care. Anxiety:  Denied Mania (lifetime and current): none Psychosis: (lifetime and current): none  Review of Systems  Psychiatric/Behavioral:  Positive for memory loss. The patient is nervous/anxious.   All other systems reviewed and are negative.    Psychiatric and Social History  Psychiatric History:  Information collected from patient and chart.  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  Exam Findings  Physical Exam: completed by the MD, reviewed. Vital Signs:  Temp:  [97.9 F (36.6 C)-98.2 F (36.8 C)] 98.1 F (36.7 C) (11/26 2119) Pulse Rate:  [84-96] 87 (11/26 2119) Resp:  [16-18] 18 (11/26 2119) BP: (115-162)/(81-83) 162/81 (11/26 2119) SpO2:  [95 %-96 %] 96 % (11/26 2119) Blood pressure (!) 162/81, pulse 87, temperature 98.1 F (36.7 C), temperature source Oral, resp. rate 18, height 5' 6 (1.676 m), weight  87.2 kg, SpO2 96%. Body mass index is 31.03 kg/m.  Physical Exam  Mental Status Exam: General Appearance: Casual  Orientation:  person and place  Memory:  Immediate;   Fair Recent;   Poor Remote;   Fair  Concentration:  Concentration: Fair and Attention Span: Fair  Recall:  Fair to poor  Attention  Fair  Eye Contact:  Good  Speech:  Clear and Coherent  Language:  Good  Volume:  Normal  Mood: irritable  Affect:  Congruent  Thought Process:  Coherent  Thought Content:  goal oriented  Suicidal Thoughts:  No  Homicidal Thoughts:  No  Judgement:  Poor  Insight:  Lacking  Psychomotor Activity:  Decreased  Akathisia:  No  Fund of Knowledge:  Fair      Assets:  Housing Leisure Time Resilience Social Support  Cognition:  Impaired,  Moderate  ADL's:  Impaired  AIMS (if indicated):        Other History   These have been pulled in through the EMR, reviewed, and updated if appropriate.  Family History:  The patient's family history includes Cancer in her mother; Colon cancer (age of onset: 70) in her maternal aunt; Colon cancer (age of onset: 9) in her maternal uncle; Colon cancer (age of onset: 109) in her maternal uncle; Diabetes in her mother and another family member; Heart disease in her mother and another family member; Hyperlipidemia in her father, mother, and another family member; Hypertension in her father, mother, and another family member.  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   DEGENERATION, MACULAR NOS 03/14/2007   DEPRESSION 03/14/2007   DIABETES MELLITUS, TYPE II 03/14/2007   Diarrhea 05/31/2007   DISC DISEASE, LUMBAR 05/25/2009   DIVERTICULOSIS OF COLON 01/11/2009   HYPERLIPIDEMIA 06/21/2010   HYPERTENSION 03/14/2007   Lung abnormality    LT LUNG WITH SPOT HAS BEEN FOLLOWED X 3 YRS DR BURNEY   SEBORRHEIC  KERATOSIS, INFLAMED 12/02/2010  Unspecified hearing loss 01/13/2010   VERTIGO 11/10/2010    Surgical History: Past Surgical History:  Procedure Laterality Date   CHOLECYSTECTOMY  1982   urosespsis post op   JOINT REPLACEMENT  1998   1998 RT KNEE  +2009 REMOVED, REPLACED JOINT   KNEE ARTHROSCOPY Right 1995   right, TNR   left shoulder Left 2012   rotator cuff   SPINE SURGERY  1996   fusion, ruptured disk   TONSILLECTOMY  1952     Medications:   Current Facility-Administered Medications:    acetaminophen  (TYLENOL ) tablet 650 mg, 650 mg, Oral, Q6H PRN **OR** acetaminophen  (TYLENOL ) suppository 650 mg, 650 mg, Rectal, Q6H PRN, Tobie, Vishal R, MD   albuterol  (PROVENTIL ) (2.5 MG/3ML) 0.083% nebulizer solution 3 mL, 3 mL, Inhalation, Q6H PRN, Tobie, Jorie SAUNDERS, MD   bisacodyl  (DULCOLAX) EC tablet 5 mg, 5 mg, Oral, Daily PRN, Tobie Jorie SAUNDERS, MD   citalopram  (CELEXA ) tablet 20 mg, 20 mg, Oral, Daily, Patel, Vishal R, MD, 20 mg at 10/01/24 9085   cyanocobalamin  (VITAMIN B12) tablet 1,000 mcg, 1,000 mcg, Oral, Daily, Krishnan, Gokul, MD, 1,000 mcg at 10/01/24 0913   enoxaparin  (LOVENOX ) injection 40 mg, 40 mg, Subcutaneous, Q24H, Patel, Vishal R, MD, 40 mg at 10/01/24 9085   haloperidol  lactate (HALDOL ) injection 2 mg, 2 mg, Intravenous, Q6H PRN, Akula, Vijaya, MD   insulin  aspart (novoLOG ) injection 0-9 Units, 0-9 Units, Subcutaneous, TID WC, Patel, Vishal R, MD, 2 Units at 10/01/24 1648   OLANZapine  (ZYPREXA ) tablet 2.5 mg, 2.5 mg, Oral, BID, 2.5 mg at 10/01/24 0914 **OR** OLANZapine  (ZYPREXA ) injection 2.5 mg, 2.5 mg, Intramuscular, BID, Lord, Jamison Y, NP   ondansetron  (ZOFRAN ) tablet 4 mg, 4 mg, Oral, Q6H PRN **OR** ondansetron  (ZOFRAN ) injection 4 mg, 4 mg, Intravenous, Q6H PRN, Tobie Jorie SAUNDERS, MD   Oral care mouth rinse, 15 mL, Mouth Rinse, PRN, Akula, Vijaya, MD   oxyCODONE  (Oxy IR/ROXICODONE ) immediate release tablet 2.5 mg, 2.5 mg, Oral, Q6H PRN, Akula, Vijaya, MD, 2.5 mg at  09/22/24 1639   senna-docusate (Senokot-S) tablet 1 tablet, 1 tablet, Oral, QHS PRN, Tobie, Vishal R, MD   sodium chloride  flush (NS) 0.9 % injection 3 mL, 3 mL, Intravenous, Q12H, Tobie Jorie R, MD, 3 mL at 09/26/24 1022  Allergies: Allergies  Allergen Reactions   Diphenhydramine Other (See Comments)    Makes her hyperactive    Morphine And Codeine Itching    Patient states that she can still take this medication it just makes her itch.    PAULETTE BEETS, MD

## 2024-10-03 DIAGNOSIS — W19XXXA Unspecified fall, initial encounter: Secondary | ICD-10-CM | POA: Diagnosis not present

## 2024-10-03 DIAGNOSIS — R4182 Altered mental status, unspecified: Secondary | ICD-10-CM | POA: Diagnosis not present

## 2024-10-03 DIAGNOSIS — I129 Hypertensive chronic kidney disease with stage 1 through stage 4 chronic kidney disease, or unspecified chronic kidney disease: Secondary | ICD-10-CM

## 2024-10-03 DIAGNOSIS — J45909 Unspecified asthma, uncomplicated: Secondary | ICD-10-CM

## 2024-10-03 DIAGNOSIS — Y92009 Unspecified place in unspecified non-institutional (private) residence as the place of occurrence of the external cause: Secondary | ICD-10-CM | POA: Diagnosis not present

## 2024-10-03 DIAGNOSIS — E1122 Type 2 diabetes mellitus with diabetic chronic kidney disease: Secondary | ICD-10-CM

## 2024-10-03 LAB — GLUCOSE, CAPILLARY
Glucose-Capillary: 144 mg/dL — ABNORMAL HIGH (ref 70–99)
Glucose-Capillary: 173 mg/dL — ABNORMAL HIGH (ref 70–99)
Glucose-Capillary: 147 mg/dL — ABNORMAL HIGH (ref 70–99)

## 2024-10-03 NOTE — Consult Note (Signed)
 [facility] Psychiatric Consult Initial  Patient Name: .Lynn [name]  MRN: [id number]  DOB: February 19, 1946  Consult Order details:  Orders (From admission, onward)     Start     Ordered   09/27/24 1202  IP CONSULT TO PSYCHIATRY       Ordering Provider: Verdene Purchase, MD  Provider:  (Not yet assigned)  Question Answer Comment  Location [facility]   Reason for Consult? patient with dementia, behaving irrationally, refusing SNF but also doesn't want to leave the hospital. refusing meds.no active medical issue. Please assist with management.      09/27/24 1202             Mode of Visit: In person    Psychiatry Consult Evaluation  Service Date: October 03, 2024 LOS:  LOS: 5 days  Chief Complaint Everybody says I'm crazy.  Primary Psychiatric Diagnoses  Agitation related to dementia/cognitive decline  Assessment  Lynn [name] is a 78 y.o. female admitted: Medicallyfor 09/21/2024  6:10 PM for a fall.  Dr. Tobie wrote, with medical history significant for T2DM, HTN, HLD, CKD stage III, asthma, depression, cognitive impairment/suspected underlying dementia who presented to the ED from home for evaluation after she was found down on her floor.   Patient lives alone.  She has had progressive decline in ability to care for self and memory.  Family states that she is post to ambulate with a walker but she never uses it.  She has not been taking any of her medications other than Ambien .  She often refuses help from family.  She has not been bathing herself.  She has not been able to take care of her finances.  Her cousins have been having to take care of all of these things.  She has not left her house since her last PCP visit.    Family do call her twice a day and check in on her to make sure she is doing okay.  They last spoke to her last night around 6:30 PM.  She did not answer the the phone today therefore her cousin went to her house to check in on  her.  They found her lying on the hardwood floor undressed.  They tried to get her up.  She was refusing to get up and also thought that she was laying in her own bed.  Family stated that she has confusion at baseline but this appeared worse today.   EMS were called.  Patient was refusing to get up with EMS/fire.  I spent an hour and a half trying to assist her.  Per ED triage documentation patient was combative with EMS and she was given 5 mg of Haldol  and she calmed down afterwards.  Her current presentation of confusion, refusing medications, memory issues, aggressive at times is most consistent with agitation related to dementia (per Dr. Darlean notes). Current outpatient psychotropic medications include Celexa  and Seroquel  and historically she has had minimal response to these medications as she is non compliant with medications prior to admission as evidenced by self and family report.   On examination today on 10/03/2024, the patient continues to be withdrawn with significant memory problems.  However she is not agitated and remains fairly cooperative.  Her sensorium continues to wax and wane and today she thinks she is at home but understands that the doctors are treating her and she has no concerns about pursuing physical therapy or rehab.  No agitation noted.   Diagnoses:  Active Hospital problems: Principal Problem:   Fall at home, initial encounter Active Problems:   T2DM (type 2 diabetes mellitus) (HCC)   Dyslipidemia associated with T2DM (HCC)   HTN associated with DM (HCC)   Asthma   Chronic kidney disease, stage 3a (HCC)   Agitation due to dementia [facility])    Plan   ## Psychiatric Medication Recommendations:  -Continue Zyprexa  2.5 mg BID oral or IM until mood stabilized and takes oral medications.  On discharge she can return to Seroquel  and discontinue Zyprexa . -Continue Celexa  20 mg daily  ## Medical Decision Making Capacity:  On 09/30/2024 Patient does not have  capacity to refuse SNF placement or medical treatment at this time. Continue to reassess capacity daily.   ## Further Work-up:  -- most recent EKG on 11/16 had QtC of 446 and 458, 11/17 was 501, repeat order placed -- Pertinent labwork reviewed earlier this admission includes: CBC with diff, EKG, chem panel, and u/a   ## Disposition:-- There are no psychiatric contraindications to discharge at this time  ## Behavioral / Environmental: -Delirium Precautions: Delirium Interventions for Nursing and Staff: - RN to open blinds every AM. - To Bedside: Glasses, hearing aide, and pt's own shoes. Make available to patients. when possible and encourage use. - Encourage po fluids when appropriate, keep fluids within reach. - OOB to chair with meals. - Passive ROM exercises to all extremities with AM & PM care. - RN to assess orientation to person, time and place QAM and PRN. - Recommend extended visitation hours with familiar family/friends as feasible. - Staff to minimize disturbances at night. Turn off television when pt asleep or when not in use. or Patient would benefit from more frequent contact with medical team to delineate plan of care and allow for clarification questions, which will help alleviate anxiety regarding treatment. If possible, try to check back in with the pt in the afternoon.    ## Safety and Observation Level:  - Based on my clinical evaluation, I estimate the patient to be at minimal risk of self harm in the current setting. - At this time, we recommend  routine. This decision is based on my review of the chart including patient's history and current presentation, interview of the patient, mental status examination, and consideration of suicide risk including evaluating suicidal ideation, plan, intent, suicidal or self-harm behaviors, risk factors, and protective factors. This judgment is based on our ability to directly address suicide risk, implement suicide prevention strategies, and  develop a safety plan while the patient is in the clinical setting. Please contact our team if there is a concern that risk level has changed.  CSSR Risk Category:C-SSRS RISK CATEGORY: No Risk  Suicide Risk Assessment: Patient has following modifiable risk factors for suicide: medication noncompliance, which we are addressing by providing an oral and IM option. Patient has following non-modifiable or demographic risk factors for suicide: none Patient has the following protective factors against suicide: Supportive family, no history of suicide attempts, and no history of NSSIB  Thank you for this consult request. Recommendations have been communicated to the primary team.  We will sign off at this time.   PAULETTE BEETS, MD       History of Present Illness  Relevant Aspects of [facility] Course:  Admitted on 09/21/2024 for a fall. They have memory issues that is increasing with confusion and refusal of meds at home and in the hospital.   Patient Report:  09/28/2024 The client was sleeping  prior to the assessment.  I was sleeping until you woke me up.  When this provider introduced herself, she said, Good, I need a few more of you because everyone says I'm crazy.  Irritable throughout most of the assessment yet cooperative.  When asked about her depression, she reported, I don't know and I don't care, no suicidal ideations God no!.  She stated, I don't worry about anything here, seems to have anxiety at home however.  Her sleep is like yours, I guess.  Appetite is fine.  Denies hallucinations, paranoia, homicidal ideations.  Denied substance use and I use to smoke and stopped 40 years ago, cigarettes.  She has been refusing her medications to which she feels she does not need, just need to get out of this place yet reported she has no where to go.  When asked about the SNF, she stated, I'm not going to any nursing home.  Medications adjusted to assist with her mood  which will hopefully improved her compliance.  09/29/2024 Patient seen laying in bed this morning on my approach. She stated she did not need to see psychiatry because she's not crazy. The patient is disoriented to place, time, and situation stating it's winter time, she's in her house, and she did not understand why the treatment team was recommending SNF placement. She feels that she can take care of herself in her home that she is currently in. She denies any SI/HI/AVH.  09/30/2024: The patient was seen and reevaluated today.  On today's examination, the patient's 2 cousins also contributed significantly to the information.  Apparently the patient lives alone and the 2 cousins actually help her with the food and ADLs.  Today she is lying in the room with all the shades drawn in an extremely dark room claiming that she is extremely photophobic and that her eyes hurt.  She is also not oriented to place person or situation and just states that it is the winter and does not know if it is before Thanksgiving or after.  She continues to reject all suggestions about care and claims that she can go back home and take care of herself and that she will not have any falls.  She has minimal insight and judgment.  However she continues to deny any active SI/HI/AVH.  10/01/2024: The patient is lying in bed and seems fairly cooperative.  She is alert, oriented to person but not to place or date.  Her speech is of low volume with some latency and occasional irritability.  She denies any depression and denies any active SI/HI/AVH.  She is a lot more conducive to following up with the doctors recommendations and may be going for rehab or to ALF. 10/02/2024: Patient was seen today the chart was reviewed.  According to the records the patient had demanded a female to assist her in moving to the bed because she did not want to female technician because she is fearful of being assaulted.  She also refused as needed Zyprexa   demanded to know what it was for.  However on mental status examination patient is essentially unchanged.  She is alert, oriented x 1 and cooperative during this interview.  Her speech is of low volume but without any significant looseness of associations or flight of ideas.  She continues to remain confused with poor short-term and long-term memory.  However when asked if she would follow-up with Dr. Recommendations for treatment, she states she has and reports that she understands that she  needs some rehab to get better. 10/03/2024: The patient is lying in bed somewhat sedated but easily arousable.  She asked who are you?  And does not recall seeing me at all.  She remained fairly pleasant to an extent and thinks that she is at home.  At the same time she does understand her doctors are treating him.  She denies any active SI/HI/AVH.  Cognitively she is impaired.  Psych ROS:  Depression: I don't know and I don't care. Anxiety:  Denied Mania (lifetime and current): none Psychosis: (lifetime and current): none  Review of Systems  Psychiatric/Behavioral:  Positive for memory loss. The patient is nervous/anxious.   All other systems reviewed and are negative.    Psychiatric and Social History  Psychiatric History:  Information collected from patient and chart.  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  Exam Findings  Physical Exam: completed by the MD, reviewed. Vital Signs:  Temp:  [97.5 F (36.4 C)-98.9 F (37.2 C)] 98.2 F (36.8 C)  (11/28 0909) Pulse Rate:  [75-85] 75 (11/28 0909) Resp:  [16] 16 (11/28 0909) BP: (119-165)/(80-97) 165/85 (11/28 0909) SpO2:  [94 %-99 %] 94 % (11/28 0909) Blood pressure (!) 165/85, pulse 75, temperature 98.2 F (36.8 C), temperature source Oral, resp. rate 16, height 5' 6 (1.676 m), weight 87.2 kg, SpO2 94%. Body mass index is 31.03 kg/m.  Physical Exam  Mental Status Exam: General Appearance: Casual  Orientation:  person and place  Memory:  Immediate;   Fair Recent;   Poor Remote;   Fair  Concentration:  Concentration: Fair and Attention Span: Fair  Recall:  Fair to poor  Attention  Fair  Eye Contact:  Good  Speech:  Clear and Coherent  Language:  Good  Volume:  Normal  Mood: irritable  Affect:  Congruent  Thought Process:  Coherent  Thought Content:  goal oriented  Suicidal Thoughts:  No  Homicidal Thoughts:  No  Judgement:  Poor  Insight:  Lacking  Psychomotor Activity:  Decreased  Akathisia:  No  Fund of Knowledge:  Fair      Assets:  Housing Leisure Time Resilience Social Support  Cognition:  Impaired,  Moderate  ADL's:  Impaired  AIMS (if indicated):        Other History   These have been pulled in through the EMR, reviewed, and updated if appropriate.  Family History:  The patient's family history includes Cancer in her mother; Colon cancer (age of onset: 77) in her maternal aunt; Colon cancer (age of onset: 71) in her maternal uncle; Colon cancer (age of onset: 82) in her maternal uncle; Diabetes in her mother and another family member; Heart disease in her mother and another family member; Hyperlipidemia in her father, mother, and another family member; Hypertension in her father, mother, and another family member.  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   DEGENERATION, MACULAR NOS 03/14/2007  DEPRESSION 03/14/2007   DIABETES MELLITUS, TYPE II 03/14/2007   Diarrhea 05/31/2007   DISC DISEASE, LUMBAR 05/25/2009   DIVERTICULOSIS OF COLON 01/11/2009   HYPERLIPIDEMIA 06/21/2010   HYPERTENSION 03/14/2007   Lung abnormality    LT LUNG WITH SPOT HAS BEEN FOLLOWED X 3 YRS DR BURNEY   SEBORRHEIC KERATOSIS, INFLAMED 12/02/2010   Unspecified hearing loss 01/13/2010   VERTIGO 11/10/2010    Surgical History: Past Surgical History:  Procedure Laterality Date   CHOLECYSTECTOMY  1982   urosespsis post op   JOINT REPLACEMENT  1998   1998 RT KNEE  +2009 REMOVED, REPLACED JOINT   KNEE ARTHROSCOPY Right 1995   right, TNR   left shoulder Left 2012   rotator cuff   SPINE SURGERY  1996   fusion, ruptured disk   TONSILLECTOMY  1952     Medications:   Current Facility-Administered Medications:    acetaminophen  (TYLENOL ) tablet 650 mg, 650 mg, Oral, Q6H PRN **OR** acetaminophen  (TYLENOL ) suppository 650 mg, 650 mg, Rectal, Q6H PRN, Tobie, Vishal R, MD   albuterol  (PROVENTIL ) (2.5 MG/3ML) 0.083% nebulizer solution 3 mL, 3 mL, Inhalation, Q6H PRN, Tobie, Vishal R, MD   bisacodyl  (DULCOLAX) EC tablet 5 mg, 5 mg, Oral, Daily PRN, Tobie Jorie SAUNDERS, MD   citalopram  (CELEXA ) tablet 20 mg, 20 mg, Oral, Daily, Tobie, Vishal R, MD, 20 mg at 10/03/24 1051   cyanocobalamin  (VITAMIN B12) tablet 1,000 mcg, 1,000 mcg, Oral, Daily, Krishnan, Gokul, MD, 1,000 mcg at 10/03/24 1051   enoxaparin  (LOVENOX ) injection 40 mg, 40 mg, Subcutaneous, Q24H, Patel, Vishal R, MD, 40 mg at 10/02/24 1054   haloperidol  lactate (HALDOL ) injection 2 mg, 2 mg, Intravenous, Q6H PRN, Akula, Vijaya, MD   insulin  aspart (novoLOG ) injection 0-9 Units, 0-9 Units, Subcutaneous, TID WC, Patel, Vishal R, MD, 1 Units at 10/03/24 9188   OLANZapine  (ZYPREXA ) tablet 2.5 mg, 2.5 mg, Oral, BID, 2.5 mg at 10/03/24 1051 **OR** OLANZapine  (ZYPREXA ) injection 2.5 mg, 2.5 mg, Intramuscular, BID, Lord, Jamison Y, NP   ondansetron  (ZOFRAN ) tablet 4 mg, 4 mg,  Oral, Q6H PRN **OR** ondansetron  (ZOFRAN ) injection 4 mg, 4 mg, Intravenous, Q6H PRN, Tobie Jorie SAUNDERS, MD   Oral care mouth rinse, 15 mL, Mouth Rinse, PRN, Akula, Vijaya, MD   oxyCODONE  (Oxy IR/ROXICODONE ) immediate release tablet 2.5 mg, 2.5 mg, Oral, Q6H PRN, Akula, Vijaya, MD, 2.5 mg at 09/22/24 1639   senna-docusate (Senokot-S) tablet 1 tablet, 1 tablet, Oral, QHS PRN, Tobie, Vishal R, MD   sodium chloride  flush (NS) 0.9 % injection 3 mL, 3 mL, Intravenous, Q12H, Tobie Jorie R, MD, 3 mL at 09/26/24 1022  Allergies: Allergies  Allergen Reactions   Diphenhydramine Other (See Comments)    Makes her hyperactive    Morphine And Codeine Itching    Patient states that she can still take this medication it just makes her itch.    PAULETTE BEETS, MD

## 2024-10-03 NOTE — TOC Progression Note (Signed)
 Transition of Care [facility]) - Progression Note    Patient Details  Name: Lynn [name] MRN: [id number] Date of Birth: [birth year]  [facility] CM/SW Contact  Gorden Stthomas LITTIE Moose, [place] Phone Number: 10/03/2024, 9:10 AM  Clinical Narrative:    CSW restarted auth process for [facility], [id number]. CSW will continue to follow.   Expected Discharge Plan: Skilled Nursing Facility Barriers to Discharge: Barriers Resolved               Expected Discharge Plan and Services       Living arrangements for the past 2 months: Single Family Home Expected Discharge Date: 09/26/24                                     Social Drivers of Health (SDOH) Interventions SDOH Screenings   Food Insecurity: Patient Unable To Answer (09/22/2024)  Housing: Low Risk  (09/23/2024)  Transportation Needs: Patient Unable To Answer (09/22/2024)  Utilities: Patient Unable To Answer (09/22/2024)  Alcohol Screen: Low Risk  (03/06/2024)  Depression (PHQ2-9): Low Risk  (03/06/2024)  Financial Resource Strain: Low Risk  (03/06/2024)  Physical Activity: Inactive (03/06/2024)  Social Connections: Unknown (09/22/2024)  Stress: Stress Concern Present (03/06/2024)  Tobacco Use: Medium Risk (09/21/2024)  Health Literacy: Adequate Health Literacy (03/06/2024)    Readmission Risk Interventions    09/28/2023    2:44 PM  Readmission Risk Prevention Plan  Post Dischage Appt Complete  Medication Screening Complete  Transportation Screening Complete

## 2024-10-03 NOTE — Progress Notes (Signed)
 Patient refused to be changed,patient was incontinent of urine,patient stated do not touch mewill continue to monitor.

## 2024-10-03 NOTE — Progress Notes (Signed)
 PROGRESS NOTE  Lynn [name] [facility]  DOB: May 29, 1946  PCP: Elnor Lauraine BRAVO, NP FMW:[id number]  DOA: 09/21/2024  LOS: 5 days  Hospital Day: 13  Subjective: Patient was seen and examined this morning. Lying on bed.  Not in distress.   Not interested to have a conversation today.  Says she is just waking up. Offensive smell of urine in the room.  Per note, patient was offered to clean up but she refused. Afebrile, hemodynamically stable Blood sugar level consistently close to 150s  Brief narrative: Lynn [name] is a 78 y.o. female with PMH significant for DM2, HTN, HLD, CKD, asthma, depression, cognitive impairment/suspected underlying dementia  11/16, patient was brought to the ED from home for altered mental status, found on the floor after an unwitnessed fall at  with prolonged downtime. CK level was elevated. CT head unremarkable for acute abnormality Admitted to [facility] for fall, rhabdomyolysis  Seen by PT, recommended SNF 11/21, patient was prepared for discharge to SNF but when EMS came to pick her up, patient refused to go.  Given her behaviors issues, psychiatry was consulted  Assessment and plan: Unwitnessed fall at home Mild rhabdomyolysis CK level was elevated to at 321 on admission, improved to 159 with IV fluids. Currently not on IV hydration   Dementia with behavioral disturbance  H/o depression 11/21 she refused to go to SNF.  Psychiatry was consulted.   Last seen by psychiatry on 11/26 Currently on olanzapine  and Celexa  Continues to have uncooperative behavior and intermittent agitation.    Essential hypertension Patient was on amlodipine  and losartan  but she had hypotension on 11/23 so these medications were held.  Blood pressure seems to be reasonably well-controlled off of antihypertensives.  Elevated this morning.  Monitor    Mildly elevated troponins Patient denies any chest pain or shortness of breath EKG shows sinus rhythm with mild ST elevation in  the lateral leads.  Echocardiogram shows normal LVEF without any wall motion abnormalities. Seen by cardiology.  No plans for further cardiac testing at this time.  Type 2 diabetes mellitus A1c 6.6 in 09/23/2024 Currently on SSI/Accu-Cheks Recent Labs  Lab 10/02/24 0937 10/02/24 1251 10/02/24 1654 10/02/24 2042 10/03/24 0746  GLUCAP 159* 189* 167* 149* 144*    Hyperlipidemia On statin at home.    Stage 3a ckd Creatinine at baseline.  Recent Labs    03/28/24 1551 09/21/24 1819 09/21/24 1834 09/22/24 1124 09/24/24 0514 09/25/24 0447 09/28/24 0615 10/01/24 0433  BUN 14 17 22 18 14 17 23 22   CREATININE 0.97 1.18* 1.00 1.15* 1.01* 1.01* 1.11* 1.14*  CO2 28 23  --  22 22 21* 31 26    Impaired mobility Seen by PT, recommended SNF 11/21, patient was prepared for discharge to SNF but when EMS came to pick her up, patient refused to go. Patient continues to refuse to go to SNF.  She believes that she is walking 'just fine'.  I have asked her to participate with PT/mobility again and show her strength.  Hope she can do better and gets to go home just like she wants    PT Orders: Active   PT Follow up Rec: Skilled Nursing-Short Term Rehab (<3 Hours/Day)09/30/2024 1321    Goals of care   Code Status: Limited: Do not attempt resuscitation (DNR) -DNR-LIMITED -Do Not Intubate/DNI   Palliative care consult appreciated    DVT prophylaxis: enoxaparin  (LOVENOX ) injection 40 mg Start: 09/22/24 2200   Antimicrobials: None Fluid: None Consultants: Psychiatry Family Communication: None  at bedside  Status: Inpatient Level of care:  Med-Surg   Patient is from: Home Needs to continue in-hospital care: SNF versus home with home health.  As of now SNF is the recommendation but patient refuses to go.   Diet:  Diet Order             Diet general           Diet regular Room service appropriate? Yes; Fluid consistency: Thin  Diet effective now                    Scheduled Meds:  citalopram   20 mg Oral Daily   vitamin B-12  1,000 mcg Oral Daily   enoxaparin  (LOVENOX ) injection  40 mg Subcutaneous Q24H   insulin  aspart  0-9 Units Subcutaneous TID WC   OLANZapine   2.5 mg Oral BID   Or   OLANZapine   2.5 mg Intramuscular BID   sodium chloride  flush  3 mL Intravenous Q12H    PRN meds: acetaminophen  **OR** acetaminophen , albuterol , bisacodyl , haloperidol  lactate, ondansetron  **OR** ondansetron  (ZOFRAN ) IV, mouth rinse, oxyCODONE , senna-docusate   Infusions:    Antimicrobials: Anti-infectives (From admission, onward)    None       Objective: Vitals:   10/02/24 2043 10/03/24 0909  BP: (!) 142/97 (!) 165/85  Pulse: 81 75  Resp:  16  Temp:  98.2 F (36.8 C)  SpO2: 99% 94%    Intake/Output Summary (Last 24 hours) at 10/03/2024 1018 Last data filed at 10/03/2024 0600 Gross per 24 hour  Intake 0 ml  Output --  Net 0 ml    Filed Weights   09/23/24 1816  Weight: 87.2 kg   Weight change:  Body mass index is 31.03 kg/m.   Physical Exam: General exam: Pleasant, elderly Caucasian female.  Not in distress Skin: No rashes, lesions or ulcers. HEENT: Atraumatic, normocephalic, no obvious bleeding Lungs: Clear to auscultation bilaterally,  CVS: S1, S2, no murmur,   GI/Abd: Soft, nontender, nondistended, bowel sound present,   CNS: Somnolent, opens eyes on command.  Knows she is in the hospital. Psychiatry: Not interested to have a conversation Extremities: No pedal edema, no calf tenderness,   Data Review: I have personally reviewed the laboratory data and studies available.  F/u labs ordered Unresulted Labs (From admission, onward)    None       Signed, Chapman Rota, MD [facility] 10/03/2024

## 2024-10-03 NOTE — Plan of Care (Signed)
  Problem: Elimination: Goal: Will not experience complications related to urinary retention Outcome: Progressing   Problem: Pain Managment: Goal: General experience of comfort will improve and/or be controlled Outcome: Progressing   Problem: Safety: Goal: Ability to remain free from injury will improve Outcome: Progressing

## 2024-10-04 DIAGNOSIS — W19XXXA Unspecified fall, initial encounter: Secondary | ICD-10-CM | POA: Diagnosis not present

## 2024-10-04 DIAGNOSIS — R4182 Altered mental status, unspecified: Secondary | ICD-10-CM | POA: Diagnosis not present

## 2024-10-04 DIAGNOSIS — Y92009 Unspecified place in unspecified non-institutional (private) residence as the place of occurrence of the external cause: Secondary | ICD-10-CM | POA: Diagnosis not present

## 2024-10-04 LAB — GLUCOSE, CAPILLARY
Glucose-Capillary: 116 mg/dL — ABNORMAL HIGH (ref 70–99)
Glucose-Capillary: 130 mg/dL — ABNORMAL HIGH (ref 70–99)
Glucose-Capillary: 130 mg/dL — ABNORMAL HIGH (ref 70–99)
Glucose-Capillary: 131 mg/dL — ABNORMAL HIGH (ref 70–99)
Glucose-Capillary: 132 mg/dL — ABNORMAL HIGH (ref 70–99)

## 2024-10-04 NOTE — Progress Notes (Signed)
   Patient Details Name: YENNIFER SEGOVIA MRN: [id number] DOB: 09/02/1946   Cancelled Treatment:    Reason Eval/Treat Not Completed: Noted new PT order received, however, pt was evaluated on 11/17 and is already on caseload. Per initial evaluation, PT recommending <3hrs post acute rehab. Will continue to follow acutely per established POC.  Kate ORN, PT, DPT Secure Chat Preferred  Rehab Office [phone]  Kate BRAVO Wendolyn 10/04/2024, 2:26 PM

## 2024-10-04 NOTE — Progress Notes (Signed)
 PROGRESS NOTE  Lynn [name] [facility]  DOB: 09/19/1946  PCP: Elnor Lauraine BRAVO, NP FMW:[id number]  DOA: 09/21/2024  LOS: 6 days  Hospital Day: 14  Subjective: Patient was seen and examined this morning. Just waking up Not in a happy mood.  I suggested her to cooperate with nursing care and also try to get out of bed with assistance.  She said 'I do not do get out of bed coz I don't want to.' Family not at bedside Afebrile, hemodynamically stable Blood sugar level consistently in acceptable range  Brief narrative: Lynn [name] is a 78 y.o. female with PMH significant for DM2, HTN, HLD, CKD, asthma, depression, cognitive impairment/suspected underlying dementia  11/16, patient was brought to the ED from home for altered mental status, found on the floor after an unwitnessed fall at  with prolonged downtime. CK level was elevated. CT head unremarkable for acute abnormality Admitted to [facility] for fall, rhabdomyolysis  Seen by PT, recommended SNF 11/21, patient was prepared for discharge to SNF but when EMS came to pick her up, patient refused to go.  Given her behaviors issues, psychiatry was consulted  Assessment and plan: Unwitnessed fall at home Mild rhabdomyolysis CK level was elevated to at 321 on admission, improved to 159 with IV fluids. Currently not on IV hydration   Dementia with behavioral disturbance  H/o depression 11/21 she refused to go to SNF.  Psychiatry was consulted.   Last seen by psychiatry on 11/26 Currently on olanzapine  and Celexa  Continues to have uncooperative behavior and intermittent agitation.    Essential hypertension Patient was on amlodipine  and losartan  but she had hypotension on 11/23 so these medications were held.  Blood pressure remains well-controlled    Mildly elevated troponins Patient denies any chest pain or shortness of breath EKG shows sinus rhythm with mild ST elevation in the lateral leads.  Echocardiogram shows normal LVEF without  any wall motion abnormalities. Seen by cardiology.  No plans for further cardiac testing at this time.  Type 2 diabetes mellitus A1c 6.6 in 09/23/2024 Currently on SSI/Accu-Cheks Recent Labs  Lab 10/03/24 0746 10/03/24 1218 10/03/24 1653 10/03/24 2359 10/04/24 0733  GLUCAP 144* 173* 147* 131* 116*    Hyperlipidemia On statin at home.    Stage 3a ckd Creatinine at baseline.  Recent Labs    03/28/24 1551 09/21/24 1819 09/21/24 1834 09/22/24 1124 09/24/24 0514 09/25/24 0447 09/28/24 0615 10/01/24 0433  BUN 14 17 22 18 14 17 23 22   CREATININE 0.97 1.18* 1.00 1.15* 1.01* 1.01* 1.11* 1.14*  CO2 28 23  --  22 22 21* 31 26    Impaired mobility Seen by PT, recommended SNF 11/21, patient was prepared for discharge to SNF but when EMS came to pick her up, patient refused to go. Patient continues to refuse to go to SNF.  She believes that she is walking 'just fine'.  I have asked her to participate with PT/mobility again and show her strength.  Hope she can do better and gets to go home just like she wants    PT Orders: Active   PT Follow up Rec: Skilled Nursing-Short Term Rehab (<3 Hours/Day)09/30/2024 1321    Goals of care   Code Status: Limited: Do not attempt resuscitation (DNR) -DNR-LIMITED -Do Not Intubate/DNI   Palliative care consult appreciated    DVT prophylaxis: enoxaparin  (LOVENOX ) injection 40 mg Start: 09/22/24 2200   Antimicrobials: None Fluid: None Consultants: Psychiatry Family Communication: None at bedside  Status: Inpatient Level of  care:  Med-Surg   Patient is from: Home Needs to continue in-hospital care: SNF versus home with home health.  As of now SNF is the recommendation but patient refuses to go.   Diet:  Diet Order             Diet general           Diet regular Room service appropriate? Yes; Fluid consistency: Thin  Diet effective now                   Scheduled Meds:  citalopram   20 mg Oral Daily   vitamin B-12   1,000 mcg Oral Daily   enoxaparin  (LOVENOX ) injection  40 mg Subcutaneous Q24H   insulin  aspart  0-9 Units Subcutaneous TID WC   OLANZapine   2.5 mg Oral BID   Or   OLANZapine   2.5 mg Intramuscular BID   sodium chloride  flush  3 mL Intravenous Q12H    PRN meds: acetaminophen  **OR** acetaminophen , albuterol , bisacodyl , haloperidol  lactate, ondansetron  **OR** ondansetron  (ZOFRAN ) IV, mouth rinse, oxyCODONE , senna-docusate   Infusions:    Antimicrobials: Anti-infectives (From admission, onward)    None       Objective: Vitals:   10/04/24 0538 10/04/24 1008  BP: 135/70 118/74  Pulse: 73 71  Resp: 19 16  Temp: 98.3 F (36.8 C) 98.4 F (36.9 C)  SpO2: 96% 95%    Intake/Output Summary (Last 24 hours) at 10/04/2024 1056 Last data filed at 10/03/2024 2300 Gross per 24 hour  Intake 240 ml  Output --  Net 240 ml    Filed Weights   09/23/24 1816  Weight: 87.2 kg   Weight change:  Body mass index is 31.03 kg/m.   Physical Exam: General exam: Pleasant, elderly Caucasian female. Not in distress. Skin: No rashes, lesions or ulcers. HEENT: Atraumatic, normocephalic, no obvious bleeding Lungs: Clear to auscultation bilaterally,  CVS: S1, S2, no murmur,   GI/Abd: Soft, nontender, nondistended, bowel sound present,   CNS: Wakes up on command, oriented to place and person Psychiatry: Not happy and not interested to have a conversation or cooperation this morning Extremities: No pedal edema, no calf tenderness,   Data Review: I have personally reviewed the laboratory data and studies available.  F/u labs ordered Unresulted Labs (From admission, onward)    None       Signed, Chapman Rota, MD [facility] 10/04/2024

## 2024-10-04 NOTE — TOC Progression Note (Signed)
 Transition of Care [facility]) - Progression Note    Patient Details  Name: Lynn [name] MRN: [id number] Date of Birth: [birth year]  Transition of Care [facility]) CM/SW Contact  Brecken Walth A Alyze Lauf, LCSW Phone Number: 10/04/2024, 11:28 AM  Clinical Narrative:     Pt's authorization was approved for [facility].  Auth ID: [id number]. Approval Dates: 10/03/24-10/07/24.  Per MD note, pt continues to refuse SNF, CSW notified MD of approval. Plan for DC pending.   Expected Discharge Plan: Skilled Nursing Facility Barriers to Discharge: Patient SNF vs Home               Expected Discharge Plan and Services       Living arrangements for the past 2 months: Single Family Home Expected Discharge Date: 09/26/24                                     Social Drivers of Health (SDOH) Interventions SDOH Screenings   Food Insecurity: Patient Unable To Answer (09/22/2024)  Housing: Low Risk  (09/23/2024)  Transportation Needs: Patient Unable To Answer (09/22/2024)  Utilities: Patient Unable To Answer (09/22/2024)  Alcohol Screen: Low Risk  (03/06/2024)  Depression (PHQ2-9): Low Risk  (03/06/2024)  Financial Resource Strain: Low Risk  (03/06/2024)  Physical Activity: Inactive (03/06/2024)  Social Connections: Unknown (09/22/2024)  Stress: Stress Concern Present (03/06/2024)  Tobacco Use: Medium Risk (09/21/2024)  Health Literacy: Adequate Health Literacy (03/06/2024)    Readmission Risk Interventions    09/28/2023    2:44 PM  Readmission Risk Prevention Plan  Post Dischage Appt Complete  Medication Screening Complete  Transportation Screening Complete

## 2024-10-05 DIAGNOSIS — I10 Essential (primary) hypertension: Secondary | ICD-10-CM | POA: Diagnosis not present

## 2024-10-05 DIAGNOSIS — W19XXXA Unspecified fall, initial encounter: Secondary | ICD-10-CM | POA: Diagnosis not present

## 2024-10-05 DIAGNOSIS — Y92009 Unspecified place in unspecified non-institutional (private) residence as the place of occurrence of the external cause: Secondary | ICD-10-CM | POA: Diagnosis not present

## 2024-10-05 LAB — GLUCOSE, CAPILLARY
Glucose-Capillary: 102 mg/dL — ABNORMAL HIGH (ref 70–99)
Glucose-Capillary: 125 mg/dL — ABNORMAL HIGH (ref 70–99)
Glucose-Capillary: 233 mg/dL — ABNORMAL HIGH (ref 70–99)

## 2024-10-05 MED ORDER — STERILE WATER FOR INJECTION IJ SOLN
INTRAMUSCULAR | Status: AC
  Filled 2024-10-05: qty 10

## 2024-10-05 NOTE — Plan of Care (Signed)
   Problem: Elimination: Goal: Will not experience complications related to urinary retention Outcome: Progressing   Problem: Safety: Goal: Ability to remain free from injury will improve Outcome: Progressing

## 2024-10-05 NOTE — Progress Notes (Signed)
 PROGRESS NOTE  Lynn [name] [facility]  DOB: 11-05-1946  PCP: Elnor Lauraine BRAVO, NP FMW:[id number]  DOA: 09/21/2024  LOS: 7 days  Hospital Day: 15  Subjective: Patient was seen and examined this morning. Somnolent.  Opens eyes on command.  Again not cooperative to answering questions today. I have reached out to physical therapy to put on the list for eval today. Afebrile, hemodynamically stable Blood sugar level in range.  Brief narrative: Lynn [name] is a 78 y.o. female with PMH significant for DM2, HTN, HLD, CKD, asthma, depression, cognitive impairment/suspected underlying dementia  11/16, patient was brought to the ED from home for altered mental status, found on the floor after an unwitnessed fall at  with prolonged downtime. CK level was elevated. CT head unremarkable for acute abnormality Admitted to [facility] for fall, rhabdomyolysis  Seen by PT, recommended SNF 11/21, patient was prepared for discharge to SNF but when EMS came to pick her up, patient refused to go.  Given her behaviors issues, psychiatry was consulted  Assessment and plan: Unwitnessed fall at home Mild rhabdomyolysis CK level was elevated to at 321 on admission, improved to 159 with IV fluids. Currently not on IV hydration   Dementia with behavioral disturbance  H/o depression 11/21 she refused to go to SNF.  Psychiatry was consulted.   Last seen by psychiatry on 11/26 Currently on olanzapine  and Celexa  Continues to have uncooperative behavior and intermittent agitation.    Essential hypertension Patient was on amlodipine  and losartan  but she had hypotension on 11/23 so these medications were held.  Blood pressure remains well-controlled    Mildly elevated troponins Patient denies any chest pain or shortness of breath EKG shows sinus rhythm with mild ST elevation in the lateral leads.  Echocardiogram shows normal LVEF without any wall motion abnormalities. Seen by cardiology.  No plans for further  cardiac testing at this time.  Type 2 diabetes mellitus A1c 6.6 in 09/23/2024 Currently on SSI/Accu-Cheks Recent Labs  Lab 10/04/24 0733 10/04/24 1147 10/04/24 1652 10/04/24 2045 10/05/24 0726  GLUCAP 116* 130* 130* 132* 102*    Hyperlipidemia On statin at home.    Stage 3a ckd Creatinine at baseline.  Recent Labs    03/28/24 1551 09/21/24 1819 09/21/24 1834 09/22/24 1124 09/24/24 0514 09/25/24 0447 09/28/24 0615 10/01/24 0433  BUN 14 17 22 18 14 17 23 22   CREATININE 0.97 1.18* 1.00 1.15* 1.01* 1.01* 1.11* 1.14*  CO2 28 23  --  22 22 21* 31 26    Impaired mobility Seen by PT, recommended SNF 11/21, patient was prepared for discharge to SNF but when EMS came to pick her up, patient refused to go. Patient continues to refuse to go to SNF.  She believes that she is walking 'just fine'.  I have asked her to participate with PT/mobility again and show her strength.  Hope she can do better and gets to go home just like she wants.    PT Orders: Active   PT Follow up Rec: Skilled Nursing-Short Term Rehab (<3 Hours/Day)09/30/2024 1321    Goals of care   Code Status: Limited: Do not attempt resuscitation (DNR) -DNR-LIMITED -Do Not Intubate/DNI   Palliative care consult appreciated    DVT prophylaxis: enoxaparin  (LOVENOX ) injection 40 mg Start: 09/22/24 2200   Antimicrobials: None Fluid: None Consultants: Psychiatry Family Communication: None at bedside  Status: Inpatient Level of care:  Med-Surg   Patient is from: Home Needs to continue in-hospital care: SNF versus home with home health.  As of now SNF is the recommendation but patient refuses to go.   Diet:  Diet Order             Diet general           Diet regular Room service appropriate? Yes; Fluid consistency: Thin  Diet effective now                   Scheduled Meds:  citalopram   20 mg Oral Daily   vitamin B-12  1,000 mcg Oral Daily   enoxaparin  (LOVENOX ) injection  40 mg Subcutaneous  Q24H   insulin  aspart  0-9 Units Subcutaneous TID WC   OLANZapine   2.5 mg Oral BID   Or   OLANZapine   2.5 mg Intramuscular BID   sodium chloride  flush  3 mL Intravenous Q12H    PRN meds: acetaminophen  **OR** acetaminophen , albuterol , bisacodyl , haloperidol  lactate, ondansetron  **OR** ondansetron  (ZOFRAN ) IV, mouth rinse, oxyCODONE , senna-docusate   Infusions:    Antimicrobials: Anti-infectives (From admission, onward)    None       Objective: Vitals:   10/05/24 0605 10/05/24 1010  BP: (!) 152/83 132/83  Pulse: 90 (!) 108  Resp: 18 16  Temp: 99 F (37.2 C) 98 F (36.7 C)  SpO2: 94% 94%   No intake or output data in the 24 hours ending 10/05/24 1033   Filed Weights   09/23/24 1816  Weight: 87.2 kg   Weight change:  Body mass index is 31.03 kg/m.   Physical Exam: General exam: Pleasant, elderly Caucasian female. Not in distress. Skin: No rashes, lesions or ulcers. HEENT: Atraumatic, normocephalic, no obvious bleeding Lungs: Clear to auscultation bilaterally,  CVS: S1, S2, no murmur,   GI/Abd: Soft, nontender, nondistended, bowel sound present,   CNS: Wakes up on command, oriented to place and person Psychiatry: Not happy and not interested to have a conversation or cooperation this morning Extremities: No pedal edema, no calf tenderness,   Data Review: I have personally reviewed the laboratory data and studies available.  F/u labs ordered Unresulted Labs (From admission, onward)    None       Signed, Chapman Rota, MD [facility] 10/05/2024

## 2024-10-05 NOTE — Plan of Care (Signed)
  Problem: Pain Managment: Goal: General experience of comfort will improve and/or be controlled Outcome: Progressing   Problem: Safety: Goal: Ability to remain free from injury will improve Outcome: Progressing

## 2024-10-05 NOTE — Progress Notes (Signed)
 Patient states she is scared at nights and wants someone to stay in the room with her.  She was educated on the call bell and how she can reach me if she needed me.  She ask that we check on her frequently if we can't stay in the room with her.

## 2024-10-05 NOTE — Progress Notes (Signed)
 Pt refusing to take all morning meds after third attempt. Says she will take when she gets up  Provider notified.

## 2024-10-06 DIAGNOSIS — W19XXXA Unspecified fall, initial encounter: Secondary | ICD-10-CM | POA: Diagnosis not present

## 2024-10-06 DIAGNOSIS — I10 Essential (primary) hypertension: Secondary | ICD-10-CM | POA: Diagnosis not present

## 2024-10-06 DIAGNOSIS — Y92009 Unspecified place in unspecified non-institutional (private) residence as the place of occurrence of the external cause: Secondary | ICD-10-CM | POA: Diagnosis not present

## 2024-10-06 LAB — GLUCOSE, CAPILLARY
Glucose-Capillary: 118 mg/dL — ABNORMAL HIGH (ref 70–99)
Glucose-Capillary: 179 mg/dL — ABNORMAL HIGH (ref 70–99)

## 2024-10-06 MED ORDER — OXYCODONE HCL 5 MG PO TABS: 2.5000 mg | ORAL_TABLET | Freq: Four times a day (QID) | ORAL | 0 refills | Status: AC | PRN

## 2024-10-06 MED ORDER — CYANOCOBALAMIN 1000 MCG PO TABS: 1000.0000 ug | ORAL_TABLET | Freq: Every day | ORAL | Status: AC

## 2024-10-06 MED ORDER — OLANZAPINE 2.5 MG PO TABS: 2.5000 mg | ORAL_TABLET | Freq: Two times a day (BID) | ORAL | 0 refills | Status: DC

## 2024-10-06 MED ORDER — INSULIN ASPART 100 UNIT/ML IJ SOLN: 0.0000 [IU] | Freq: Three times a day (TID) | INTRAMUSCULAR | Status: AC

## 2024-10-06 MED ORDER — OLANZAPINE 2.5 MG PO TABS: 2.5000 mg | ORAL_TABLET | Freq: Two times a day (BID) | ORAL | 0 refills | Status: AC

## 2024-10-06 NOTE — Discharge Summary (Addendum)
 Physician Discharge Summary  Lynn [name] FMW:[id number] DOB: 1946-11-03 DOA: 09/21/2024  PCP: Elnor Lauraine BRAVO, NP  Admit date: 09/21/2024 Discharge date: 10/06/2024  Admitted from: Home Discharge disposition: SNF   Subjective: Patient was seen and examined this morning. Lying down on bed.  Not in distress. Afebrile, hemodynamically stable Blood sugar level mostly in controlled range Cousins at bedside. Patient was able to have a good soft conversation with me today.  She is agreeable to go to rehab today  Brief narrative: Lynn [name] is a 78 y.o. female with PMH significant for DM2, HTN, HLD, CKD, asthma, depression, cognitive impairment/suspected underlying dementia  11/16, patient was brought to the ED from home for altered mental status, found on the floor after an unwitnessed fall at  with prolonged downtime. CK level was elevated. CT head unremarkable for acute abnormality Admitted to [facility] for fall, rhabdomyolysis  Seen by PT, recommended SNF 11/21, patient was prepared for discharge to SNF but when EMS came to pick her up, patient refused to go.  Given her behaviors issues, psychiatry was consulted  Hospital course: Unwitnessed fall at home Mild rhabdomyolysis CK level was elevated to 321 on admission, improved to 159 with IV fluids. Encourage oral hydration   Dementia with behavioral disturbance  H/o depression 11/21 she refused to go to SNF.  Psychiatry was consulted.   Last seen by psychiatry on 11/26 Currently on olanzapine  and Celexa  Continues to have intermittent uncooperative behavior and intermittent agitation.  But calm and cooperative this morning   Essential hypertension Patient was on amlodipine  and losartan  but she had hypotension on 11/23 so these medications were held.  Blood pressure remains well-controlled without medicines    Mildly elevated troponins Patient denies any chest pain or shortness of breath EKG shows sinus rhythm with  mild ST elevation in the lateral leads.  Echocardiogram shows normal LVEF without any wall motion abnormalities. Seen by cardiology.  No plans for further cardiac testing at this time.  Type 2 diabetes mellitus A1c 6.6 in 09/23/2024 Currently on SSI/Accu-Cheks only with blood sugars mostly in controlled range.   Hyperlipidemia On statin at home.    Stage 3a ckd Creatinine at baseline.  Recent Labs    03/28/24 1551 09/21/24 1819 09/21/24 1834 09/22/24 1124 09/24/24 0514 09/25/24 0447 09/28/24 0615 10/01/24 0433  BUN 14 17 22 18 14 17 23 22   CREATININE 0.97 1.18* 1.00 1.15* 1.01* 1.01* 1.11* 1.14*  CO2 28 23  --  22 22 21* 31 26    Impaired mobility Seen by PT, recommended SNF PT Follow up Rec: Skilled Nursing-Short Term Rehab (<3 Hours/Day)09/30/2024 1321   Goals of care   Code Status: Limited: Do not attempt resuscitation (DNR) -DNR-LIMITED -Do Not Intubate/DNI   Palliative care consult appreciated  Diet:  Diet Order             Diet general           Diet regular Room service appropriate? Yes; Fluid consistency: Thin  Diet effective now                   Nutritional status:  Body mass index is 31.03 kg/m.       Wounds:  -    Discharge Medications:   Allergies as of 10/06/2024       Reactions   Diphenhydramine Other (See Comments)   Makes her hyperactive    Morphine And Codeine Itching   Patient states that she can still take  this medication it just makes her itch.        Medication List     STOP taking these medications    losartan  25 MG tablet Commonly known as: COZAAR    metFORMIN  500 MG tablet Commonly known as: GLUCOPHAGE    montelukast  10 MG tablet Commonly known as: SINGULAIR    zolpidem  5 MG tablet Commonly known as: AMBIEN        TAKE these medications    acetaminophen  325 MG tablet Commonly known as: TYLENOL  Take 650 mg by mouth every 6 (six) hours as needed for moderate pain.   albuterol  108 (90 Base) MCG/ACT  inhaler Commonly known as: Proventil  HFA Inhale 2 puffs into the lungs every 6 (six) hours as needed for wheezing or shortness of breath.   atorvastatin  40 MG tablet Commonly known as: LIPITOR Take 1 tablet (40 mg total) by mouth daily.   citalopram  20 MG tablet Commonly known as: CELEXA  Take 1 tablet (20 mg) by mouth daily.   cyanocobalamin  1000 MCG tablet Take 1 tablet (1,000 mcg total) by mouth daily. Start taking on: October 07, 2024   docusate sodium  100 MG capsule Commonly known as: COLACE Take 100 mg by mouth daily as needed for mild constipation or moderate constipation. Reported on 10/21/2015   insulin  aspart 100 UNIT/ML injection Commonly known as: novoLOG  Inject 0-9 Units into the skin 3 (three) times daily with meals.   OLANZapine  2.5 MG tablet Commonly known as: ZYPREXA  Take 1 tablet (2.5 mg total) by mouth 2 (two) times daily.   oxyCODONE  5 MG immediate release tablet Commonly known as: Oxy IR/ROXICODONE  Take 0.5 tablets (2.5 mg total) by mouth every 6 (six) hours as needed for moderate pain (pain score 4-6) or severe pain (pain score 7-10).   Qvar  RediHaler 40 MCG/ACT inhaler Generic drug: beclomethasone Inhale 1 puff into the lungs 2 (two) times daily.         Follow ups:    Contact information for follow-up providers     Elnor Lauraine BRAVO, NP. Schedule an appointment as soon as possible for a visit in 1 week(s).   Specialty: Nurse Practitioner Why: post hospitalization follow up Contact information: [address] [place] [address] [phone]              Contact information for after-discharge care     Destination     [facility] .   Service: Skilled Nursing Contact information: [address] [place]  [address] [phone]                     Discharge Instructions:   Discharge Instructions     Call MD for:  difficulty breathing, headache or visual disturbances   Complete by: As  directed    Call MD for:  extreme fatigue   Complete by: As directed    Call MD for:  persistant dizziness or light-headedness   Complete by: As directed    Call MD for:  persistant nausea and vomiting   Complete by: As directed    Call MD for:  severe uncontrolled pain   Complete by: As directed    Call MD for:  temperature >100.4   Complete by: As directed    Diet general   Complete by: As directed    Discharge instructions   Complete by: As directed    Please review instructions on the discharge summary.  You were cared for by a hospitalist during your hospital stay. If you have any  questions about your discharge medications or the care you received while you were in the hospital after you are discharged, you can call the unit and asked to speak with the hospitalist on call if the hospitalist that took care of you is not available. Once you are discharged, your primary care physician will handle any further medical issues. Please note that NO REFILLS for any discharge medications will be authorized once you are discharged, as it is imperative that you return to your primary care physician (or establish a relationship with a primary care physician if you do not have one) for your aftercare needs so that they can reassess your need for medications and monitor your lab values. If you do not have a primary care physician, you can call [phone] for a physician referral.   Increase activity slowly   Complete by: As directed        Discharge Exam:   Vitals:   10/05/24 1632 10/05/24 2048 10/06/24 0554 10/06/24 0809  BP: 109/61 (!) 110/49 105/61 (!) 143/60  Pulse: 83 80  75  Resp: 16 19 18    Temp: 98.3 F (36.8 C) 98.1 F (36.7 C) 98.2 F (36.8 C) 98.2 F (36.8 C)  TempSrc: Oral     SpO2: 97% 95%  97%  Weight:      Height:        Body mass index is 31.03 kg/m.   General exam: Pleasant, elderly Caucasian female. Not in distress. Skin: No rashes, lesions or ulcers. HEENT:  Atraumatic, normocephalic, no obvious bleeding Lungs: Clear to auscultation bilaterally,  CVS: S1, S2, no murmur,   GI/Abd: Soft, nontender, nondistended, bowel sound present,   CNS: Alert, awake, slow to respond oriented to place and person Psychiatry: Calm and cooperative this morning Extremities: No pedal edema, no calf tenderness,    The results of significant diagnostics from this hospitalization (including imaging, microbiology, ancillary and laboratory) are listed below for reference.    Procedures and Diagnostic Studies:   DG Lumbar Spine 2-3 Views Result Date: 09/22/2024 CLINICAL DATA:  Fall EXAM: LUMBAR SPINE - 2-3 VIEW COMPARISON:  None Available. FINDINGS: The bones are osteopenic. No acute fracture or dislocation identified. Alignment is anatomic. There is mild disc space narrowing and endplate osteophyte formation throughout the lumbar spine compatible with degenerative change. There severe atherosclerotic calcifications of the aorta. IMPRESSION: 1. No acute fracture or dislocation. 2. Mild degenerative changes. Electronically Signed   By: Greig Pique M.D.   On: 09/22/2024 18:46   CT Head Wo Contrast Result Date: 09/21/2024 EXAM: CT HEAD AND CERVICAL SPINE 09/21/2024 08:26:00 PM TECHNIQUE: CT of the head and cervical spine was performed without the administration of intravenous contrast. Multiplanar reformatted images are provided for review. Automated exposure control, iterative reconstruction, and/or weight based adjustment of the mA/kV was utilized to reduce the radiation dose to as low as reasonably achievable. COMPARISON: 09/26/2023. CLINICAL HISTORY: Mental status change, unknown cause. FINDINGS: CT HEAD BRAIN AND VENTRICLES: Periventricular white matter small vessel ischemic changes. Age-related involutional changes. No acute intracranial hemorrhage. No mass effect or midline shift. No abnormal extra-axial fluid collection. No evidence of acute infarct. No hydrocephalus.  ORBITS: No acute abnormality. SINUSES AND MASTOIDS: No acute abnormality. SOFT TISSUES AND SKULL: No acute skull fracture. No acute soft tissue abnormality. 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 No acute fracture or traumatic malalignment. DEGENERATIVE CHANGES: Extensive degenerative disc disease with anterior and posterior projecting osteophyte formation and disc space narrowing at  each cervical level. SOFT TISSUES: No prevertebral soft tissue swelling. IMPRESSION: 1. No acute intracranial abnormality. 2. Periventricular white matter small vessel ischemic changes and age-related involutional changes. 3. No acute traumatic abnormalities of the cervical spine. 4. Extensive cervical degenerative disc disease with anterior and posterior osteophyte formation and disc space narrowing at each level, with osteoarthritis at C1C2. Electronically signed by: Fonda Field MD 09/21/2024 08:50 PM EST RP Workstation: [id number]   CT Cervical Spine Wo Contrast Result Date: 09/21/2024 EXAM: CT HEAD AND CERVICAL SPINE 09/21/2024 08:26:00 PM TECHNIQUE: CT of the head and cervical spine was performed without the administration of intravenous contrast. Multiplanar reformatted images are provided for review. Automated exposure control, iterative reconstruction, and/or weight based adjustment of the mA/kV was utilized to reduce the radiation dose to as low as reasonably achievable. COMPARISON: 09/26/2023. CLINICAL HISTORY: Mental status change, unknown cause. FINDINGS: CT HEAD BRAIN AND VENTRICLES: Periventricular white matter small vessel ischemic changes. Age-related involutional changes. No acute intracranial hemorrhage. No mass effect or midline shift. No abnormal extra-axial fluid collection. No evidence of acute infarct. No hydrocephalus. ORBITS: No acute abnormality. SINUSES AND MASTOIDS: No acute abnormality. SOFT TISSUES AND SKULL: No acute skull fracture. No acute soft tissue abnormality. 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 No acute fracture or traumatic malalignment. DEGENERATIVE CHANGES: Extensive degenerative disc disease with anterior and posterior projecting osteophyte formation and disc space narrowing at each cervical level. SOFT TISSUES: No prevertebral soft tissue swelling. IMPRESSION: 1. No acute intracranial abnormality. 2. Periventricular white matter small vessel ischemic changes and age-related involutional changes. 3. No acute traumatic abnormalities of the cervical spine. 4. Extensive cervical degenerative disc disease with anterior and posterior osteophyte formation and disc space narrowing at each level, with osteoarthritis at C1C2. Electronically signed by: Fonda Field MD 09/21/2024 08:50 PM EST RP Workstation: [id number]   DG Chest Portable 1 View Result Date: 09/21/2024 CLINICAL DATA:  Altered mental status. EXAM: PORTABLE CHEST 1 VIEW COMPARISON:  September 26, 2023 FINDINGS: The heart size and mediastinal contours are within normal limits. There is marked severity calcification of the aortic arch. The lungs are hyperinflated with mild, diffuse, chronic appearing increased interstitial lung markings. A stable 11 mm pulmonary nodule is seen within the mid left lung. No pleural effusion or pneumothorax is identified. The visualized skeletal structures are unremarkable. IMPRESSION: No acute or active cardiopulmonary disease. Electronically Signed   By: Suzen Dials M.D.   On: 09/21/2024 20:14     Labs:   Basic Metabolic Panel: Recent Labs  Lab 10/01/24 0433  NA 135  K 4.3  CL 102  CO2 26  GLUCOSE 134*  BUN 22  CREATININE 1.14*  CALCIUM  9.3  MG 2.1  PHOS 2.7   GFR Estimated Creatinine Clearance: 45.3 mL/min (A) (by C-G formula based on SCr of 1.14 mg/dL (H)). Liver Function Tests: Recent Labs  Lab 10/01/24 0433  AST 17  ALT 13  ALKPHOS 69  BILITOT 0.6  PROT 5.1*  ALBUMIN 2.6*   No results for input(s):  LIPASE, AMYLASE in the last 168 hours. No results for input(s): AMMONIA in the last 168 hours. Coagulation profile No results for input(s): INR, PROTIME in the last 168 hours.  CBC: Recent Labs  Lab 10/01/24 0433  WBC 6.9  HGB 15.1*  HCT 46.4*  MCV 89.9  PLT 198   Cardiac Enzymes: No results for input(s): CKTOTAL, CKMB, CKMBINDEX, TROPONINI in the last 168 hours. BNP: Invalid input(s): POCBNP CBG: Recent  Labs  Lab 10/04/24 2045 10/05/24 0726 10/05/24 1713 10/05/24 2044 10/06/24 0811  GLUCAP 132* 102* 125* 233* 118*   D-Dimer No results for input(s): DDIMER in the last 72 hours. Hgb A1c No results for input(s): HGBA1C in the last 72 hours. Lipid Profile No results for input(s): CHOL, HDL, LDLCALC, TRIG, CHOLHDL, LDLDIRECT in the last 72 hours. Thyroid  function studies No results for input(s): TSH, T4TOTAL, T3FREE, THYROIDAB in the last 72 hours.  Invalid input(s): FREET3 Anemia work up No results for input(s): VITAMINB12, FOLATE, FERRITIN, TIBC, IRON, RETICCTPCT in the last 72 hours. Microbiology No results found for this or any previous visit (from the past 240 hours).  Time coordinating discharge: 45 minutes  Signed: Mildreth Reek  [facility] 10/06/2024, 11:54 AM

## 2024-10-06 NOTE — TOC Progression Note (Signed)
 Transition of Care [facility]) - Progression Note    Patient Details  Name: Lynn [name] MRN: [id number] Date of Birth: Jul 02, 1946  [facility] CM/SW Contact  Luise JAYSON Pan, [place] Phone Number: 10/06/2024, 10:39 AM  Clinical Narrative:   Per RNCM, MD stated patient is ready for discharge. CSW followed up with patients cousin, Zebedee, in regards to patient discharging today. Zebedee is ready for patient to DC.   CSW reached out to Kia with [facility] about patient discharging to facility today. Per Kia, she is off the day but patient will need to be reviewed before discharging to [facility]. Kia stated no one at [facility] is available to review patient today. CSW spoke with facility RN Liaison, Jon, about patient discharging today. Jon stated that she will reached out to [place] with their sister facilities to come by and review patient. CSW notified Zebedee.   CSW will continue to follow.    Expected Discharge Plan: Skilled Nursing Facility Barriers to Discharge: Barriers Resolved               Expected Discharge Plan and Services       Living arrangements for the past 2 months: Single Family Home Expected Discharge Date: 09/26/24                                     Social Drivers of Health (SDOH) Interventions SDOH Screenings   Food Insecurity: Patient Unable To Answer (09/22/2024)  Housing: Low Risk  (09/23/2024)  Transportation Needs: Patient Unable To Answer (09/22/2024)  Utilities: Patient Unable To Answer (09/22/2024)  Alcohol Screen: Low Risk  (03/06/2024)  Depression (PHQ2-9): Low Risk  (03/06/2024)  Financial Resource Strain: Low Risk  (03/06/2024)  Physical Activity: Inactive (03/06/2024)  Social Connections: Unknown (09/22/2024)  Stress: Stress Concern Present (03/06/2024)  Tobacco Use: Medium Risk (09/21/2024)  Health Literacy: Adequate Health Literacy (03/06/2024)    Readmission Risk Interventions    09/28/2023    2:44 PM  Readmission Risk  Prevention Plan  Post Dischage Appt Complete  Medication Screening Complete  Transportation Screening Complete

## 2024-10-06 NOTE — TOC Transition Note (Signed)
 Transition of Care [facility]) - Discharge Note   Patient Details  Name: Lynn [name] MRN: [id number] Date of Birth: [birth year]  Transition of Care [facility]) CM/SW Contact:  Luise JAYSON Pan, LCSWA Phone Number: 10/06/2024, 2:36 PM   Clinical Narrative:   Patient will DC to: [facility] SNF Anticipated DC date: 10/06/24  Family notified: Zebedee (cousin), [phone] (Mobile)  Transport by: [facility]   Per MD patient ready for DC to [facility]. RN to call report prior to discharge (([phone]). RN, patient, patient's family, and facility notified of DC. Discharge Summary and FL2 sent to facility. DC packet on chart. Ambulance transport requested for patient 2:33 pm.   CSW will sign off for now as social work intervention is no longer needed. Please consult us  again if new needs arise.      Final next level of care: Skilled Nursing Facility Barriers to Discharge: Barriers Resolved   Patient Goals and CMS Choice Patient states their goals for this hospitalization and ongoing recovery are:: SNF          Discharge Placement   Existing PASRR number confirmed : 09/26/24          Patient chooses bed at: [facility] Patient to be transferred to facility by: [facility] Name of family member notified: Zebedee [phone] [facility]) Patient and family notified of of transfer: 10/06/24  Discharge Plan and Services Additional resources added to the After Visit Summary for                                       Social Drivers of Health (SDOH) Interventions SDOH Screenings   Food Insecurity: Patient Unable To Answer (09/22/2024)  Housing: Low Risk  (09/23/2024)  Transportation Needs: Patient Unable To Answer (09/22/2024)  Utilities: Patient Unable To Answer (09/22/2024)  Alcohol Screen: Low Risk  (03/06/2024)  Depression (PHQ2-9): Low Risk  (03/06/2024)  Financial Resource Strain: Low Risk  (03/06/2024)  Physical Activity: Inactive (03/06/2024)   Social Connections: Unknown (09/22/2024)  Stress: Stress Concern Present (03/06/2024)  Tobacco Use: Medium Risk (09/21/2024)  Health Literacy: Adequate Health Literacy (03/06/2024)     Readmission Risk Interventions    09/28/2023    2:44 PM  Readmission Risk Prevention Plan  Post Dischage Appt Complete  Medication Screening Complete  Transportation Screening Complete

## 2024-10-06 NOTE — Plan of Care (Signed)
  Problem: Pain Managment: Goal: General experience of comfort will improve and/or be controlled Outcome: Progressing   Problem: Safety: Goal: Ability to remain free from injury will improve Outcome: Progressing

## 2024-10-09 ENCOUNTER — Telehealth: Admitting: Nurse Practitioner

## 2025-03-09 ENCOUNTER — Ambulatory Visit
